# Patient Record
Sex: Male | Born: 1945
Health system: Southern US, Community
[De-identification: ages and names within clinical notes are randomized; demographics above are authoritative.]

## PROBLEM LIST (undated history)

## (undated) DIAGNOSIS — K219 Gastro-esophageal reflux disease without esophagitis: Secondary | ICD-10-CM

## (undated) DIAGNOSIS — M549 Dorsalgia, unspecified: Secondary | ICD-10-CM

## (undated) DIAGNOSIS — E43 Unspecified severe protein-calorie malnutrition: Secondary | ICD-10-CM

## (undated) DIAGNOSIS — G8929 Other chronic pain: Secondary | ICD-10-CM

## (undated) DIAGNOSIS — K269 Duodenal ulcer, unspecified as acute or chronic, without hemorrhage or perforation: Secondary | ICD-10-CM

## (undated) DIAGNOSIS — A048 Other specified bacterial intestinal infections: Secondary | ICD-10-CM

## (undated) DIAGNOSIS — IMO0002 Reserved for concepts with insufficient information to code with codable children: Secondary | ICD-10-CM

## (undated) DIAGNOSIS — C159 Malignant neoplasm of esophagus, unspecified: Secondary | ICD-10-CM

## (undated) DIAGNOSIS — I10 Essential (primary) hypertension: Secondary | ICD-10-CM

## (undated) HISTORY — DX: Reserved for concepts with insufficient information to code with codable children: IMO0002

## (undated) HISTORY — PX: REMOVAL OF GASTROSTOMY TUBE: SHX6058

## (undated) HISTORY — DX: Malignant neoplasm of esophagus, unspecified: C15.9

## (undated) HISTORY — DX: Other chronic pain: G89.29

## (undated) HISTORY — DX: Dorsalgia, unspecified: M54.9

## (undated) HISTORY — PX: PORTACATH PLACEMENT: SHX2246

---

## 2001-11-27 ENCOUNTER — Encounter: Payer: Self-pay | Admitting: Emergency Medicine

## 2001-11-27 ENCOUNTER — Emergency Department (HOSPITAL_COMMUNITY): Admission: EM | Admit: 2001-11-27 | Discharge: 2001-11-27 | Payer: Self-pay | Admitting: Emergency Medicine

## 2012-07-16 ENCOUNTER — Encounter (HOSPITAL_COMMUNITY): Payer: Self-pay | Admitting: *Deleted

## 2012-07-16 ENCOUNTER — Emergency Department (HOSPITAL_COMMUNITY)
Admission: EM | Admit: 2012-07-16 | Discharge: 2012-07-16 | Disposition: A | Discharge status: Home / Self Care | Payer: Medicare Other | Attending: Emergency Medicine | Admitting: Emergency Medicine

## 2012-07-16 ENCOUNTER — Emergency Department (HOSPITAL_COMMUNITY): Payer: Medicare Other

## 2012-07-16 DIAGNOSIS — S27819A Unspecified injury of esophagus (thoracic part), initial encounter: Secondary | ICD-10-CM

## 2012-07-16 DIAGNOSIS — I10 Essential (primary) hypertension: Secondary | ICD-10-CM | POA: Insufficient documentation

## 2012-07-16 DIAGNOSIS — R05 Cough: Secondary | ICD-10-CM | POA: Insufficient documentation

## 2012-07-16 DIAGNOSIS — F17200 Nicotine dependence, unspecified, uncomplicated: Secondary | ICD-10-CM | POA: Insufficient documentation

## 2012-07-16 DIAGNOSIS — R059 Cough, unspecified: Secondary | ICD-10-CM | POA: Insufficient documentation

## 2012-07-16 DIAGNOSIS — T07XXXA Unspecified multiple injuries, initial encounter: Secondary | ICD-10-CM | POA: Insufficient documentation

## 2012-07-16 DIAGNOSIS — X58XXXA Exposure to other specified factors, initial encounter: Secondary | ICD-10-CM | POA: Insufficient documentation

## 2012-07-16 HISTORY — DX: Essential (primary) hypertension: I10

## 2012-07-16 MED ORDER — FAMOTIDINE 20 MG PO TABS: 20.0000 mg | ORAL_TABLET | Freq: Two times a day (BID) | ORAL | Status: DC

## 2012-07-16 MED ORDER — AZITHROMYCIN 250 MG PO TABS: ORAL_TABLET | ORAL | Status: DC

## 2012-07-16 MED ORDER — PANTOPRAZOLE SODIUM 40 MG PO TBEC
40.0000 mg | DELAYED_RELEASE_TABLET | Freq: Once | ORAL | Status: AC
  Administered 2012-07-16: 40 mg via ORAL
  Filled 2012-07-16: qty 1

## 2012-07-16 NOTE — ED Notes (Signed)
Cough, congestion for 2 weeks,  Had a piece of meat hung in throat today, started coughing and "coughed up blood".

## 2012-07-21 NOTE — ED Provider Notes (Signed)
History     CSN: [id number]  Arrival date & time 07/16/12  1433   First MD Initiated Contact with Patient 07/16/12 1548      Chief Complaint  Patient presents with  . Cough    (Consider location/radiation/quality/duration/timing/severity/associated sxs/prior treatment) HPI Comments: Patient c/o intermittent cough and chest congestion for 2 weeks.  States cough is occasionally productive of yellowish sputum.  He also states that earlier on the day of arrival,  He was eating a piece of meat when he began coughing and the meat became lodged in his throat.  States he was able to finally swallow it but noticed a brief episode of bright red blood during the coughing.  He denies previous hemoptysis, fever, chills, shortness of breath,  night sweats or wt loss.  Also denies abd or chest pain.    Patient is a 66 y.o. male presenting with cough. The history is provided by the patient.  Cough This is a new problem. The current episode started more than 1 week ago. The problem occurs every few hours. The problem has not changed since onset.The cough is productive of sputum. There has been no fever. Associated symptoms include rhinorrhea. Pertinent negatives include no chest pain, no chills, no sweats, no ear congestion, no ear pain, no headaches, no sore throat, no myalgias, no shortness of breath, no wheezing and no eye redness. He has tried nothing for the symptoms. The treatment provided no relief. He is a smoker. His past medical history does not include bronchitis, pneumonia, emphysema or asthma.    Past Medical History  Diagnosis Date  . Hypertension     History reviewed. No pertinent past surgical history.  History reviewed. No pertinent family history.  History  Substance Use Topics  . Smoking status: Current Everyday Smoker  . Smokeless tobacco: Not on file  . Alcohol Use: No      Review of Systems  Constitutional: Negative for fever, chills, activity change and appetite change.   HENT: Positive for rhinorrhea. Negative for ear pain, sore throat, trouble swallowing, neck pain, neck stiffness and voice change.   Eyes: Negative for redness.  Respiratory: Positive for cough. Negative for chest tightness, shortness of breath, wheezing and stridor.   Cardiovascular: Negative for chest pain.  Gastrointestinal: Negative for nausea, vomiting and abdominal pain.  Musculoskeletal: Negative for myalgias.  Skin: Negative.   Neurological: Negative for dizziness, weakness, numbness and headaches.  All other systems reviewed and are negative.    Allergies  Motrin  Home Medications   Current Outpatient Rx  Name Route Sig Dispense Refill  . AZITHROMYCIN 250 MG PO TABS  Take two tablets on day one, then one tab qd days 2-5 6 tablet 0  . FAMOTIDINE 20 MG PO TABS Oral Take 1 tablet (20 mg total) by mouth 2 (two) times daily. For 5 days 10 tablet 0    BP 143/80  Pulse 109  Temp 98.5 F (36.9 C) (Oral)  Resp 20  Ht 5\' 6"  (1.676 m)  Wt 116 lb 1 oz (52.646 kg)  BMI 18.73 kg/m2  SpO2 100%  Physical Exam  Nursing note and vitals reviewed. Constitutional: He is oriented to person, place, and time. He appears well-developed and well-nourished. No distress.  HENT:  Head: Normocephalic and atraumatic.  Right Ear: Tympanic membrane and ear canal normal.  Left Ear: Tympanic membrane and ear canal normal.  Mouth/Throat: Uvula is midline, oropharynx is clear and moist and mucous membranes are normal.  Neck: Normal range  of motion. Neck supple.  Cardiovascular: Normal rate, regular rhythm, normal heart sounds and intact distal pulses.   No murmur heard. Pulmonary/Chest: Effort normal and breath sounds normal. No respiratory distress. He has no wheezes. He has no rales. He exhibits no tenderness.  Abdominal: Soft. Bowel sounds are normal. He exhibits no distension. There is no tenderness.  Musculoskeletal: He exhibits no edema.  Neurological: He is alert and oriented to person,  place, and time. Coordination normal.  Skin: Skin is warm and dry.    ED Course  Procedures (including critical care time)  Labs Reviewed - No data to display No results found.   1. Cough   2. Esophageal injury       MDM   Pt is resting comfortably. NAD.  Vitals stable.  Probable spontaneous resolution of food bolus PTA to ED.  Pt now able to drink fluids w/o difficulty , no vomiting.  Pt is well appearing.     I have discussed pt hx and care plan with EDP prior to d/c.  Pt agrees to return to ED if sx's worsen or to f/u with his PMD  The patient appears reasonably screened and/or stabilized for discharge and I doubt any other medical condition or other [facility] requiring further screening, evaluation, or treatment in the ED at this time prior to discharge.     Madina Galati L. [place], [place] 07/21/12 2201

## 2012-07-22 NOTE — ED Provider Notes (Signed)
Medical screening examination/treatment/procedure(s) were performed by non-physician practitioner and as supervising physician I was immediately available for consultation/collaboration.   Shelda Jakes, MD 07/22/12 [phone]

## 2012-07-27 ENCOUNTER — Ambulatory Visit (INDEPENDENT_AMBULATORY_CARE_PROVIDER_SITE_OTHER): Payer: Medicare Other | Admitting: Family Medicine

## 2012-07-27 ENCOUNTER — Encounter: Payer: Self-pay | Admitting: Family Medicine

## 2012-07-27 VITALS — BP 150/90 | HR 80 | Resp 16 | Ht 66.0 in | Wt 117.8 lb

## 2012-07-27 DIAGNOSIS — I10 Essential (primary) hypertension: Secondary | ICD-10-CM

## 2012-07-27 DIAGNOSIS — R1310 Dysphagia, unspecified: Secondary | ICD-10-CM

## 2012-07-27 DIAGNOSIS — J438 Other emphysema: Secondary | ICD-10-CM

## 2012-07-27 DIAGNOSIS — Z720 Tobacco use: Secondary | ICD-10-CM

## 2012-07-27 DIAGNOSIS — Z7289 Other problems related to lifestyle: Secondary | ICD-10-CM

## 2012-07-27 DIAGNOSIS — J439 Emphysema, unspecified: Secondary | ICD-10-CM

## 2012-07-27 DIAGNOSIS — F17200 Nicotine dependence, unspecified, uncomplicated: Secondary | ICD-10-CM

## 2012-07-27 DIAGNOSIS — Z789 Other specified health status: Secondary | ICD-10-CM

## 2012-07-27 MED ORDER — OMEPRAZOLE 20 MG PO CPDR: 20.0000 mg | DELAYED_RELEASE_CAPSULE | Freq: Every day | ORAL | Status: DC

## 2012-07-27 NOTE — Progress Notes (Signed)
  Subjective:    Patient ID: Eddie [name], male    DOB: 1946/07/03, 66 y.o.   MRN: [id number]  HPI Patient presents to establish care. His last PCP was Dr. Sudie Bailey but this was greater than 6 years ago. He was seen in the emergency room secondary to cough and hemoptysis also difficulty swallowing. He was prescribed Z-Pak for cough as x-ray indicated emphysema changes. He was also given Pepcid. He has been on a soft diet for the past few days which hasn't proved his swallowing. He denies any further hemoptysis. He does continue to have difficulty with swallowing especially meats.    Review of Systems   GEN- denies fatigue, fever, weight loss,weakness, recent illness HEENT- denies eye drainage, change in vision, nasal discharge, CVS- denies chest pain, palpitations RESP- denies SOB, cough, wheeze ABD- denies N/V, change in stools, abd pain GU- denies dysuria, hematuria, dribbling, incontinence MSK- denies joint pain, muscle aches, injury Neuro- denies headache, dizziness, syncope, seizure activity      Objective:   Physical Exam GEN- NAD, alert and oriented x3, thin male HEENT- PERRL, EOMI, non injected sclera, pink conjunctiva, MMM, oropharynx clear Neck- Supple CVS- RRR, no murmur RESP-CTAB ABD-NABS,soft,NT,ND EXT- No edema Pulses- Radial, DP- 2+ Skin- dime size inclusion cyst between brow Psych-normal affect and mood        Assessment & Plan:

## 2012-07-27 NOTE — Patient Instructions (Addendum)
Get the labs done next week, do not eat after midnight Take the acid blocker once a day  Referral to gastroenterologist for your swallowing  We will call with results of your test  F/U  6 weeks for blood pressure

## 2012-07-29 ENCOUNTER — Encounter: Payer: Self-pay | Admitting: Family Medicine

## 2012-07-29 DIAGNOSIS — R1310 Dysphagia, unspecified: Secondary | ICD-10-CM | POA: Insufficient documentation

## 2012-07-29 DIAGNOSIS — Z720 Tobacco use: Secondary | ICD-10-CM | POA: Insufficient documentation

## 2012-07-29 DIAGNOSIS — I10 Essential (primary) hypertension: Secondary | ICD-10-CM | POA: Insufficient documentation

## 2012-07-29 DIAGNOSIS — Z789 Other specified health status: Secondary | ICD-10-CM | POA: Insufficient documentation

## 2012-07-29 DIAGNOSIS — Z7289 Other problems related to lifestyle: Secondary | ICD-10-CM | POA: Insufficient documentation

## 2012-07-29 DIAGNOSIS — J439 Emphysema, unspecified: Secondary | ICD-10-CM | POA: Insufficient documentation

## 2012-07-29 NOTE — Assessment & Plan Note (Signed)
Will monitor, no meds currently

## 2012-07-29 NOTE — Assessment & Plan Note (Signed)
Referral to GI, continue PPI

## 2012-07-29 NOTE — Assessment & Plan Note (Signed)
I am concerned he is not letting on to how much alcohol he really drinks, very thin male, he cares for his wife as well

## 2012-07-29 NOTE — Assessment & Plan Note (Signed)
Will need PFT done, discussed importance of smoking cessation, no meds at this time

## 2012-07-30 LAB — CBC WITH DIFFERENTIAL/PLATELET
Basophils Absolute: 0.1 10*3/uL (ref 0.0–0.1)
Basophils Relative: 1 % (ref 0–1)
Eosinophils Absolute: 0.2 10*3/uL (ref 0.0–0.7)
Eosinophils Relative: 1 % (ref 0–5)
HCT: 42.3 % (ref 39.0–52.0)
Hemoglobin: 14 g/dL (ref 13.0–17.0)
Lymphocytes Relative: 21 % (ref 12–46)
Lymphs Abs: 2.7 10*3/uL (ref 0.7–4.0)
MCH: 31.7 pg (ref 26.0–34.0)
MCHC: 33.1 g/dL (ref 30.0–36.0)
MCV: 95.7 fL (ref 78.0–100.0)
Monocytes Absolute: 0.9 10*3/uL (ref 0.1–1.0)
Monocytes Relative: 7 % (ref 3–12)
Neutro Abs: 9 10*3/uL — ABNORMAL HIGH (ref 1.7–7.7)
Neutrophils Relative %: 70 % (ref 43–77)
Platelets: 322 10*3/uL (ref 150–400)
RBC: 4.42 MIL/uL (ref 4.22–5.81)
RDW: 12.1 % (ref 11.5–15.5)
WBC: 12.9 10*3/uL — ABNORMAL HIGH (ref 4.0–10.5)

## 2012-07-30 LAB — COMPREHENSIVE METABOLIC PANEL
ALT: <8 U/L (ref 0–53)
AST: 11 U/L (ref 0–37)
Albumin: 4.2 g/dL (ref 3.5–5.2)
Alkaline Phosphatase: 70 U/L (ref 39–117)
BUN: 13 mg/dL (ref 6–23)
CO2: 24 mEq/L (ref 19–32)
Calcium: 9.8 mg/dL (ref 8.4–10.5)
Chloride: 108 mEq/L (ref 96–112)
Creat: 1.12 mg/dL (ref 0.50–1.35)
Glucose, Bld: 93 mg/dL (ref 70–99)
Potassium: 4.3 mEq/L (ref 3.5–5.3)
Sodium: 143 mEq/L (ref 135–145)
Total Bilirubin: 1.6 mg/dL — ABNORMAL HIGH (ref 0.3–1.2)
Total Protein: 6.9 g/dL (ref 6.0–8.3)

## 2012-07-30 LAB — LIPID PANEL
Cholesterol: 148 mg/dL (ref 0–200)
HDL: 54 mg/dL (ref >39)
LDL Cholesterol: 76 mg/dL (ref 0–99)
Total CHOL/HDL Ratio: 2.7 Ratio
Triglycerides: 91 mg/dL (ref <150)
VLDL: 18 mg/dL (ref 0–40)

## 2012-08-22 ENCOUNTER — Encounter (INDEPENDENT_AMBULATORY_CARE_PROVIDER_SITE_OTHER): Payer: Self-pay | Admitting: Internal Medicine

## 2012-08-22 ENCOUNTER — Ambulatory Visit (INDEPENDENT_AMBULATORY_CARE_PROVIDER_SITE_OTHER): Payer: Medicare Other | Admitting: Internal Medicine

## 2012-08-22 ENCOUNTER — Other Ambulatory Visit (INDEPENDENT_AMBULATORY_CARE_PROVIDER_SITE_OTHER): Payer: Self-pay | Admitting: *Deleted

## 2012-08-22 ENCOUNTER — Encounter (INDEPENDENT_AMBULATORY_CARE_PROVIDER_SITE_OTHER): Payer: Self-pay | Admitting: *Deleted

## 2012-08-22 VITALS — BP 122/74 | HR 56 | Temp 98.3°F | Ht 66.0 in | Wt 116.3 lb

## 2012-08-22 DIAGNOSIS — R1310 Dysphagia, unspecified: Secondary | ICD-10-CM

## 2012-08-22 MED ORDER — OMEPRAZOLE 40 MG PO CPDR: 40.0000 mg | DELAYED_RELEASE_CAPSULE | Freq: Every day | ORAL | Status: DC

## 2012-08-22 NOTE — Progress Notes (Signed)
Subjective:     Patient ID: Eddie [name], male   DOB: October 10, 1946, 66 y.o.   MRN: [id number]  HPILeon is a 66 yr old male presenting with c/o dysphagia. He tells me about 3 weeks ago he was eating meat and potatoes and the food lodged. He had to cough the food up. He says he also saw blood during this incidence. No blood since. No problems with liquids.  He says he is having problems with all foods going down.  Foods are slow to go down. Symptoms x  6 months.  Appetite is good. Weight loss of 20 pounds over 6 months period.  BM x 1 a day, sometimes  2 a day. No melena or bright red rectal bleeding.  Review of Systemssee hpi Current Outpatient Prescriptions  Medication Sig Dispense Refill  . omeprazole (PRILOSEC) 20 MG capsule Take 1 capsule (20 mg total) by mouth daily.  30 capsule  2   Past Medical History  Diagnosis Date  . Hypertension   . Chronic back pain    History reviewed. No pertinent past surgical history. Family Status  Relation Status Death Age  . Mother Deceased   . Father Alive   . Sister Alive   . Brother Alive   . Sister Alive   . Sister Alive   . Sister Alive   . Brother Deceased     Anuserym   History   Social History  . Marital Status: Married    Spouse Name: N/A    Number of Children: N/A  . Years of Education: N/A   Occupational History  . Not on file.   Social History Main Topics  . Smoking status: Current Every Day Smoker -- 0.5 packs/day  . Smokeless tobacco: Not on file   Comment: 1/2 to 1 pack x50 yrs.  . Alcohol Use: 3.6 oz/week    6 Shots of liquor per week     twice a week  . Drug Use: No  . Sexually Active: Not on file   Other Topics Concern  . Not on file   Social History Narrative  . No narrative on file   Allergies  Allergen Reactions  . Motrin (Ibuprofen)         Objective:   Physical Exam Filed Vitals:   08/22/12 1005  BP: 122/74  Pulse: 56  Temp: 98.3 F (36.8 C)  Height: 5\' 6"  (1.676 m)  Weight: 116 lb 4.8 oz  (52.753 kg)   Alert and oriented. Skin warm and dry. Oral mucosa is moist.   . Sclera anicteric, conjunctivae is pink. Thyroid not enlarged. No cervical lymphadenopathy. Lungs clear. Heart regular rate and rhythm.  Abdomen is soft. Bowel sounds are positive. No hepatomegaly. No abdominal masses felt. No tenderness.  No edema to lower extremities.       Assessment:    Solid food dysphagia. Esophageal stricture needs to be ruled out.    Plan:     EGD/ED with Dr. Karilyn Cota. The risks and benefits such as perforation, bleeding, and infection were reviewed with the patient and is agreeable.    Omeprazole 40mg  daily 30 minutes before breakfast.  NO meats or breads.  Try to stay on a soft diet.

## 2012-08-22 NOTE — Patient Instructions (Addendum)
EGD/ED. The risks and benefits such as perforation, bleeding, and infection were reviewed with the patient and is agreeable. 

## 2012-09-04 DIAGNOSIS — A048 Other specified bacterial intestinal infections: Secondary | ICD-10-CM

## 2012-09-04 DIAGNOSIS — K269 Duodenal ulcer, unspecified as acute or chronic, without hemorrhage or perforation: Secondary | ICD-10-CM

## 2012-09-04 HISTORY — DX: Duodenal ulcer, unspecified as acute or chronic, without hemorrhage or perforation: K26.9

## 2012-09-04 HISTORY — DX: Other specified bacterial intestinal infections: A04.8

## 2012-09-07 ENCOUNTER — Ambulatory Visit (INDEPENDENT_AMBULATORY_CARE_PROVIDER_SITE_OTHER): Payer: Medicare Other | Admitting: Family Medicine

## 2012-09-07 ENCOUNTER — Encounter: Payer: Self-pay | Admitting: Family Medicine

## 2012-09-07 VITALS — BP 148/98 | HR 72 | Resp 15 | Ht 66.0 in | Wt 112.1 lb

## 2012-09-07 DIAGNOSIS — I10 Essential (primary) hypertension: Secondary | ICD-10-CM

## 2012-09-07 MED ORDER — LISINOPRIL-HYDROCHLOROTHIAZIDE 10-12.5 MG PO TABS: 1.0000 | ORAL_TABLET | Freq: Every day | ORAL | Status: DC

## 2012-09-07 NOTE — Patient Instructions (Signed)
Flu shot given  Start new blood pressure medication once a day  F/U for your endoscopy F/U 2 months

## 2012-09-07 NOTE — Progress Notes (Signed)
  Subjective:    Patient ID: Eddie [name], male    DOB: 09-17-1946, 66 y.o.   MRN: [id number]  HPI Patient here to Followup hypertension. He was previously on blood pressure medication some years ago. He's been evaluated by GI for his dysphasia or weight loss. He still unable to eat very many foods. He is EGD scheduled for next Thursday.  He would like to have his dysphasia evaluated before having any other workup done Review of Systems  GEN- denies fatigue, fever, weight loss,weakness, recent illness HEENT- denies eye drainage, change in vision, nasal discharge, CVS- denies chest pain, palpitations RESP- denies SOB, cough, wheeze ABD- denies N/V, change in stools, abd pain Neuro- denies headache, dizziness, syncope, seizure activity      Objective:   Physical Exam  GEN- NAD, alert and oriented x3, thin male HEENT- PERRL, EOMI, non injected sclera, pink conjunctiva, MMM, oropharynx clear CVS- RRR, no murmur RESP-CTAB EXT- No edema Pulses- Radial 2+      Assessment & Plan:

## 2012-09-07 NOTE — Assessment & Plan Note (Signed)
Start HCTZ lisinopril, labs reviewed, FLP looks good, normal renal function

## 2012-09-10 ENCOUNTER — Encounter (HOSPITAL_COMMUNITY): Payer: Self-pay | Admitting: Pharmacy Technician

## 2012-09-12 MED ORDER — SODIUM CHLORIDE 0.45 % IV SOLN
INTRAVENOUS | Status: DC
  Administered 2012-09-13: 13:00:00 via INTRAVENOUS

## 2012-09-13 ENCOUNTER — Other Ambulatory Visit (INDEPENDENT_AMBULATORY_CARE_PROVIDER_SITE_OTHER): Payer: Self-pay | Admitting: Internal Medicine

## 2012-09-13 ENCOUNTER — Encounter (HOSPITAL_COMMUNITY): Payer: Self-pay | Admitting: *Deleted

## 2012-09-13 ENCOUNTER — Ambulatory Visit (HOSPITAL_COMMUNITY): Payer: Medicare Other

## 2012-09-13 ENCOUNTER — Encounter (HOSPITAL_COMMUNITY)
Admission: RE | Disposition: A | Discharge status: Home / Self Care | Payer: Self-pay | Source: Ambulatory Visit | Attending: Internal Medicine

## 2012-09-13 ENCOUNTER — Ambulatory Visit (HOSPITAL_COMMUNITY)
Admission: RE | Admit: 2012-09-13 | Discharge: 2012-09-13 | Disposition: A | Discharge status: Home / Self Care | Payer: Medicare Other | Source: Ambulatory Visit | Attending: Internal Medicine | Admitting: Internal Medicine

## 2012-09-13 DIAGNOSIS — R042 Hemoptysis: Secondary | ICD-10-CM

## 2012-09-13 DIAGNOSIS — R634 Abnormal weight loss: Secondary | ICD-10-CM

## 2012-09-13 DIAGNOSIS — K228 Other specified diseases of esophagus: Secondary | ICD-10-CM

## 2012-09-13 DIAGNOSIS — C155 Malignant neoplasm of lower third of esophagus: Secondary | ICD-10-CM | POA: Insufficient documentation

## 2012-09-13 DIAGNOSIS — R1310 Dysphagia, unspecified: Secondary | ICD-10-CM

## 2012-09-13 DIAGNOSIS — K222 Esophageal obstruction: Secondary | ICD-10-CM

## 2012-09-13 DIAGNOSIS — Z79899 Other long term (current) drug therapy: Secondary | ICD-10-CM | POA: Insufficient documentation

## 2012-09-13 DIAGNOSIS — I10 Essential (primary) hypertension: Secondary | ICD-10-CM | POA: Insufficient documentation

## 2012-09-13 DIAGNOSIS — K2980 Duodenitis without bleeding: Secondary | ICD-10-CM

## 2012-09-13 DIAGNOSIS — R1906 Epigastric swelling, mass or lump: Secondary | ICD-10-CM

## 2012-09-13 HISTORY — DX: Gastro-esophageal reflux disease without esophagitis: K21.9

## 2012-09-13 SURGERY — ESOPHAGOGASTRODUODENOSCOPY (EGD) WITH ESOPHAGEAL DILATION
Anesthesia: Moderate Sedation

## 2012-09-13 MED ORDER — BUTAMBEN-TETRACAINE-BENZOCAINE 2-2-14 % EX AERO
INHALATION_SPRAY | CUTANEOUS | Status: DC | PRN
  Administered 2012-09-13: 2 via TOPICAL

## 2012-09-13 MED ORDER — MIDAZOLAM HCL 5 MG/5ML IJ SOLN
INTRAMUSCULAR | Status: DC | PRN
  Administered 2012-09-13: 1 mg via INTRAVENOUS
  Administered 2012-09-13 (×3): 2 mg via INTRAVENOUS

## 2012-09-13 MED ORDER — STERILE WATER FOR IRRIGATION IR SOLN
Status: DC | PRN
  Administered 2012-09-13: 14:00:00

## 2012-09-13 MED ORDER — IOHEXOL 300 MG/ML  SOLN
40.0000 mL | Freq: Once | INTRAMUSCULAR | Status: AC | PRN
  Administered 2012-09-13: 40 mL via INTRAVENOUS

## 2012-09-13 MED ORDER — MEPERIDINE HCL 25 MG/ML IJ SOLN
INTRAMUSCULAR | Status: DC | PRN
  Administered 2012-09-13 (×2): 25 mg via INTRAVENOUS

## 2012-09-13 MED ORDER — MEPERIDINE HCL 50 MG/ML IJ SOLN
INTRAMUSCULAR | Status: AC
  Filled 2012-09-13: qty 1

## 2012-09-13 MED ORDER — IOHEXOL 300 MG/ML  SOLN
100.0000 mL | Freq: Once | INTRAMUSCULAR | Status: AC | PRN
  Administered 2012-09-13: 100 mL via INTRAVENOUS

## 2012-09-13 MED ORDER — MIDAZOLAM HCL 5 MG/5ML IJ SOLN
INTRAMUSCULAR | Status: AC
  Filled 2012-09-13: qty 10

## 2012-09-13 NOTE — H&P (Signed)
Eddie [name] is an 66 y.o. male.   Chief Complaint: Patient is therefore EGD and ED. HPI: She is 66 year old African American male who presents with 2 month history of dysphagia to solids. He points to upper sternal/supra-clavicular area side abortus obstruction. He has no difficulty with liquids. He has been on PPI for the last few weeks and his heartburn is well controlled. He has good appetite but unable to swallow. He has lost 20 pounds since his dysphagia started. He denies abdominal pain melena or diarrhea. He does not take any NSAIDs. Drinks alcohol socially but not daily. He smokes pack of cigarettes per day.  Past Medical History  Diagnosis Date  . Hypertension   . Chronic back pain   . GERD (gastroesophageal reflux disease)     History reviewed. No pertinent past surgical history.  Family History  Problem Relation Age of Onset  . Stroke Mother   . Hypertension Father   . Cancer Brother     Throat cancer  . Kidney disease Daughter    Social History:  reports that he has been smoking.  He does not have any smokeless tobacco history on file. He reports that he drinks about 3.6 ounces of alcohol per week. He reports that he does not use illicit drugs.  Allergies:  Allergies  Allergen Reactions  . Motrin (Ibuprofen) Swelling    Medications Prior to Admission  Medication Sig Dispense Refill  . lisinopril-hydrochlorothiazide (ZESTORETIC) 10-12.5 MG per tablet Take 1 tablet by mouth daily.  30 tablet  3  . omeprazole (PRILOSEC) 40 MG capsule Take 1 capsule (40 mg total) by mouth daily.  30 capsule  3    No results found for this or any previous visit (from the past 48 hour(s)). No results found.  ROS  Blood pressure 159/100, pulse 87, temperature 98 F (36.7 C), temperature source Oral, resp. rate 22, height 5\' 6"  (1.676 m), weight 112 lb (50.803 kg), SpO2 99.00%. Physical Exam  Constitutional: He appears well-developed.       Thin African American male in NAD  HENT:    Mouth/Throat: Oropharynx is clear and moist.       Small sebaceous cyst at base of nose. He is edentulous  Eyes: Conjunctivae normal are normal. No scleral icterus.  Neck: No thyromegaly present.  Cardiovascular: Normal rate, regular rhythm and normal heart sounds.   No murmur heard. Respiratory: Effort normal and breath sounds normal.  GI: Soft. He exhibits no distension and no mass. There is no tenderness.  Musculoskeletal: He exhibits no edema.  Lymphadenopathy:    He has no cervical adenopathy.  Skin: Skin is warm.     Assessment/Plan Solid food dysphagia. Weight loss. EGD and ED.  Eddie [name] U 09/13/2012, 1:37 PM

## 2012-09-13 NOTE — Op Note (Signed)
EGD PROCEDURE REPORT  PATIENT:  Eddie [name]  MR#:  [id number] Birthdate:  1946/10/12, 66 y.o., male Endoscopist:  Dr. Malissa Hippo, MD Referred By:  Dr. Bonnetta Barry ref. provider found Procedure Date: 09/13/2012  Procedure:   EGD  Indications:  Patient is 66 year old African American male with few month history of dysphagia to solids associated with 20 pound weight loss. He is undergoing diagnostic and possibly therapeutic EGD.            Informed Consent:  The risks, benefits, alternatives & imponderables which include, but are not limited to, bleeding, infection, perforation, drug reaction and potential missed lesion have been reviewed.  The potential for biopsy, lesion removal, esophageal dilation, etc. have also been discussed.  Questions have been answered.  All parties agreeable.  Please see history & physical in medical record for more information.  Medications:  Demerol 50 mg IV Versed 7 mg IV Cetacaine spray topically for oropharyngeal anesthesia  Description of procedure:  The endoscope was introduced through the mouth and advanced to the second portion of the duodenum without difficulty or limitations. The mucosal surfaces were surveyed very carefully during advancement of the scope and upon withdrawal.  Findings:  Esophagus:  Short segment of esophagus below the UES reveals normal mucosa and was somewhat dilated. For a portal friable mass extended from 20 cm down to 25 cm from the incisors. Slowly she would appear to be endomural with stricture with inability to pass Q-scope. Therefore a 7 mm scope was used. Mucosa of distal two thirds was normal. GEJ:  40 cm Stomach:  Stomach was empty and distended very well with insufflation. Folds in the proximal stomach were normal. Examination of mucosa at body, antrum, pyloric channel, angularis, fundus and cardia was normal. Duodenum:  Few bulbar erosions noted. Post bulbar mucosa was normal.  Therapeutic/Diagnostic Maneuvers Performed:   Esophageal lesion was biopsied for routine histology.  Complications:  None  Impression: 5 cm long esophageal mass with poorly portal appearance proximally endomural involvement with stricture a long passage of 7 mm scope. This lesion was biopsied for histology. Bulbar duodenitis.  Recommendations:  H. pylori serology. Chest and abdominopelvic CT with contrast ASAP. Further recommendations to follow. He possibly will will need PEG.  Isaly Fasching U  09/13/2012  2:12 PM  CC: Dr. Milinda Antis, MD & Dr. Bonnetta Barry ref. provider found

## 2012-09-14 LAB — H. PYLORI ANTIBODY, IGG: H Pylori IgG: 2.38 {ISR} — ABNORMAL HIGH

## 2012-09-17 ENCOUNTER — Encounter: Payer: Self-pay | Admitting: Family Medicine

## 2012-09-20 ENCOUNTER — Encounter (HOSPITAL_COMMUNITY): Payer: Medicare Other | Attending: Oncology

## 2012-09-20 ENCOUNTER — Encounter (HOSPITAL_COMMUNITY): Payer: Self-pay | Admitting: Pharmacy Technician

## 2012-09-20 ENCOUNTER — Other Ambulatory Visit (HOSPITAL_COMMUNITY): Payer: Self-pay | Admitting: Oncology

## 2012-09-20 ENCOUNTER — Telehealth (INDEPENDENT_AMBULATORY_CARE_PROVIDER_SITE_OTHER): Payer: Self-pay | Admitting: *Deleted

## 2012-09-20 ENCOUNTER — Other Ambulatory Visit (INDEPENDENT_AMBULATORY_CARE_PROVIDER_SITE_OTHER): Payer: Self-pay | Admitting: *Deleted

## 2012-09-20 DIAGNOSIS — C159 Malignant neoplasm of esophagus, unspecified: Secondary | ICD-10-CM | POA: Insufficient documentation

## 2012-09-20 DIAGNOSIS — R1310 Dysphagia, unspecified: Secondary | ICD-10-CM | POA: Insufficient documentation

## 2012-09-20 DIAGNOSIS — F17200 Nicotine dependence, unspecified, uncomplicated: Secondary | ICD-10-CM | POA: Insufficient documentation

## 2012-09-20 DIAGNOSIS — R64 Cachexia: Secondary | ICD-10-CM | POA: Insufficient documentation

## 2012-09-20 LAB — COMPREHENSIVE METABOLIC PANEL
ALT: 5 U/L (ref 0–53)
AST: 10 U/L (ref 0–37)
CO2: 19 mEq/L (ref 19–32)
Chloride: 95 mEq/L — ABNORMAL LOW (ref 96–112)
Creatinine, Ser: 2.68 mg/dL — ABNORMAL HIGH (ref 0.50–1.35)
GFR calc non Af Amer: 24 mL/min — ABNORMAL LOW (ref >90)
Total Bilirubin: 1.4 mg/dL — ABNORMAL HIGH (ref 0.3–1.2)

## 2012-09-20 LAB — CBC WITH DIFFERENTIAL/PLATELET
Basophils Absolute: 0.1 10*3/uL (ref 0.0–0.1)
HCT: 46.5 % (ref 39.0–52.0)
Hemoglobin: 15.5 g/dL (ref 13.0–17.0)
Lymphocytes Relative: 11 % — ABNORMAL LOW (ref 12–46)
Monocytes Absolute: 1.7 10*3/uL — ABNORMAL HIGH (ref 0.1–1.0)
Neutro Abs: 15.1 10*3/uL — ABNORMAL HIGH (ref 1.7–7.7)
RBC: 4.77 MIL/uL (ref 4.22–5.81)
RDW: 11.9 % (ref 11.5–15.5)
WBC: 19 10*3/uL — ABNORMAL HIGH (ref 4.0–10.5)

## 2012-09-20 MED ORDER — HEPARIN SOD (PORK) LOCK FLUSH 100 UNIT/ML IV SOLN
INTRAVENOUS | Status: AC
  Filled 2012-09-20: qty 5

## 2012-09-20 MED ORDER — SODIUM CHLORIDE 0.9 % IV SOLN
INTRAVENOUS | Status: DC
  Administered 2012-09-20: 12:00:00 via INTRAVENOUS

## 2012-09-20 MED ORDER — SODIUM CHLORIDE 0.9 % IV SOLN: INTRAVENOUS | Status: DC

## 2012-09-20 MED ORDER — SODIUM CHLORIDE 0.45 % IV SOLN
INTRAVENOUS | Status: DC
  Administered 2012-09-21: 13:00:00 via INTRAVENOUS

## 2012-09-20 NOTE — Patient Instructions (Addendum)
[facility]  Discharge Instructions You received IV  fluids today.   SPECIAL INSTRUCTIONS/FOLLOW-UP: You can have clear liquids only from 8pm tonight until 1030 am tomorrow. After 1030 am tomorrow nothing to eat or drink. [facility] Short Stay Oct 18th (Friday) at 245 pm for PEG tube placement   I acknowledge that I have been informed and understand all the instructions given to me and received a copy. I do not have any more questions at this time, but understand that I may call the Specialty Clinic at [facility] [place] at [phone] during business hours should I have any further questions or need assistance in obtaining follow-up care.    __________________________________________  _____________  __________ Signature of Patient or Authorized Representative            Date                   Time    __________________________________________ Nurse's Signature

## 2012-09-20 NOTE — Progress Notes (Signed)
IV wrapped with guaze for infusion tomorrow.  Patient informed of appointment tomorrow and states that he understands and will return in the am.

## 2012-09-20 NOTE — Telephone Encounter (Signed)
I called Eddie [name]. She states that after Dr.Rehman spoke with Dr.Neijstrom the patient was given an appointment for 09-25-12 as a new Cancer Patient. The patient  has not be able to eat in 5 days, only enough water to wet mouth, this is due to dysphagia. Tom @ the [facility] has ask that the patient come there for fluids and labs. He has ask that the surgical consult , ect be made to move forward with the PEG tube placement. Dr.Rehman was paged.

## 2012-09-20 NOTE — Telephone Encounter (Signed)
Per Dr.Rehman arrange for PEG to be done tomorrow 09/21/12. Forwarded to Dewayne Hatch to arrange Call Hailey to make her aware

## 2012-09-20 NOTE — Telephone Encounter (Signed)
Patient is coming in the office for IV fluids and blood work. He has not eaten in 5 days due to inability to swallow. Only enough to drink to wet mouth. He is needing a peg tube. Peg tube is one of the recommendations by Dr. Karilyn Cota at the time of his EGD. Please give Dr. Thornton Papas office a call and ask for [facility] or Tam[facility]. The return phone number is [phone].

## 2012-09-20 NOTE — Telephone Encounter (Signed)
EGD w. Peg placement sch'd 09/21/12 at 345 (245), patient aware

## 2012-09-21 ENCOUNTER — Encounter (HOSPITAL_COMMUNITY)
Admission: RE | Disposition: A | Discharge status: Home / Self Care | Payer: Self-pay | Source: Ambulatory Visit | Attending: Internal Medicine

## 2012-09-21 ENCOUNTER — Encounter (HOSPITAL_BASED_OUTPATIENT_CLINIC_OR_DEPARTMENT_OTHER): Payer: Medicare Other

## 2012-09-21 ENCOUNTER — Ambulatory Visit (HOSPITAL_COMMUNITY)
Admission: RE | Admit: 2012-09-21 | Discharge: 2012-09-21 | Disposition: A | Discharge status: Home / Self Care | Payer: Medicare Other | Source: Ambulatory Visit | Attending: Internal Medicine | Admitting: Internal Medicine

## 2012-09-21 ENCOUNTER — Encounter (HOSPITAL_COMMUNITY): Payer: Self-pay

## 2012-09-21 VITALS — BP 118/82 | HR 97 | Temp 97.6°F | Resp 16

## 2012-09-21 DIAGNOSIS — C159 Malignant neoplasm of esophagus, unspecified: Secondary | ICD-10-CM | POA: Insufficient documentation

## 2012-09-21 DIAGNOSIS — R1310 Dysphagia, unspecified: Secondary | ICD-10-CM

## 2012-09-21 DIAGNOSIS — K222 Esophageal obstruction: Secondary | ICD-10-CM | POA: Insufficient documentation

## 2012-09-21 DIAGNOSIS — I10 Essential (primary) hypertension: Secondary | ICD-10-CM | POA: Insufficient documentation

## 2012-09-21 DIAGNOSIS — K269 Duodenal ulcer, unspecified as acute or chronic, without hemorrhage or perforation: Secondary | ICD-10-CM | POA: Insufficient documentation

## 2012-09-21 HISTORY — PX: PEG TUBE PLACEMENT: SUR1034

## 2012-09-21 HISTORY — PX: ESOPHAGOGASTRODUODENOSCOPY: SHX5428

## 2012-09-21 HISTORY — PX: PEG PLACEMENT: SHX5437

## 2012-09-21 SURGERY — EGD (ESOPHAGOGASTRODUODENOSCOPY)
Anesthesia: Moderate Sedation

## 2012-09-21 MED ORDER — SODIUM CHLORIDE 0.9 % IV SOLN
INTRAVENOUS | Status: DC | PRN
  Administered 2012-09-21: 15:00:00

## 2012-09-21 MED ORDER — OMEPRAZOLE 40 MG PO CPDR: 40.0000 mg | DELAYED_RELEASE_CAPSULE | Freq: Two times a day (BID) | ORAL | Status: DC

## 2012-09-21 MED ORDER — MEPERIDINE HCL 25 MG/ML IJ SOLN
INTRAMUSCULAR | Status: DC | PRN
  Administered 2012-09-21 (×2): 25 mg via INTRAVENOUS

## 2012-09-21 MED ORDER — OXYCODONE HCL 5 MG/5ML PO SOLN: 5.0000 mg | ORAL | Status: DC | PRN

## 2012-09-21 MED ORDER — SODIUM CHLORIDE 0.9 % IV SOLN
INTRAVENOUS | Status: DC
  Administered 2012-09-21: 10:00:00 via INTRAVENOUS

## 2012-09-21 MED ORDER — MIDAZOLAM HCL 5 MG/5ML IJ SOLN
INTRAMUSCULAR | Status: DC | PRN
  Administered 2012-09-21 (×2): 2 mg via INTRAVENOUS
  Administered 2012-09-21: 1 mg via INTRAVENOUS
  Administered 2012-09-21: 3 mg via INTRAVENOUS
  Administered 2012-09-21: 2 mg via INTRAVENOUS

## 2012-09-21 MED ORDER — BUTAMBEN-TETRACAINE-BENZOCAINE 2-2-14 % EX AERO
INHALATION_SPRAY | CUTANEOUS | Status: DC | PRN
  Administered 2012-09-21: 2 via TOPICAL

## 2012-09-21 MED ORDER — CEFAZOLIN SODIUM 1-5 GM-% IV SOLN: 1.0000 g | Freq: Once | INTRAVENOUS | Status: DC

## 2012-09-21 MED ORDER — LIDOCAINE HCL (PF) 1 % IJ SOLN
INTRAMUSCULAR | Status: AC
  Filled 2012-09-21: qty 5

## 2012-09-21 MED ORDER — STERILE WATER FOR IRRIGATION IR SOLN
Status: DC | PRN
  Administered 2012-09-21: 14:00:00

## 2012-09-21 MED ORDER — SODIUM CHLORIDE 0.9 % IJ SOLN: 75.0000 mL | INTRAMUSCULAR | Status: DC | PRN

## 2012-09-21 MED ORDER — CEFAZOLIN SODIUM 1-5 GM-% IV SOLN
INTRAVENOUS | Status: AC
  Filled 2012-09-21: qty 50

## 2012-09-21 MED ORDER — MEPERIDINE HCL 50 MG/ML IJ SOLN
INTRAMUSCULAR | Status: AC
  Filled 2012-09-21: qty 1

## 2012-09-21 MED ORDER — SODIUM CHLORIDE 0.9 % IJ SOLN
INTRAMUSCULAR | Status: AC
  Filled 2012-09-21: qty 10

## 2012-09-21 MED ORDER — MIDAZOLAM HCL 5 MG/5ML IJ SOLN
INTRAMUSCULAR | Status: AC
  Filled 2012-09-21: qty 5

## 2012-09-21 MED ORDER — MIDAZOLAM HCL 5 MG/5ML IJ SOLN
INTRAMUSCULAR | Status: AC
  Filled 2012-09-21: qty 10

## 2012-09-21 NOTE — H&P (Signed)
Eddie [name] is an 66 y.o. male.   Chief Complaint: Patient is here for PEG. HPI: Patient is 66 year old male who was evaluated last week for dysphagia and weight loss. He was found to have esophageal mass. He also had high-grade stricture. 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 He has prominent lymph nodes in the area of lesion but no distant metastases. He is having difficulty swallowing and even liquids. He was seen at oncology clinic and given IV fluids yesterday and today. Patient is agreeable to proceeding with PEG so that he could be supported during treatment of esophageal carcinoma.  Past Medical History  Diagnosis Date  . Hypertension   . Chronic back pain   . GERD (gastroesophageal reflux disease)   . Squamous cell carcinoma Oct 2014    Esophogus    History reviewed. No pertinent past surgical history.  Family History  Problem Relation Age of Onset  . Stroke Mother   . Hypertension Father   . Cancer Brother     Throat cancer  . Kidney disease Daughter    Social History:  reports that he has been smoking.  He does not have any smokeless tobacco history on file. He reports that he drinks about 3.6 ounces of alcohol per week. He reports that he does not use illicit drugs.  Allergies:  Allergies  Allergen Reactions  . Motrin (Ibuprofen) Swelling    Medications Prior to Admission  Medication Sig Dispense Refill  . lisinopril-hydrochlorothiazide (ZESTORETIC) 10-12.5 MG per tablet Take 1 tablet by mouth daily.  30 tablet  3  . omeprazole (PRILOSEC) 40 MG capsule Take 1 capsule (40 mg total) by mouth daily.  30 capsule  3    Results for orders placed in visit on 09/20/12 (from the past 48 hour(s))  CBC WITH DIFFERENTIAL     Status: Abnormal   Collection Time   09/20/12 12:38 PM      Component Value Range Comment   WBC 19.0 (*) 4.0 - 10.5 K/uL    RBC 4.77  4.22 - 5.81 MIL/uL    Hemoglobin 15.5  13.0 - 17.0 g/dL    HCT 16.1  09.6 - 04.5 %    MCV 97.5   78.0 - 100.0 fL    MCH 32.5  26.0 - 34.0 pg    MCHC 33.3  30.0 - 36.0 g/dL    RDW 40.9  81.1 - 91.4 %    Platelets 277  150 - 400 K/uL    Neutrophils Relative 80 (*) 43 - 77 %    Neutro Abs 15.1 (*) 1.7 - 7.7 K/uL    Lymphocytes Relative 11 (*) 12 - 46 %    Lymphs Abs 2.1  0.7 - 4.0 K/uL    Monocytes Relative 9  3 - 12 %    Monocytes Absolute 1.7 (*) 0.1 - 1.0 K/uL    Eosinophils Relative 0  0 - 5 %    Eosinophils Absolute 0.0  0.0 - 0.7 K/uL    Basophils Relative 0  0 - 1 %    Basophils Absolute 0.1  0.0 - 0.1 K/uL   COMPREHENSIVE METABOLIC PANEL     Status: Abnormal   Collection Time   09/20/12 12:38 PM      Component Value Range Comment   Sodium 136  135 - 145 mEq/L    Potassium 4.7  3.5 - 5.1 mEq/L    Chloride 95 (*) 96 - 112 mEq/L    CO2 19  19 - 32 mEq/L    Glucose, Bld 70  70 - 99 mg/dL    BUN 46 (*) 6 - 23 mg/dL    Creatinine, Ser 1.30 (*) 0.50 - 1.35 mg/dL    Calcium 86.5  8.4 - 10.5 mg/dL    Total Protein 7.8  6.0 - 8.3 g/dL    Albumin 3.9  3.5 - 5.2 g/dL    AST 10  0 - 37 U/L    ALT 5  0 - 53 U/L    Alkaline Phosphatase 85  39 - 117 U/L    Total Bilirubin 1.4 (*) 0.3 - 1.2 mg/dL    GFR calc non Af Amer 24 (*) >90 mL/min    GFR calc Af Amer 27 (*) >90 mL/min    No results found.  ROS  Blood pressure 129/77, pulse 75, temperature 97.8 F (36.6 C), temperature source Oral, resp. rate 12, SpO2 93.00%. Physical Exam  Constitutional: He appears well-developed.       Thin African American male in NAD  HENT:  Mouth/Throat: Oropharynx is clear and moist.       Patient is edentulous  Eyes: Conjunctivae normal are normal. No scleral icterus.  Neck: No thyromegaly present.  Cardiovascular: Normal rate, regular rhythm and normal heart sounds.   No murmur heard. Respiratory: Effort normal and breath sounds normal.  GI: Soft. He exhibits no distension and no mass. There is no tenderness.  Musculoskeletal: He exhibits no edema.  Lymphadenopathy:    He has no  cervical adenopathy.  Neurological: He is alert.  Skin: Skin is warm and dry.     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 EGD with ED and PEG tube placement.  Ferlin Fairhurst U 09/21/2012, 2:31 PM

## 2012-09-21 NOTE — Progress Notes (Signed)
INITIAL ADULT NUTRITION ASSESSMENT Date: 09/21/2012   Time: 4:06 PM Reason for Assessment: TF Consult  ASSESSMENT: Male 66 y.o.  Dx: Esophageal carcinoma    Past Medical History  Diagnosis Date  . Hypertension   . Chronic back pain   . GERD (gastroesophageal reflux disease)   . Squamous cell carcinoma Oct 2014    Esophogus     Scheduled Meds:   .  ceFAZolin (ANCEF) IV  1 g Intravenous Once  . meperidine      . midazolam       Continuous Infusions:   . sodium chloride 20 mL/hr at 09/21/12 1236   PRN Meds:.DISCONTD: ANCEF 1 gram in 0.9% normal saline 500 mL, DISCONTD: butamben-tetracaine-benzocaine, DISCONTD: meperidine, DISCONTD: midazolam, DISCONTD: simethicone susp in sterile water 1000 mL irrigation  Ht:  66"     Wt:   112#  (51kg)  Ideal Wt:     142# % Ideal Wt: 74%  There is no height or weight on file to calculate BMI.  Food/Nutrition Related Hx: Pt has dx of esophogeal carcinoma. PEG placed for nutrition support. Pt unable to consume adequate nutrition orally. Severe wt loss of 30# per spouse.He is at risk for refeeding given his prolonged inadequate oral intake.   CMP     Component Value Date/Time   NA 133* 09/21/2012 1001   K 4.5 09/21/2012 1001   CL 95* 09/21/2012 1001   CO2 17* 09/21/2012 1001   GLUCOSE 70 09/21/2012 1001   BUN 51* 09/21/2012 1001   CREATININE 1.96* 09/21/2012 1001   CREATININE 1.12 07/30/2012 0950   CALCIUM 10.3 09/21/2012 1001   PROT 7.8 09/20/2012 1238   ALBUMIN 3.9 09/20/2012 1238   AST 10 09/20/2012 1238   ALT 5 09/20/2012 1238   ALKPHOS 85 09/20/2012 1238   BILITOT 1.4* 09/20/2012 1238   GFRNONAA 34* 09/21/2012 1001   GFRAA 40* 09/21/2012 1001     Diet Order: NPO   Supplements/Tube Feeding:New PEG  IVF:    sodium chloride Last Rate: 20 mL/hr at 09/21/12 1236    Estimated Nutritional Needs:   Kcal:1530-1785 Protein:66-77 gr Fluid:1.5-1.8 ml/day  NUTRITION DIAGNOSIS: -Inadequate oral intake (NI-2.1).   Status: Ongoing  RELATED TO: esophogeal carcinioma  AS EVIDENCE BY: pt hx  MONITORING/EVALUATION(Goals): Monitor tolerance and advancement per Home Health care  EDUCATION NEEDS: -Education needs addressed  INTERVENTION:  Bolus of Normal Saline via PEG 150 ml q 2 hr per MD Start bolus Tube feedings QID via PEG.  1200 on 10/19 start Jevity 1.2 @ 237 ml  Bolus. Increase to 360 ml at 1600 and 2000 daily.  Flush tube with water 120 ml before and after each feeding to meet estimated fluid needs.  Dietitian [phone]  DOCUMENTATION CODES Per approved criteria  -Severe malnutrition in the context of chronic illness    Francene Boyers 09/21/2012, 4:06 PM

## 2012-09-21 NOTE — Progress Notes (Signed)
Dr. Karilyn Cota notified of dietician's recommendations.  Dr. Karilyn Cota verified recommendations, and instructions were given to pt family.  Pt's family verbalized understanding of PEG tube instructions.

## 2012-09-21 NOTE — Op Note (Signed)
EGD PROCEDURE REPORT  PATIENT:  Eddie [name]  MR#:  [id number] Birthdate:  January 30, 1946, 66 y.o., male Endoscopist:  Dr. Malissa Hippo, MD Referred By:  Dr. Bonnetta Barry ref. provider found Procedure Date: 09/21/2012  Procedure:   EGD with ED and PEG placement.  Indications:  Patient is 66 year old African American male who was diagnosed with squamous cell carcinoma of last week. He was found to have high-grade stricture to its distal end of the tumor. Patient has an appointment to see Dr. Mariel Sleet next week. However he had to be given IV fluids yesterday and today via oncology clinic for dehydration. He is here today to have PEG placement. He has high grade esophageal stricture and will need dilation in order to pass the scope into the stomach.           Informed Consent:  The risks, benefits, alternatives & imponderables which include, but are not limited to, bleeding, infection, perforation, drug reaction and potential missed lesion have been reviewed.  The potential for biopsy, lesion removal, esophageal dilation, etc. have also been discussed.  Questions have been answered.  All parties agreeable.  Please see history & physical in medical record for more information.  Medications:  Demerol 50 mg IV Versed 12  mg IV Cetacaine spray topically for oropharyngeal anesthesia  Description of procedure:   Procedure was performed in endoscopy suite. Patient's vital signs and O2 sat were monitored during the procedure and remained stable. Procedure was assisted by Ms. Jannett Celestine RN and Ms.Hollie Salk, RN. Patient was placed in left lateral recumbent position and Pentax videoscope was passed via oropharynx without any difficulty into the proximal esophagus. Proximal segment was dilated with clear secretions. These were cleared. Polypoidal ulcerated mass was noted with luminal compromise. This lesion measured about 5 cm in length. Distally there was high-grade stricture which was felt to be due to  submucosal process. This area was previously examined using Slim the scope last week. Balloon dilator was passed through the stricture under direct vision. The stricture was dilated to 10, 11 and finally to 12 mm. I wasn't able to pass the scope past the stricture. Because of the distal esophagus was normal. GE junction was unremarkable. Stomach was empty and distended very well with insufflation. Mucosa at body, antrum as well as pyloric channel, angularis fundus and cardia was normal. Examination of bulbar mucosa revealed scattered erosions and a small ulcer along the anterior wall measuring 6-7 mm. This was new finding since his last EGD H. days ago. Please note his H. pylori serology was positive and he has not had been treated. Once examination of upper GI tract was completed patient was turned into supine position. Stomach was insufflated and site was marked for placement of PEG with there was advanced gastric illumination and indentation on pushing with fingertip. Skin was then prepped in the usual fashion with be saline solution. 1% Xylocaine was injected the skin and subcutaneous tissue. Small skin deep incision was made. 14-gauge Seldinger needle was pushed across the abdominal wall into gastric lumen on first attempt. Trocar was removed leaving the cannula in place. 035 guidewire was passed through the cannula. It was caught with snare and endoscope was withdrawn along with his wire. 20 [place] and overlying gastrostomy tube was advanced over the guidewire. As the tape appeared at the G. Site, it was pulled until shown was felt to be resting and he is gastric mucosa. Endoscope was passed again and site examined. Tissue was in good position.  The reading on the outside was 1 cm. On the outside tube was secured by placing a bolster around it. It was cut to desired length and connected to an adapter. G. site was covered a triple antibiotic and sterile dressing was applied. Patient tolerated the procedure  well.    Therapeutic/Diagnostic Maneuvers Performed:  See above  Complications:  None   Impression: Esophageal mass associated with high-grade stricture previously documented to be squamous cell carcinoma. Stricture dilated to 12 mm to facilitate passage of the scope in order to place PEG. Duodenal ulcer which is new since EGD of a days ago. Known H. pylori infection diagnosed last week to be treated. 20 French gastrostomy tube placed and the gastric body for feeding purposes.  Recommendations:  Oxycodone liquid 5 mg every 4 when necessary pain via PEG. Normal saline via PEG until tomorrow afternoon after which he will be started on bolus feeding. Dietary consultation. Consultation with advanced home care. Increase omeprazole to 40 mg twice a day via PEG.   Lanya Bucks U  09/21/2012  3:15 PM  CC: Dr. Milinda Antis, MD & Dr. Bonnetta Barry ref. provider found

## 2012-09-23 LAB — BASIC METABOLIC PANEL
Calcium: 10.3 mg/dL (ref 8.4–10.5)
Chloride: 95 mEq/L — ABNORMAL LOW (ref 96–112)
Creatinine, Ser: 1.96 mg/dL — ABNORMAL HIGH (ref 0.50–1.35)
GFR calc Af Amer: 40 mL/min — ABNORMAL LOW (ref >90)
GFR calc non Af Amer: 34 mL/min — ABNORMAL LOW (ref >90)

## 2012-09-25 ENCOUNTER — Encounter (HOSPITAL_COMMUNITY): Payer: Self-pay | Admitting: Oncology

## 2012-09-25 ENCOUNTER — Encounter (HOSPITAL_BASED_OUTPATIENT_CLINIC_OR_DEPARTMENT_OTHER): Payer: Medicare Other | Admitting: Oncology

## 2012-09-25 VITALS — BP 115/82 | HR 94 | Temp 98.1°F | Resp 16 | Ht 67.0 in | Wt 111.9 lb

## 2012-09-25 DIAGNOSIS — F1021 Alcohol dependence, in remission: Secondary | ICD-10-CM

## 2012-09-25 DIAGNOSIS — R1310 Dysphagia, unspecified: Secondary | ICD-10-CM

## 2012-09-25 DIAGNOSIS — C159 Malignant neoplasm of esophagus, unspecified: Secondary | ICD-10-CM

## 2012-09-25 DIAGNOSIS — F17200 Nicotine dependence, unspecified, uncomplicated: Secondary | ICD-10-CM

## 2012-09-25 HISTORY — DX: Malignant neoplasm of esophagus, unspecified: C15.9

## 2012-09-25 NOTE — Progress Notes (Signed)
[facility] NEW PATIENT EVALUATION   Name: Eddie [name] Date: 09/25/2012 MRN: [id number] DOB: [birth year]    CC: Milinda Antis, MD     DIAGNOSIS: Esophageal squamous cell carcinoma   HISTORY OF PRESENT ILLNESS:Eddie [name] is a 66 y.o. male who is referred to the [facility] by Dr. Karilyn Cota (GI) after an EGD.  The impression from this procedure is a 5 cm long esophageal mass with poorly portal appearance proximally endomural involvement with stricture a long passage of 7 mm scope. This lesion was biopsied for histology which revealed SQUAMOUS CELL CARCINOMA.   He is S/P PEG tube insertion by Dr. Karilyn Cota on 10/18.  He is tolerating this well.  He does admit to some loose stools and if this continues, I would recommend he contact GI by the middle of this week.  He presented to the [facility] on Thursday and Friday for fluids.  He received 1 L NS both days and his renal function was significantly reflective of dehydration.   Patient reports a two-month history of dysphagia which is how he got to consult and with Dr. Karilyn Cota. Dr. Karilyn Cota, as mentioned above performed an EGD which revealed the squamous cell carcinoma. The patient reports that he was a heavy drinker. He quit approximately 2 weeks ago. He enjoys consuming gin alcohol. He is also a smoker and has smoked one pack per day for approximately 50 years. He is now down to 6-7 cigarettes daily.  He is accompanied by his sister-in-law named Britta Mccreedy. As mentioned above, he is status post a PEG placement.   He feels much better now that he is consuming fluids and liquid supplementation via PEG tube. The patient reports that he is consuming 120 cc of water every 4 hours in addition to 4 cans of liquid nutrition via his PEG tube.  He is already seen the nutritionist. Melina Fiddler encourage the patient to work up to 6 cans of nutrition daily and we've also encouraged him to double his fluid intake from 120 cc to 240 cc every 4  hours.  He lives with his wife at home. She unfortunately had 2 strokes and he takes care of her.  We spent some time discussing a plan with the patient. This is further illustrated below at the end of this note and the plan section.  The patient was seen by Dr. Thersa Salt (radiation oncology) yesterday. His note is not available to me at this point time.   FAMILY HISTORY: family history includes Cancer in his brother; Hypertension in his father; Kidney disease in his daughter; and Stroke in his mother.   the patient has 4 children. He has one son who is 65 years old and he is healthy. He has a daughter in her 30s and she is healthy. He also has another son who is in his 30s and he is on hemodialysis. His youngest daughter is in her early 11s and she is healthy. The patient's father is alive at the age of 40 he lives at home and suffers with hypertension. His mother passed away in her early 60s from a stroke. The patient has 6 brothers and 4 sisters. One brother is deceased from a stroke.  PAST MEDICAL HISTORY:  has a past medical history of Hypertension; Chronic back pain; GERD (gastroesophageal reflux disease); and Squamous cell carcinoma (Oct 2014).      CURRENT MEDICATIONS: See. CHL   SOCIAL HISTORY:  reports that he has been smoking.  He does not have any smokeless tobacco history on file. He reports that he drinks about 3.6 ounces of alcohol per week. He reports that he does not use illicit drugs.   the patient used to work in Set [employer] he is on disability due to 2 lower back issues. He's been married to his second wife for 10 years. He does admit to begin alcohol usage and he takes approximately 4 ounces of heart alcohol 3 times a day in the past. This is decreased due to his dysphagia. He also reports a one pack per day smoking history x50 years. He is now down to 6-7 cigarettes daily.   ALLERGIES: Motrin - swelling   LABORATORY DATA:  CBC    Component Value Date/Time   WBC  19.0* 09/20/2012 1238   RBC 4.77 09/20/2012 1238   HGB 15.5 09/20/2012 1238   HCT 46.5 09/20/2012 1238   PLT 277 09/20/2012 1238   MCV 97.5 09/20/2012 1238   MCH 32.5 09/20/2012 1238   MCHC 33.3 09/20/2012 1238   RDW 11.9 09/20/2012 1238   LYMPHSABS 2.1 09/20/2012 1238   MONOABS 1.7* 09/20/2012 1238   EOSABS 0.0 09/20/2012 1238   BASOSABS 0.1 09/20/2012 1238      Chemistry      Component Value Date/Time   NA 136 09/21/2012 1001   K 4.5 09/21/2012 1001   CL 95* 09/21/2012 1001   CO2 17* 09/21/2012 1001   BUN 51* 09/21/2012 1001   CREATININE 1.96* 09/21/2012 1001   CREATININE 1.12 07/30/2012 0950      Component Value Date/Time   CALCIUM 10.3 09/21/2012 1001   ALKPHOS 85 09/20/2012 1238   AST 10 09/20/2012 1238   ALT 5 09/20/2012 1238   BILITOT 1.4* 09/20/2012 1238       RADIOGRAPHY:  09/13/2012  *RADIOLOGY REPORT*  Clinical Data: Esophageal mass on endoscopy today. Weight loss and  hemoptysis.  CT CHEST WITH CONTRAST  Technique: Multidetector CT imaging of the chest was performed  following the standard protocol during bolus administration of  intravenous contrast.  Contrast: 40mL OMNIPAQUE IOHEXOL 300 MG/ML SOLN ; contrast volume  was reduced due to abdominal CT having been performed earlier same  date.  Comparison: Chest radiographs 07/16/2012. Abdominal CT today.  Findings: There is a large intraluminal filling defect within the  proximal thoracic esophagus. This measures approximately 2.7 x 3.3  cm transverse and 5.4 cm cephalocaudad. The esophagus is distended  proximal to this mass. There is no gross extension of tumor beyond  the walls of the esophagus. The distal esophagus is decompressed.  No enlarged lymph nodes are seen immediately adjacent to the  esophageal lesion. However, there are soft tissue nodules within  both tracheoesophageal grooves superior to the mass, just inferior  to the thyroid gland. These measure 9 mm short axis on the right  and  11 mm on the left (image 12).  There are faint coronary artery calcifications. There is no  pleural or pericardial effusion. Mild emphysematous changes are  present throughout the lungs. There is minimal patchy  peribronchial opacity in the lingula on image number 34. This has  a tree-in-bud appearance and is likely inflammatory.  The visualized upper abdomen appears unremarkable. There is  contrast material within the renal collecting systems related to  the preceding abdominal CT.  IMPRESSION:  1. Large intraluminal mass involving the proximal thoracic  esophagus suspicious for esophageal malignancy. Correlate  clinically.  2. Nonspecific soft tissue nodules in both tracheoesophageal  grooves,  potentially lymph nodes or parathyroid lesions.  3. Emphysema with mild tree in bud peribronchial opacity in the  lingula.  Original Report Authenticated By: Gerrianne Scale, M.D.   *RADIOLOGY REPORT*  Clinical Data: Esophageal mass, history hypertension, GERD  CT ABDOMEN AND PELVIS WITH CONTRAST  Technique: Multidetector CT imaging of the abdomen and pelvis was  performed following the standard protocol during bolus  administration of intravenous contrast. Sagittal and coronal MPR  images reconstructed from axial data set.  Contrast: OMNIPAQUE IOHEXOL 300 MG/ML SOLN Dilute oral  contrast.  Comparison: None  Findings:  Minimal dependent atelectasis right lower lobe.  Liver, spleen, pancreas, kidneys, and adrenal glands normal.  Gaseous distention of the stomach.  Question minimal wall thickening versus underdistension at the  pylorus.  Scattered atherosclerotic calcifications aorta without aneurysm.  Bowel loops unremarkable.  No mass, adenopathy, free fluid, or free air.  Normal appendix.  Scattered endplate spur formation thoracic and lumbar spine.  IMPRESSION:  Questionable bowel wall thickening of pylorus versus artifact from  underdistension.  No acute intra-abdominal  or intrapelvic abnormalities.  Gaseous distention of stomach likely related to preceding upper  endoscopy.  Original Report Authenticated By: Lollie Marrow, M.D.    PATHOLOGY:  09/13/2012  Diagnosis Esophagus, biopsy, mass SQUAMOUS CELL CARCINOMA. PLEASE SEE COMMENT. Microscopic Comment The biopsies are composed of superficial fragments of invasive moderately differentiated squamous cell carcinoma with associated brisk mitotic activity, cytologic atypia and focal keratin pearl formation. There is no evidence of angiolymphatic invasion or perineural invasion identified in this material. The case was discussed with Dr. Karilyn Cota on 09/17/12. (HCL:caf 09/17/12) Abigail Miyamoto MD Pathologist, Electronic Signature (Case signed 09/17/2012)    PHYSICAL EXAM:  vitals were not taken for this visit. General appearance: alert, cooperative, appears stated age, cachectic and no distress Head: Normocephalic, without obvious abnormality, atraumatic Neck: no adenopathy and supple, symmetrical, trachea midline Lymph nodes: Cervical, supraclavicular, and axillary nodes normal. Resp: clear to auscultation bilaterally and normal percussion bilaterally Cardio: regular rate and rhythm, S1, S2 normal, no murmur, click, rub or gallop GI: soft, non-tender; bowel sounds normal; no masses,  no organomegaly Neurologic: Grossly normal     IMPRESSION:  1. Squamous cell carcinoma of esophagus. 2. Dysphagia 3. Cachexia 4. H/O EtOHism 5. Tobacco abuse 50 pack years   PLAN:  1. We will set the patient up for a PET scan to complete staging workup. 2. These aren't seen by radiation oncology and we are awaiting Dr. Bernerd Pho notes.  3. We will refer the patient to oncologic dentist in [place], namely Dr. Kristin Bruins 4. We'll refer the patient to CVTS for consideration of surgical intervention. I suspect that this is unlikely but may be worthwhile to get surgical input with regard to this patient's care.    5. Patient return for followup in 2 weeks. By that time, we should have a PET scan completed with the above-mentioned consultations.  All questions were answered. The patient is call the clinic with any problems, questions, or concerns.  Patient and plan discussed with Dr. Glenford Peers and he is in agreement with the aforementioned. Patient seen and examined by Dr. Glenford Peers as well.  KEFALAS,THOMAS

## 2012-09-25 NOTE — Patient Instructions (Addendum)
[facility]  Discharge Instructions  RECOMMENDATIONS MADE BY THE CONSULTANT AND ANY TEST RESULTS WILL BE SENT TO YOUR REFERRING DOCTOR.   We need to get you scheduled for a PET scan. This will done at [facility]. 12 midnight the night before your PET scan you should not have any tube feeding through the PEG tube only water/saline flushes.  We will see you back in this office after your PET scan, 1-2 weeks.  We will then get you scheduled to see a surgeon if needed or a dentist if necessary.   I acknowledge that I have been informed and understand all the instructions given to me and received a copy. I do not have any more questions at this time, but understand that I may call the Specialty Clinic at [facility] at [phone] during business hours should I have any further questions or need assistance in obtaining follow-up care.    __________________________________________  _____________  __________ Signature of Patient or Authorized Representative            Date                   Time    __________________________________________ Nurse's Signature

## 2012-09-27 ENCOUNTER — Telehealth: Payer: Self-pay

## 2012-09-27 NOTE — Telephone Encounter (Signed)
Yes, hold lisinopril, he can continue to check and call if BP is > 140/90

## 2012-09-27 NOTE — Telephone Encounter (Signed)
Called regarding BP Pt takes lisinopril 1 daily through PEG tube  States BP yesterday was 100/68 Came back for a recheck today and it was 98/60 right and 90/60 left.  [phone]- does he need to hold his BP med?

## 2012-09-27 NOTE — Telephone Encounter (Signed)
PATIENT AWARE AND MSG LEFT FOR NURSE

## 2012-09-28 ENCOUNTER — Telehealth (HOSPITAL_COMMUNITY): Payer: Self-pay | Admitting: *Deleted

## 2012-09-28 NOTE — Telephone Encounter (Signed)
Eddie [name] has appt with Dr. Tyrone Sage on Monday Oct 28th at 3:15 PM.  Gave the information to his daughter to let him know....Marland KitchenMarland Kitchen

## 2012-10-01 ENCOUNTER — Institutional Professional Consult (permissible substitution) (INDEPENDENT_AMBULATORY_CARE_PROVIDER_SITE_OTHER): Payer: Medicare Other | Admitting: Cardiothoracic Surgery

## 2012-10-01 ENCOUNTER — Encounter: Payer: Self-pay | Admitting: Cardiothoracic Surgery

## 2012-10-01 VITALS — BP 120/78 | HR 85 | Resp 18 | Ht 67.0 in | Wt 111.0 lb

## 2012-10-01 DIAGNOSIS — C159 Malignant neoplasm of esophagus, unspecified: Secondary | ICD-10-CM

## 2012-10-01 NOTE — Patient Instructions (Signed)
Esophageal Cancer Esophageal cancer occurs when abnormal cells within the esophagus begin to divide rapidly and uncontrollably. The esophagus is the tube that carries food and drink from the throat into the stomach.  CAUSES  The exact cause of esophageal cancer is not known. It is believed that esophageal cancer occurs due to many factors. People are at a higher risk of developing esophageal cancer if they:  Are older than 65 years of age.  Are male.  Smoke or use tobacco.  Drink heavily.  Eat a poor diet (low in fruits and vegetables).  Are overweight (obese).  Have conditions which result in long-standing damage or irritation to the esophagus. These conditions include:  Acid reflux (the stomach acid leaks back up the esophagus, damaging it).  Barrett Esophagus (abnormal cells are found in the lower part of the esophagus, usually due to acid reflux occurring over a long period of time).  Achalasia (the muscles and nerves of the esophagus do not work properly. Food and drink do not move in a normal way down the esophagus and into the stomach).  Esophageal webs (thin strings of tissues grow within the esophagus).  Damage due to toxic exposures (an example would be swallowing a caustic poison such as lye). SYMPTOMS  Symptoms can include:  Trouble swallowing.  Chest or back pain.  Unintentional weight loss.  Severe tiredness (fatigue).  Hoarse voice.  Cough. DIAGNOSIS  Esophageal cancer is usually diagnosed by performing:  Barium swallow. After drinking barium (a liquid that coats the esophagus), a series of X-rays are taken which can reveal abnormalities.  Endoscopic exam. A lighted scope is used to view the inside of the esophagus.  Biopsy. Small pieces of the esophagus are removed for exam in a lab. This can be done through the scope used for an endoscopic exam. TREATMENT  Treatment of esophageal cancer depends on a number of factors, including:   Characteristics  of the cancer cells present.  Tumor size.  Whether the cancer has spread to lymph nodes or to other more distant locations (such as other organs or bone). The types of treatments used for esophageal cancer include:  Surgery to remove as much of the cancer as possible.  Chemotherapy (medicines that kill cancer cells).  Radiation therapy to kill cancer cells.  Combinations of chemotherapy and radiation therapy.  Biological therapy that uses antibodies in ways that can take advantage of the weaknesses of the tumor cells. Your caregivers will also address your needs for pain relief, nutrition, and help with swallowing problems if these develop.  HOME CARE INSTRUCTIONS   Only take over-the-counter or prescription medicines for pain, discomfort or fever as directed by your caregiver.  Maintain a healthy diet. Advice from a nutritionist can be helpful when addressing your specific needs.  Consider joining a support group. This may help you learn to cope with the stress of having esophageal cancer.  Seek advice to help you manage treatment side effects. SEEK MEDICAL CARE IF:  You develop problems with swallowing that are getting worse.  You notice new fatigue or weakness.  You experience unintentional weight loss. SEEK IMMEDIATE MEDICAL CARE IF:  You have a sudden increase in pain.  You have trouble breathing.  You have a fever.  You vomit blood or black material that looks like coffee grounds.  You faint. Document Released: 11/03/2008 Document Revised: 02/13/2012 Document Reviewed: 11/03/2008 Exit[employer] Patient Information 2013 [employer].  

## 2012-10-01 NOTE — Progress Notes (Signed)
[address]            [name] [address]          [phone]      Eddie [name] [facility] Medical Record #[id number] Date of Birth: 05/12/1946  Referring: Randall An, MD Primary Care: Milinda Antis, MD  Chief Complaint:    Chief Complaint  Patient presents with  . Esophageal Cancer    Referral from Dr Mariel Sleet for surgical eval on esophageal cancer, PET Scan scheduled for 10/03/12    History of Present Illness:    Patient is a 66 year old male with a long history of tobacco and alcohol use who presents with a 25 pound weight loss over the past. In addition is been unable to the for the past several weeks. Recent evaluation by Dr. Dionicia Abler showed a esophageal mass between 20 cm and 25 cm, squamous cell carcinoma. A PEG tube has been placed and the patient has been started on tube feedings. A PET scan is planned for later this week.       Current Activity/ Functional Status: Patient is mobile, but depends greatly on his sister-in-law for guidance and care   Past Medical History  Diagnosis Date  . Hypertension   . Chronic back pain   . GERD (gastroesophageal reflux disease)   . Squamous cell carcinoma Oct 2014    Esophogus  . Squamous cell carcinoma of esophagus 09/25/2012    Past Surgical History  Procedure Date  . Peg tube placement 09/21/2012  . Esophagogastroduodenoscopy 09/21/2012    Procedure: ESOPHAGOGASTRODUODENOSCOPY (EGD);  Surgeon: Malissa Hippo, MD;  [facility];  Service: Endoscopy;  Laterality: N/A;  345  . Peg placement 09/21/2012    Procedure: PERCUTANEOUS ENDOSCOPIC GASTROSTOMY (PEG) PLACEMENT;  Surgeon: Malissa Hippo, MD;  Location: [facility];  Service: Endoscopy;  Laterality: N/A;    Family History  Problem Relation Age of Onset  . Stroke Mother   . Hypertension Father   . Cancer Brother     Throat cancer  . Kidney disease Daughter     History   Social History  . Marital Status:  Married    Spouse Name: N/A    Number of Children: N/A  . Years of Education: N/A   Occupational History  . Not on file.   Social History Main Topics  . Smoking status: Current Every Day Smoker -- 1.0 packs/day for 50 years  . Smokeless tobacco: Never Used   Comment: 1/2 to 1 pack x50 yrs.  . Alcohol Use: 3.6 oz/week    6 Shots of liquor per week     twice a week  . Drug Use: No  . Sexually Active: Not on file   Other Topics Concern  . Not on file   Social History Narrative  . No narrative on file    History  Smoking status  . Current Every Day Smoker -- 1.0 packs/day for 50 years  Smokeless tobacco  . Never Used  Comment: 1/2 to 1 pack x50 yrs.    History  Alcohol Use  . 3.6 oz/week  . 6 Shots of liquor per week    twice a week     Allergies  Allergen Reactions  . Motrin (Ibuprofen) Swelling    Current Outpatient Prescriptions  Medication Sig Dispense Refill  . Nutritional Supplements (JEVITY PO) by PEG Tube route  4 (four) times daily. 1 8oz can 4 x daily      . oxyCODONE (ROXICODONE) 5 MG/5ML solution Take 5 mLs (5 mg total) by mouth every 4 (four) hours as needed for pain.  250 mL  0  . sodium chloride 0.9 % injection Place 75 mLs into feeding tube as needed.  1500 mL  0       Review of Systems:     Cardiac Review of Systems: Y or N  Chest Pain [    ]  Resting SOB [   ] Exertional SOB  [  ]  Orthopnea [  ]   Pedal Edema [   ]    Palpitations [  ] Syncope  [  ]   Presyncope [   ]  General Review of Systems: [Y] = yes [  ]=no Constitional: recent weight change [ yes ]; anorexia [  ]; fatigue Cove.Etienne  ]; nausea Cove.Etienne  ]; night sweats [n  ]; fever [ n ]; or chills [n  ];                                                                                                                                          Dental: poor dentition[ no teeth ];   Eye : blurred vision [  ]; diplopia [   ]; vision changes [  ];  Amaurosis fugax[  ]; Resp: cough [n  ];  wheezing[n  ];   hemoptysis[ n ]; shortness of breath[y]; paroxysmal nocturnal dyspnea[  ]; dyspnea on exertion[  ]; or orthopnea[  ];  GI:  gallstones[  ], vomiting[  ];  dysphagia[  ]; melena[  ];  hematochezia [  ]; heartburn[y  ];   Hx of  Colonoscopy[ n ]; GU: kidney stones [  ]; hematuria[  ];   dysuria [  ];  nocturia[  ];  history of     obstruction [  ];             Skin: rash, swelling[  ];, hair loss[  ];  peripheral edema[  ];  or itching[  ]; Musculosketetal: myalgias[  ];  joint swelling[  ];  joint erythema[  ];  joint pain[  ];  back pain[  ];  Heme/Lymph: bruising[  ];  bleeding[  ];  anemia[  ];  Neuro: TIA[  ];  headaches[  ];  stroke[  ];  vertigo[  ];  seizures[  ];   paresthesias[  ];  difficulty walking[  ];  Psych:depression[  ]; anxiety[  ];  Endocrine: diabetes[  ];  thyroid dysfunction[ n ];  Immunizations: Flu [ n ]; Pneumococcal[ n ];  Other:  Physical Exam: BP 120/78  Pulse 85  Resp 18  Ht 5\' 7"  (1.702 m)  Wt 111 lb (50.349 kg)  BMI 17.38 kg/m2  SpO2 99%  General appearance: alert,  appears older than stated age, cachectic, distracted, fatigued, no distress and slowed mentation Neurologic: intact Heart: regular rate and rhythm, S1, S2 normal, no murmur, click, rub or gallop and normal apical impulse Lungs: clear to auscultation bilaterally and normal percussion bilaterally Abdomen: soft, non-tender; bowel sounds normal; no masses,  no organomegaly Extremities: extremities normal, atraumatic, no cyanosis or edema and Homans sign is negative, no sign of DVT Patient has no cervical or supraclavicular adenopathy he has no carotid bruits. A PEG tube is present in the mid upper abdominal   Diagnostic Studies & Laboratory data:     Recent Radiology Findings:  Ct Chest W Contrast  09/13/2012  *RADIOLOGY REPORT*  Clinical Data: Esophageal mass on endoscopy today.  Weight loss and hemoptysis.  CT CHEST WITH CONTRAST  Technique:  Multidetector CT imaging of the chest was  performed following the standard protocol during bolus administration of intravenous contrast.  Contrast: 40mL OMNIPAQUE IOHEXOL 300 MG/ML  SOLN ; contrast volume was reduced due to abdominal CT having been performed earlier same date.  Comparison: Chest radiographs 07/16/2012.  Abdominal CT today.  Findings: There is a large intraluminal filling defect within the proximal thoracic esophagus. This measures approximately 2.7 x 3.3 cm transverse and 5.4 cm cephalocaudad.  The esophagus is distended proximal to this mass.  There is no gross extension of tumor beyond the walls of the esophagus.  The distal esophagus is decompressed.  No enlarged lymph nodes are seen immediately adjacent to the esophageal lesion.  However, there are soft tissue nodules within both tracheoesophageal grooves superior to the mass, just inferior to the thyroid gland.  These measure 9 mm short axis on the right and 11 mm on the left (image 12).  There are faint coronary artery calcifications.  There is no pleural or pericardial effusion.  Mild emphysematous changes are present throughout the lungs.  There is minimal patchy peribronchial opacity in the lingula on image number 34.  This has a tree-in-bud appearance and is likely inflammatory.  The visualized upper abdomen appears unremarkable.  There is contrast material within the renal collecting systems related to the preceding abdominal CT.  IMPRESSION:  1.  Large intraluminal mass involving the proximal thoracic esophagus suspicious for esophageal malignancy.  Correlate clinically. 2.  Nonspecific soft tissue nodules in both tracheoesophageal grooves, potentially lymph nodes or parathyroid lesions. 3.  Emphysema with mild tree in bud peribronchial opacity in the lingula.   Original Report Authenticated By: Gerrianne Scale, M.D.    Ct Abdomen Pelvis W Contrast  09/13/2012  *RADIOLOGY REPORT*  Clinical Data: Esophageal mass, history hypertension, GERD  CT ABDOMEN AND PELVIS WITH  CONTRAST  Technique:  Multidetector CT imaging of the abdomen and pelvis was performed following the standard protocol during bolus administration of intravenous contrast. Sagittal and coronal MPR images reconstructed from axial data set.  Contrast: OMNIPAQUE IOHEXOL 300 MG/ML  SOLN Dilute oral contrast.  Comparison: None  Findings: Minimal dependent atelectasis right lower lobe. Liver, spleen, pancreas, kidneys, and adrenal glands normal. Gaseous distention of the stomach. Question minimal wall thickening versus underdistension at the pylorus. Scattered atherosclerotic calcifications aorta without aneurysm. Bowel loops unremarkable. No mass, adenopathy, free fluid, or free air. Normal appendix. Scattered endplate spur formation thoracic and lumbar spine.  IMPRESSION: Questionable bowel wall thickening of pylorus versus artifact from underdistension. No acute intra-abdominal or intrapelvic abnormalities. Gaseous distention of stomach likely related to preceding upper endoscopy.   Original Report Authenticated By: Lollie Marrow, M.D.  Recent Lab Findings: Lab Results  Component Value Date   WBC 19.0* 09/20/2012   HGB 15.5 09/20/2012   HCT 46.5 09/20/2012   PLT 277 09/20/2012   GLUCOSE 70 09/21/2012   CHOL 148 07/30/2012   TRIG 91 07/30/2012   HDL 54 07/30/2012   LDLCALC 76 07/30/2012   ALT 5 09/20/2012   AST 10 09/20/2012   NA 136 09/21/2012   K 4.5 09/21/2012   CL 95* 09/21/2012   CREATININE 1.96* 09/21/2012   BUN 51* 09/21/2012   CO2 17* 09/21/2012      Assessment / Plan:     #1 squamous cell carcinoma of the proximal third of the esophagus, stage not fully evaluated yet. Because of the location of the tumor the patient is at risk of developing tracheoesophageal extension of tumor and possible fistula.  #2 severe malnutrition #3 stage III renal failure  At this point the patient appears to be a very poor candidate for surgical resection, primarily because of the severely  cachectic condition but in addition he has not been fully staged a PET scan is pending, depending on the results of this we may consider esophageal ultrasound to completely accurately stage the tumor and nodes.  The patient is reluctant to return after the results of the PET scan however I will call him and discuss it and make further recommendations based on the scan.    Delight Ovens MD  Beeper [phone] Office [phone] 10/01/2012 5:29 PM

## 2012-10-02 ENCOUNTER — Ambulatory Visit (HOSPITAL_COMMUNITY): Payer: Self-pay | Admitting: Dentistry

## 2012-10-02 ENCOUNTER — Encounter (HOSPITAL_COMMUNITY): Payer: Self-pay | Admitting: Dentistry

## 2012-10-02 VITALS — BP 117/66 | HR 86 | Temp 98.6°F

## 2012-10-02 DIAGNOSIS — K0820 Unspecified atrophy of edentulous alveolar ridge: Secondary | ICD-10-CM

## 2012-10-02 DIAGNOSIS — M2607 Excessive tuberosity of jaw: Secondary | ICD-10-CM

## 2012-10-02 DIAGNOSIS — C159 Malignant neoplasm of esophagus, unspecified: Secondary | ICD-10-CM

## 2012-10-02 DIAGNOSIS — Z0189 Encounter for other specified special examinations: Secondary | ICD-10-CM

## 2012-10-02 DIAGNOSIS — K08109 Complete loss of teeth, unspecified cause, unspecified class: Secondary | ICD-10-CM | POA: Insufficient documentation

## 2012-10-02 NOTE — Patient Instructions (Addendum)

## 2012-10-02 NOTE — Progress Notes (Signed)
DENTAL CONSULTATION  Date of Consultation:  10/02/2012 Patient Name:   Eddie [name] Date of Birth:   [birth year] Medical Record Number: [id number]  VITALS: BP 117/66  Pulse 86  Temp 98.6 F (37 C) (Oral)   CHIEF COMPLAINT: Patient referred for a pre-chemoradiation therapy dental protocol evaluation.  HPI: DIETRICH [name] is a 66 year old male recently diagnosed with esophageal cancer. Patient with options of possible surgical resection, chemotherapy, and/or radiation therapy. Patient is now seen as part of a pre-chemoradiation therapy dental protocol evaluation.  Patient indicates that he is edentulous. Patient indicates that he does not have any dentures.  Patient last saw a dentist approximately 6 years ago to have his last teeth extracted. Patient denies any complications from that dental extraction. This is performed by dentist in [place], [place]. Patient indicates that prior to the esophageal cancer, he had been eating well.    PMH: Past Medical History  Diagnosis Date  . Hypertension   . Chronic back pain   . GERD (gastroesophageal reflux disease)   . Squamous cell carcinoma Oct 2014    Esophogus  . Squamous cell carcinoma of esophagus 09/25/2012    PSH: Past Surgical History  Procedure Date  . Peg tube placement 09/21/2012  . Esophagogastroduodenoscopy 09/21/2012    Procedure: ESOPHAGOGASTRODUODENOSCOPY (EGD);  Surgeon: Malissa Hippo, MD;  [facility];  Service: Endoscopy;  Laterality: N/A;  345  . Peg placement 09/21/2012    Procedure: PERCUTANEOUS ENDOSCOPIC GASTROSTOMY (PEG) PLACEMENT;  Surgeon: Malissa Hippo, MD;  [facility];  Service: Endoscopy;  Laterality: N/A;    ALLERGIES: Allergies  Allergen Reactions  . Motrin (Ibuprofen) Swelling    MEDICATIONS: Current Outpatient Prescriptions  Medication Sig Dispense Refill  . Nutritional Supplements (JEVITY PO) by PEG Tube route 4 (four) times daily. 1 8oz can 4 x daily      .  oxyCODONE (ROXICODONE) 5 MG/5ML solution Take 5 mLs (5 mg total) by mouth every 4 (four) hours as needed for pain.  250 mL  0  . sodium chloride 0.9 % injection Place 75 mLs into feeding tube as needed.  1500 mL  0    LABS: Lab Results  Component Value Date   WBC 19.0* 09/20/2012   HGB 15.5 09/20/2012   HCT 46.5 09/20/2012   MCV 97.5 09/20/2012   PLT 277 09/20/2012      Component Value Date/Time   NA 136 09/21/2012 1001   K 4.5 09/21/2012 1001   CL 95* 09/21/2012 1001   CO2 17* 09/21/2012 1001   GLUCOSE 70 09/21/2012 1001   BUN 51* 09/21/2012 1001   CREATININE 1.96* 09/21/2012 1001   CREATININE 1.12 07/30/2012 0950   CALCIUM 10.3 09/21/2012 1001   GFRNONAA 34* 09/21/2012 1001   GFRAA 40* 09/21/2012 1001   No results found for this basename: INR, PROTIME   No results found for this basename: PTT    SOCIAL HISTORY: History   Social History  . Marital Status: Married    Spouse Name: N/A    Number of Children: N/A  . Years of Education: N/A   Occupational History  . Not on file.   Social History Main Topics  . Smoking status: Current Every Day Smoker -- 1.0 packs/day for 50 years  . Smokeless tobacco: Never Used   Comment: 1/2 to 1 pack x50 yrs.  . Alcohol Use: 3.6 oz/week    6 Shots of liquor per week     twice a week  .  Drug Use: No  . Sexually Active: Not on file   Other Topics Concern  . Not on file   Social History Narrative  . No narrative on file    FAMILY HISTORY: Family History  Problem Relation Age of Onset  . Stroke Mother   . Hypertension Father   . Cancer Brother     Throat cancer  . Kidney disease Daughter      REVIEW OF SYSTEMS: Reviewed from Dr. Dennie Maizes note from 10/01/12 with no acute changes.  DENTAL HISTORY: CHIEF COMPLAINT: Patient referred for a pre-chemoradiation therapy dental protocol evaluation.  HPI: Eddie [name] is a 66 year old male recently diagnosed with esophageal cancer. Patient with options of possible  surgical resection, chemotherapy, and/or radiation therapy. Patient is now seen as part of a pre-chemoradiation therapy dental protocol evaluation.  Patient indicates that he is edentulous. Patient indicates that he does not have any dentures.  Patient last saw a dentist approximately 6 years ago to have his last teeth extracted. Patient denies any complications from that dental extraction. This is performed by dentist in [place], [place]. Patient indicates that prior to the esophageal cancer, he had been eating well.   DENTAL EXAMINATION:  GENERAL: Asian well-developed, slightly built male in no acute distress. HEAD AND NECK: The patient has left neck lymphadenopathy in the submandibular area. There is no right neck lymphadenopathy. The patient denies acute TMJ symptoms. INTRAORAL EXAM: Patient has normal saliva. The patient is edentulous. The patient has flabby bilateral maxillary tuberosity. There is atrophy of the edentulous alveolar ridges. DENTITION: Patient is edentulous. PROSTHODONTIC: Patient has no dentures. OCCLUSION: Poor occlusal scheme.  RADIOGRAPHIC INTERPRETATION: Orthopantogram was obtained today. Patient is edentulous. There is no evidence of retained root tips or impacted teeth. There is atrophy of edentulous alveolar ridges.   ASSESSMENTS: 1. Patient is edentulous. 2. Atrophy of the edentulous alveolar ridges 3. Flabby tissues associated with the bilateral maxillary tuberosities 4. No history of dentures 5. Normal saliva    PLAN/RECOMMENDATIONS: 1. I discussed the risks, benefits, and complications of various treatment options with the patient in relationship to the medical and dental conditions. We discussed various treatment options to include no treatment,  pre-prosthetic surgery as indicated, implant therapy, and replacement of missing teeth as indicated. The patient currently wishes to defer any dental treatment at this time.  Patient will contact dental  medicine if he wishes to be evaluated for dentures in the future.  Patient also was provided the names of several dentist in [facility] area that could assist in denture fabrication.   2. Discussion of findings with medical team and coordination of future medical and dental care. Patient is cleared for chemoradiation therapy at this time.   Charlynne Pander, DDS

## 2012-10-03 ENCOUNTER — Encounter (HOSPITAL_COMMUNITY)
Admission: RE | Admit: 2012-10-03 | Discharge: 2012-10-03 | Disposition: A | Discharge status: Home / Self Care | Payer: Medicare Other | Source: Ambulatory Visit | Attending: Oncology | Admitting: Oncology

## 2012-10-03 ENCOUNTER — Encounter (HOSPITAL_COMMUNITY): Payer: Self-pay

## 2012-10-03 ENCOUNTER — Other Ambulatory Visit (HOSPITAL_COMMUNITY): Payer: Self-pay | Admitting: Oncology

## 2012-10-03 DIAGNOSIS — C159 Malignant neoplasm of esophagus, unspecified: Secondary | ICD-10-CM | POA: Insufficient documentation

## 2012-10-03 LAB — GLUCOSE, CAPILLARY: Glucose-Capillary: 95 mg/dL (ref 70–99)

## 2012-10-03 MED ORDER — FLUDEOXYGLUCOSE F - 18 (FDG) INJECTION
15.3000 | Freq: Once | INTRAVENOUS | Status: AC | PRN
  Administered 2012-10-03: 15.3 via INTRAVENOUS

## 2012-10-04 ENCOUNTER — Telehealth: Payer: Self-pay

## 2012-10-04 NOTE — Telephone Encounter (Signed)
He needs to call GI- Dr. Karilyn Cota, I was understanding everything was going through Peg Tube.  He can use colace 100mg  over the counter twice a day for stool softener.

## 2012-10-04 NOTE — Telephone Encounter (Signed)
[phone] carrie

## 2012-10-04 NOTE — Telephone Encounter (Signed)
Wants to know if Eddie [name] can have anything by mouth. He said nobody has told him either way so he has only been taking sips but seems to get choked on thin liquids. She said he may have to have a thickener if he is allowed anything po. Also wants to know what stool softner would be ok for his to use daily

## 2012-10-05 NOTE — Telephone Encounter (Signed)
Patient aware.

## 2012-10-09 ENCOUNTER — Encounter (HOSPITAL_COMMUNITY): Payer: Medicare Other | Attending: Oncology | Admitting: Oncology

## 2012-10-09 ENCOUNTER — Encounter (HOSPITAL_COMMUNITY): Payer: Self-pay | Admitting: Oncology

## 2012-10-09 VITALS — BP 90/61 | HR 76 | Temp 98.0°F | Wt 105.7 lb

## 2012-10-09 DIAGNOSIS — B370 Candidal stomatitis: Secondary | ICD-10-CM | POA: Insufficient documentation

## 2012-10-09 DIAGNOSIS — C159 Malignant neoplasm of esophagus, unspecified: Secondary | ICD-10-CM | POA: Insufficient documentation

## 2012-10-09 DIAGNOSIS — R112 Nausea with vomiting, unspecified: Secondary | ICD-10-CM | POA: Insufficient documentation

## 2012-10-09 DIAGNOSIS — R64 Cachexia: Secondary | ICD-10-CM

## 2012-10-09 DIAGNOSIS — R1310 Dysphagia, unspecified: Secondary | ICD-10-CM

## 2012-10-09 NOTE — Patient Instructions (Signed)
[facility]  Discharge Instructions  RECOMMENDATIONS MADE BY THE CONSULTANT AND ANY TEST RESULTS WILL BE SENT TO YOUR REFERRING DOCTOR.   SPECIAL INSTRUCTIONS/FOLLOW-UP: Return to Clinic on as scheduled for your chemotherapy.  You will report to short stay in the morning at 8:00am for PICC line placement for your therapy.  We will see you this week for chemotherapy teaching and actual treatment.  You will also be starting radiation this week, we will be sure to schedule you so you can do both.  Please call the clinic with any questions or concerns.     I acknowledge that I have been informed and understand all the instructions given to me and received a copy. I do not have any more questions at this time, but understand that I may call the [facility]ty [facility] at [facility] at [phone] during business hours should I have any further questions or need assistance in obtaining follow-up care.    __________________________________________  _____________  __________ Signature of Patient or Authorized Representative            Date                   Time    __________________________________________ Nurse's Signature

## 2012-10-09 NOTE — Progress Notes (Signed)
Eddie Antis, MD [address], [address] [place] [address]  1. Squamous cell carcinoma of esophagus     CURRENT THERAPY: Preparation for chemoradiation with 5 FU 400mg /m2/day continuous infusion with weekly pump changes  INTERVAL HISTORY: Eddie [name] 66 y.o. male returns for  regular  visit for followup of  Esophageal squamous cell carcinoma  Eddie [name] has undergone a PET scan for complete staging.  The report follows, but no distant metastatic disease is noted.  Primary mass with mildly hypermetabolic activity appreciated with high paratracheal lymph nodes appreciated.    Eddie [name] was seen by Dr. Kristin Bruins and he is cleared for radiation therapy from a dental standpoint.   He has also seen by Dr. Sheliah Plane for consideration of surgery.  The patient is not a good surgical candidate per consult note.   We have completed the patient's work-up and now he will pursue radiation under Dr. Bernerd Pho care.  He is scheduled for Radiation to begin on Thursday.  Therefore, we will develop a chemotherapy plan consisting of 5 FU continuous infusion with weekly pump changes.  This plan was built.  We discussed the risks, benefits, alternatives, and side effects of therapy.  We will get a PICC placed for chemotherapy administration.  Past Medical History  Diagnosis Date  . Hypertension   . Chronic back pain   . GERD (gastroesophageal reflux disease)   . Squamous cell carcinoma Oct 2014    Esophogus  . Squamous cell carcinoma of esophagus 09/25/2012    has Essential hypertension, benign; Alcohol use; Dysphagia; Emphysema; Tobacco use; Squamous cell carcinoma of esophagus; and Edentulous on his problem list.     is allergic to motrin.  Eddie [name] does not currently have medications on file.  Past Surgical History  Procedure Date  . Peg tube placement 09/21/2012  . Esophagogastroduodenoscopy 09/21/2012    Procedure: ESOPHAGOGASTRODUODENOSCOPY (EGD);  Surgeon: Malissa Hippo, MD;  [facility]:  [facility];  Service: Endoscopy;  Laterality: N/A;  345  . Peg placement 09/21/2012    Procedure: PERCUTANEOUS ENDOSCOPIC GASTROSTOMY (PEG) PLACEMENT;  Surgeon: Malissa Hippo, MD;  Location: [facility];  Service: Endoscopy;  Laterality: N/A;    Denies any headaches, dizziness, double vision, fevers, chills, night sweats, nausea, vomiting, diarrhea, constipation, chest pain, heart palpitations, shortness of breath, blood in stool, black tarry stool, urinary pain, urinary burning, urinary frequency, hematuria.   PHYSICAL EXAMINATION  ECOG PERFORMANCE STATUS: 2 - Symptomatic, <50% confined to bed  Filed Vitals:   10/09/12 1500  BP: 90/61  Pulse: 76  Temp: 98 F (36.7 C)    GENERAL:alert, no distress, cachectic, malnourished and smiling SKIN: skin color, texture, turgor are normal, no rashes or significant lesions HEAD: Normocephalic, No masses, lesions, tenderness or abnormalities EYES: normal, Conjunctiva are pink and non-injected EARS: External ears normal OROPHARYNX:mucous membranes are moist  NECK: supple, trachea midline LYMPH:  not examined BREAST:not examined LUNGS: clear to auscultation  HEART: regular rate & rhythm ABDOMEN:abdomen soft, non-tender and normal bowel sounds BACK: Back symmetric, no curvature. EXTREMITIES:less then 2 second capillary refill, no skin discoloration, no cyanosis  NEURO: alert & oriented x 3 with fluent speech, no focal motor/sensory deficits, gait normal  LABORATORY DATA: CBC    Component Value Date/Time   WBC 19.0* 09/20/2012 1238   RBC 4.77 09/20/2012 1238   HGB 15.5 09/20/2012 1238   HCT 46.5 09/20/2012 1238   PLT 277 09/20/2012 1238   MCV 97.5 09/20/2012 1238   MCH 32.5  09/20/2012 1238   MCHC 33.3 09/20/2012 1238   RDW 11.9 09/20/2012 1238   LYMPHSABS 2.1 09/20/2012 1238   MONOABS 1.7* 09/20/2012 1238   EOSABS 0.0 09/20/2012 1238   BASOSABS 0.1 09/20/2012 1238     RADIOGRAPHIC STUDIES: 10/03/2012  *RADIOLOGY  REPORT*  Clinical Data: Initial treatment strategy for esophageal cancer.  NUCLEAR MEDICINE PET SKULL BASE TO THIGH  Fasting Blood Glucose: 95  Technique: 15.3 mCi F-18 FDG was injected intravenously. CT data  was obtained and used for attenuation correction and anatomic  localization only. (This was not acquired as a diagnostic CT  examination.) Additional exam technical data entered on  technologist worksheet.  Comparison: CT chest abdomen pelvis 09/13/2012.  Findings:  Neck: A fair amount of misregistration artifact is noted. No  abnormal hypermetabolism. CT images show no acute findings.  Chest: An 11 mm high left paratracheal lymph node has an S U V max  of 4.6. A 7 mm high right paratracheal lymph node (CT image 67) is  likely mildly hypermetabolic as well.  Upper esophageal mass has an S U V max of 17.7. No additional  areas of abnormal hypermetabolism in the chest. CT images show no  intervening acute findings such as pericardial or pleural effusion.  Abdomen/Pelvis: No abnormal hypermetabolic activity within the  liver, pancreas, adrenal glands, or spleen. No hypermetabolic  lymph nodes in the abdomen or pelvis. CT images show no acute  findings.  Skeleton: No focal hypermetabolic activity to suggest skeletal  metastasis.  IMPRESSION:  Hypermetabolic primary esophageal mass with mildly hypermetabolic  high paratracheal lymph nodes. No evidence of distant metastatic  disease.  Original Report Authenticated By: Eddie [name], M.D.    PATHOLOGY: 09/13/2012  Diagnosis  Esophagus, biopsy, mass  SQUAMOUS CELL CARCINOMA. PLEASE SEE COMMENT.  Microscopic Comment  The biopsies are composed of superficial fragments of invasive moderately differentiated squamous cell  carcinoma with associated brisk mitotic activity, cytologic atypia and focal keratin pearl formation. There is no  evidence of angiolymphatic invasion or perineural invasion identified in this material. The  case was discussed  with Dr. Karilyn Cota on 09/17/12. (HCL:caf 09/17/12)  Eddie Miyamoto MD  Pathologist, Electronic Signature  (Case signed 09/17/2012)     ASSESSMENT:  1. Squamous cell carcinoma of esophagus.  2. Dysphagia  3. Cachexia  4. H/O EtOHism  5. Tobacco abuse 50 pack years   PLAN:  1. I personally reviewed and went over radiographic studies with the patient. 2. 5 FU IV CI plan built at a dose of 400 mg/m2/day with weekly pump changes while undergoing XRT. 3. Above mentioned treatment plan built and signed. 4. XRT as scheduled on Thursday. 5. Chemotherapy teaching will be scheduled.  6. PICC placement for chemotherapy administration. 7. Will start therapy on Thursday. 8. Pre-chemo therapy lab work ordered 9. Return in 3 weeks for follow-up   All questions were answered. The patient knows to call the clinic with any problems, questions or concerns. We can certainly see the patient much sooner if necessary.  The patient and plan discussed with Eddie Peers, MD and he is in agreement with the aforementioned.  Eddie [name],Eddie [name]

## 2012-10-10 ENCOUNTER — Ambulatory Visit (HOSPITAL_COMMUNITY)
Admission: RE | Admit: 2012-10-10 | Discharge: 2012-10-10 | Disposition: A | Discharge status: Home / Self Care | Payer: Medicare Other | Source: Ambulatory Visit | Attending: Oncology | Admitting: Oncology

## 2012-10-10 DIAGNOSIS — Z5189 Encounter for other specified aftercare: Secondary | ICD-10-CM | POA: Insufficient documentation

## 2012-10-10 DIAGNOSIS — C159 Malignant neoplasm of esophagus, unspecified: Secondary | ICD-10-CM | POA: Insufficient documentation

## 2012-10-10 MED ORDER — SODIUM CHLORIDE 0.9 % IJ SOLN: 10.0000 mL | Freq: Two times a day (BID) | INTRAMUSCULAR | Status: DC

## 2012-10-10 MED ORDER — SODIUM CHLORIDE 0.9 % IJ SOLN: 10.0000 mL | INTRAMUSCULAR | Status: DC | PRN

## 2012-10-10 MED ORDER — PROCHLORPERAZINE 25 MG RE SUPP: 25.0000 mg | Freq: Four times a day (QID) | RECTAL | Status: DC | PRN

## 2012-10-10 MED ORDER — ONDANSETRON HCL 8 MG PO TABS: 8.0000 mg | ORAL_TABLET | Freq: Two times a day (BID) | ORAL | Status: DC | PRN

## 2012-10-10 MED ORDER — DEXAMETHASONE 4 MG PO TABS: ORAL_TABLET | ORAL | Status: DC

## 2012-10-10 NOTE — Patient Instructions (Addendum)
[facility] ELGAR SCOGGINS  DOB May 02, 1946 CSN [id number]  MRN [id number] Dr. Glenford Peers    CHEMOTHERAPY INSTRUCTIONS  5FU: bone marrow suppression (low white blood cells - wbcs fight infection, low red blood cells - rbcs make up your blood, low platelets - this is what makes your blood clot, nausea/vomiting, diarrhea, mouth sores, hair loss, dry skin, ocular toxicities (increased tear production, sensitivity to light). You must wear sunscreen/sunglasses. Cover your skin when out in sunlight. You will get burned very easily.    POTENTIAL SIDE EFFECTS OF TREATMENT: Increased Susceptibility to Infection, Vomiting, Hair Thinning, Changes in Character of Skin and Nails (brittleness, dryness,etc.), Bone Marrow Suppression, Nausea, Diarrhea, Sun Sensitivity and Mouth Sores   SELF IMAGE NEEDS AND REFERRALS MADE: Obtain hair accessories as soon as possible (hats)   EDUCATIONAL MATERIALS GIVEN AND REVIEWED: Chemotherapy and You Specific Instruction Sheets on 5FU, Dexamethasone, Compazine suppository, Zofran, EMLA cream  SELF CARE ACTIVITIES WHILE ON CHEMOTHERAPY: Increase your fluid intake 48 hours prior to treatment and drink at least 2 quarts per day after treatment., No alcohol intake., No aspirin or other medications unless approved by your oncologist., Eat foods that are light and easy to digest., Eat foods at cold or room temperature., No fried, fatty, or spicy foods immediately before or after treatment., Have teeth cleaned professionally before starting treatment. Keep dentures and partial plates clean., Use soft toothbrush and do not use mouthwashes that contain alcohol. Biotene is a good mouthwash that is available at most pharmacies or may be ordered by calling [phone]., Use warm salt water gargles (1 teaspoon salt per 1 quart warm water) before and after meals and at bedtime. Or you may rinse with 2 tablespoons of three -percent hydrogen peroxide  mixed in eight ounces of water., Always use sunscreen with SPF (Sun Protection Factor) of 30 or higher., Use your nausea medication as directed to prevent nausea., Use your stool softener or laxative as directed to prevent constipation. and Use your anti-diarrheal medication as directed to stop diarrhea.  Please wash your hands for at least 30 seconds using warm soapy water. Handwashing is the #1 way to prevent the spread of germs. Stay away from sick people or people who are getting over a cold. If you develop respiratory systems such as green/yellow mucus production or productive cough or persistent cough let us know and we will see if you need an antibiotic. It is a good idea to keep a pair of gloves on when going into grocery stores/[employer] to decrease your risk of coming into contact with germs on the carts, etc. Carry alcohol hand gel with you at all times and use it frequently if out in public. All foods need to be cooked thoroughly. No raw foods. No medium or undercooked meats, eggs. If your food is cooked medium well, it does not need to be hot pink or saturated with bloody liquid at all. Vegetables and fruits need to be washed/rinsed under the faucet with a dish detergent before being consumed. You can eat raw fruits and vegetables unless we tell you otherwise but it would be best if you cooked them or bought frozen. Do not eat off of salad bars or hot bars unless you really trust the cleanliness of the restaurant. If you need dental work, please let Dr. Mariel Sleet know before you go for your appointment so that we can coordinate the best possible time for you in regards to your chemo regimen. You need  to also let your dentist know that you are actively taking chemo. We may need to do labs prior to your dental appointment. We also want your bowels moving at least every other day. If this is not happening, we need to know so that we can get you on a bowel regimen to help you go.     MEDICATIONS: You  have been given prescriptions for the following medications:  Dexamethasone 4mg  tablet. Starting the day after chemo, take 2 tablets in the am and 2 tablets in the pm for 2 days. This medication will need to be crushed and put through your PEG tube if you can not swallow. [facility] has pill crushers available for a few dollars per a pharmacist there. This medication is being given to reduce/prevent nausea and to generally make you feel better. You may notice that while taking this steroid you feel more energized, may be irritable/emotional/moody, or may have trouble sleeping. This will pass when you stop taking the medication.  Zofran 8mg  tablet. May take 1 tablet two times a day IF needed for nausea/vomiting. Let this dissolve on your tongue. Side Effect: constipation  Compazine 25mg  suppository. Insert 1 suppository rectally every 6 hours IF needed for nausea/vomiting.   Over-the-Counter Meds:  Colace - this is a stool softener. Take 100mg  capsule 2-6 times a day as needed. If you have to take more than 6 capsules of Colace a day call the Cancer Center.  Senna - this is a mild laxative used to treat mild constipation. May take 2 tabs by mouth daily or up to twice a day as needed for mild constipation.  Milk of Magnesia - this is a laxative used to treat moderate to severe constipation. May take 2-4 tablespoons every 8 hours as needed. May increase to 8 tablespoons x 1 dose and if no bowel movement call the Cancer Center.  Imodium - this is for diarrhea. Take 2 tabs after 1st loose stool and 1 tab every 2 hours until you go 12 hours without a loose stool. Call Cancer Center if loose stools continue.  SYMPTOMS TO REPORT AS SOON AS POSSIBLE AFTER TREATMENT:  FEVER GREATER THAN 100.5 F  CHILLS WITH OR WITHOUT FEVER  NAUSEA AND VOMITING THAT IS NOT CONTROLLED WITH YOUR NAUSEA MEDICATION  UNUSUAL SHORTNESS OF BREATH  UNUSUAL BRUISING OR BLEEDING  TENDERNESS IN MOUTH AND THROAT  WITH OR WITHOUT PRESENCE OF ULCERS  URINARY PROBLEMS  BOWEL PROBLEMS  UNUSUAL RASH    Wear comfortable clothing and clothing appropriate for easy access to any Portacath or PICC line. Let us know if there is anything that we can do to make your therapy better!      I have been informed and understand all of the instructions given to me and have received a copy. I have been instructed to call the clinic [phone] or my family physician as soon as possible for continued medical care, if indicated. I do not have any more questions at this time but understand that I may call the Cancer Center or the Patient Navigator at [phone] during office hours should I have questions or need assistance in obtaining follow-up care.      _________________________________________      _______________     __________ Signature of Patient or Authorized Representative        Date  Time      _________________________________________ Nurse's Signature

## 2012-10-10 NOTE — Progress Notes (Signed)
picc placement for chemotherapy

## 2012-10-10 NOTE — Progress Notes (Signed)
Patient has esophogeal cancer

## 2012-10-11 ENCOUNTER — Encounter (HOSPITAL_BASED_OUTPATIENT_CLINIC_OR_DEPARTMENT_OTHER): Payer: Medicare Other

## 2012-10-11 ENCOUNTER — Encounter (HOSPITAL_COMMUNITY): Payer: Medicare Other

## 2012-10-11 ENCOUNTER — Other Ambulatory Visit (HOSPITAL_COMMUNITY): Payer: Self-pay | Admitting: Oncology

## 2012-10-11 VITALS — BP 111/73 | HR 76 | Temp 98.0°F | Resp 16 | Wt 108.2 lb

## 2012-10-11 DIAGNOSIS — C159 Malignant neoplasm of esophagus, unspecified: Secondary | ICD-10-CM

## 2012-10-11 DIAGNOSIS — Z5111 Encounter for antineoplastic chemotherapy: Secondary | ICD-10-CM

## 2012-10-11 LAB — CBC WITH DIFFERENTIAL/PLATELET
Eosinophils Absolute: 0.1 10*3/uL (ref 0.0–0.7)
MCH: 32.1 pg (ref 26.0–34.0)
MCHC: 33.6 g/dL (ref 30.0–36.0)
MCV: 95.4 fL (ref 78.0–100.0)
Metamyelocytes Relative: 0 %
Myelocytes: 0 %
Neutro Abs: 10 10*3/uL — ABNORMAL HIGH (ref 1.7–7.7)
Neutrophils Relative %: 72 % (ref 43–77)
Platelets: 299 10*3/uL (ref 150–400)
Promyelocytes Absolute: 0 %
RDW: 11.4 % — ABNORMAL LOW (ref 11.5–15.5)
nRBC: 0 /100 WBC

## 2012-10-11 LAB — COMPREHENSIVE METABOLIC PANEL
AST: 14 U/L (ref 0–37)
Albumin: 4 g/dL (ref 3.5–5.2)
Alkaline Phosphatase: 80 U/L (ref 39–117)
BUN: 46 mg/dL — ABNORMAL HIGH (ref 6–23)
CO2: 28 mEq/L (ref 19–32)
Chloride: 97 mEq/L (ref 96–112)
Creatinine, Ser: 1.34 mg/dL (ref 0.50–1.35)
GFR calc non Af Amer: 54 mL/min — ABNORMAL LOW (ref >90)
Potassium: 4.8 mEq/L (ref 3.5–5.1)
Total Bilirubin: 0.5 mg/dL (ref 0.3–1.2)

## 2012-10-11 MED ORDER — SODIUM CHLORIDE 0.9 % IV SOLN
400.0000 mg/m2/d | INTRAVENOUS | Status: DC
  Administered 2012-10-11: 4300 mg via INTRAVENOUS
  Filled 2012-10-11: qty 86

## 2012-10-11 MED ORDER — SODIUM CHLORIDE 0.9 % IJ SOLN
INTRAMUSCULAR | Status: AC
  Filled 2012-10-11: qty 20

## 2012-10-11 NOTE — Progress Notes (Signed)
Chemo teaching done and consent signed for 5FU. Sister-in-law present also for teaching. Chemo spill kill given to patient. Masks and gloves given to patient. Thermometer given to patient.   Compazine tablet & suppository ordered, dexamethasone ordered, zofran odt ordered - all drugs called into [facility].   Teaching was completed before chemo was started.

## 2012-10-12 ENCOUNTER — Telehealth (HOSPITAL_COMMUNITY): Payer: Self-pay

## 2012-10-18 ENCOUNTER — Encounter (HOSPITAL_BASED_OUTPATIENT_CLINIC_OR_DEPARTMENT_OTHER): Payer: Medicare Other

## 2012-10-18 VITALS — BP 105/74 | HR 93 | Temp 98.5°F | Resp 18 | Wt 104.0 lb

## 2012-10-18 DIAGNOSIS — Z5111 Encounter for antineoplastic chemotherapy: Secondary | ICD-10-CM

## 2012-10-18 DIAGNOSIS — C159 Malignant neoplasm of esophagus, unspecified: Secondary | ICD-10-CM

## 2012-10-18 LAB — CBC WITH DIFFERENTIAL/PLATELET
Basophils Absolute: 0 10*3/uL (ref 0.0–0.1)
Basophils Relative: 0 % (ref 0–1)
Eosinophils Absolute: 0.1 10*3/uL (ref 0.0–0.7)
Eosinophils Relative: 1 % (ref 0–5)
MCH: 32.5 pg (ref 26.0–34.0)
MCV: 95.6 fL (ref 78.0–100.0)
Neutrophils Relative %: 82 % — ABNORMAL HIGH (ref 43–77)
Platelets: 247 10*3/uL (ref 150–400)
RBC: 3.85 MIL/uL — ABNORMAL LOW (ref 4.22–5.81)
RDW: 11.4 % — ABNORMAL LOW (ref 11.5–15.5)
WBC: 11.7 10*3/uL — ABNORMAL HIGH (ref 4.0–10.5)

## 2012-10-18 MED ORDER — SODIUM CHLORIDE 0.9 % IV SOLN
Freq: Once | INTRAVENOUS | Status: AC
  Administered 2012-10-18: 09:00:00 via INTRAVENOUS

## 2012-10-18 MED ORDER — SODIUM CHLORIDE 0.9 % IV SOLN
400.0000 mg/m2/d | INTRAVENOUS | Status: DC
  Filled 2012-10-18: qty 86

## 2012-10-18 MED ORDER — SODIUM CHLORIDE 0.9 % IV SOLN
400.0000 mg/m2/d | INTRAVENOUS | Status: DC
  Administered 2012-10-18: 4300 mg via INTRAVENOUS
  Filled 2012-10-18: qty 86

## 2012-10-23 ENCOUNTER — Encounter (HOSPITAL_BASED_OUTPATIENT_CLINIC_OR_DEPARTMENT_OTHER): Payer: Medicare Other | Admitting: Oncology

## 2012-10-23 VITALS — BP 123/93 | HR 105 | Temp 98.4°F | Resp 18

## 2012-10-23 DIAGNOSIS — B370 Candidal stomatitis: Secondary | ICD-10-CM

## 2012-10-23 DIAGNOSIS — R112 Nausea with vomiting, unspecified: Secondary | ICD-10-CM

## 2012-10-23 DIAGNOSIS — C159 Malignant neoplasm of esophagus, unspecified: Secondary | ICD-10-CM

## 2012-10-23 MED ORDER — LORAZEPAM 0.5 MG PO TABS: 0.5000 mg | ORAL_TABLET | ORAL | Status: DC | PRN

## 2012-10-23 MED ORDER — FLUCONAZOLE 40 MG/ML PO SUSR: 100.0000 mg | Freq: Every day | ORAL | Status: DC

## 2012-10-23 MED ORDER — FIRST-DUKES MOUTHWASH MT SUSP: 5.0000 mL | Freq: Four times a day (QID) | OROMUCOSAL | Status: DC | PRN

## 2012-10-23 NOTE — Progress Notes (Signed)
Pt called and notified to stop taking Dex per Elijah Birk. Patient said ok.

## 2012-10-23 NOTE — Patient Instructions (Addendum)
[facility]  Discharge Instructions  RECOMMENDATIONS MADE BY THE CONSULTANT AND ANY TEST RESULTS WILL BE SENT TO YOUR REFERRING DOCTOR.   EXAM FINDINGS BY MD TODAY AND SIGNS AND SYMPTOMS TO REPORT TO CLINIC OR PRIMARY MD: exam and discussion by PA>  MEDICATIONS PRESCRIBED: Diflucan 2.5 ml daily  Duke's Magic Mouthwash use 5 ml 4 times daily (swish and swallow) Lorazepam  - let dissolve under the tongue every 4 hours as needed. Follow label directions     SPECIAL INSTRUCTIONS/FOLLOW-UP: Return to Clinic as scheduled on Thursday.   I acknowledge that I have been informed and understand all the instructions given to me and received a copy. I do not have any more questions at this time, but understand that I may call the Specialty Clinic at [facility] at [phone] during business hours should I have any further questions or need assistance in obtaining follow-up care.    __________________________________________  _____________  __________ Signature of Patient or Authorized Representative            Date                   Time    __________________________________________ Nurse's Signature

## 2012-10-23 NOTE — Addendum Note (Signed)
Addended by: Oda Kilts on: 10/23/2012 09:31 AM   Modules accepted: Orders

## 2012-10-23 NOTE — Progress Notes (Signed)
Eddie [name] is seen as a walk-in today.  He complains of mouth soreness and pain.  On physical exam, he has widespread thrush in his oral cavity.  I would not be surprised if it has progressed down to his esophagus.  He has numerous oral plaques of white pigmentation.   He is not swallowing any pills or liquids at this time and is relying on his PEG tube.  I will give him Duke Mouthwash 5 mL four times per day as needed for oral thrush.  I have encouraged him to swallow the mouthwash as well if able, but if not, he may spit it back out.  I will also give him Diflucan liquid that he can introduce through his PEG tube to treat esophageal candidiasis.  He may due this daily x 10 days or until thrush resolves.   He also is complaining of nausea and vomiting.  He is taking Zofran it sounds like ODT.  He is not taking compazine due to his inability to swallow pills.  This nausea and vomiting started 2-3 days ago.  So I will give him 0.5 mg Ativan SL every 4 hours PRN nausea and vomiting.    He has an appointment on Thursday for follow-up and we will keep this appointment to see if his thrush and nausea/vomiting is resolving or improving.    Will discontinue Dexamethasone pre-medication.  KEFALAS,THOMAS

## 2012-10-24 ENCOUNTER — Inpatient Hospital Stay (HOSPITAL_COMMUNITY)
Admission: EM | Admit: 2012-10-24 | Discharge: 2012-11-07 | DRG: 157 | Disposition: A | Discharge status: Skilled Nursing Facility | Payer: Medicare Other | Attending: Internal Medicine | Admitting: Internal Medicine

## 2012-10-24 ENCOUNTER — Emergency Department (HOSPITAL_COMMUNITY): Payer: Medicare Other

## 2012-10-24 ENCOUNTER — Encounter (HOSPITAL_COMMUNITY): Payer: Self-pay | Admitting: *Deleted

## 2012-10-24 DIAGNOSIS — E872 Acidosis, unspecified: Secondary | ICD-10-CM

## 2012-10-24 DIAGNOSIS — R1310 Dysphagia, unspecified: Secondary | ICD-10-CM

## 2012-10-24 DIAGNOSIS — N179 Acute kidney failure, unspecified: Secondary | ICD-10-CM | POA: Diagnosis present

## 2012-10-24 DIAGNOSIS — B370 Candidal stomatitis: Secondary | ICD-10-CM

## 2012-10-24 DIAGNOSIS — K529 Noninfective gastroenteritis and colitis, unspecified: Secondary | ICD-10-CM

## 2012-10-24 DIAGNOSIS — E43 Unspecified severe protein-calorie malnutrition: Secondary | ICD-10-CM

## 2012-10-24 DIAGNOSIS — I10 Essential (primary) hypertension: Secondary | ICD-10-CM

## 2012-10-24 DIAGNOSIS — Z9221 Personal history of antineoplastic chemotherapy: Secondary | ICD-10-CM

## 2012-10-24 DIAGNOSIS — Z681 Body mass index (BMI) 19 or less, adult: Secondary | ICD-10-CM

## 2012-10-24 DIAGNOSIS — R51 Headache: Secondary | ICD-10-CM | POA: Diagnosis present

## 2012-10-24 DIAGNOSIS — Z923 Personal history of irradiation: Secondary | ICD-10-CM

## 2012-10-24 DIAGNOSIS — T451X5A Adverse effect of antineoplastic and immunosuppressive drugs, initial encounter: Secondary | ICD-10-CM

## 2012-10-24 DIAGNOSIS — R531 Weakness: Secondary | ICD-10-CM

## 2012-10-24 DIAGNOSIS — Z87891 Personal history of nicotine dependence: Secondary | ICD-10-CM

## 2012-10-24 DIAGNOSIS — N289 Disorder of kidney and ureter, unspecified: Secondary | ICD-10-CM

## 2012-10-24 DIAGNOSIS — R1084 Generalized abdominal pain: Secondary | ICD-10-CM

## 2012-10-24 DIAGNOSIS — K5289 Other specified noninfective gastroenteritis and colitis: Secondary | ICD-10-CM | POA: Diagnosis present

## 2012-10-24 DIAGNOSIS — R739 Hyperglycemia, unspecified: Secondary | ICD-10-CM

## 2012-10-24 DIAGNOSIS — R070 Pain in throat: Secondary | ICD-10-CM

## 2012-10-24 DIAGNOSIS — E41 Nutritional marasmus: Secondary | ICD-10-CM | POA: Diagnosis present

## 2012-10-24 DIAGNOSIS — E870 Hyperosmolality and hypernatremia: Secondary | ICD-10-CM

## 2012-10-24 DIAGNOSIS — K269 Duodenal ulcer, unspecified as acute or chronic, without hemorrhage or perforation: Secondary | ICD-10-CM

## 2012-10-24 DIAGNOSIS — Z23 Encounter for immunization: Secondary | ICD-10-CM

## 2012-10-24 DIAGNOSIS — K219 Gastro-esophageal reflux disease without esophagitis: Secondary | ICD-10-CM | POA: Diagnosis present

## 2012-10-24 DIAGNOSIS — C159 Malignant neoplasm of esophagus, unspecified: Secondary | ICD-10-CM

## 2012-10-24 DIAGNOSIS — K5900 Constipation, unspecified: Secondary | ICD-10-CM | POA: Diagnosis present

## 2012-10-24 DIAGNOSIS — D72829 Elevated white blood cell count, unspecified: Secondary | ICD-10-CM

## 2012-10-24 DIAGNOSIS — R066 Hiccough: Secondary | ICD-10-CM

## 2012-10-24 DIAGNOSIS — K08109 Complete loss of teeth, unspecified cause, unspecified class: Secondary | ICD-10-CM

## 2012-10-24 DIAGNOSIS — E860 Dehydration: Secondary | ICD-10-CM

## 2012-10-24 DIAGNOSIS — D6481 Anemia due to antineoplastic chemotherapy: Secondary | ICD-10-CM | POA: Diagnosis present

## 2012-10-24 DIAGNOSIS — K1230 Oral mucositis (ulcerative), unspecified: Secondary | ICD-10-CM

## 2012-10-24 DIAGNOSIS — D696 Thrombocytopenia, unspecified: Secondary | ICD-10-CM

## 2012-10-24 DIAGNOSIS — F329 Major depressive disorder, single episode, unspecified: Secondary | ICD-10-CM | POA: Diagnosis present

## 2012-10-24 DIAGNOSIS — K121 Other forms of stomatitis: Principal | ICD-10-CM | POA: Diagnosis present

## 2012-10-24 DIAGNOSIS — D649 Anemia, unspecified: Secondary | ICD-10-CM

## 2012-10-24 DIAGNOSIS — D630 Anemia in neoplastic disease: Secondary | ICD-10-CM | POA: Diagnosis present

## 2012-10-24 DIAGNOSIS — R112 Nausea with vomiting, unspecified: Secondary | ICD-10-CM

## 2012-10-24 DIAGNOSIS — Z720 Tobacco use: Secondary | ICD-10-CM

## 2012-10-24 DIAGNOSIS — J439 Emphysema, unspecified: Secondary | ICD-10-CM

## 2012-10-24 DIAGNOSIS — F3289 Other specified depressive episodes: Secondary | ICD-10-CM | POA: Diagnosis present

## 2012-10-24 DIAGNOSIS — R1319 Other dysphagia: Secondary | ICD-10-CM | POA: Diagnosis present

## 2012-10-24 DIAGNOSIS — R339 Retention of urine, unspecified: Secondary | ICD-10-CM | POA: Diagnosis present

## 2012-10-24 DIAGNOSIS — F411 Generalized anxiety disorder: Secondary | ICD-10-CM | POA: Diagnosis present

## 2012-10-24 DIAGNOSIS — R509 Fever, unspecified: Secondary | ICD-10-CM

## 2012-10-24 DIAGNOSIS — D6959 Other secondary thrombocytopenia: Secondary | ICD-10-CM | POA: Diagnosis present

## 2012-10-24 DIAGNOSIS — A048 Other specified bacterial intestinal infections: Secondary | ICD-10-CM

## 2012-10-24 DIAGNOSIS — Z79899 Other long term (current) drug therapy: Secondary | ICD-10-CM

## 2012-10-24 DIAGNOSIS — R64 Cachexia: Secondary | ICD-10-CM

## 2012-10-24 HISTORY — DX: Other specified bacterial intestinal infections: A04.8

## 2012-10-24 HISTORY — DX: Unspecified severe protein-calorie malnutrition: E43

## 2012-10-24 HISTORY — DX: Duodenal ulcer, unspecified as acute or chronic, without hemorrhage or perforation: K26.9

## 2012-10-24 LAB — URINALYSIS, ROUTINE W REFLEX MICROSCOPIC
Bilirubin Urine: NEGATIVE
Glucose, UA: NEGATIVE mg/dL
Hgb urine dipstick: NEGATIVE
Protein, ur: NEGATIVE mg/dL

## 2012-10-24 LAB — COMPREHENSIVE METABOLIC PANEL
ALT: 11 U/L (ref 0–53)
AST: 12 U/L (ref 0–37)
Albumin: 3.3 g/dL — ABNORMAL LOW (ref 3.5–5.2)
CO2: 25 mEq/L (ref 19–32)
Chloride: 101 mEq/L (ref 96–112)
Creatinine, Ser: 1.21 mg/dL (ref 0.50–1.35)
Potassium: 4.4 mEq/L (ref 3.5–5.1)
Sodium: 138 mEq/L (ref 135–145)
Total Bilirubin: 0.5 mg/dL (ref 0.3–1.2)

## 2012-10-24 LAB — CBC WITH DIFFERENTIAL/PLATELET
Basophils Absolute: 0 10*3/uL (ref 0.0–0.1)
Basophils Relative: 0 % (ref 0–1)
Lymphocytes Relative: 5 % — ABNORMAL LOW (ref 12–46)
MCHC: 33.7 g/dL (ref 30.0–36.0)
Monocytes Absolute: 0.6 10*3/uL (ref 0.1–1.0)
Neutro Abs: 16 10*3/uL — ABNORMAL HIGH (ref 1.7–7.7)
Neutrophils Relative %: 91 % — ABNORMAL HIGH (ref 43–77)
Platelets: 128 10*3/uL — ABNORMAL LOW (ref 150–400)
RDW: 12 % (ref 11.5–15.5)
WBC: 17.7 10*3/uL — ABNORMAL HIGH (ref 4.0–10.5)

## 2012-10-24 LAB — LACTIC ACID, PLASMA: Lactic Acid, Venous: 1.8 mmol/L (ref 0.5–2.2)

## 2012-10-24 MED ORDER — ONDANSETRON HCL 4 MG/2ML IJ SOLN
4.0000 mg | Freq: Once | INTRAMUSCULAR | Status: AC
  Administered 2012-10-24: 4 mg via INTRAVENOUS
  Filled 2012-10-24: qty 2

## 2012-10-24 MED ORDER — SODIUM CHLORIDE 0.9 % IV BOLUS (SEPSIS)
1000.0000 mL | Freq: Once | INTRAVENOUS | Status: AC
  Administered 2012-10-24: 1000 mL via INTRAVENOUS

## 2012-10-24 MED ORDER — SODIUM CHLORIDE 0.9 % IV BOLUS (SEPSIS)
500.0000 mL | Freq: Once | INTRAVENOUS | Status: AC
  Administered 2012-10-24: 500 mL via INTRAVENOUS

## 2012-10-24 MED ORDER — LORAZEPAM 2 MG/ML IJ SOLN
0.5000 mg | Freq: Once | INTRAMUSCULAR | Status: AC
  Administered 2012-10-24: 0.5 mg via INTRAVENOUS
  Filled 2012-10-24: qty 1

## 2012-10-24 MED ORDER — HYDROMORPHONE HCL PF 1 MG/ML IJ SOLN
0.2500 mg | Freq: Once | INTRAMUSCULAR | Status: AC
  Administered 2012-10-24: 0.25 mg via INTRAVENOUS
  Filled 2012-10-24: qty 1

## 2012-10-24 MED ORDER — SODIUM CHLORIDE 0.9 % IV SOLN
INTRAVENOUS | Status: DC
  Administered 2012-10-24 – 2012-10-25 (×2): via INTRAVENOUS

## 2012-10-24 NOTE — ED Notes (Signed)
Vomiting, Pt has throat cancer, with a gastrostomy tube,  Weak

## 2012-10-24 NOTE — ED Notes (Addendum)
States he has been having nausea, vomiting and increased weakness.  Is on continuous chemotherapy/radiation.  Family at bedside states he has a history of throat cancer, states that his nausea/vomiting medications are not working.  Mucous membranes appear dry.  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

## 2012-10-24 NOTE — ED Notes (Signed)
Pt slow to respond, does not speak much when asked about his pain or condition.

## 2012-10-24 NOTE — ED Provider Notes (Signed)
History   This chart was scribed for Eddie Baker, MD by Sofie Rower, ED Scribe. The patient was seen in room APA14/[facility]14 and the patient's care was started at 7:32PM.     CSN: [id number]  Arrival date & time 10/24/12  1914   First MD Initiated Contact with Patient 10/24/12 1932      Chief Complaint  Patient presents with  . Weakness    (Consider location/radiation/quality/duration/timing/severity/associated sxs/prior treatment) The history is provided by the patient. No language interpreter was used.    Eddie [name] is a 66 y.o. male , with a hx of PEG tube placement (performed on 09/21/12) and squamous cell carcinoma of esophagus (09/25/12), who presents to the Emergency Department complaining of sudden, progressively worsening, vomiting, onset today (10/24/12)  Associated symptoms include nausea. The pt's relative reports the pt is currently undergoing chemotherapy treatment for throat cancer. The pt's relative informs that she is concerned due to the pt vomiting through his gastrostomy tube, where she has visualized traces of blood.   The pt denies fever and diarrhea.   The pt does not smoke, however, he does drink alcohol occasionally.   PCP is Dr. Jeanice Lim.    Past Medical History  Diagnosis Date  . Hypertension   . Chronic back pain   . GERD (gastroesophageal reflux disease)   . Squamous cell carcinoma Oct 2014    Esophogus  . Squamous cell carcinoma of esophagus 09/25/2012    Past Surgical History  Procedure Date  . Peg tube placement 09/21/2012  . Esophagogastroduodenoscopy 09/21/2012    Procedure: ESOPHAGOGASTRODUODENOSCOPY (EGD);  Surgeon: Malissa Hippo, MD;  Location: [facility];  Service: Endoscopy;  Laterality: N/A;  345  . Peg placement 09/21/2012    Procedure: PERCUTANEOUS ENDOSCOPIC GASTROSTOMY (PEG) PLACEMENT;  Surgeon: Malissa Hippo, MD;  Location: [facility];  Service: Endoscopy;  Laterality: N/A;    Family History  Problem Relation Age of  Onset  . Stroke Mother   . Hypertension Father   . Cancer Brother     Throat cancer  . Kidney disease Daughter     History  Substance Use Topics  . Smoking status: Former Smoker -- 1.0 packs/day for 50 years  . Smokeless tobacco: Never Used     Comment: 1/2 to 1 pack x50 yrs.  . Alcohol Use: No     Comment: twice a week      Review of Systems  All other systems reviewed and are negative.    Allergies  Motrin  Home Medications   Current Outpatient Rx  Name  Route  Sig  Dispense  Refill  . FIRST-DUKES MOUTHWASH MT SUSP   Mouth/Throat   Use as directed 5 mLs in the mouth or throat 4 (four) times daily as needed (mouth pain/thrush).   400 mL   1   . DOCUSATE SODIUM 50 MG/5ML PO LIQD   Oral   Take 50 mg by mouth as needed. For constipation         . FLUCONAZOLE 40 MG/ML PO SUSR   Oral   Take 2.5 mLs (100 mg total) by mouth daily.   35 mL   0   . JEVITY PO   PEG Tube   by PEG Tube route 4 (four) times daily. 1 8oz can 4 x daily         . ONDANSETRON HCL 8 MG PO TABS   Oral   Take 8 mg by mouth 2 (two) times daily as  needed. Put on tongue and let it dissolve.         . OXYCODONE HCL 5 MG/5ML PO SOLN   Oral   Take 5 mLs (5 mg total) by mouth every 4 (four) hours as needed for pain.   250 mL   0   . SODIUM CHLORIDE 0.9 % IJ SOLN   Per Tube   Place 75 mLs into feeding tube as needed.   1500 mL   0   . FLUOROURACIL (5FU) CHEMO IV INFUSION PUMP CALGB 16109   Intravenous   Inject into the vein. To infuse over 7 days. Change bag every 7 days @ [facility].         Marland Kitchen LORAZEPAM 0.5 MG PO TABS   Sublingual   Place 1 tablet (0.5 mg total) under the tongue every 4 (four) hours as needed (nausea/vomiting).   45 tablet   1   . PROCHLORPERAZINE MALEATE 10 MG PO TABS   Oral   Take 10 mg by mouth every 6 (six) hours as needed. Nausea/vomiting         . PROCHLORPERAZINE 25 MG RE SUPP   Rectal   Place 25 mg rectally every 6 (six) hours as needed.  (on file at [employer]. Apoth in case pt needs instead of the pill)           BP 121/90  Pulse 114  Temp 98.5 F (36.9 C) (Oral)  Resp 20  Ht 5\' 7"  (1.702 m)  Wt 105 lb (47.628 kg)  BMI 16.45 kg/m2  SpO2 100%  Physical Exam  Nursing note and vitals reviewed. Constitutional: He is oriented to person, place, and time. He appears well-developed. He appears cachectic.  Non-toxic appearance. No distress.       mal-nourished.   HENT:  Head: Normocephalic and atraumatic.       Dry mucous membranes, cracked lips.   Eyes: Conjunctivae normal, EOM and lids are normal. Pupils are equal, round, and reactive to light.  Neck: Normal range of motion. Neck supple. No tracheal deviation present. No mass present.  Cardiovascular: Normal rate, regular rhythm and normal heart sounds.  Exam reveals no gallop.   No murmur heard. Pulmonary/Chest: Effort normal and breath sounds normal. No stridor. No respiratory distress. He has no decreased breath sounds. He has no wheezes. He has no rhonchi. He has no rales.  Abdominal: Soft. Normal appearance and bowel sounds are normal. He exhibits no distension. There is no tenderness. There is no rebound and no CVA tenderness.       G-tube in place, skin break down surrounding it.   Musculoskeletal: Normal range of motion. He exhibits no edema and no tenderness.  Neurological: He is alert and oriented to person, place, and time. He has normal strength. No cranial nerve deficit or sensory deficit. GCS eye subscore is 4. GCS verbal subscore is 5. GCS motor subscore is 6.  Skin: Skin is warm and dry. No abrasion and no rash noted.  Psychiatric: He has a normal mood and affect. His speech is normal and behavior is normal.    ED Course  Procedures (including critical care time)  DIAGNOSTIC STUDIES: Oxygen Saturation is 100% on room air, normal by my interpretation.    COORDINATION OF CARE:  7:38 PM- Treatment plan concerning management of dehydration discussed with  patient. Pt agrees with treatment.    10:18 PM- Recheck. Pt still feeling weak. Treatment plan concerning hospital admission discussed with patient. Pt agrees with treatment.  Results for orders placed during the hospital encounter of 10/24/12  LACTIC ACID, PLASMA      Component Value Range   Lactic Acid, Venous 1.8  0.5 - 2.2 mmol/L  CBC WITH DIFFERENTIAL      Component Value Range   WBC 17.7 (*) 4.0 - 10.5 K/uL   RBC 4.36  4.22 - 5.81 MIL/uL   Hemoglobin 14.0  13.0 - 17.0 g/dL   HCT 16.1  09.6 - 04.5 %   MCV 95.2  78.0 - 100.0 fL   MCH 32.1  26.0 - 34.0 pg   MCHC 33.7  30.0 - 36.0 g/dL   RDW 40.9  81.1 - 91.4 %   Platelets 128 (*) 150 - 400 K/uL   Neutrophils Relative 91 (*) 43 - 77 %   Neutro Abs 16.0 (*) 1.7 - 7.7 K/uL   Lymphocytes Relative 5 (*) 12 - 46 %   Lymphs Abs 0.9  0.7 - 4.0 K/uL   Monocytes Relative 3  3 - 12 %   Monocytes Absolute 0.6  0.1 - 1.0 K/uL   Eosinophils Relative 1  0 - 5 %   Eosinophils Absolute 0.2  0.0 - 0.7 K/uL   Basophils Relative 0  0 - 1 %   Basophils Absolute 0.0  0.0 - 0.1 K/uL  COMPREHENSIVE METABOLIC PANEL      Component Value Range   Sodium 138  135 - 145 mEq/L   Potassium 4.4  3.5 - 5.1 mEq/L   Chloride 101  96 - 112 mEq/L   CO2 25  19 - 32 mEq/L   Glucose, Bld 122 (*) 70 - 99 mg/dL   BUN 51 (*) 6 - 23 mg/dL   Creatinine, Ser 7.82  0.50 - 1.35 mg/dL   Calcium 9.3  8.4 - 95.6 mg/dL   Total Protein 6.5  6.0 - 8.3 g/dL   Albumin 3.3 (*) 3.5 - 5.2 g/dL   AST 12  0 - 37 U/L   ALT 11  0 - 53 U/L   Alkaline Phosphatase 64  39 - 117 U/L   Total Bilirubin 0.5  0.3 - 1.2 mg/dL   GFR calc non Af Amer 62 (*) >90 mL/min   GFR calc Af Amer 71 (*) >90 mL/min  URINALYSIS, ROUTINE W REFLEX MICROSCOPIC      Component Value Range   Color, Urine YELLOW  YELLOW   APPearance CLEAR  CLEAR   Specific Gravity, Urine 1.025  1.005 - 1.030   pH 5.5  5.0 - 8.0   Glucose, UA NEGATIVE  NEGATIVE mg/dL   Hgb urine dipstick NEGATIVE  NEGATIVE    Bilirubin Urine NEGATIVE  NEGATIVE   Ketones, ur NEGATIVE  NEGATIVE mg/dL   Protein, ur NEGATIVE  NEGATIVE mg/dL   Urobilinogen, UA 0.2  0.0 - 1.0 mg/dL   Nitrite NEGATIVE  NEGATIVE   Leukocytes, UA NEGATIVE  NEGATIVE   Dg Chest 2 View  10/24/2012  *RADIOLOGY REPORT*  Clinical Data: Chest pain and weakness, history smoking, squamous cell carcinoma of the esophagus, hypertension  CHEST - 2 VIEW  Comparison: 10/10/2012  Findings: Right arm PICC line, tip projecting over SVC just below aortic arch. Normal heart size, mediastinal contours, and pulmonary vascularity. Lungs appear emphysematous but clear. No pleural effusion or pneumothorax. Osseous structures unremarkable.  IMPRESSION: Changes of COPD. No acute abnormalities.   Original Report Authenticated By: Ulyses Southward, M.D.       No diagnosis found.    MDM  Pt  given iv fluids, still complains of weakness--will be admitted for observation      I personally performed the services described in this documentation, which was scribed in my presence. The recorded information has been reviewed and is accurate.     Eddie Baker, MD 10/24/12 2225

## 2012-10-24 NOTE — ED Notes (Signed)
May contact Caroll Rancher - Sister in law for concerns, pt is agreeable.  Phone [phone]

## 2012-10-25 ENCOUNTER — Ambulatory Visit (HOSPITAL_COMMUNITY): Payer: Self-pay | Admitting: Oncology

## 2012-10-25 ENCOUNTER — Inpatient Hospital Stay (HOSPITAL_COMMUNITY): Payer: Self-pay

## 2012-10-25 DIAGNOSIS — C159 Malignant neoplasm of esophagus, unspecified: Secondary | ICD-10-CM

## 2012-10-25 DIAGNOSIS — E440 Moderate protein-calorie malnutrition: Secondary | ICD-10-CM

## 2012-10-25 DIAGNOSIS — B370 Candidal stomatitis: Secondary | ICD-10-CM

## 2012-10-25 DIAGNOSIS — R5381 Other malaise: Secondary | ICD-10-CM

## 2012-10-25 DIAGNOSIS — K121 Other forms of stomatitis: Secondary | ICD-10-CM

## 2012-10-25 DIAGNOSIS — T451X5A Adverse effect of antineoplastic and immunosuppressive drugs, initial encounter: Secondary | ICD-10-CM

## 2012-10-25 DIAGNOSIS — K1230 Oral mucositis (ulcerative), unspecified: Secondary | ICD-10-CM

## 2012-10-25 DIAGNOSIS — R5383 Other fatigue: Secondary | ICD-10-CM

## 2012-10-25 DIAGNOSIS — R1310 Dysphagia, unspecified: Secondary | ICD-10-CM

## 2012-10-25 DIAGNOSIS — R112 Nausea with vomiting, unspecified: Secondary | ICD-10-CM

## 2012-10-25 LAB — BASIC METABOLIC PANEL
GFR calc Af Amer: 82 mL/min — ABNORMAL LOW (ref >90)
GFR calc non Af Amer: 71 mL/min — ABNORMAL LOW (ref >90)
Glucose, Bld: 118 mg/dL — ABNORMAL HIGH (ref 70–99)
Potassium: 4.6 mEq/L (ref 3.5–5.1)
Sodium: 137 mEq/L (ref 135–145)

## 2012-10-25 LAB — CBC
Hemoglobin: 11.9 g/dL — ABNORMAL LOW (ref 13.0–17.0)
MCH: 32.4 pg (ref 26.0–34.0)
RBC: 3.67 MIL/uL — ABNORMAL LOW (ref 4.22–5.81)

## 2012-10-25 MED ORDER — POLYETHYLENE GLYCOL 3350 17 G PO PACK: 17.0000 g | PACK | Freq: Every day | ORAL | Status: DC | PRN

## 2012-10-25 MED ORDER — THIAMINE HCL 100 MG/ML IJ SOLN
INTRAVENOUS | Status: DC
  Administered 2012-10-25 – 2012-10-26 (×3): via INTRAVENOUS
  Filled 2012-10-25 (×5): qty 1000

## 2012-10-25 MED ORDER — SODIUM CHLORIDE 0.9 % IJ SOLN
20.0000 mL | INTRAMUSCULAR | Status: DC | PRN
  Administered 2012-10-25: 20 mL via INTRAVENOUS
  Administered 2012-10-30: 3 mL via INTRAVENOUS
  Administered 2012-11-02: 20 mL via INTRAVENOUS

## 2012-10-25 MED ORDER — MAGIC MOUTHWASH W/LIDOCAINE
5.0000 mL | Freq: Four times a day (QID) | ORAL | Status: DC
  Filled 2012-10-25 (×6): qty 5

## 2012-10-25 MED ORDER — PNEUMOCOCCAL VAC POLYVALENT 25 MCG/0.5ML IJ INJ
0.5000 mL | INJECTION | INTRAMUSCULAR | Status: AC
  Administered 2012-10-26: 0.5 mL via INTRAMUSCULAR
  Filled 2012-10-25: qty 0.5

## 2012-10-25 MED ORDER — HEPARIN SOD (PORK) LOCK FLUSH 100 UNIT/ML IV SOLN
INTRAVENOUS | Status: AC
  Filled 2012-10-25: qty 5

## 2012-10-25 MED ORDER — FLUCONAZOLE IN SODIUM CHLORIDE 200-0.9 MG/100ML-% IV SOLN
INTRAVENOUS | Status: AC
  Filled 2012-10-25: qty 100

## 2012-10-25 MED ORDER — PANTOPRAZOLE SODIUM 40 MG IV SOLR
40.0000 mg | Freq: Two times a day (BID) | INTRAVENOUS | Status: DC
  Administered 2012-10-25 – 2012-10-26 (×5): 40 mg via INTRAVENOUS
  Filled 2012-10-25 (×6): qty 40

## 2012-10-25 MED ORDER — LORAZEPAM 2 MG/ML IJ SOLN
0.2500 mg | Freq: Three times a day (TID) | INTRAMUSCULAR | Status: DC
  Administered 2012-10-25 – 2012-11-01 (×17): 0.25 mg via INTRAVENOUS
  Filled 2012-10-25 (×16): qty 1

## 2012-10-25 MED ORDER — HYDROMORPHONE HCL PF 1 MG/ML IJ SOLN: 0.5000 mg | INTRAMUSCULAR | Status: DC | PRN

## 2012-10-25 MED ORDER — ACETAMINOPHEN 325 MG PO TABS
650.0000 mg | ORAL_TABLET | Freq: Four times a day (QID) | ORAL | Status: DC | PRN
  Administered 2012-10-26 – 2012-10-28 (×4): 650 mg via ORAL
  Filled 2012-10-25 (×4): qty 2

## 2012-10-25 MED ORDER — FOLIC ACID 5 MG/ML IJ SOLN
INTRAMUSCULAR | Status: AC
  Filled 2012-10-25: qty 0.2

## 2012-10-25 MED ORDER — FLUCONAZOLE 100MG IVPB
100.0000 mg | INTRAVENOUS | Status: DC
  Administered 2012-10-26 – 2012-11-06 (×12): 100 mg via INTRAVENOUS
  Filled 2012-10-25 (×13): qty 50

## 2012-10-25 MED ORDER — THIAMINE HCL 100 MG/ML IJ SOLN
INTRAMUSCULAR | Status: AC
  Filled 2012-10-25: qty 2

## 2012-10-25 MED ORDER — PROMETHAZINE HCL 12.5 MG PO TABS: 12.5000 mg | ORAL_TABLET | Freq: Four times a day (QID) | ORAL | Status: DC | PRN

## 2012-10-25 MED ORDER — HYDROMORPHONE HCL PF 1 MG/ML IJ SOLN
0.5000 mg | INTRAMUSCULAR | Status: DC | PRN
  Administered 2012-10-25 – 2012-10-28 (×8): 0.5 mg via INTRAVENOUS
  Filled 2012-10-25 (×8): qty 1

## 2012-10-25 MED ORDER — MAGIC MOUTHWASH
5.0000 mL | Freq: Three times a day (TID) | ORAL | Status: DC
  Administered 2012-10-25 – 2012-11-07 (×50): 5 mL via ORAL
  Filled 2012-10-25 (×46): qty 5

## 2012-10-25 MED ORDER — ENOXAPARIN SODIUM 30 MG/0.3ML ~~LOC~~ SOLN
30.0000 mg | SUBCUTANEOUS | Status: DC
  Administered 2012-10-25 – 2012-10-27 (×3): 30 mg via SUBCUTANEOUS
  Filled 2012-10-25 (×3): qty 0.3

## 2012-10-25 MED ORDER — ALBUTEROL SULFATE (5 MG/ML) 0.5% IN NEBU
2.5000 mg | INHALATION_SOLUTION | Freq: Four times a day (QID) | RESPIRATORY_TRACT | Status: DC
  Administered 2012-10-25 – 2012-10-26 (×6): 2.5 mg via RESPIRATORY_TRACT
  Filled 2012-10-25 (×7): qty 0.5

## 2012-10-25 MED ORDER — FLUCONAZOLE IN SODIUM CHLORIDE 200-0.9 MG/100ML-% IV SOLN
200.0000 mg | Freq: Once | INTRAVENOUS | Status: AC
  Administered 2012-10-25: 200 mg via INTRAVENOUS
  Filled 2012-10-25: qty 100

## 2012-10-25 MED ORDER — M.V.I. ADULT IV INJ
INJECTION | INTRAVENOUS | Status: AC
  Filled 2012-10-25: qty 10

## 2012-10-25 MED ORDER — ALBUTEROL SULFATE (5 MG/ML) 0.5% IN NEBU: 2.5000 mg | INHALATION_SOLUTION | RESPIRATORY_TRACT | Status: DC | PRN

## 2012-10-25 MED ORDER — ONDANSETRON HCL 4 MG/2ML IJ SOLN
4.0000 mg | Freq: Three times a day (TID) | INTRAMUSCULAR | Status: DC
  Administered 2012-10-25: 4 mg via INTRAVENOUS
  Filled 2012-10-25: qty 2

## 2012-10-25 MED ORDER — SODIUM CHLORIDE 0.9 % IJ SOLN
INTRAMUSCULAR | Status: AC
  Filled 2012-10-25: qty 20

## 2012-10-25 MED ORDER — LIDOCAINE VISCOUS 2 % MT SOLN
5.0000 mL | Freq: Three times a day (TID) | OROMUCOSAL | Status: DC
  Administered 2012-10-25 – 2012-11-07 (×49): 5 mL via OROMUCOSAL
  Filled 2012-10-25 (×44): qty 15

## 2012-10-25 MED ORDER — ACETAMINOPHEN 650 MG RE SUPP
650.0000 mg | Freq: Four times a day (QID) | RECTAL | Status: DC | PRN
  Administered 2012-10-25 – 2012-10-28 (×3): 650 mg via RECTAL
  Filled 2012-10-25 (×3): qty 1

## 2012-10-25 MED ORDER — ONDANSETRON 8 MG/NS 50 ML IVPB
8.0000 mg | Freq: Three times a day (TID) | INTRAVENOUS | Status: DC
  Administered 2012-10-25 – 2012-11-01 (×18): 8 mg via INTRAVENOUS
  Filled 2012-10-25 (×25): qty 8

## 2012-10-25 MED ORDER — LORAZEPAM 2 MG/ML IJ SOLN
0.5000 mg | INTRAMUSCULAR | Status: DC | PRN
  Administered 2012-10-27: 0.5 mg via INTRAVENOUS
  Filled 2012-10-25: qty 1

## 2012-10-25 NOTE — Consult Note (Signed)
Reason for consultation ; Nausea vomiting diarrhea and abdominal pain the patient who is undergoing chemoradiation therapy for esophageal squamous cell carcinoma. Patient is also being treated for stomatitis and oral candidiasis. History of present illness; Patient is 66 year old African American male who was evaluated last month for rapidly progressive dysphagia and weight loss. He underwent esophagogastroduodenoscopy on 09/13/2012 and was found to have esophageal mass with critical narrowing in proximal esophagus. He also had duodenitis. Biopsy confirmed squamous cell carcinoma of the esophagus and H. pylori serology was positive. Treatment was delayed because of other ongoing issues. He was subsequently evaluated by Jenita Seashore, PA/Dr. Mariel Sleet. He was also evaluated by Dr. Ledell Peoples of surgical service and felt not to be a candidate for surgery. He underwent PEG placement on 09/21/2012. On this exam he was found to have duodenal ulcer new since EGD of 09/13/2012. He was begun on bolus feedings which she's been tolerating until recently. Radiation and chemotherapy begun on 10/11/2012 and he has received 3 cycles of 5-FU and he has had 7 sessions of radiation therapy.  He developed oral pain. He was felt to have mucositis and oral candidiasis and started on Dukes Magic mouthwash and Diflucan 2 days ago. He presented emergency room yesterday with worsening oral pain nausea vomiting and diarrhea. Nausea vomiting and diarrhea started 3 days ago. According to his family members he was vomiting Jevity that he was receiving via PEG. He also has been experiencing lower abdominal pain. Even though he has been vomiting he has not been able to handle his secretions and having to spaces saliva often. He has not had any emesis today. He's had 3 loose stools today. He was experiencing cramping across his lower abdomen but none today. He denies shortness of breath. He feels very weak. In the last 2 months he has lost  another 11 pounds. All in all he has lost over 30-40 pounds. Current medications; Current Facility-Administered Medications  Medication Dose Route Frequency Provider Last Rate Last Dose  . 0.9 %  sodium chloride infusion   Intravenous Continuous Erick Blinks, MD      . acetaminophen (TYLENOL) tablet 650 mg  650 mg Oral Q6H PRN Edsel Petrin, DO       Or  . acetaminophen (TYLENOL) suppository 650 mg  650 mg Rectal Q6H PRN Edsel Petrin, DO   650 mg at 10/25/12 1541  . albuterol (PROVENTIL) (5 MG/ML) 0.5% nebulizer solution 2.5 mg  2.5 mg Nebulization Q6H Edsel Petrin, DO   2.5 mg at 10/25/12 2019  . albuterol (PROVENTIL) (5 MG/ML) 0.5% nebulizer solution 2.5 mg  2.5 mg Nebulization Q2H PRN Edsel Petrin, DO      . enoxaparin (LOVENOX) injection 30 mg  30 mg Subcutaneous Q24H Edsel Petrin, DO   30 mg at 10/25/12 1038  . fluconazole (DIFLUCAN) IVPB 100 mg  100 mg Intravenous Q24H Edsel Petrin, DO      . [COMPLETED] fluconazole (DIFLUCAN) IVPB 200 mg  200 mg Intravenous Once Edsel Petrin, DO   200 mg at 10/25/12 0329  . [COMPLETED] HYDROmorphone (DILAUDID) injection 0.25 mg  0.25 mg Intravenous Once Edsel Petrin, DO   0.25 mg at 10/24/12 2325  . HYDROmorphone (DILAUDID) injection 0.5 mg  0.5 mg Intravenous Q2H PRN Ellouise Newer, PA      . magic mouthwash  5 mL Oral TID PC & HS Erick Blinks, MD   5 mL at 10/25/12 1720   And  . lidocaine (  XYLOCAINE) 2 % viscous mouth solution 5 mL  5 mL Mouth/Throat TID PC & HS Erick Blinks, MD   5 mL at 10/25/12 1720  . LORazepam (ATIVAN) injection 0.25 mg  0.25 mg Intravenous Q8H Edsel Petrin, DO   0.25 mg at 10/25/12 0631  . LORazepam (ATIVAN) injection 0.5 mg  0.5 mg Intravenous Q4H PRN Ellouise Newer, PA      . ondansetron (ZOFRAN) 8 mg/NS 50 ml IVPB  8 mg Intravenous Q8H Ellouise Newer, PA      . [COMPLETED] ondansetron [facility]) injection 4 mg  4 mg Intravenous Once Edsel Petrin, DO    4 mg at 10/24/12 2325  . pantoprazole (PROTONIX) injection 40 mg  40 mg Intravenous Q12H Edsel Petrin, DO   40 mg at 10/25/12 1037  . pneumococcal 23 valent vaccine (PNU-IMMUNE) injection 0.5 mL  0.5 mL Intramuscular Tomorrow-1000 Edsel Petrin, DO      . polyethylene glycol (MIRALAX / GLYCOLAX) packet 17 g  17 g Oral Daily PRN Edsel Petrin, DO      . sodium chloride 0.9 % 1,000 mL with thiamine 100 mg, folic acid 1 mg, multivitamins adult 10 mL infusion   Intravenous Q24H Edsel Petrin, DO 100 mL/hr at 10/25/12 0328    . [COMPLETED] sodium chloride 0.9 % bolus 1,000 mL  1,000 mL Intravenous Once Toy Baker, MD   1,000 mL at 10/24/12 1957  . [COMPLETED] sodium chloride 0.9 % bolus 500 mL  500 mL Intravenous Once Toy Baker, MD   500 mL at 10/24/12 2103  . sodium chloride 0.9 % injection 20 mL  20 mL Intravenous PRN Ellouise Newer, PA   20 mL at 10/25/12 0848  . [DISCONTINUED] HYDROmorphone (DILAUDID) injection 0.5 mg  0.5 mg Intravenous Q3H PRN Edsel Petrin, DO      . [DISCONTINUED] magic mouthwash w/lidocaine  5 mL Oral QID Edsel Petrin, DO      . [DISCONTINUED] ondansetron [facility]) injection 4 mg  4 mg Intravenous Q8H Edsel Petrin, DO   4 mg at 10/25/12 0631  . [DISCONTINUED] promethazine (PHENERGAN) tablet 12.5 mg  12.5 mg Per Tube Q6H PRN Edsel Petrin, DO       Past medical history; Hypertension. Chronic low back pain. Squamous cell carcinoma of the esophagus diagnosed last month as above. H. pylori gastritis not treated yet. Duodenal ulcer diagnosed on 09/21/2012 Cigarette smoking. History of excessive alcohol intake but none in the last 6 weeks.  Social history and family history is well documented in the chart and will not be reiterated.  Objective; BP 119/78  Pulse 72  Temp 101.6 F (38.7 C) (Oral)  Resp 20  Ht 5\' 7"  (1.702 m)  Wt 105 lb (47.628 kg)  BMI 16.45 kg/m2  SpO2 98% Well-developed thin [place]  American male who appears to be chronically ill. He is alert and responds appropriately to questions by gesturing. He has whitish plaques over soft palate. No neck masses or thyromegaly noted. Cardiac exam with regular rhythm normal S1 and S2. No murmur or gallop noted. Lungs are clear to auscultation. Abdomen is flat. G-tube in place. Bowel sounds are normal. Abdomen is soft and nontender without organomegaly or masses. Bolster was pulled back by 5 mm. Extremities are thin. Lab data; From 10/24/2012. WBC 17.7, H&H 14 and 41.5, platelet count 128K. Differential pertinent for 91% neutrophils. Serum sodium 138, potassium 4.4, chloride 101, CO2 25, glucose 122, BUN  51, creatinine 1.21, calcium 9.3. Bilirubin oh 0.5, AP 64, AST 12, ALT 11, total protein 6.5 and albumin 3.3. Lab data from this morning; BUN 40 and creatinine 1.08 Chest film from 10/24/2012 negative for pneumonia.   Assessment; #1.Acute symptoms most likely secondary to toxic effects of radiation and chemotherapy. He also has stomatitis and oral candidiasis. He may also have esophageal candidiasis and  current therapy should be effective for both. Since he has diarrhea need to rule out enteric infection  #2. Dehydration secondary to nausea vomiting and diarrhea being treated with IV fluids. #3. PEG is in correct position and there is no evidence of infection at entry site. There is no gastric residual on aspiration. #4. Duodenal ulcer and H. pylori infection. #5. Squamous cell carcinoma of the esophagus; radiation and chemotherapy on hold until acute symptoms have subsided. Patient does not have evidence of metastatic disease. Therefore he has good prognosis unless he succumbs to complications of treatment.  Recommendations; Will request stool studies for O&P cultures and C. difficile by PCR. Oropharyngeal suction set up at bedside the patient can use himself. Will resume G-feeding in am at a low rate and continuos  Mode. Will  delay treatment of H. pylori gastritis for now continue double dose PPI. Patient will be reevaluated in a.m. Stool studies for C.Diff , O&P and cultures.

## 2012-10-25 NOTE — Care Management Note (Signed)
    Page 1 of 1   10/30/2012     11:54:19 AM   CARE MANAGEMENT NOTE 10/30/2012  Patient:  Eddie [name], DRUDGE   Account Number:  [id number]  Date Initiated:  10/25/2012  Documentation initiated by:  Rosemary Holms  Subjective/Objective Assessment:   Pt admitted from home where he lives with his wife. His neice is at the bedside and states she has recently been staying with both pt and spouse. State they currently have [facility] RN HH. Do not believe they need any DME at DC.     Action/Plan:   Anticipated DC Date:  10/31/2012   Anticipated DC Plan:  SKILLED NURSING FACILITY  In-house referral  Clinical Social Worker      DC Planning Services  CM consult      Choice offered to / List presented to:             Status of service:  Completed, signed off Medicare Important Message given?  YES (If response is "NO", the following Medicare IM given date fields will be blank) Date Medicare IM given:  10/30/2012 Date Additional Medicare IM given:    Discharge Disposition:  SKILLED NURSING FACILITY  Per UR Regulation:    If discussed at Long Length of Stay Meetings, dates discussed:    Comments:  11//26/13 1140 Verne Lanuza RN BSN CM Plan continues to be to DC to SNF.  10/25/12 1530 Mackinley Cassaday Leanord Hawking RN BSN CM

## 2012-10-25 NOTE — H&P (Addendum)
[facility] History and Physical  ELAI FARRER ZOX:[id number] DOB: [birth year] DOA: 10/24/2012  Referring physician: Leonel Ramsay PCP: Milinda Antis, MD  Specialists: [facility]  Chief Complaint: Intractable Nausea and Vomitting  HPI: Eddie [name] is a 66 y.o. 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 He was deemed not to be a good surgical candidate and was started on chemoradiation on 11/7 by Dr. Mariel Sleet. He has a continuous infusion of 5-FU connected and running through a PICC in his right arm currently and patient states he also had radiation done last week. Since initiation of chemo and radiation he has gotten progressively weaker. He has developed severe mouth pain, worsening dysphagia, and stomatitis/mucosititis. He has a PEG placed for nutrition but has not been able to keep down any of his supplemental feeding because of nausea and vomiting for the past 3 days. He can barely speak because of severe mouth pain and mucosal desquamation. He is severely cachectic and there is no family at the bedside. Admission requested for evaluation of potential chemo toxicity, dehydration and pain/nausea/vomiting control.     Review of Systems:Review of Systems  Constitutional: Positive for weight loss and malaise/fatigue. Negative for fever and chills.  HENT: Positive for sore throat. Negative for congestion.   Eyes: Negative for blurred vision.  Respiratory: Negative for cough, hemoptysis, sputum production and shortness of breath.   Cardiovascular: Positive for chest pain. Negative for palpitations.  Gastrointestinal: Positive for nausea, vomiting, abdominal pain and constipation.  Genitourinary: Positive for urgency.  Musculoskeletal: Positive for myalgias.  Skin: Negative for itching and rash.  Neurological: Positive for dizziness, weakness  and headaches.  Psychiatric/Behavioral: Positive for depression and substance abuse. The patient is nervous/anxious.   All other systems reviewed and are negative.   Past Medical History  Diagnosis Date  . Hypertension   . Chronic back pain   . GERD (gastroesophageal reflux disease)   . Squamous cell carcinoma Oct 2014    Esophogus  . Squamous cell carcinoma of esophagus 09/25/2012   Past Surgical History  Procedure Date  . Peg tube placement 09/21/2012  . Esophagogastroduodenoscopy 09/21/2012    Procedure: ESOPHAGOGASTRODUODENOSCOPY (EGD);  Surgeon: Malissa Hippo, MD;  Location: [facility];  Service: Endoscopy;  Laterality: N/A;  345  . Peg placement 09/21/2012    Procedure: PERCUTANEOUS ENDOSCOPIC GASTROSTOMY (PEG) PLACEMENT;  Surgeon: Malissa Hippo, MD;  Location: [facility];  Service: Endoscopy;  Laterality: N/A;   Social History: Long history of heavy ETOH and Tobacco Abuse. Quit drinking and smoking at time of cancer diagnosis 09/2012. Lives with his wife at home, but is her primary care taker since she is debilitated from multiple strokes.The patient has 4 children. He has one son who is 23 years old and he is healthy. He has a daughter in her 30s and she is healthy. He also has another son who is in his 30s and he is on hemodialysis. His youngest daughter is in her early 43s and she is healthy. The patient's father is alive at the age of 16 he lives at home and suffers with hypertension. His mother passed away in her early 72s from a stroke. The patient has 6 brothers and 4 sisters. One brother is deceased from a stroke.    Allergies  Allergen Reactions  . Motrin (Ibuprofen) Swelling    Family History  Problem  Relation Age of Onset  . Stroke Mother   . Hypertension Father   . Cancer Brother     Throat cancer  . Kidney disease Daughter     Prior to Admission medications   Medication Sig Start Date End Date Taking? Authorizing Provider    Diphenhyd-Hydrocort-Nystatin (FIRST-DUKES MOUTHWASH) SUSP Use as directed 5 mLs in the mouth or throat 4 (four) times daily as needed (mouth pain/thrush). 10/23/12  Yes Ellouise Newer, PA  docusate (COLACE) 50 MG/5ML liquid Take 50 mg by mouth as needed. For constipation   Yes Historical Provider, MD  fluconazole (DIFLUCAN) 40 MG/ML suspension Take 2.5 mLs (100 mg total) by mouth daily. 10/23/12  Yes Ellouise Newer, PA  Nutritional Supplements (JEVITY PO) by PEG Tube route 4 (four) times daily. 1 8oz can 4 x daily   Yes Historical Provider, MD  ondansetron (ZOFRAN) 8 MG tablet Take 8 mg by mouth 2 (two) times daily as needed. Put on tongue and let it dissolve. 10/10/12  Yes Randall An, MD  oxyCODONE (ROXICODONE) 5 MG/5ML solution Take 5 mLs (5 mg total) by mouth every 4 (four) hours as needed for pain. 09/21/12  Yes Malissa Hippo, MD  sodium chloride 0.9 % injection Place 75 mLs into feeding tube as needed. 09/21/12  Yes Malissa Hippo, MD  sodium chloride 0.9 % SOLN 150 mL with fluorouracil CALGB 57846 5 GM/100ML SOLN Inject into the vein. To infuse over 7 days. Change bag every 7 days @ [facility].   Yes Historical Provider, MD  LORazepam (ATIVAN) 0.5 MG tablet Place 1 tablet (0.5 mg total) under the tongue every 4 (four) hours as needed (nausea/vomiting). 10/23/12   Ellouise Newer, PA  prochlorperazine (COMPAZINE) 10 MG tablet Take 10 mg by mouth every 6 (six) hours as needed. Nausea/vomiting    Historical Provider, MD  prochlorperazine (COMPAZINE) 25 MG suppository Place 25 mg rectally every 6 (six) hours as needed. (on file at [facility]. [facility]oth in case pt needs instead of the pill) 10/10/12   Randall An, MD   Physical Exam: Filed Vitals:   10/24/12 2150 10/24/12 2200 10/24/12 2300 10/25/12 0141  BP: 147/98 127/81 125/82 156/90  Pulse: 74 72 71   Temp:    98.9 F (37.2 C)  TempSrc:    Oral  Resp: 16   20  Height:      Weight:      SpO2: 100% 99% 100% 100%     General:   Chronically ill appearing, cachetic, has pump bag and IV infusion going in PICC in right AC, appears very weak, flat affect, profound difficulty communicting  Eyes: muddy sclera, pupils sluggish  ENT: Lips are swollen, red, desquamated, thick gray/white exudate on buccal surfaces, diffuse mucosal irritation   Neck: scattered small adenopathy  Cardiovascular: tachycardic, regular  Respiratory: CTAB  Abdomen: PEG tube in place, no drainage, mild epigastric tenderness  Skin: no plamar or plantar rashes or skin lesions  Musculoskeletal: extremity muscle atrophy, generalized, PICC in right arm AC, site looks normal  Psychiatric: flat affect, limited communication  Neurologic: non-focal  Labs on Admission:  Basic Metabolic Panel:  Lab 10/24/12 9629  NA 138  K 4.4  CL 101  CO2 25  GLUCOSE 122*  BUN 51*  CREATININE 1.21  CALCIUM 9.3  MG --  PHOS --   Liver Function Tests:  Lab 10/24/12 1945  AST 12  ALT 11  ALKPHOS 64  BILITOT 0.5  PROT 6.5  ALBUMIN 3.3*   CBC:  Lab 10/24/12 1945 10/18/12 0841  WBC 17.7* 11.7*  NEUTROABS 16.0* 9.6*  HGB 14.0 12.5*  HCT 41.5 36.8*  MCV 95.2 95.6  PLT 128* 247    Radiological Exams on Admission: Dg Chest 2 View  10/24/2012  *RADIOLOGY REPORT*  Clinical Data: Chest pain and weakness, history smoking, squamous cell carcinoma of the esophagus, hypertension  CHEST - 2 VIEW  Comparison: 10/10/2012  Findings: Right arm PICC line, tip projecting over SVC just below aortic arch. Normal heart size, mediastinal contours, and pulmonary vascularity. Lungs appear emphysematous but clear. No pleural effusion or pneumothorax. Osseous structures unremarkable.  IMPRESSION: Changes of COPD. No acute abnormalities.   Original Report Authenticated By: Ulyses Southward, M.D.     EKG: Left Atrial Enlargement, QT normal, NSR  Assessment/Plan Active Problems:  Squamous cell carcinoma of esophagus  Thrush  Stomatitis and mucositis  Throat pain   Leukocytosis (leucocytosis)  Dehydration  Cachexia   This is an unfortunate 66 yo gentleman who has newly diagnosed esophageal cancer. He was already very weak and cachectic at the time of diagnosis from dysphagia and localized esophageal stricture from mass effect. His PET scan did not show distant mets, but positive paratracheal nodes. He was recently started on continuous infusion 5-FU with radiation and has developed severe mucositis/stomatitis and intractable nausea and vomiting despite pre-medication efforts with Zofran and Dexamethasone. He is dehydrated, having nausea and vomiting, weakness, and is in significant pain from his mouth and appears to have thrush with probable candidal esophagitis.   Fluconazole IV for thrush and espohagitis  MMW with Lido QID for Mucositis  Consult placed in epic with nursing note to call Onc/Chemo center in AM-I have high concerns about 5-FU toxicity with continuous infusion in the setting of stomatitis, lip swelling, NV, weakness. May need to stop 5-FU and needs ASAP evaluation by oncology.  IV fluids w/MVI at 100  Nutrition Consult  Scheduled Zofran, dexamethasone held by onc office, but may need to restart for symptom control.   Ativan scheduled for nausea.  No G-tube feedings for now, clears PO for comfort only  EKG with LAE, may need further cardiac eval - no obvious cardiac toxicity currently  No fevers or signs of infection on CXR or urine but he has leukocytosis in setting of chemo, may need prophylactic antibiotics.  IV dilaudid given for pain, may need to schedule pain meds, patient probably will not ask for them.  Code Status:  I was unable to have a full conversation with this patient about his code status, he was in too much pain to talk and his affect was depressed. This needs to be fully addressed when family is available, he is currently a Full Code which is quite concerning given his terminal illness and severe cachexia. Hopeful  that a goals of care discussion can occur during this hospitalization.  Family Communication: No family available at bedside, plan of care discussed with patient. Disposition Plan: 2-4 days Time spent: 70 minutes  [facility] Pager [phone]  If 7PM-7AM, please contact night-coverage [email or web address] Password [email or web address] 10/25/2012, 1:52 AM

## 2012-10-25 NOTE — Progress Notes (Signed)
UR Chart Review Completed  

## 2012-10-25 NOTE — Progress Notes (Signed)
INITIAL ADULT NUTRITION ASSESSMENT Date: 10/25/2012   Time: 2:52 PM Reason for Assessment: TF Consult  ASSESSMENT: Male 66 y.o.  INTERVENTION:  If cleared by GI and TF is to be resumed: recommend continuous Jev. 1.2 via PEG start @ 20 ml/hr and advance by 10 ml q 6h to goal rate of 60 ml/hr. If no IVF's recommend flushes of 200 ml q 8 hrs.  EN will provide: 1728 kcal, 79 gr protein, 1129 ml water from formula; which will meet 100% of current estimated nutritional needs.   Dx: Nausea and vomiting   Past Medical History  Diagnosis Date  . Hypertension   . Chronic back pain   . GERD (gastroesophageal reflux disease)   . Squamous cell carcinoma Oct 2014    Esophogus  . Squamous cell carcinoma of esophagus 09/25/2012    Scheduled Meds:   . albuterol  2.5 mg Nebulization Q6H  . enoxaparin (LOVENOX) injection  30 mg Subcutaneous Q24H  . fluconazole (DIFLUCAN) IV  100 mg Intravenous Q24H  . [COMPLETED] fluconazole (DIFLUCAN) IV  200 mg Intravenous Once  . [COMPLETED] HYDROmorphone  0.25 mg Intravenous Once  . magic mouthwash  5 mL Oral TID PC & HS   And  . lidocaine  5 mL Mouth/Throat TID PC & HS  . LORazepam  0.25 mg Intravenous Q8H  . [COMPLETED] LORazepam  0.5 mg Intravenous Once  . ondansetron (ZOFRAN) IV  8 mg Intravenous Q8H  . [COMPLETED] ondansetron  4 mg Intravenous Once  . [COMPLETED] ondansetron  4 mg Intravenous Once  . pantoprazole (PROTONIX) IV  40 mg Intravenous Q12H  . pneumococcal 23 valent vaccine  0.5 mL Intramuscular Tomorrow-1000  . general admission iv infusion   Intravenous Q24H  . [COMPLETED] sodium chloride  1,000 mL Intravenous Once  . [COMPLETED] sodium chloride  500 mL Intravenous Once  . [DISCONTINUED] magic mouthwash w/lidocaine  5 mL Oral QID  . [DISCONTINUED] ondansetron (ZOFRAN) IV  4 mg Intravenous Q8H   Continuous Infusions:   . sodium chloride Stopped (10/25/12 0333)   PRN Meds:.acetaminophen, acetaminophen, albuterol, HYDROmorphone  (DILAUDID) injection, LORazepam, polyethylene glycol, sodium chloride, [DISCONTINUED]  HYDROmorphone (DILAUDID) injection, [DISCONTINUED] promethazine  Ht: 5\' 7"  (170.2 cm)  Wt: 105 lb (47.628 kg)  Ideal Wt:  66.1 kg % Ideal Wt: 72%  Usual Wt:  Wt Readings from Last 10 Encounters:  10/24/12 105 lb (47.628 kg)  10/18/12 104 lb (47.174 kg)  10/11/12 108 lb 3.2 oz (49.079 kg)  10/09/12 105 lb 11.2 oz (47.945 kg)  10/01/12 111 lb (50.349 kg)  09/25/12 111 lb 14.4 oz (50.758 kg)  09/13/12 112 lb (50.803 kg)  09/13/12 112 lb (50.803 kg)  09/07/12 112 lb 1.9 oz (50.857 kg)  08/22/12 116 lb 4.8 oz (52.753 kg)     Body mass index is 16.45 kg/(m^2). Underweight  Food/Nutrition Related Hx: Pt s/p PEG tube placement on 09/21/12. He has been receiving bolus feedings of 4 cans (237 ml) Jevity 1.2 QID with flushes of 120 ml water before and after each feeding. Home regimen provided 1137 kcal, 52 gr protein daily which was meeting 78% energy, 72% protein, 764 ml water from formula and 960 ml water via flushes for total water 1724 ml water daily. Pt caregiver reports that pt started vomiting Monday or Tuesday this week. Mr. Rackley has hx of 30# wt loss prior to PEG placement.Marland Kitchen His wt was 112# on 10/18 assessment. Current wt reflects additional non volitional wt loss of 7#, 6% in 30  day which is significant. He has not been taking anything by mouth at home. TF is on hold at this time pending GI evaluation and recommendations.  Pt meets criteria for Severe MALNUTRITION in the context of chronic illness as evidenced by wt loss of >5% in 30 days and severe subcutaneous fat and muscle loss.    CMP     Component Value Date/Time   NA 137 10/25/2012 0519   K 4.6 10/25/2012 0519   CL 108 10/25/2012 0519   CO2 23 10/25/2012 0519   GLUCOSE 118* 10/25/2012 0519   BUN 40* 10/25/2012 0519   CREATININE 1.08 10/25/2012 0519   CREATININE 1.12 07/30/2012 0950   CALCIUM 8.4 10/25/2012 0519   PROT 6.5 10/24/2012  1945   ALBUMIN 3.3* 10/24/2012 1945   AST 12 10/24/2012 1945   ALT 11 10/24/2012 1945   ALKPHOS 64 10/24/2012 1945   BILITOT 0.5 10/24/2012 1945   GFRNONAA 71* 10/25/2012 0519   GFRAA 82* 10/25/2012 0519    Intake/Output Summary (Last 24 hours) at 10/25/12 1507 Last data filed at 10/25/12 1230  Gross per 24 hour  Intake      0 ml  Output      0 ml  Net      0 ml     Diet Order: Clear Liquid  Supplements/Tube Feeding:none at this time  IVF:    sodium chloride Last Rate: Stopped (10/25/12 0333)    Estimated Nutritional Needs:   Kcal:1440-1680 kcal Protein:72-86 gr Fluid:1 ml/kcal  NUTRITION DIAGNOSIS: -Inadequate oral intake (NI-2.1).  Status: Ongoing  RELATED TO: esophageal carcinoma  AS EVIDENCE BY: Non-volitional wt loss hx, NPO /Clear Liquids status, increased nutrition needs with catabolic illness  MONITORING/EVALUATION(Goals): Monitor for nutrition support plan  EDUCATION NEEDS: -No education needs identified at this time  Dietitian [phone]  DOCUMENTATION CODES Per approved criteria  -Severe malnutrition in the context of chronic illness    Eddie [name] 10/25/2012, 2:52 PM

## 2012-10-25 NOTE — Consult Note (Signed)
[facility] Site Consultation Oncology  Name: Eddie [name]      MRN: [id number]    Location: A315/A315-01  Date: 10/25/2012 Time:9:02 AM   REFERRING PHYSICIAN:  Erick Blinks, MD  REASON FOR CONSULT:  Nausea, vomiting, stomatitis, oral/esophageal candidiasis, assist in care.   DIAGNOSIS:  Squamous cell carcinoma of esophagus  HISTORY OF PRESENT ILLNESS:   Eddie [name] is a pleasant 66 year old african american male who is well-known to the [facility] cancer Center for his squamous cell carcinoma of esophagus. Complete staging did not reveal any distant metastatic disease, the PET scan demonstrated the primary mass with mildly hypermetabolic activity appreciated with high paratracheal lymph nodes. The patient was seen by Dr. Karen Kays for consideration of surgery and the patient was not a good surgical candidate. The patient was seen by Dr. Kristin Bruins and was cleared for radiation from a dental standpoint.  The patient began concomitant chemoradiation with 5-FU continuous infusion at 400mg /m2/day with weekly, changes.  He started chemotherapy on 10/11/2012. He has received 2 cycles thus far. Her last pump chain was on 10/18/2012. He was due for him to be changed today and therefore there's not much of the chemotherapy left in the bag that is infusing via the infusional pump.  The patient was seen as a walk-in in the clinic 2 days ago. At that time, he was started on Dukes Magic mouthwash in conjunction with Diflucan (to be administered through the PEG tube). [name] reports this morning that since Tuesday, his mouth pain has worsened.  Part of the patient's premedication for chemotherapy consisted of dexamethasone. This was discontinued on Tuesday as well.  [name] also admits to abdominal pain. I suspect that is secondary to retching since he does admit to nausea and vomiting.  The patient's main complaint today is his stomatitis and abdominal discomfort. I will review the patient's pain  medications.  Infusional, discontinued this morning between the hours of 8:30 and 8:45 AM. I contacted the [facility] cancer Center to inform them that [name] is admitted to the hospital will not be arriving for radiation. I will consult GI to see if they have any suggestions for the patient's stomatitis/oral candidiasis. It should be assumed that the patient has esophageal candidiasis do to his extensive oropharynx candidiasis. The patient is now on IV Diflucan. He has 0.5 mg of IV Dilaudid order every 3 hours.  PAST MEDICAL HISTORY:   Past Medical History  Diagnosis Date  . Hypertension   . Chronic back pain   . GERD (gastroesophageal reflux disease)   . Squamous cell carcinoma Oct 2014    Esophogus  . Squamous cell carcinoma of esophagus 09/25/2012    ALLERGIES: Allergies  Allergen Reactions  . Motrin (Ibuprofen) Swelling      MEDICATIONS: I have reviewed the patient's current medications.     PAST SURGICAL HISTORY Past Surgical History  Procedure Date  . Peg tube placement 09/21/2012  . Esophagogastroduodenoscopy 09/21/2012    Procedure: ESOPHAGOGASTRODUODENOSCOPY (EGD);  Surgeon: Malissa Hippo, MD;  [facility]: [facility];  Service: Endoscopy;  Laterality: N/A;  345  . Peg placement 09/21/2012    Procedure: PERCUTANEOUS ENDOSCOPIC GASTROSTOMY (PEG) PLACEMENT;  Surgeon: Malissa Hippo, MD;  Location: [facility];  Service: Endoscopy;  Laterality: N/A;    FAMILY HISTORY: Family History  Problem Relation Age of Onset  . Stroke Mother   . Hypertension Father   . Cancer Brother     Throat cancer  . Kidney  disease Daughter     SOCIAL HISTORY:  reports that he has quit smoking. He has never used smokeless tobacco. He reports that he does not drink alcohol or use illicit drugs.  PERFORMANCE STATUS: The patient's performance status is 3 - Symptomatic, >50% confined to bed  PHYSICAL EXAM: Most Recent Vital Signs: Blood pressure 112/73, pulse 73, temperature 98.7 F  (37.1 C), temperature source Oral, resp. rate 20, height 5\' 7"  (1.702 m), weight 105 lb (47.628 kg), SpO2 98.00%. General appearance: alert, cooperative, appears older than stated age, cachectic and mild distress Head: Normocephalic, without obvious abnormality, atraumatic Eyes: negative findings: lids and lashes normal and conjunctivae and sclerae normal Throat: abnormal findings: edema and dried blood of lips and thrush Lungs: clear to auscultation bilaterally Heart: regular rate and rhythm, S1, S2 normal, no murmur, click, rub or gallop Abdomen: normal findings: bowel sounds normal, no masses palpable, no organomegaly.  Umbilical and left lumbar region discomfort on palpation.  No erythema or discharge at PEG site.  No masses noted on palpation. Extremities: extremities normal, atraumatic, no cyanosis or edema Skin: Skin color, texture, turgor normal. No rashes or lesions Neurologic: Grossly normal  LABORATORY DATA:  Results for orders placed during the hospital encounter of 10/24/12 (from the past 48 hour(s))  LACTIC ACID, PLASMA     Status: Normal   Collection Time   10/24/12  7:45 PM      Component Value Range Comment   Lactic Acid, Venous 1.8  0.5 - 2.2 mmol/L   CBC WITH DIFFERENTIAL     Status: Abnormal   Collection Time   10/24/12  7:45 PM      Component Value Range Comment   WBC 17.7 (*) 4.0 - 10.5 K/uL    RBC 4.36  4.22 - 5.81 MIL/uL    Hemoglobin 14.0  13.0 - 17.0 g/dL    HCT 16.1  09.6 - 04.5 %    MCV 95.2  78.0 - 100.0 fL    MCH 32.1  26.0 - 34.0 pg    MCHC 33.7  30.0 - 36.0 g/dL    RDW 40.9  81.1 - 91.4 %    Platelets 128 (*) 150 - 400 K/uL    Neutrophils Relative 91 (*) 43 - 77 %    Neutro Abs 16.0 (*) 1.7 - 7.7 K/uL    Lymphocytes Relative 5 (*) 12 - 46 %    Lymphs Abs 0.9  0.7 - 4.0 K/uL    Monocytes Relative 3  3 - 12 %    Monocytes Absolute 0.6  0.1 - 1.0 K/uL    Eosinophils Relative 1  0 - 5 %    Eosinophils Absolute 0.2  0.0 - 0.7 K/uL    Basophils  Relative 0  0 - 1 %    Basophils Absolute 0.0  0.0 - 0.1 K/uL   COMPREHENSIVE METABOLIC PANEL     Status: Abnormal   Collection Time   10/24/12  7:45 PM      Component Value Range Comment   Sodium 138  135 - 145 mEq/L    Potassium 4.4  3.5 - 5.1 mEq/L    Chloride 101  96 - 112 mEq/L    CO2 25  19 - 32 mEq/L    Glucose, Bld 122 (*) 70 - 99 mg/dL    BUN 51 (*) 6 - 23 mg/dL    Creatinine, Ser 7.82  0.50 - 1.35 mg/dL    Calcium 9.3  8.4 - 95.6 mg/dL  Total Protein 6.5  6.0 - 8.3 g/dL    Albumin 3.3 (*) 3.5 - 5.2 g/dL    AST 12  0 - 37 U/L    ALT 11  0 - 53 U/L    Alkaline Phosphatase 64  39 - 117 U/L    Total Bilirubin 0.5  0.3 - 1.2 mg/dL    GFR calc non Af Amer 62 (*) >90 mL/min    GFR calc Af Amer 71 (*) >90 mL/min   URINALYSIS, ROUTINE W REFLEX MICROSCOPIC     Status: Normal   Collection Time   10/24/12  8:48 PM      Component Value Range Comment   Color, Urine YELLOW  YELLOW    APPearance CLEAR  CLEAR    Specific Gravity, Urine 1.025  1.005 - 1.030    pH 5.5  5.0 - 8.0    Glucose, UA NEGATIVE  NEGATIVE mg/dL    Hgb urine dipstick NEGATIVE  NEGATIVE    Bilirubin Urine NEGATIVE  NEGATIVE    Ketones, ur NEGATIVE  NEGATIVE mg/dL    Protein, ur NEGATIVE  NEGATIVE mg/dL    Urobilinogen, UA 0.2  0.0 - 1.0 mg/dL    Nitrite NEGATIVE  NEGATIVE    Leukocytes, UA NEGATIVE  NEGATIVE MICROSCOPIC NOT DONE ON URINES WITH NEGATIVE PROTEIN, BLOOD, LEUKOCYTES, NITRITE, OR GLUCOSE <1000 mg/dL.  TROPONIN I     Status: Normal   Collection Time   10/25/12  5:19 AM      Component Value Range Comment   Troponin I <0.30  <0.30 ng/mL   BASIC METABOLIC PANEL     Status: Abnormal   Collection Time   10/25/12  5:19 AM      Component Value Range Comment   Sodium 137  135 - 145 mEq/L    Potassium 4.6  3.5 - 5.1 mEq/L    Chloride 108  96 - 112 mEq/L    CO2 23  19 - 32 mEq/L    Glucose, Bld 118 (*) 70 - 99 mg/dL    BUN 40 (*) 6 - 23 mg/dL    Creatinine, Ser 9.14  0.50 - 1.35 mg/dL    Calcium  8.4  8.4 - 10.5 mg/dL    GFR calc non Af Amer 71 (*) >90 mL/min    GFR calc Af Amer 82 (*) >90 mL/min   CBC     Status: Abnormal   Collection Time   10/25/12  5:19 AM      Component Value Range Comment   WBC 16.2 (*) 4.0 - 10.5 K/uL    RBC 3.67 (*) 4.22 - 5.81 MIL/uL    Hemoglobin 11.9 (*) 13.0 - 17.0 g/dL    HCT 78.2 (*) 95.6 - 52.0 %    MCV 96.2  78.0 - 100.0 fL    MCH 32.4  26.0 - 34.0 pg    MCHC 33.7  30.0 - 36.0 g/dL    RDW 21.3  08.6 - 57.8 %    Platelets 115 (*) 150 - 400 K/uL       RADIOGRAPHY: Dg Chest 2 View  10/24/2012  *RADIOLOGY REPORT*  Clinical Data: Chest pain and weakness, history smoking, squamous cell carcinoma of the esophagus, hypertension  CHEST - 2 VIEW  Comparison: 10/10/2012  Findings: Right arm PICC line, tip projecting over SVC just below aortic arch. Normal heart size, mediastinal contours, and pulmonary vascularity. Lungs appear emphysematous but clear. No pleural effusion or pneumothorax. Osseous structures unremarkable.  IMPRESSION: Changes  of COPD. No acute abnormalities.   Original Report Authenticated By: Ulyses Southward, M.D.        PATHOLOGY:   09/13/2012  Diagnosis  Esophagus, biopsy, mass  SQUAMOUS CELL CARCINOMA. PLEASE SEE COMMENT.  Microscopic Comment  The biopsies are composed of superficial fragments of invasive moderately differentiated squamous cell  carcinoma with associated brisk mitotic activity, cytologic atypia and focal keratin pearl formation. There is no  evidence of angiolymphatic invasion or perineural invasion identified in this material. The case was discussed  with Dr. Karilyn Cota on 09/17/12. (HCL:caf 09/17/12)  Abigail Miyamoto MD  Pathologist, Electronic Signature  (Case signed 09/17/2012)   ASSESSMENT:  1. Stomatitis 2. Oral candidiasis 3. Abdominal pain 4. Nausea/vomiting 5. Squamous cell carcinoma of esophagus, S/P 3 cycles of 5 FU 400 mg/m2/day (starting on 10/11/2012) and concomitant radiation by Dr. Thersa Salt.  5 FU  stopped today at 830-845 AM and this completes his 3rd cycle.  This cancer is potentially curable. 6. Mild thrombocytopenia, reactive versus chemotherapy-induced. 7. Cachexia   PLAN:  1. D/C 5 FU infusion 2. Increase pain medication to 0.5 mg Dilaudid every 2 hours PRN 3. Consult Dr. Keane Police for abdominal pain near PEG tube site and see if there are any further recommendations for patient's stomatitis/oral candidiasis 4. Increase Ondansetron to 8 mg IV every 8 hrs.  Will add Ativan 0.5 mg IV every 4 hours PRN nausea/vomiting.  Will D/C promethazine. 5. Continue with IV Diflucan.  May consider increasing Diflucan to 200 mg IV, but will defer to GI for recommendation.  Patient has a sufficient WBC count. Continue with Magic Mouthwash and Viscous lidocaine 6. Will continue to follow while an inpatient.  All questions were answered. The patient knows to call the clinic with any problems, questions or concerns. We can certainly see the patient much sooner if necessary.  The patient and plan will be discussed with Glenford Peers, MD in the future   [facility]

## 2012-10-25 NOTE — Progress Notes (Signed)
Patient admitted to the hospital earlier this morning by Dr. Phillips Odor.  Patient seen and examined, database reviewed   Patient admitted with intractable nausea and vomiting, stomatitis/oral thrush, odynophagia.  He was recently started on chemotherapy which may have caused the adverse effect leading to his admission.  He has been started on fluconazole and magic mouthwash.   Oncology input appreciated. 5-FU has been discontinued. GI consult has been ordered regarding further assistance with management.  His sister reports that he is normally NPO, and does not take any solids or liquids by mouth.  All nutrition and meds are administered through PEG tube. He receives 4 cans of nutritional supplement daily as bolus feeds. With his ongoing nausea and vomiting, this will likely need to be changed to continuous feeds. We'll ask for a nutrition consult.  He is continued on scheduled zofran and prn ativan for nausea. Will hold off on starting tube feeds until seen by GI.  If no procedures are planned, will restart tube feeds at low rate.  Leukocytosis may be related to recent dexamethasone administration  Continue supportive care.

## 2012-10-25 NOTE — Progress Notes (Signed)
5Fu continuous infusion pump removed per instruction from Dellis Anes, [place].  PICC line flushed with 20ml as documented in MAR.  Nurse Cicily, RN, in charge of patient care, notified that the chemotherapy pump was being removed and that the PICC line was being flushed with saline only and would need to be flushed with Heparin per protocol if they were not going to use the line.  5Fu disposed of in chemotherapy bin and taken to soiled utility so no chemotherapy products were left in patient room.

## 2012-10-26 ENCOUNTER — Other Ambulatory Visit (HOSPITAL_COMMUNITY): Payer: Self-pay | Admitting: Oncology

## 2012-10-26 ENCOUNTER — Encounter (HOSPITAL_COMMUNITY): Payer: Self-pay | Admitting: Internal Medicine

## 2012-10-26 ENCOUNTER — Inpatient Hospital Stay (HOSPITAL_COMMUNITY): Payer: Medicare Other

## 2012-10-26 DIAGNOSIS — K269 Duodenal ulcer, unspecified as acute or chronic, without hemorrhage or perforation: Secondary | ICD-10-CM

## 2012-10-26 DIAGNOSIS — R112 Nausea with vomiting, unspecified: Secondary | ICD-10-CM | POA: Diagnosis present

## 2012-10-26 DIAGNOSIS — R509 Fever, unspecified: Secondary | ICD-10-CM | POA: Diagnosis not present

## 2012-10-26 DIAGNOSIS — D696 Thrombocytopenia, unspecified: Secondary | ICD-10-CM | POA: Diagnosis present

## 2012-10-26 DIAGNOSIS — R1084 Generalized abdominal pain: Secondary | ICD-10-CM | POA: Diagnosis present

## 2012-10-26 DIAGNOSIS — A048 Other specified bacterial intestinal infections: Secondary | ICD-10-CM | POA: Diagnosis present

## 2012-10-26 DIAGNOSIS — E43 Unspecified severe protein-calorie malnutrition: Secondary | ICD-10-CM

## 2012-10-26 DIAGNOSIS — R64 Cachexia: Secondary | ICD-10-CM

## 2012-10-26 HISTORY — DX: Unspecified severe protein-calorie malnutrition: E43

## 2012-10-26 LAB — URINE CULTURE
Colony Count: NO GROWTH
Culture: NO GROWTH

## 2012-10-26 LAB — URINALYSIS, ROUTINE W REFLEX MICROSCOPIC
Ketones, ur: NEGATIVE mg/dL
Protein, ur: NEGATIVE mg/dL
Urobilinogen, UA: 0.2 mg/dL (ref 0.0–1.0)

## 2012-10-26 LAB — CLOSTRIDIUM DIFFICILE BY PCR: Toxigenic C. Difficile by PCR: NEGATIVE

## 2012-10-26 LAB — CBC
HCT: 35.9 % — ABNORMAL LOW (ref 39.0–52.0)
Hemoglobin: 11.8 g/dL — ABNORMAL LOW (ref 13.0–17.0)
MCH: 31.7 pg (ref 26.0–34.0)
MCHC: 32.9 g/dL (ref 30.0–36.0)
MCV: 96.5 fL (ref 78.0–100.0)

## 2012-10-26 LAB — BASIC METABOLIC PANEL
BUN: 30 mg/dL — ABNORMAL HIGH (ref 6–23)
Calcium: 8.6 mg/dL (ref 8.4–10.5)
GFR calc non Af Amer: 62 mL/min — ABNORMAL LOW (ref >90)
Glucose, Bld: 104 mg/dL — ABNORMAL HIGH (ref 70–99)

## 2012-10-26 LAB — URINE MICROSCOPIC-ADD ON

## 2012-10-26 MED ORDER — VANCOMYCIN HCL IN DEXTROSE 1-5 GM/200ML-% IV SOLN
1000.0000 mg | INTRAVENOUS | Status: DC
  Administered 2012-10-26 – 2012-10-28 (×3): 1000 mg via INTRAVENOUS
  Filled 2012-10-26 (×4): qty 200

## 2012-10-26 MED ORDER — FLUCONAZOLE IN SODIUM CHLORIDE 200-0.9 MG/100ML-% IV SOLN
INTRAVENOUS | Status: AC
  Filled 2012-10-26: qty 100

## 2012-10-26 MED ORDER — JEVITY 1.2 CAL PO LIQD
1000.0000 mL | ORAL | Status: DC
  Administered 2012-10-26: 1000 mL
  Filled 2012-10-26 (×3): qty 1000

## 2012-10-26 MED ORDER — KCL IN DEXTROSE-NACL 20-5-0.9 MEQ/L-%-% IV SOLN
INTRAVENOUS | Status: DC
  Administered 2012-10-26 – 2012-10-27 (×2): via INTRAVENOUS

## 2012-10-26 MED ORDER — JEVITY 1.2 CAL PO LIQD
1000.0000 mL | ORAL | Status: DC
  Administered 2012-10-26: 1000 mL
  Filled 2012-10-26 (×2): qty 1000

## 2012-10-26 MED ORDER — JEVITY 1.2 CAL PO LIQD
1000.0000 mL | ORAL | Status: DC
  Filled 2012-10-26 (×2): qty 1000

## 2012-10-26 MED ORDER — PIPERACILLIN-TAZOBACTAM 3.375 G IVPB
3.3750 g | Freq: Three times a day (TID) | INTRAVENOUS | Status: DC
  Administered 2012-10-26 – 2012-10-28 (×7): 3.375 g via INTRAVENOUS
  Filled 2012-10-26 (×10): qty 50

## 2012-10-26 NOTE — Progress Notes (Signed)
Gastric residual via PEG tube checked per MD order q 4 hours. At this time residual 0 cc.  Pt tolerating tube feeding well at this time. No complaints. Will continue to monitor.

## 2012-10-26 NOTE — Progress Notes (Addendum)
Patient ID: Eddie [name], male   DOB: [birth year], 66 y.o.   MRN: [id number] Family in room. Appears very weak. Taking Neb tx at this time.  Unable to to tolerate oral feeding. Filed Vitals:   10/25/12 2100 10/26/12 0500 10/26/12 0825 10/26/12 1006  BP: 126/66 108/65    Pulse: 78 91    Temp: 98.4 F (36.9 C) 101.6 F (38.7 C)  102.4 F (39.1 C)  TempSrc: Oral Oral  Oral  Resp: 18 16    Height:      Weight:      SpO2: 100% 98% 97%   Assessment./Plan  #1.Acute symptoms most likely secondary to toxic effects of radiation and chemotherapy. He also has stomatitis and oral candidiasis. C difficile negative. #2. Dehydration secondary to nausea vomiting and diarrhea being treated with IV fluids.  #  #4. Duodenal ulcer and H. pylori infection which will be treated later. #5. Squamous cell carcinoma of the esophagus; radiation and chemotherapy on hold until acute symptoms have subsided    Will start G tube feeding at 20cc hr.    GI attending note; Patient is tolerating Jevity at 20 ml/hr. Will increase rate to 30 ml/hr. Will increase rate further in am. Goal is 60 ml/hr. Will leave him on continuous feeding while he is in the hospital.

## 2012-10-26 NOTE — Progress Notes (Signed)
ANTIBIOTIC CONSULT NOTE - INITIAL  Pharmacy Consult for Vancomycin and Zosyn Indication: rule out pneumonia  Allergies  Allergen Reactions  . Motrin (Ibuprofen) Swelling    Patient Measurements: Height: 5\' 7"  (170.2 cm) Weight: 105 lb (47.628 kg) IBW/kg (Calculated) : 66.1   Vital Signs: Temp: 102.4 F (39.1 C) (11/22 1006) Temp src: Oral (11/22 1006) BP: 108/65 mmHg (11/22 0500) Pulse Rate: 91  (11/22 0500) Intake/Output from previous day: 11/21 0701 - 11/22 0700 In: 0  Out: 1526 [Urine:1526] Intake/Output from this shift:    Labs:  Basename 10/26/12 0455 10/25/12 0519 10/24/12 1945  WBC 6.4 16.2* 17.7*  HGB 11.8* 11.9* 14.0  PLT 121* 115* 128*  LABCREA -- -- --  CREATININE 1.20 1.08 1.21   Estimated Creatinine Clearance: 41.9 ml/min (by C-G formula based on Cr of 1.2). No results found for this basename: VANCOTROUGH:2,VANCOPEAK:2,VANCORANDOM:2,GENTTROUGH:2,GENTPEAK:2,GENTRANDOM:2,TOBRATROUGH:2,TOBRAPEAK:2,TOBRARND:2,AMIKACINPEAK:2,AMIKACINTROU:2,AMIKACIN:2, in the last 72 hours   Microbiology: Recent Results (from the past 720 hour(s))  CULTURE, BLOOD (ROUTINE X 2)     Status: Normal (Preliminary result)   Collection Time   10/25/12  1:52 AM      Component Value Range Status Comment   Specimen Description BLOOD LEFT ANTECUBITAL   Final    Special Requests     Final    Value: BOTTLES DRAWN AEROBIC AND ANAEROBIC 6CC  IMMUNE:COMPRM   Culture NO GROWTH 1 DAY   Final    Report Status PENDING   Incomplete   CULTURE, BLOOD (ROUTINE X 2)     Status: Normal (Preliminary result)   Collection Time   10/25/12  1:52 AM      Component Value Range Status Comment   Specimen Description BLOOD LEFT FOREARM   Final    Special Requests     Final    Value: BOTTLES DRAWN AEROBIC AND ANAEROBIC 6CC  IMMUNE:COMPRM   Culture NO GROWTH 1 DAY   Final    Report Status PENDING   Incomplete   CLOSTRIDIUM DIFFICILE BY PCR     Status: Normal   Collection Time   10/25/12 11:25 PM   Component Value Range Status Comment   C difficile by pcr NEGATIVE  NEGATIVE Final     Medical History: Past Medical History  Diagnosis Date  . Hypertension   . Chronic back pain   . GERD (gastroesophageal reflux disease)   . Squamous cell carcinoma Oct 2014    Esophogus  . Squamous cell carcinoma of esophagus 09/25/2012  . Severe malnutrition 10/26/2012  . Duodenal ulcer 09/2012  . Helicobacter pylori (H. pylori) infection 09/2012    Medications:  Scheduled:    . albuterol  2.5 mg Nebulization Q6H  . enoxaparin (LOVENOX) injection  30 mg Subcutaneous Q24H  . fluconazole (DIFLUCAN) IV  100 mg Intravenous Q24H  . magic mouthwash  5 mL Oral TID PC & HS   And  . lidocaine  5 mL Mouth/Throat TID PC & HS  . LORazepam  0.25 mg Intravenous Q8H  . ondansetron (ZOFRAN) IV  8 mg Intravenous Q8H  . pantoprazole (PROTONIX) IV  40 mg Intravenous Q12H  . piperacillin-tazobactam (ZOSYN)  IV  3.375 g Intravenous Q8H  . [COMPLETED] pneumococcal 23 valent vaccine  0.5 mL Intramuscular Tomorrow-1000  . general admission iv infusion   Intravenous Q24H  . vancomycin  1,000 mg Intravenous Q24H   Assessment: 66yo AA male with h/o squamous cell carcinoma of the esophagus.  Admitted with fever and complications from radiation and chemotherapy.  Pt is cachectic and  SCr is elevated slightly.  Estimated Creatinine Clearance: 41.9 ml/min (by C-G formula based on Cr of 1.2).  Started on empiric broad spectrum ABX.  Goal of Therapy:  Vancomycin trough level 15-20 mcg/ml  Plan: Zosyn 3.375gm iv q8hrs (4hr infusion) Vancomycin 1gm iv q24hrs Check trough at steady state Monitor labs, renal fxn, and cultures per protocol Duration of therapy per MD  Valrie Hart A 10/26/2012,10:54 AM

## 2012-10-26 NOTE — Progress Notes (Signed)
Subjective: Patient is seen laying in bed.  His sister-in-law and wife, Eddie [name], are at the bedside.  Eddie [name] reports that his pain is much improved.  He reports that his abdominal discomfort has subsided.  His nausea is better as well.    He denies any complaints this AM, but is noted to have hiccups.  Objective: Vital signs in last 24 hours: Temp:  [98.4 F (36.9 C)-101.6 F (38.7 C)] 101.6 F (38.7 C) (11/22 0500) Pulse Rate:  [72-91] 91  (11/22 0500) Resp:  [16-20] 16  (11/22 0500) BP: (108-126)/(65-78) 108/65 mmHg (11/22 0500) SpO2:  [98 %-100 %] 98 % (11/22 0500)  Intake/Output from previous day: 11/21 0800 - 11/22 0759 In: 0  Out: 1526 [Urine:1526] Intake/Output this shift:    General appearance: alert, cooperative, appears older than stated age, cachectic, mild distress and edematous lips  Resp: clear to auscultation bilaterally Cardio: regular rate and rhythm, S1, S2 normal, no murmur, click, rub or gallop GI: soft, non-tender; bowel sounds normal; no masses,  no organomegaly Extremities: extremities normal, atraumatic, no cyanosis or edema  Lab Results:   [facility] 10/26/12 0455 10/25/12 0519  WBC 6.4 16.2*  HGB 11.8* 11.9*  HCT 35.9* 35.3*  PLT 121* 115*   BMET  Basename 10/26/12 0455 10/25/12 0519  NA 143 137  K 4.4 4.6  CL 114* 108  CO2 21 23  GLUCOSE 104* 118*  BUN 30* 40*  CREATININE 1.20 1.08  CALCIUM 8.6 8.4    Studies/Results: Dg Chest 2 View  10/24/2012  *RADIOLOGY REPORT*  Clinical Data: Chest pain and weakness, history smoking, squamous cell carcinoma of the esophagus, hypertension  CHEST - 2 VIEW  Comparison: 10/10/2012  Findings: Right arm PICC line, tip projecting over SVC just below aortic arch. Normal heart size, mediastinal contours, and pulmonary vascularity. Lungs appear emphysematous but clear. No pleural effusion or pneumothorax. Osseous structures unremarkable.  IMPRESSION: Changes of COPD. No acute abnormalities.   Original Report  Authenticated By: Eddie [name], M.D.     Medications: I have reviewed the patient's current medications.  Assessment/Plan: 1. Stomatitis 2. Oral candidiasis, on IV Diflucan  3. Abdominal pain, pain medication ordered  4. Nausea/vomiting, improved 5. Squamous cell carcinoma of esophagus, S/P 3 cycles of 5 FU 400 mg/m2/day (starting on 10/11/2012) and concomitant radiation by Dr. Thersa Salt. 5 FU stopped today at 830-845 AM and this completes his 3rd cycle. This cancer is potentially curable.  6. Mild thrombocytopenia, reactive versus chemotherapy-induced.  7. Cachexia 8. Hiccupps   LOS: 2 days    Eddie [name] 10/26/2012

## 2012-10-26 NOTE — Progress Notes (Signed)
Nutrition Follow-up  Intervention:   -TF recommendations provided in Initial Assessment   Assessment:   Pt tolerating continuous Jevity 1.2 @ 30 ml/hr which is providing 864 kcal, 40 gr protein, and 570 ml water. Current EN meeting 60% minimum energy and 55% protein needs.  Diet Order: Clear Liquids- 0% po intake   Meds: Scheduled Meds:   . albuterol  2.5 mg Nebulization Q6H  . enoxaparin (LOVENOX) injection  30 mg Subcutaneous Q24H  . fluconazole (DIFLUCAN) IV  100 mg Intravenous Q24H  . magic mouthwash  5 mL Oral TID PC & HS   And  . lidocaine  5 mL Mouth/Throat TID PC & HS  . LORazepam  0.25 mg Intravenous Q8H  . ondansetron (ZOFRAN) IV  8 mg Intravenous Q8H  . pantoprazole (PROTONIX) IV  40 mg Intravenous Q12H  . piperacillin-tazobactam (ZOSYN)  IV  3.375 g Intravenous Q8H  . [COMPLETED] pneumococcal 23 valent vaccine  0.5 mL Intramuscular Tomorrow-1000  . general admission iv infusion   Intravenous Q24H  . vancomycin  1,000 mg Intravenous Q24H   Continuous Infusions:   . dextrose 5 % and 0.9 % NaCl with KCl 20 mEq/L 65 mL/hr at 10/26/12 1118  . feeding supplement (JEVITY 1.2 CAL) 1,000 mL (10/26/12 1118)  . [DISCONTINUED] sodium chloride Stopped (10/25/12 0333)   PRN Meds:.acetaminophen, acetaminophen, albuterol, HYDROmorphone (DILAUDID) injection, LORazepam, polyethylene glycol, sodium chloride   CMP     Component Value Date/Time   NA 143 10/26/2012 0455   K 4.4 10/26/2012 0455   CL 114* 10/26/2012 0455   CO2 21 10/26/2012 0455   GLUCOSE 104* 10/26/2012 0455   BUN 30* 10/26/2012 0455   CREATININE 1.20 10/26/2012 0455   CREATININE 1.12 07/30/2012 0950   CALCIUM 8.6 10/26/2012 0455   PROT 6.5 10/24/2012 1945   ALBUMIN 3.3* 10/24/2012 1945   AST 12 10/24/2012 1945   ALT 11 10/24/2012 1945   ALKPHOS 64 10/24/2012 1945   BILITOT 0.5 10/24/2012 1945   GFRNONAA 62* 10/26/2012 0455   GFRAA 72* 10/26/2012 0455    CBG (last 3)  No results found for this basename:  GLUCAP:3 in the last 72 hours   Intake/Output Summary (Last 24 hours) at 10/26/12 1509 Last data filed at 10/26/12 1415  Gross per 24 hour  Intake     30 ml  Output   1976 ml  Net  -1946 ml   Estimated Nutritional Needs:  Kcal:1440-1680 kcal  Protein:72-86 gr  Fluid:1 ml/kcal   NUTRITION DIAGNOSIS:  -Inadequate oral intake (NI-2.1). Status: Ongoing   RELATED TO: esophageal carcinoma   AS EVIDENCE BY: Non-volitional wt loss hx, NPO /Clear Liquids status, increased nutrition needs with catabolic illness   MONITORING/EVALUATION(Goals):  Monitor advancement and tolerance of TF Goal: Pt to meet >/= 90% of their estimated nutrition needs; not met  Weight Status:  105# (47.6 kg) 10/24/12   #[phone]

## 2012-10-26 NOTE — Progress Notes (Signed)
Subjective: The patient says that his abdomen is "sore". His mouth pain is not quite as bad. He acknowledges some loose stools.  Objective: Vital signs in last 24 hours: Filed Vitals:   10/25/12 2100 10/26/12 0500 10/26/12 0825 10/26/12 1006  BP: 126/66 108/65    Pulse: 78 91    Temp: 98.4 F (36.9 C) 101.6 F (38.7 C)  102.4 F (39.1 C)  TempSrc: Oral Oral  Oral  Resp: 18 16    Height:      Weight:      SpO2: 100% 98% 97%     Intake/Output Summary (Last 24 hours) at 10/26/12 1041 Last data filed at 10/26/12 0600  Gross per 24 hour  Intake      0 ml  Output   1526 ml  Net  -1526 ml    Weight change:  Physical exam: General: Cachectic-appearing debilitated 66 year old African-American man sitting up in bed, in no acute distress. Oral pharynx: Crusted over lesions on the lower lip and partially erythematous. His tongue is red, but no obvious white exudates. Difficult to examine his buccal mucosa. Lungs: Clear anteriorly with decreased breath sounds in the bases. Heart: S1, S2, with no murmurs rubs or gallops. Abdomen: Hypoactive bowel sounds, PEG tube in place without surrounding drainage or erythema. Mildly diffusely tender. No masses palpated. Extremities/musculoskeletal: Diffuse muscle atrophy. Pedal pulses barely palpable. Right upper extremity PICC in place. No surrounding erythema or edema or exudates.  Lab Results: Basic Metabolic Panel:  Basename 10/26/12 0455 10/25/12 0519  NA 143 137  K 4.4 4.6  CL 114* 108  CO2 21 23  GLUCOSE 104* 118*  BUN 30* 40*  CREATININE 1.20 1.08  CALCIUM 8.6 8.4  MG -- --  PHOS -- --   Liver Function Tests:  Basename 10/24/12 1945  AST 12  ALT 11  ALKPHOS 64  BILITOT 0.5  PROT 6.5  ALBUMIN 3.3*   No results found for this basename: LIPASE:2,AMYLASE:2 in the last 72 hours No results found for this basename: AMMONIA:2 in the last 72 hours CBC:  Basename 10/26/12 0455 10/25/12 0519 10/24/12 1945  WBC 6.4 16.2* --    NEUTROABS -- -- 16.0*  HGB 11.8* 11.9* --  HCT 35.9* 35.3* --  MCV 96.5 96.2 --  PLT 121* 115* --   Cardiac Enzymes:  Basename 10/25/12 0519  CKTOTAL --  CKMB --  CKMBINDEX --  TROPONINI <0.30   BNP: No results found for this basename: PROBNP:3 in the last 72 hours D-Dimer: No results found for this basename: DDIMER:2 in the last 72 hours CBG: No results found for this basename: GLUCAP:6 in the last 72 hours Hemoglobin A1C: No results found for this basename: HGBA1C in the last 72 hours Fasting Lipid Panel: No results found for this basename: CHOL,HDL,LDLCALC,TRIG,CHOLHDL,LDLDIRECT in the last 72 hours Thyroid Function Tests: No results found for this basename: TSH,T4TOTAL,FREET4,T3FREE,THYROIDAB in the last 72 hours Anemia Panel: No results found for this basename: VITAMINB12,FOLATE,FERRITIN,TIBC,IRON,RETICCTPCT in the last 72 hours Coagulation: No results found for this basename: LABPROT:2,INR:2 in the last 72 hours Urine Drug Screen: Drugs of Abuse  No results found for this basename: labopia, cocainscrnur, labbenz, amphetmu, thcu, labbarb    Alcohol Level: No results found for this basename: ETH:2 in the last 72 hours Urinalysis:  Basename 10/24/12 2048  COLORURINE YELLOW  LABSPEC 1.025  PHURINE 5.5  GLUCOSEU NEGATIVE  HGBUR NEGATIVE  BILIRUBINUR NEGATIVE  KETONESUR NEGATIVE  PROTEINUR NEGATIVE  UROBILINOGEN 0.2  NITRITE NEGATIVE  LEUKOCYTESUR  NEGATIVE   Misc. Labs:   Micro: Recent Results (from the past 240 hour(s))  CULTURE, BLOOD (ROUTINE X 2)     Status: Normal (Preliminary result)   Collection Time   10/25/12  1:52 AM      Component Value Range Status Comment   Specimen Description BLOOD LEFT ANTECUBITAL   Final    Special Requests     Final    Value: BOTTLES DRAWN AEROBIC AND ANAEROBIC 6CC  IMMUNE:COMPRM   Culture NO GROWTH 1 DAY   Final    Report Status PENDING   Incomplete   CULTURE, BLOOD (ROUTINE X 2)     Status: Normal (Preliminary  result)   Collection Time   10/25/12  1:52 AM      Component Value Range Status Comment   Specimen Description BLOOD LEFT FOREARM   Final    Special Requests     Final    Value: BOTTLES DRAWN AEROBIC AND ANAEROBIC 6CC  IMMUNE:COMPRM   Culture NO GROWTH 1 DAY   Final    Report Status PENDING   Incomplete   CLOSTRIDIUM DIFFICILE BY PCR     Status: Normal   Collection Time   10/25/12 11:25 PM      Component Value Range Status Comment   C difficile by pcr NEGATIVE  NEGATIVE Final     Studies/Results: Dg Chest 2 View  10/24/2012  *RADIOLOGY REPORT*  Clinical Data: Chest pain and weakness, history smoking, squamous cell carcinoma of the esophagus, hypertension  CHEST - 2 VIEW  Comparison: 10/10/2012  Findings: Right arm PICC line, tip projecting over SVC just below aortic arch. Normal heart size, mediastinal contours, and pulmonary vascularity. Lungs appear emphysematous but clear. No pleural effusion or pneumothorax. Osseous structures unremarkable.  IMPRESSION: Changes of COPD. No acute abnormalities.   Original Report Authenticated By: Ulyses Southward, M.D.     Medications:  Scheduled:   . albuterol  2.5 mg Nebulization Q6H  . enoxaparin (LOVENOX) injection  30 mg Subcutaneous Q24H  . fluconazole (DIFLUCAN) IV  100 mg Intravenous Q24H  . magic mouthwash  5 mL Oral TID PC & HS   And  . lidocaine  5 mL Mouth/Throat TID PC & HS  . LORazepam  0.25 mg Intravenous Q8H  . ondansetron (ZOFRAN) IV  8 mg Intravenous Q8H  . pantoprazole (PROTONIX) IV  40 mg Intravenous Q12H  . [COMPLETED] pneumococcal 23 valent vaccine  0.5 mL Intramuscular Tomorrow-1000  . general admission iv infusion   Intravenous Q24H   Continuous:   . sodium chloride Stopped (10/25/12 0333)  . feeding supplement (JEVITY 1.2 CAL)     ION:[id number], acetaminophen, albuterol, HYDROmorphone (DILAUDID) injection, LORazepam, polyethylene glycol, sodium chloride  Assessment: Principal Problem:  *Nausea and  vomiting Active Problems:  Squamous cell carcinoma of esophagus  Thrush  Stomatitis and mucositis  Throat pain  Leukocytosis (leucocytosis)  Dehydration  Cachexia  Chemotherapy adverse reaction  Severe malnutrition  Fever  Abdominal pain, generalized  Duodenal ulcer  Helicobacter pylori (H. pylori) infection     1. Nausea and vomiting, likely multifactorial. See below.  Severe stomatitis and oral candidiasis  secondary to chemotherapy. The patient is less symptomatic on IV fluconazole, Magic mouthwash, and viscous lidocaine.  Fever. Etiology unknown at this time. On admission, his chest x-ray and urinalysis were unremarkable. His PICC line was placed prior to this hospitalization. The site does not appear to be infected, but this remains a potential source. He has some abdominal tenderness, but this can  be attributable to his recent diagnosis of a duodenal ulcer. Blood cultures ordered yesterday have been negative to date. The patient is clearly immunocompromised and therefore broad-spectrum antibiotics will be ordered until further clarification.  Duodenal ulcer with H. pylori positivity. This may be a source of his abdominal soreness. He is followed by GI; their assessment and recommendations noted and appreciated. We'll continue twice a day PPI and defer treatment for H. pylori per GI. Gentle tube feeding has been restarted.  Thrombocytopenia. Likely secondary to history of chemotherapy.  Loose stools. C. difficile PCR negative.  Dehydration. Clinically resolving.  Severe malnutrition/cachexia. Recommendations per the registered dietitian noted and appreciated.  Squamous cell carcinoma of the esophagus. Status post chemotherapy and radiation therapy. He is being followed by oncology; their assessment and recommendations noted and appreciated.   Plan: 1. For fever workup, will order another set of blood cultures, chest x-ray, abdominal x-ray, and urinalysis/culture. 2. We'll  start Zosyn and vancomycin empirically. 3. Advancement of PEG feedings deferred to GI. 4. Continue supportive treatment. 5. We'll change IV fluids to D5 normal saline.    LOS: 2 days   Eddie [name] 10/26/2012, 10:41 AM

## 2012-10-27 DIAGNOSIS — C159 Malignant neoplasm of esophagus, unspecified: Secondary | ICD-10-CM

## 2012-10-27 DIAGNOSIS — E41 Nutritional marasmus: Secondary | ICD-10-CM

## 2012-10-27 MED ORDER — FOLIC ACID 1 MG PO TABS
1.0000 mg | ORAL_TABLET | Freq: Every day | ORAL | Status: DC
  Administered 2012-10-27 – 2012-11-07 (×11): 1 mg
  Filled 2012-10-27 (×11): qty 1

## 2012-10-27 MED ORDER — JEVITY 1.2 CAL PO LIQD
1000.0000 mL | ORAL | Status: DC
  Administered 2012-10-27 – 2012-10-28 (×3): 1000 mL
  Filled 2012-10-27 (×4): qty 1000

## 2012-10-27 MED ORDER — VITAMIN B-1 100 MG PO TABS
100.0000 mg | ORAL_TABLET | Freq: Every day | ORAL | Status: DC
  Administered 2012-10-27 – 2012-11-07 (×11): 100 mg
  Filled 2012-10-27 (×11): qty 1

## 2012-10-27 MED ORDER — ENOXAPARIN SODIUM 40 MG/0.4ML ~~LOC~~ SOLN
40.0000 mg | SUBCUTANEOUS | Status: DC
  Administered 2012-10-28 – 2012-10-31 (×4): 40 mg via SUBCUTANEOUS
  Filled 2012-10-27 (×4): qty 0.4

## 2012-10-27 MED ORDER — ALBUTEROL SULFATE (5 MG/ML) 0.5% IN NEBU
2.5000 mg | INHALATION_SOLUTION | Freq: Three times a day (TID) | RESPIRATORY_TRACT | Status: DC
  Administered 2012-10-27 – 2012-10-30 (×9): 2.5 mg via RESPIRATORY_TRACT
  Filled 2012-10-27 (×10): qty 0.5

## 2012-10-27 MED ORDER — KCL IN DEXTROSE-NACL 20-5-0.45 MEQ/L-%-% IV SOLN
INTRAVENOUS | Status: DC
  Administered 2012-10-27: 15:00:00 via INTRAVENOUS
  Administered 2012-10-28: 60 mL via INTRAVENOUS
  Administered 2012-10-29: 1000 mL via INTRAVENOUS

## 2012-10-27 MED ORDER — PANTOPRAZOLE SODIUM 40 MG PO PACK
40.0000 mg | PACK | Freq: Two times a day (BID) | ORAL | Status: DC
  Administered 2012-10-27 – 2012-11-07 (×22): 40 mg
  Filled 2012-10-27 (×37): qty 20

## 2012-10-27 MED ORDER — ADULT MULTIVITAMIN LIQUID CH
5.0000 mL | Freq: Every day | ORAL | Status: DC
  Administered 2012-10-27 – 2012-11-07 (×11): 5 mL
  Filled 2012-10-27 (×14): qty 5

## 2012-10-27 NOTE — Progress Notes (Signed)
The patient is receiving Protonix, MVI, Folic Acid, and Thiamine by the intravenous route.  Based on criteria approved by the Pharmacy and Therapeutics Committee and the Medical Executive Committee, the medication is being converted to the equivalent oral dose form.  These criteria include: -No Active GI bleeding -Able to tolerate diet of full liquids (or better) or tube feeding OR able to tolerate other medications by the oral or enteral route  If you have any questions about this conversion, please contact the Pharmacy Department (ext [phone]).  Thank you.  Mady Gemma, [facility] 10/27/2012 10:50 AM

## 2012-10-27 NOTE — Progress Notes (Signed)
Dr. Karilyn Cota called with new orders to increase tube feeding to 58ml/hr.

## 2012-10-27 NOTE — Progress Notes (Signed)
Subjective: The patient reports no worsening abdominal pain and no worsening mouth pain. Overall, he says that the pain is less, but still his mouth is very much uncomfortable. He had a small loose bowel movement this morning.  Objective: Vital signs in last 24 hours: Filed Vitals:   10/26/12 2107 10/26/12 2115 10/27/12 0510 10/27/12 0751  BP:  92/59 98/64   Pulse:  95 91   Temp:  100.3 F (37.9 C) 100.4 F (38 C)   TempSrc:  Oral Oral   Resp:  20 20   Height:      Weight:   47.628 kg (105 lb)   SpO2: 92% 95% 99% 99%    Intake/Output Summary (Last 24 hours) at 10/27/12 1244 Last data filed at 10/27/12 4098  Gross per 24 hour  Intake   2236 ml  Output    875 ml  Net   1361 ml    Weight change:  Physical exam: General: Cachectic-appearing debilitated 66 year old African-American man sitting up in bed, in no acute distress. Oral pharynx: Crusted over lesions on the lower lip and less erythematous. His tongue is red, but no obvious white exudates. Difficult to examine his buccal mucosa, but underneath his tongue, there are either white exudates or silvery plaques. Lungs: Clear anteriorly with decreased breath sounds in the bases. Heart: S1, S2, with no murmurs rubs or gallops. Abdomen: Hypoactive bowel sounds, PEG tube in place; mild excoriation around the site, but no purulent drainage. Mildly diffusely tender. No masses palpated. Extremities/musculoskeletal: Diffuse muscle atrophy. Pedal pulses barely palpable. Right upper extremity PICC in place. No surrounding erythema or edema or exudates.  Lab Results: Basic Metabolic Panel:  Basename 10/26/12 0455 10/25/12 0519  NA 143 137  K 4.4 4.6  CL 114* 108  CO2 21 23  GLUCOSE 104* 118*  BUN 30* 40*  CREATININE 1.20 1.08  CALCIUM 8.6 8.4  MG -- --  PHOS -- --   Liver Function Tests:  Basename 10/24/12 1945  AST 12  ALT 11  ALKPHOS 64  BILITOT 0.5  PROT 6.5  ALBUMIN 3.3*   No results found for this basename:  LIPASE:2,AMYLASE:2 in the last 72 hours No results found for this basename: AMMONIA:2 in the last 72 hours CBC:  Basename 10/26/12 0455 10/25/12 0519 10/24/12 1945  WBC 6.4 16.2* --  NEUTROABS -- -- 16.0*  HGB 11.8* 11.9* --  HCT 35.9* 35.3* --  MCV 96.5 96.2 --  PLT 121* 115* --   Cardiac Enzymes:  Basename 10/25/12 0519  CKTOTAL --  CKMB --  CKMBINDEX --  TROPONINI <0.30   BNP: No results found for this basename: PROBNP:3 in the last 72 hours D-Dimer: No results found for this basename: DDIMER:2 in the last 72 hours CBG: No results found for this basename: GLUCAP:6 in the last 72 hours Hemoglobin A1C: No results found for this basename: HGBA1C in the last 72 hours Fasting Lipid Panel: No results found for this basename: CHOL,HDL,LDLCALC,TRIG,CHOLHDL,LDLDIRECT in the last 72 hours Thyroid Function Tests: No results found for this basename: TSH,T4TOTAL,FREET4,T3FREE,THYROIDAB in the last 72 hours Anemia Panel: No results found for this basename: VITAMINB12,FOLATE,FERRITIN,TIBC,IRON,RETICCTPCT in the last 72 hours Coagulation: No results found for this basename: LABPROT:2,INR:2 in the last 72 hours Urine Drug Screen: Drugs of Abuse  No results found for this basename: labopia,  cocainscrnur,  labbenz,  amphetmu,  thcu,  labbarb    Alcohol Level: No results found for this basename: ETH:2 in the last 72 hours Urinalysis:  Basename 10/26/12 1539 10/24/12 2048  COLORURINE YELLOW YELLOW  LABSPEC >1.030* 1.025  PHURINE 5.5 5.5  GLUCOSEU NEGATIVE NEGATIVE  HGBUR TRACE* NEGATIVE  BILIRUBINUR NEGATIVE NEGATIVE  KETONESUR NEGATIVE NEGATIVE  PROTEINUR NEGATIVE NEGATIVE  UROBILINOGEN 0.2 0.2  NITRITE NEGATIVE NEGATIVE  LEUKOCYTESUR NEGATIVE NEGATIVE   Misc. Labs:   Micro: Recent Results (from the past 240 hour(s))  URINE CULTURE     Status: Normal   Collection Time   10/24/12  8:48 PM      Component Value Range Status Comment   Specimen Description URINE,  CATHETERIZED   Final    Special Requests NONE   Final    Culture  Setup Time 10/25/2012 14:19   Final    Colony Count NO GROWTH   Final    Culture NO GROWTH   Final    Report Status 10/26/2012 FINAL   Final   CULTURE, BLOOD (ROUTINE X 2)     Status: Normal (Preliminary result)   Collection Time   10/25/12  1:52 AM      Component Value Range Status Comment   Specimen Description BLOOD LEFT ANTECUBITAL   Final    Special Requests     Final    Value: BOTTLES DRAWN AEROBIC AND ANAEROBIC 6CC  IMMUNE:COMPRM   Culture NO GROWTH 1 DAY   Final    Report Status PENDING   Incomplete   CULTURE, BLOOD (ROUTINE X 2)     Status: Normal (Preliminary result)   Collection Time   10/25/12  1:52 AM      Component Value Range Status Comment   Specimen Description BLOOD LEFT FOREARM   Final    Special Requests     Final    Value: BOTTLES DRAWN AEROBIC AND ANAEROBIC 6CC  IMMUNE:COMPRM   Culture NO GROWTH 1 DAY   Final    Report Status PENDING   Incomplete   CLOSTRIDIUM DIFFICILE BY PCR     Status: Normal   Collection Time   10/25/12 11:25 PM      Component Value Range Status Comment   C difficile by pcr NEGATIVE  NEGATIVE Final   CULTURE, BLOOD (ROUTINE X 2)     Status: Normal (Preliminary result)   Collection Time   10/26/12 10:52 AM      Component Value Range Status Comment   Specimen Description BLOOD LEFT ARM   Final    Special Requests BOTTLES DRAWN AEROBIC AND ANAEROBIC  8  CC  EACH   Final    Culture PENDING   Incomplete    Report Status PENDING   Incomplete   CULTURE, BLOOD (ROUTINE X 2)     Status: Normal (Preliminary result)   Collection Time   10/26/12 11:12 AM      Component Value Range Status Comment   Specimen Description A-LINE   Final    Special Requests BOTTLES DRAWN AEROBIC AND ANAEROBIC  6  CC  EACH   Final    Culture PENDING   Incomplete    Report Status PENDING   Incomplete     Studies/Results: Dg Chest Port 1 View  10/26/2012  *RADIOLOGY REPORT*  Clinical Data: Fever   PORTABLE CHEST - 1 VIEW  Comparison: 10/24/2012  Findings: Right arm PICC tip in the SVC is unchanged.  Lungs remain clear without an infiltrate  IMPRESSION: No acute abnormality and no interval change.   Original Report Authenticated By: Janeece Riggers, M.D.    Dg Abd Portable 1v  10/26/2012  *RADIOLOGY REPORT*  Clinical Data: Abdominal pain, fever  PORTABLE ABDOMEN - 1 VIEW  Comparison: PET CT 10/03/2012  Findings: Single abdominal radiograph demonstrates a nonspecific, nonobstructed bowel gas pattern.  Bowel gas noted throughout the colon to the level of the rectum.  A percutaneous gastrostomy tube projects over the gastric body.  No large free air on this single supine radiograph.  Mild lower lumbar degenerative disc disease.  IMPRESSION:  1.  Nonobstructed bowel gas pattern. 2.  Percutaneous G tube projects over the gastric body. 3.  Mild lower lumbar degenerative changes.  A   Original Report Authenticated By: Malachy Moan, M.D.     Medications:  Scheduled:    . albuterol  2.5 mg Nebulization TID  . enoxaparin (LOVENOX) injection  40 mg Subcutaneous Q24H  . fluconazole (DIFLUCAN) IV  100 mg Intravenous Q24H  . folic acid  1 mg Per Tube Daily  . magic mouthwash  5 mL Oral TID PC & HS   And  . lidocaine  5 mL Mouth/Throat TID PC & HS  . LORazepam  0.25 mg Intravenous Q8H  . multivitamin  5 mL Per Tube Daily  . ondansetron (ZOFRAN) IV  8 mg Intravenous Q8H  . pantoprazole sodium  40 mg Per Tube BID  . piperacillin-tazobactam (ZOSYN)  IV  3.375 g Intravenous Q8H  . thiamine  100 mg Per Tube Daily  . vancomycin  1,000 mg Intravenous Q24H  . [DISCONTINUED] albuterol  2.5 mg Nebulization Q6H  . [DISCONTINUED] enoxaparin (LOVENOX) injection  30 mg Subcutaneous Q24H  . [DISCONTINUED] pantoprazole (PROTONIX) IV  40 mg Intravenous Q12H  . [DISCONTINUED] general admission iv infusion   Intravenous Q24H   Continuous:    . dextrose 5 % and 0.9 % NaCl with KCl 20 mEq/L 65 mL/hr at 10/27/12  1122  . feeding supplement (JEVITY 1.2 CAL)    . [DISCONTINUED] feeding supplement (JEVITY 1.2 CAL) 1,000 mL (10/26/12 1118)  . [DISCONTINUED] feeding supplement (JEVITY 1.2 CAL) 1,000 mL (10/26/12 1730)  . [DISCONTINUED] feeding supplement (JEVITY 1.2 CAL) 1,000 mL (10/26/12 2034)   VWU:[id number], acetaminophen, albuterol, HYDROmorphone (DILAUDID) injection, LORazepam, polyethylene glycol, sodium chloride  Assessment: Principal Problem:  *Nausea and vomiting Active Problems:  Squamous cell carcinoma of esophagus  Thrush  Stomatitis and mucositis  Throat pain  Leukocytosis (leucocytosis)  Dehydration  Cachexia  Chemotherapy adverse reaction  Severe malnutrition  Fever  Abdominal pain, generalized  Duodenal ulcer  Helicobacter pylori (H. pylori) infection  Thrombocytopenia     1. Nausea and vomiting, likely multifactorial. See below.  Severe stomatitis and oral candidiasis  secondary to chemotherapy. The patient is still symptomatic, but overall less symptomatic on IV fluconazole, Magic mouthwash, and viscous lidocaine.  Fever. Etiology unknown at this time. Followup chest x-ray reveals no acute abnormalities. His followup urinalysis is unremarkable. Blood cultures ordered and are pending. C. difficile toxin negative. Ova and parasite exam pending. His PICC line was placed prior to this hospitalization. The site does not appear to be infected, but this remains a potential source. He has some abdominal tenderness, but this can be attributable to his recent diagnosis of a duodenal ulcer. We'll continue broad-spectrum antibiotic treatment with Zosyn and vancomycin.  Duodenal ulcer with H. pylori positivity. This may be a source of his abdominal soreness. He is followed by GI; their assessment and recommendations noted and appreciated. We'll continue twice a day PPI and defer treatment for H. pylori per GI. Gentle tube feeding has been restarted. Her rate has been titrated per  GI.  Thrombocytopenia. Likely secondary to history of chemotherapy.  Loose stools. C. difficile PCR negative.  Dehydration. Clinically resolving.  Severe malnutrition/cachexia. Recommendations per the registered dietitian noted and appreciated.  Squamous cell carcinoma of the esophagus. Status post chemotherapy and radiation therapy. He is being followed by oncology; their assessment and recommendations noted and appreciated.   Plan: 1. Continue management as above, but will decrease the rate of IV fluids now but to feeding has been titrated up. 2. If he remains febrile, consider ordering bilateral lower extremity venous Dopplers to rule out DVT and scanning his chest and abdomen. 3. Out of bed to the chair for couple of hours today.    LOS: 3 days   Chrisie Jankovich 10/27/2012, 12:44 PM

## 2012-10-28 ENCOUNTER — Inpatient Hospital Stay (HOSPITAL_COMMUNITY): Payer: Medicare Other

## 2012-10-28 DIAGNOSIS — R509 Fever, unspecified: Secondary | ICD-10-CM

## 2012-10-28 DIAGNOSIS — N289 Disorder of kidney and ureter, unspecified: Secondary | ICD-10-CM | POA: Diagnosis not present

## 2012-10-28 DIAGNOSIS — R066 Hiccough: Secondary | ICD-10-CM | POA: Diagnosis present

## 2012-10-28 LAB — COMPREHENSIVE METABOLIC PANEL
ALT: 6 U/L (ref 0–53)
AST: 9 U/L (ref 0–37)
Albumin: 1.9 g/dL — ABNORMAL LOW (ref 3.5–5.2)
CO2: 19 mEq/L (ref 19–32)
Chloride: 115 mEq/L — ABNORMAL HIGH (ref 96–112)
GFR calc non Af Amer: 53 mL/min — ABNORMAL LOW (ref >90)
Potassium: 3.8 mEq/L (ref 3.5–5.1)
Sodium: 143 mEq/L (ref 135–145)
Total Bilirubin: 0.7 mg/dL (ref 0.3–1.2)

## 2012-10-28 LAB — URINE CULTURE: Culture: NO GROWTH

## 2012-10-28 LAB — VANCOMYCIN, TROUGH: Vancomycin Tr: 5.3 ug/mL — ABNORMAL LOW (ref 10.0–20.0)

## 2012-10-28 LAB — CBC
MCH: 32.8 pg (ref 26.0–34.0)
MCHC: 34.4 g/dL (ref 30.0–36.0)
MCV: 95.5 fL (ref 78.0–100.0)
Platelets: 131 10*3/uL — ABNORMAL LOW (ref 150–400)

## 2012-10-28 MED ORDER — CIPROFLOXACIN IN D5W 200 MG/100ML IV SOLN
200.0000 mg | Freq: Two times a day (BID) | INTRAVENOUS | Status: DC
  Administered 2012-10-29 – 2012-11-01 (×7): 200 mg via INTRAVENOUS
  Filled 2012-10-28 (×10): qty 100

## 2012-10-28 MED ORDER — SODIUM CHLORIDE 0.9 % IN NEBU
INHALATION_SOLUTION | RESPIRATORY_TRACT | Status: AC
  Filled 2012-10-28: qty 3

## 2012-10-28 MED ORDER — CHLORPROMAZINE HCL 25 MG/ML IJ SOLN
10.0000 mg | Freq: Once | INTRAMUSCULAR | Status: AC
  Administered 2012-10-28: 10 mg via INTRAMUSCULAR
  Filled 2012-10-28: qty 2

## 2012-10-28 MED ORDER — VANCOMYCIN HCL 1000 MG IV SOLR
750.0000 mg | Freq: Two times a day (BID) | INTRAVENOUS | Status: DC
  Administered 2012-10-29 – 2012-10-30 (×3): 750 mg via INTRAVENOUS
  Filled 2012-10-28 (×6): qty 750

## 2012-10-28 MED ORDER — ONDANSETRON HCL 4 MG/2ML IJ SOLN
4.0000 mg | Freq: Four times a day (QID) | INTRAMUSCULAR | Status: DC | PRN
  Administered 2012-10-28: 4 mg via INTRAVENOUS
  Filled 2012-10-28: qty 2

## 2012-10-28 MED ORDER — FLUCONAZOLE IN SODIUM CHLORIDE 200-0.9 MG/100ML-% IV SOLN
INTRAVENOUS | Status: AC
  Filled 2012-10-28: qty 100

## 2012-10-28 MED ORDER — CIPROFLOXACIN 500 MG/5ML (10%) PO SUSR: 250.0000 mg | Freq: Two times a day (BID) | ORAL | Status: DC

## 2012-10-28 MED ORDER — METRONIDAZOLE IVPB CUSTOM
250.0000 mg | Freq: Three times a day (TID) | INTRAVENOUS | Status: DC
  Administered 2012-10-28 – 2012-10-30 (×6): 250 mg via INTRAVENOUS
  Filled 2012-10-28 (×9): qty 50

## 2012-10-28 NOTE — Progress Notes (Signed)
ANTIBIOTIC CONSULT NOTE  Pharmacy Consult for Vancomycin and Zosyn Indication: rule out pneumonia  Allergies  Allergen Reactions  . Motrin (Ibuprofen) Swelling    Patient Measurements: Height: 5\' 7"  (170.2 cm) Weight: 105 lb (47.628 kg) IBW/kg (Calculated) : 66.1   Vital Signs: Temp: 98.6 F (37 C) (11/24 0900) Temp src: Oral (11/24 0900) BP: 90/56 mmHg (11/24 0525) Pulse Rate: 95  (11/24 0525) Intake/Output from previous day: 11/23 0701 - 11/24 0700 In: 750 [I.V.:250; IV Piggyback:500] Out: -  Intake/Output from this shift: Total I/O In: 50 [IV Piggyback:50] Out: -   Labs:  Basename 10/28/12 0622 10/26/12 0455  WBC 5.8 6.4  HGB 10.9* 11.8*  PLT 131* 121*  LABCREA -- --  CREATININE 1.38* 1.20   Estimated Creatinine Clearance: 36.4 ml/min (by C-G formula based on Cr of 1.38).  Basename 10/28/12 1230  VANCOTROUGH 5.3*  VANCOPEAK --  Drue Dun --  GENTTROUGH --  GENTPEAK --  GENTRANDOM --  TOBRATROUGH --  TOBRAPEAK --  TOBRARND --  AMIKACINPEAK --  AMIKACINTROU --  AMIKACIN --     Microbiology: Recent Results (from the past 720 hour(s))  URINE CULTURE     Status: Normal   Collection Time   10/24/12  8:48 PM      Component Value Range Status Comment   Specimen Description URINE, CATHETERIZED   Final    Special Requests NONE   Final    Culture  Setup Time 10/25/2012 14:19   Final    Colony Count NO GROWTH   Final    Culture NO GROWTH   Final    Report Status 10/26/2012 FINAL   Final   CULTURE, BLOOD (ROUTINE X 2)     Status: Normal (Preliminary result)   Collection Time   10/25/12  1:52 AM      Component Value Range Status Comment   Specimen Description BLOOD LEFT ANTECUBITAL   Final    Special Requests     Final    Value: BOTTLES DRAWN AEROBIC AND ANAEROBIC 6CC  IMMUNE:COMPRM   Culture NO GROWTH 3 DAYS   Final    Report Status PENDING   Incomplete   CULTURE, BLOOD (ROUTINE X 2)     Status: Normal (Preliminary result)   Collection Time   10/25/12  1:52 AM      Component Value Range Status Comment   Specimen Description BLOOD LEFT FOREARM   Final    Special Requests     Final    Value: BOTTLES DRAWN AEROBIC AND ANAEROBIC 6CC  IMMUNE:COMPRM   Culture NO GROWTH 3 DAYS   Final    Report Status PENDING   Incomplete   CLOSTRIDIUM DIFFICILE BY PCR     Status: Normal   Collection Time   10/25/12 11:25 PM      Component Value Range Status Comment   C difficile by pcr NEGATIVE  NEGATIVE Final   CULTURE, BLOOD (ROUTINE X 2)     Status: Normal (Preliminary result)   Collection Time   10/26/12 10:52 AM      Component Value Range Status Comment   Specimen Description BLOOD LEFT ARM   Final    Special Requests BOTTLES DRAWN AEROBIC AND ANAEROBIC  8  CC  EACH   Final    Culture NO GROWTH 2 DAYS   Final    Report Status PENDING   Incomplete   CULTURE, BLOOD (ROUTINE X 2)     Status: Normal (Preliminary result)   Collection Time  10/26/12 11:12 AM      Component Value Range Status Comment   Specimen Description A-LINE   Final    Special Requests BOTTLES DRAWN AEROBIC AND ANAEROBIC  6  CC  EACH   Final    Culture NO GROWTH 2 DAYS   Final    Report Status PENDING   Incomplete   URINE CULTURE     Status: Normal   Collection Time   10/26/12  3:39 PM      Component Value Range Status Comment   Specimen Description URINE, RANDOM   Final    Special Requests NONE   Final    Culture  Setup Time 10/27/2012 05:34   Final    Colony Count NO GROWTH   Final    Culture NO GROWTH   Final    Report Status 10/28/2012 FINAL   Final     Medical History: Past Medical History  Diagnosis Date  . Hypertension   . Chronic back pain   . GERD (gastroesophageal reflux disease)   . Squamous cell carcinoma Oct 2014    Esophogus  . Squamous cell carcinoma of esophagus 09/25/2012  . Severe malnutrition 10/26/2012  . Duodenal ulcer 09/2012  . Helicobacter pylori (H. pylori) infection 09/2012    Medications:  Scheduled:     . albuterol  2.5  mg Nebulization TID  . chlorproMAZINE  10 mg Intramuscular Once  . enoxaparin (LOVENOX) injection  40 mg Subcutaneous Q24H  . fluconazole (DIFLUCAN) IV  100 mg Intravenous Q24H  . folic acid  1 mg Per Tube Daily  . magic mouthwash  5 mL Oral TID PC & HS   And  . lidocaine  5 mL Mouth/Throat TID PC & HS  . LORazepam  0.25 mg Intravenous Q8H  . multivitamin  5 mL Per Tube Daily  . ondansetron (ZOFRAN) IV  8 mg Intravenous Q8H  . pantoprazole sodium  40 mg Per Tube BID  . piperacillin-tazobactam (ZOSYN)  IV  3.375 g Intravenous Q8H  . thiamine  100 mg Per Tube Daily  . vancomycin  1,000 mg Intravenous Q24H   Assessment: 66yo AA male with h/o squamous cell carcinoma of the esophagus.   Estimated Creatinine Clearance: 36.4 ml/min (by C-G formula based on Cr of 1.38).   Trough below goal.  Goal of Therapy:  Vancomycin trough level 15-20 mcg/ml  Plan: Continue Zosyn 3.375gm iv q8hrs (4hr infusion) Change Vancomycin to 750mg  IV every 12 hours. Repeat trough at steady state Monitor labs, renal fxn, and cultures per protocol Duration of therapy per MD  Mady Gemma 10/28/2012,1:56 PM

## 2012-10-28 NOTE — Progress Notes (Signed)
10/28/12 1149 Patient reported to have episode of vomiting after CT of abdomen. Notified Dr Sherrie Mustache, order placed for zofran 4 mg IV q6h PRN nausea/vomiting. Zofran given as ordered. Notified Dr Sherrie Mustache that scheduled medications held due to vomiting, stated was okay. Nursing to monitor. Dr Sherrie Mustache notified of u/s doppler lower extremities results.  Earnstine Regal, RN

## 2012-10-28 NOTE — Progress Notes (Signed)
Addendum:  I discussed the patient's clinical course and current treatment plan with the patient's wife Mrs. Moriarty and the patient's primary caretaker Mrs. Lorenda Hatchet. Mrs. Lorenda Hatchet is Mrs. Stay's sister. Mrs. Druscilla Brownie has had a stroke and is debilitated.  Mrs. Lorenda Hatchet has been taking care of both Mrs. Jennette Kettle and Mr. Passanisi at home. She says it is getting too much for her. I discussed disposition with them and suggested short-term skilled nursing facility placement for the patient until he became stronger. They were both in agreement with short-term skilled nursing facility placement, however, I did not discuss this with the patient. I asked Mrs. Jennette Kettle and Mrs. Lorenda Hatchet to discuss this option with him first. Maryclare Labrador order PT evaluation for tomorrow.  The results of the CT scan of the chest revealed regression of the proximal esophageal mass but no metastatic disease or infectious process. The CT scan of his abdomen with oral contrast only suggests enteritis, but no obvious abscess or metastatic disease. Bilateral lower extremity venous duplex ultrasound revealed no evidence of DVT. The patient continues to spike through Zosyn and vancomycin, and Diflucan. Given the results of the studies ordered today, we'll discontinue Zosyn and start Cipro and Flagyl empirically. If he continues to spike through these antibiotics, would discontinue all antibiotics and follow. Consider fever from malignancy. Consider drug fever.

## 2012-10-28 NOTE — Progress Notes (Signed)
Subjective: The patient reports no worsening abdominal pain and no worsening mouth pain, but still present.  Objective: Vital signs in last 24 hours: Filed Vitals:   10/27/12 2125 10/28/12 0525 10/28/12 0735 10/28/12 0900  BP: 100/59 90/56    Pulse: 100 95    Temp: 100.9 F (38.3 C) 101.5 F (38.6 C)  98.6 F (37 C)  TempSrc: Oral Oral  Oral  Resp: 20 18    Height:      Weight:      SpO2: 99% 100% 96%     Intake/Output Summary (Last 24 hours) at 10/28/12 1030 Last data filed at 10/28/12 0800  Gross per 24 hour  Intake    750 ml  Output      0 ml  Net    750 ml    Weight change:  Physical exam: General: Cachectic-appearing debilitated 66 year old African-American man sitting up in bed, in no acute distress. Oral pharynx: Crusted over lesions on the lower lip and less erythematous. His tongue is red, but no obvious white exudates. Difficult to examine his buccal mucosa, but underneath his tongue, there are either white exudates or silvery plaques or ulcers. Lungs: Clear anteriorly with decreased breath sounds in the bases. Heart: S1, S2, with no murmurs rubs or gallops. Abdomen: Hypoactive bowel sounds, PEG tube in place; mild excoriation around the site, but no purulent drainage. Mildly diffusely tender. No masses palpated. Extremities/musculoskeletal: Diffuse muscle atrophy. Pedal pulses barely palpable. Right upper extremity PICC in place. No surrounding erythema or edema or exudates.  Lab Results: Basic Metabolic Panel:  Basename 10/28/12 0622 10/26/12 0455  NA 143 143  K 3.8 4.4  CL 115* 114*  CO2 19 21  GLUCOSE 167* 104*  BUN 24* 30*  CREATININE 1.38* 1.20  CALCIUM 7.8* 8.6  MG -- --  PHOS -- --   Liver Function Tests:  [facility] 10/28/12 0622  AST 9  ALT 6  ALKPHOS 38*  BILITOT 0.7  PROT 4.7*  ALBUMIN 1.9*   No results found for this basename: LIPASE:2,AMYLASE:2 in the last 72 hours No results found for this basename: AMMONIA:2 in the last 72  hours CBC:  Basename 10/28/12 0622 10/26/12 0455  WBC 5.8 6.4  NEUTROABS -- --  HGB 10.9* 11.8*  HCT 31.7* 35.9*  MCV 95.5 96.5  PLT 131* 121*   Cardiac Enzymes: No results found for this basename: CKTOTAL:3,CKMB:3,CKMBINDEX:3,TROPONINI:3 in the last 72 hours BNP: No results found for this basename: PROBNP:3 in the last 72 hours D-Dimer: No results found for this basename: DDIMER:2 in the last 72 hours CBG: No results found for this basename: GLUCAP:6 in the last 72 hours Hemoglobin A1C: No results found for this basename: HGBA1C in the last 72 hours Fasting Lipid Panel: No results found for this basename: CHOL,HDL,LDLCALC,TRIG,CHOLHDL,LDLDIRECT in the last 72 hours Thyroid Function Tests: No results found for this basename: TSH,T4TOTAL,FREET4,T3FREE,THYROIDAB in the last 72 hours Anemia Panel: No results found for this basename: VITAMINB12,FOLATE,FERRITIN,TIBC,IRON,RETICCTPCT in the last 72 hours Coagulation: No results found for this basename: LABPROT:2,INR:2 in the last 72 hours Urine Drug Screen: Drugs of Abuse  No results found for this basename: labopia,  cocainscrnur,  labbenz,  amphetmu,  thcu,  labbarb    Alcohol Level: No results found for this basename: ETH:2 in the last 72 hours Urinalysis:  Basename 10/26/12 1539  COLORURINE YELLOW  LABSPEC >1.030*  PHURINE 5.5  GLUCOSEU NEGATIVE  HGBUR TRACE*  BILIRUBINUR NEGATIVE  KETONESUR NEGATIVE  PROTEINUR NEGATIVE  UROBILINOGEN 0.2  NITRITE NEGATIVE  LEUKOCYTESUR NEGATIVE   Misc. Labs:   Micro: Recent Results (from the past 240 hour(s))  URINE CULTURE     Status: Normal   Collection Time   10/24/12  8:48 PM      Component Value Range Status Comment   Specimen Description URINE, CATHETERIZED   Final    Special Requests NONE   Final    Culture  Setup Time 10/25/2012 14:19   Final    Colony Count NO GROWTH   Final    Culture NO GROWTH   Final    Report Status 10/26/2012 FINAL   Final   CULTURE, BLOOD  (ROUTINE X 2)     Status: Normal (Preliminary result)   Collection Time   10/25/12  1:52 AM      Component Value Range Status Comment   Specimen Description BLOOD LEFT ANTECUBITAL   Final    Special Requests     Final    Value: BOTTLES DRAWN AEROBIC AND ANAEROBIC 6CC  IMMUNE:COMPRM   Culture NO GROWTH 3 DAYS   Final    Report Status PENDING   Incomplete   CULTURE, BLOOD (ROUTINE X 2)     Status: Normal (Preliminary result)   Collection Time   10/25/12  1:52 AM      Component Value Range Status Comment   Specimen Description BLOOD LEFT FOREARM   Final    Special Requests     Final    Value: BOTTLES DRAWN AEROBIC AND ANAEROBIC 6CC  IMMUNE:COMPRM   Culture NO GROWTH 3 DAYS   Final    Report Status PENDING   Incomplete   CLOSTRIDIUM DIFFICILE BY PCR     Status: Normal   Collection Time   10/25/12 11:25 PM      Component Value Range Status Comment   C difficile by pcr NEGATIVE  NEGATIVE Final   CULTURE, BLOOD (ROUTINE X 2)     Status: Normal (Preliminary result)   Collection Time   10/26/12 10:52 AM      Component Value Range Status Comment   Specimen Description BLOOD LEFT ARM   Final    Special Requests BOTTLES DRAWN AEROBIC AND ANAEROBIC  8  CC  EACH   Final    Culture NO GROWTH 2 DAYS   Final    Report Status PENDING   Incomplete   CULTURE, BLOOD (ROUTINE X 2)     Status: Normal (Preliminary result)   Collection Time   10/26/12 11:12 AM      Component Value Range Status Comment   Specimen Description A-LINE   Final    Special Requests BOTTLES DRAWN AEROBIC AND ANAEROBIC  6  CC  EACH   Final    Culture NO GROWTH 2 DAYS   Final    Report Status PENDING   Incomplete     Studies/Results: Dg Chest Port 1 View  10/26/2012  *RADIOLOGY REPORT*  Clinical Data: Fever  PORTABLE CHEST - 1 VIEW  Comparison: 10/24/2012  Findings: Right arm PICC tip in the SVC is unchanged.  Lungs remain clear without an infiltrate  IMPRESSION: No acute abnormality and no interval change.   Original  Report Authenticated By: Janeece Riggers, M.D.    Dg Abd Portable 1v  10/26/2012  *RADIOLOGY REPORT*  Clinical Data: Abdominal pain, fever  PORTABLE ABDOMEN - 1 VIEW  Comparison: PET CT 10/03/2012  Findings: Single abdominal radiograph demonstrates a nonspecific, nonobstructed bowel gas pattern.  Bowel gas noted throughout the colon to the  level of the rectum.  A percutaneous gastrostomy tube projects over the gastric body.  No large free air on this single supine radiograph.  Mild lower lumbar degenerative disc disease.  IMPRESSION:  1.  Nonobstructed bowel gas pattern. 2.  Percutaneous G tube projects over the gastric body. 3.  Mild lower lumbar degenerative changes.  A   Original Report Authenticated By: Malachy Moan, M.D.     Medications:  Scheduled:    . albuterol  2.5 mg Nebulization TID  . enoxaparin (LOVENOX) injection  40 mg Subcutaneous Q24H  . fluconazole (DIFLUCAN) IV  100 mg Intravenous Q24H  . folic acid  1 mg Per Tube Daily  . magic mouthwash  5 mL Oral TID PC & HS   And  . lidocaine  5 mL Mouth/Throat TID PC & HS  . LORazepam  0.25 mg Intravenous Q8H  . multivitamin  5 mL Per Tube Daily  . ondansetron (ZOFRAN) IV  8 mg Intravenous Q8H  . pantoprazole sodium  40 mg Per Tube BID  . piperacillin-tazobactam (ZOSYN)  IV  3.375 g Intravenous Q8H  . thiamine  100 mg Per Tube Daily  . vancomycin  1,000 mg Intravenous Q24H  . [DISCONTINUED] enoxaparin (LOVENOX) injection  30 mg Subcutaneous Q24H  . [DISCONTINUED] pantoprazole (PROTONIX) IV  40 mg Intravenous Q12H  . [DISCONTINUED] general admission iv infusion   Intravenous Q24H   Continuous:    . dextrose 5 % and 0.45 % NaCl with KCl 20 mEq/L 60 mL/hr at 10/28/12 0806  . feeding supplement (JEVITY 1.2 CAL) 1,000 mL (10/27/12 2023)  . [DISCONTINUED] dextrose 5 % and 0.9 % NaCl with KCl 20 mEq/L Stopped (10/27/12 1247)  . [DISCONTINUED] feeding supplement (JEVITY 1.2 CAL) 1,000 mL (10/26/12 2034)   OZH:[id number],  acetaminophen, albuterol, HYDROmorphone (DILAUDID) injection, LORazepam, polyethylene glycol, sodium chloride  Assessment: Principal Problem:  *Nausea and vomiting Active Problems:  Squamous cell carcinoma of esophagus  Thrush  Stomatitis and mucositis  Throat pain  Leukocytosis (leucocytosis)  Dehydration  Cachexia  Chemotherapy adverse reaction  Severe malnutrition  Fever  Abdominal pain, generalized  Duodenal ulcer  Helicobacter pylori (H. pylori) infection  Thrombocytopenia  Acute renal insufficiency     1. Nausea and vomiting, likely multifactorial. See below.  Severe stomatitis and oral candidiasis  secondary to chemotherapy. The patient is still symptomatic, but overall less symptomatic on IV fluconazole, Magic mouthwash, and viscous lidocaine.  Fever. Etiology unknown at this time. Followup chest x-ray reveals no acute abnormalities. His followup urinalysis is unremarkable. Blood cultures negative to date x4. C. difficile toxin negative. Ova and parasite exam pending. His PICC line was placed prior to this hospitalization. The site does not appear to be infected, but this remains a potential source. He has some abdominal tenderness, but this can be attributable to his recent diagnosis of a duodenal ulcer. Malignancy causing fever is also a consideration. Broad-spectrum antibiotic treatment was started with Zosyn and vancomycin.  Acute renal insufficiency. The patient's creatinine has increased over the past couple days. Will titrate IV fluids upper lobe. His urine output appears to be nonoliguric.  Duodenal ulcer with H. pylori positivity. This may be a source of his abdominal soreness. He is followed by GI; their assessment and recommendations noted and appreciated. We'll continue twice a day PPI and defer treatment for H. pylori per GI. PEG tube feeding has been restarted, titrated per GI.  Thrombocytopenia. Likely secondary to history of chemotherapy.  Loose stools. C.  difficile PCR negative.  Severe malnutrition/cachexia. Recommendations per the registered dietitian noted and appreciated.  Squamous cell carcinoma of the esophagus. Status post chemotherapy and radiation therapy. He is being followed by oncology.   Plan: 1. IV fluids have been adjusted and titrated up. 2. For further evaluation of fever, will order lower extremity venous ultrasound to rule out DVT, CT of the chest, and CT of the abdomen and pelvis. 3. Continue supportive treatment.   LOS: 4 days   Kimberley Dastrup 10/28/2012, 10:30 AM

## 2012-10-29 DIAGNOSIS — R739 Hyperglycemia, unspecified: Secondary | ICD-10-CM | POA: Diagnosis not present

## 2012-10-29 DIAGNOSIS — D696 Thrombocytopenia, unspecified: Secondary | ICD-10-CM

## 2012-10-29 DIAGNOSIS — A048 Other specified bacterial intestinal infections: Secondary | ICD-10-CM

## 2012-10-29 LAB — BASIC METABOLIC PANEL
BUN: 24 mg/dL — ABNORMAL HIGH (ref 6–23)
Chloride: 116 mEq/L — ABNORMAL HIGH (ref 96–112)
Creatinine, Ser: 1.39 mg/dL — ABNORMAL HIGH (ref 0.50–1.35)
GFR calc Af Amer: 60 mL/min — ABNORMAL LOW (ref >90)
Glucose, Bld: 137 mg/dL — ABNORMAL HIGH (ref 70–99)
Potassium: 4.1 mEq/L (ref 3.5–5.1)

## 2012-10-29 LAB — CBC
MCHC: 33.8 g/dL (ref 30.0–36.0)
Platelets: 161 10*3/uL (ref 150–400)
RDW: 13 % (ref 11.5–15.5)
WBC: 6 10*3/uL (ref 4.0–10.5)

## 2012-10-29 MED ORDER — JEVITY 1.2 CAL PO LIQD
1000.0000 mL | ORAL | Status: DC
  Administered 2012-10-29 – 2012-11-06 (×9): 1000 mL
  Filled 2012-10-29 (×14): qty 1000

## 2012-10-29 MED ORDER — INSULIN ASPART 100 UNIT/ML ~~LOC~~ SOLN: 0.0000 [IU] | Freq: Every day | SUBCUTANEOUS | Status: DC

## 2012-10-29 MED ORDER — FREE WATER
100.0000 mL | Freq: Three times a day (TID) | Status: DC
Start: 2012-10-29 — End: 2012-11-06
  Administered 2012-10-29 – 2012-11-06 (×24): 100 mL

## 2012-10-29 MED ORDER — INSULIN ASPART 100 UNIT/ML ~~LOC~~ SOLN
0.0000 [IU] | Freq: Three times a day (TID) | SUBCUTANEOUS | Status: DC
  Administered 2012-10-31 – 2012-11-03 (×3): 1 [IU] via SUBCUTANEOUS

## 2012-10-29 NOTE — Clinical Social Work Placement (Signed)
Clinical Social Work Department CLINICAL SOCIAL WORK PLACEMENT NOTE 10/29/2012  Patient:  Eddie [name], Eddie [name]  Account Number:  [id number] Admit date:  10/24/2012  Clinical Social Worker:  Derenda Fennel, LCSW  Date/time:  10/29/2012 09:20 AM  Clinical Social Work is seeking post-discharge placement for this patient at the following level of care:   SKILLED NURSING   (*CSW will update this form in Epic as items are completed)   10/29/2012  Patient/family provided with [facility] Department of Clinical Social Work's list of facilities offering this level of care within the geographic area requested by the patient (or if unable, by the patient's family).  10/29/2012  Patient/family informed of their freedom to choose among providers that offer the needed level of care, that participate in Medicare, Medicaid or managed care program needed by the patient, have an available bed and are willing to accept the patient.  10/29/2012  Patient/family informed of [facility]' ownership interest in [facility], as well as of the fact that they are under no obligation to receive care at this facility.  PASARR submitted to EDS on 10/29/2012 PASARR number received from [employer] on 10/29/2012  FL2 transmitted to all facilities in geographic area requested by pt/family on  10/29/2012 FL2 transmitted to all facilities within larger geographic area on   Patient informed that his/her managed care company has contracts with or will negotiate with  certain facilities, including the following:     Patient/family informed of bed offers received:   Patient chooses bed at  Physician recommends and patient chooses bed at    Patient to be transferred to  on   Patient to be transferred to facility by   The following physician request were entered in Epic:   Additional Comments:  Derenda Fennel, LCSW [phone]

## 2012-10-29 NOTE — Progress Notes (Signed)
Nutrition Follow-up  Intervention:   -Recommend continue to advance TF as tolerated to goal rate of 60 ml/hr. -Obtain current body weight  Assessment:   Pt febrile last night but improved this am. His TF has successfully advanced to continuous Jevity 1.2 @ 45 ml/hr, 0 residuals noted. Current regimen is providing 1296 kcal, 60 gr protein, 871 ml water which meets 86% minimum energy and 80% minimum protein needs. He is also receiving a MVI, and Folic Acid daily.  IVF-D51/2 NS @60  ml/hr provides ~244kcal/day   Meds: Scheduled Meds:   . albuterol  2.5 mg Nebulization TID  . [COMPLETED] chlorproMAZINE  10 mg Intramuscular Once  . ciprofloxacin  200 mg Intravenous Q12H  . enoxaparin (LOVENOX) injection  40 mg Subcutaneous Q24H  . fluconazole (DIFLUCAN) IV  100 mg Intravenous Q24H  . folic acid  1 mg Per Tube Daily  . magic mouthwash  5 mL Oral TID PC & HS   And  . lidocaine  5 mL Mouth/Throat TID PC & HS  . LORazepam  0.25 mg Intravenous Q8H  . metronidazole  250 mg Intravenous Q8H  . multivitamin  5 mL Per Tube Daily  . ondansetron (ZOFRAN) IV  8 mg Intravenous Q8H  . pantoprazole sodium  40 mg Per Tube BID  . [EXPIRED] sodium chloride      . thiamine  100 mg Per Tube Daily  . vancomycin  750 mg Intravenous Q12H  . [DISCONTINUED] ciprofloxacin  250 mg Oral BID  . [DISCONTINUED] ciprofloxacin  250 mg Oral BID  . [DISCONTINUED] piperacillin-tazobactam (ZOSYN)  IV  3.375 g Intravenous Q8H  . [DISCONTINUED] vancomycin  1,000 mg Intravenous Q24H   Continuous Infusions:   . dextrose 5 % and 0.45 % NaCl with KCl 20 mEq/L 60 mL (10/28/12 2129)  . feeding supplement (JEVITY 1.2 CAL) 1,000 mL (10/28/12 2119)   PRN Meds:.acetaminophen, acetaminophen, albuterol, HYDROmorphone (DILAUDID) injection, LORazepam, ondansetron, polyethylene glycol, sodium chloride   CMP     Component Value Date/Time   NA 141 10/29/2012 0525   K 4.1 10/29/2012 0525   CL 116* 10/29/2012 0525   CO2 18*  10/29/2012 0525   GLUCOSE 137* 10/29/2012 0525   BUN 24* 10/29/2012 0525   CREATININE 1.39* 10/29/2012 0525   CREATININE 1.12 07/30/2012 0950   CALCIUM 7.7* 10/29/2012 0525   PROT 4.7* 10/28/2012 0622   ALBUMIN 1.9* 10/28/2012 0622   AST 9 10/28/2012 0622   ALT 6 10/28/2012 0622   ALKPHOS 38* 10/28/2012 0622   BILITOT 0.7 10/28/2012 0622   GFRNONAA 52* 10/29/2012 0525   GFRAA 60* 10/29/2012 0525    CBG (last 3)  No results found for this basename: GLUCAP:3 in the last 72 hours   Intake/Output Summary (Last 24 hours) at 10/29/12 1148 Last data filed at 10/29/12 0500  Gross per 24 hour  Intake   2098 ml  Output    650 ml  Net   1448 ml    Weight Status: 105#  (47.6 kg) 10/27/12  Re-estimated needs:  1504-1739 kcal, 75-94 gr protein daily   NUTRITION DIAGNOSIS:  -Inadequate oral intake (NI-2.1). Status: Ongoing   RELATED TO: esophageal carcinoma   AS EVIDENCE BY: Non-volitional wt loss hx, NPO /Clear Liquids status, increased nutrition needs with catabolic illness   [phone]

## 2012-10-29 NOTE — Clinical Social Work Psychosocial (Signed)
Clinical Social Work Department BRIEF PSYCHOSOCIAL ASSESSMENT 10/29/2012  Patient:  Eddie [name], Eddie [name]     Account Number:  [id number]     Admit date:  10/24/2012  Clinical Social Worker:  Nancie Neas  Date/Time:  10/29/2012 10:00 AM  Referred by:  CSW  Date Referred:  10/29/2012 Referred for  SNF Placement   Other Referral:   Interview type:  Patient Other interview type:    PSYCHOSOCIAL DATA Living Status:  FAMILY Admitted from facility:   Level of care:   Primary support name:  Britta Mccreedy Primary support relationship to patient:  FAMILY Degree of support available:   Sister-in-law stays with pt and wife often    CURRENT CONCERNS Current Concerns  Post-Acute Placement   Other Concerns:    SOCIAL WORK ASSESSMENT / PLAN CSW met with pt at bedside following referral from PT for SNF. Pt alert and oriented, but very tired. He states he lives with his wife. His wife's sister is primary caretaker and lives down the street. Pt reports his wife had a stroke years ago but can manage on own for awhile. Pt indicates he was fairly independent in care. He was diagnosed with cancer of the esophagus in October and began treatment. Pt has completed his third round of chemo and has experienced weakness. He reports he is seen at the cancer clinic here at [facility] and goes to [facility] for radiation. Pt has history of ETOH use, but reportedly stopped drinking following diagnosis. CSW discussed SNF process and he is aware of Medicare criteria/coverage. Pt requests [place] facility if possible in order to be close to family.   Assessment/plan status:  Psychosocial Support/Ongoing Assessment of Needs Other assessment/ plan:   Information/referral to community resources:   SNF list    PATIENT'S/FAMILY'S RESPONSE TO PLAN OF CARE: Pt requests CSW to discuss SNF with Britta Mccreedy, pt's sister-in-law. Message left for her and will follow up with bed offers when available.        Derenda Fennel, [place] [phone]

## 2012-10-29 NOTE — Progress Notes (Signed)
Subjective: Patient is seen laying in bed.    He reports that his mouth pain is improved, bhut remains.   He continues to complain of abdominal pain.  This may be secondary to peptic ulcer/H. Pylori infection.  Will defer to GI.   He denies any shaking chills.   Objective: Vital signs in last 24 hours: Temp:  [98.5 F (36.9 C)-102.9 F (39.4 C)] 98.5 F (36.9 C) (11/25 0702) Pulse Rate:  [87-106] 87  (11/25 0702) Resp:  [16-18] 16  (11/25 0702) BP: (99-119)/(66-76) 109/76 mmHg (11/25 0702) SpO2:  [95 %-100 %] 100 % (11/25 0728)  Intake/Output from previous day: 11/24 0800 - 11/25 0759 In: 2148 [I.V.:1054; IV Piggyback:350] Out: 650 [Urine:650] Intake/Output this shift:    General appearance: alert, cooperative, appears older than stated age, cachectic, mild distress and edematous lips  Resp: clear to auscultation bilaterally Cardio: regular rate and rhythm, S1, S2 normal, no murmur, click, rub or gallop GI: soft, non-tender; bowel sounds normal; no masses,  no organomegaly Extremities: extremities normal, atraumatic, no cyanosis or edema  Lab Results:   [facility] 10/29/12 0525 10/28/12 0622  WBC 6.0 5.8  HGB 10.7* 10.9*  HCT 31.7* 31.7*  PLT 161 131*   BMET  Basename 10/29/12 0525 10/28/12 0622  NA 141 143  K 4.1 3.8  CL 116* 115*  CO2 18* 19  GLUCOSE 137* 167*  BUN 24* 24*  CREATININE 1.39* 1.38*  CALCIUM 7.7* 7.8*    Studies/Results: Ct Abdomen Pelvis Wo Contrast  10/28/2012  *RADIOLOGY REPORT*  Clinical Data:  Fever and history of esophageal carcinoma.  CT CHEST, ABDOMEN AND PELVIS WITHOUT CONTRAST  Technique:  Multidetector CT imaging of the chest, abdomen and pelvis was performed following the standard protocol without IV contrast.  Comparison:   None.  CT CHEST  Findings:  There is clear interval regression of the large proximal esophageal mass identified previously by CT after therapy.  No obvious intraluminal mass is identified.  Oral contrast was  injected through an indwelling gastrostomy tube.  There is some reflux of contrast into the distal esophagus.  There is no evidence of mediastinal air or fluid.  No enlarged lymph nodes are identified.  The visualized airway is normally patent.  Lung windows show no evidence of pulmonary infiltrates or nodules.  Minimal scarring present at both lung bases.  No bony abnormalities are identified.  IMPRESSION: Clear regression of proximal esophageal mass.  No evidence of metastatic disease in the chest or infectious process.  CT ABDOMEN AND PELVIS  Findings:  Unenhanced appearance of the solid organs is unremarkable.  There is suggestion of wall thickening involving some loops of proximal small bowel in the abdomen with wall thickness approaching 8 mm.  Findings may be consistent with regional enteritis.  No associated obstruction or bowel perforation.  Fluid present in the distal rectum.  No free fluid or abnormal fluid collections.  A gastrostomy tube enters the stomach.  The bladder is decompressed by a Foley catheter.  No enlarged lymph nodes or focal soft tissue masses are identified.  No evidence of free air.  Degenerative changes in the lower lumbar spine.  No focal bony lesions.  IMPRESSION: Thickening of proximal small bowel suggestive of enteritis.  No focal abscess identified.  No evidence of obvious metastatic disease by unenhanced CT.   Original Report Authenticated By: Irish Lack, M.D.    Ct Chest Wo Contrast  10/28/2012  *RADIOLOGY REPORT*  Clinical Data:  Fever and history of esophageal  carcinoma.  CT CHEST, ABDOMEN AND PELVIS WITHOUT CONTRAST  Technique:  Multidetector CT imaging of the chest, abdomen and pelvis was performed following the standard protocol without IV contrast.  Comparison:   None.  CT CHEST  Findings:  There is clear interval regression of the large proximal esophageal mass identified previously by CT after therapy.  No obvious intraluminal mass is identified.  Oral contrast  was injected through an indwelling gastrostomy tube.  There is some reflux of contrast into the distal esophagus.  There is no evidence of mediastinal air or fluid.  No enlarged lymph nodes are identified.  The visualized airway is normally patent.  Lung windows show no evidence of pulmonary infiltrates or nodules.  Minimal scarring present at both lung bases.  No bony abnormalities are identified.  IMPRESSION: Clear regression of proximal esophageal mass.  No evidence of metastatic disease in the chest or infectious process.  CT ABDOMEN AND PELVIS  Findings:  Unenhanced appearance of the solid organs is unremarkable.  There is suggestion of wall thickening involving some loops of proximal small bowel in the abdomen with wall thickness approaching 8 mm.  Findings may be consistent with regional enteritis.  No associated obstruction or bowel perforation.  Fluid present in the distal rectum.  No free fluid or abnormal fluid collections.  A gastrostomy tube enters the stomach.  The bladder is decompressed by a Foley catheter.  No enlarged lymph nodes or focal soft tissue masses are identified.  No evidence of free air.  Degenerative changes in the lower lumbar spine.  No focal bony lesions.  IMPRESSION: Thickening of proximal small bowel suggestive of enteritis.  No focal abscess identified.  No evidence of obvious metastatic disease by unenhanced CT.   Original Report Authenticated By: Irish Lack, M.D.    US Venous Img Lower Bilateral  10/28/2012  *RADIOLOGY REPORT*  Clinical Data: CHF and lower extremity pain.  BILATERAL LOWER EXTREMITY VENOUS DUPLEX ULTRASOUND  Technique:  Gray-scale sonography with graded compression, as well as color Doppler and duplex ultrasound, were performed to evaluate the deep venous system of both lower extremities from the level of the common femoral vein through the popliteal and proximal calf veins.  Spectral Doppler was utilized to evaluate flow at rest and with distal  augmentation maneuvers.  Comparison:  None.  Findings:  Normal compressibility of bilateral common femoral, superficial femoral, and popliteal veins is demonstrated, as well as the visualized proximal calf veins.  No filling defects to suggest DVT on grayscale or color Doppler imaging.  Doppler waveforms show normal direction of venous flow, normal respiratory phasicity and response to augmentation.  IMPRESSION: No evidence of deep vein thrombosis in either lower extremity.   Original Report Authenticated By: Irish Lack, M.D.     Medications: I have reviewed the patient's current medications.  Assessment/Plan: 1. Stomatitis 2. Oral candidiasis, on IV Diflucan  3. Abdominal pain, followed by GI 4. Nausea/vomiting, improved 5. Squamous cell carcinoma of esophagus, S/P 3 cycles of 5 FU 400 mg/m2/day (starting on 10/11/2012) and concomitant radiation by Dr. Thersa Salt. 5 FU stopped today at 830-845 AM and this completes his 3rd cycle. This cancer is potentially curable. Interval regression of esophageal mass on CT of chest. 6. Mild thrombocytopenia, reactive versus chemotherapy-induced. Resolved  7. Cachexia 8. Severe Malnutrition, on Jevity by GI. 9. Fevers, TMax24h 102.9.  On IV antibiotics.  No source identified at this time.  CT CAP unimpressive with regards to cause of fevers.  Negative [place] of LE  for DVT. 10. Duodenal ulcer with H. Pylori positivity, will defer to GI.   LOS: 5 days    Mashelle Busick 10/29/2012

## 2012-10-29 NOTE — Plan of Care (Signed)
Problem: Phase III Progression Outcomes Goal: Activity at appropriate level-compared to baseline (UP IN CHAIR FOR HEMODIALYSIS)  Pt up with assist to bsc, working with pt Goal: Discharge plan remains appropriate-arrangements made Outcome: Progressing Poss snf placement

## 2012-10-29 NOTE — Progress Notes (Signed)
Subjective: The patient reports no worsening abdominal pain or mouth pain.  Objective: Vital signs in last 24 hours: Filed Vitals:   10/29/12 0702 10/29/12 0728 10/29/12 0857 10/29/12 1315  BP: 109/76  102/71 120/78  Pulse: 87   93  Temp: 98.5 F (36.9 C)   98.9 F (37.2 C)  TempSrc: Oral     Resp: 16   18  Height:      Weight:      SpO2: 100% 100%  99%    Intake/Output Summary (Last 24 hours) at 10/29/12 1323 Last data filed at 10/29/12 0500  Gross per 24 hour  Intake   2098 ml  Output    650 ml  Net   1448 ml    Weight change:  Physical exam: General: Cachectic-appearing debilitated 66 year old African-American man sitting up in bed, in no acute distress. Oral pharynx: Crusted over lesions on the lower lip and less erythematous. His tongue is red, but no obvious white exudates. Difficult to examine his buccal mucosa, but underneath his tongue, there are fewer white exudates or silvery plaques or ulcers. Lungs: Clear anteriorly with decreased breath sounds in the bases. Heart: S1, S2, with no murmurs rubs or gallops. Abdomen: Hypoactive bowel sounds, PEG tube in place; mild excoriation around the site, but no purulent drainage. Mildly diffusely tender. No masses palpated. Extremities/musculoskeletal: Diffuse muscle atrophy. Pedal pulses barely palpable. Right upper extremity PICC in place. No surrounding erythema or edema or exudates.  Lab Results: Basic Metabolic Panel:  Basename 10/29/12 0525 10/28/12 0622  NA 141 143  K 4.1 3.8  CL 116* 115*  CO2 18* 19  GLUCOSE 137* 167*  BUN 24* 24*  CREATININE 1.39* 1.38*  CALCIUM 7.7* 7.8*  MG -- --  PHOS -- --   Liver Function Tests:  [facility] 10/28/12 0622  AST 9  ALT 6  ALKPHOS 38*  BILITOT 0.7  PROT 4.7*  ALBUMIN 1.9*   No results found for this basename: LIPASE:2,AMYLASE:2 in the last 72 hours No results found for this basename: AMMONIA:2 in the last 72 hours CBC:  Basename 10/29/12 0525 10/28/12 0622    WBC 6.0 5.8  NEUTROABS -- --  HGB 10.7* 10.9*  HCT 31.7* 31.7*  MCV 95.5 95.5  PLT 161 131*   Cardiac Enzymes: No results found for this basename: CKTOTAL:3,CKMB:3,CKMBINDEX:3,TROPONINI:3 in the last 72 hours BNP: No results found for this basename: PROBNP:3 in the last 72 hours D-Dimer: No results found for this basename: DDIMER:2 in the last 72 hours CBG: No results found for this basename: GLUCAP:6 in the last 72 hours Hemoglobin A1C: No results found for this basename: HGBA1C in the last 72 hours Fasting Lipid Panel: No results found for this basename: CHOL,HDL,LDLCALC,TRIG,CHOLHDL,LDLDIRECT in the last 72 hours Thyroid Function Tests: No results found for this basename: TSH,T4TOTAL,FREET4,T3FREE,THYROIDAB in the last 72 hours Anemia Panel: No results found for this basename: VITAMINB12,FOLATE,FERRITIN,TIBC,IRON,RETICCTPCT in the last 72 hours Coagulation: No results found for this basename: LABPROT:2,INR:2 in the last 72 hours Urine Drug Screen: Drugs of Abuse  No results found for this basename: labopia,  cocainscrnur,  labbenz,  amphetmu,  thcu,  labbarb    Alcohol Level: No results found for this basename: ETH:2 in the last 72 hours Urinalysis:  Basename 10/26/12 1539  COLORURINE YELLOW  LABSPEC >1.030*  PHURINE 5.5  GLUCOSEU NEGATIVE  HGBUR TRACE*  BILIRUBINUR NEGATIVE  KETONESUR NEGATIVE  PROTEINUR NEGATIVE  UROBILINOGEN 0.2  NITRITE NEGATIVE  LEUKOCYTESUR NEGATIVE   Misc. Labs:  Micro: Recent Results (from the past 240 hour(s))  URINE CULTURE     Status: Normal   Collection Time   10/24/12  8:48 PM      Component Value Range Status Comment   Specimen Description URINE, CATHETERIZED   Final    Special Requests NONE   Final    Culture  Setup Time 10/25/2012 14:19   Final    Colony Count NO GROWTH   Final    Culture NO GROWTH   Final    Report Status 10/26/2012 FINAL   Final   CULTURE, BLOOD (ROUTINE X 2)     Status: Normal (Preliminary result)    Collection Time   10/25/12  1:52 AM      Component Value Range Status Comment   Specimen Description BLOOD LEFT ANTECUBITAL   Final    Special Requests     Final    Value: BOTTLES DRAWN AEROBIC AND ANAEROBIC 6CC  IMMUNE:COMPRM   Culture NO GROWTH 4 DAYS   Final    Report Status PENDING   Incomplete   CULTURE, BLOOD (ROUTINE X 2)     Status: Normal (Preliminary result)   Collection Time   10/25/12  1:52 AM      Component Value Range Status Comment   Specimen Description BLOOD LEFT FOREARM   Final    Special Requests     Final    Value: BOTTLES DRAWN AEROBIC AND ANAEROBIC 6CC  IMMUNE:COMPRM   Culture NO GROWTH 4 DAYS   Final    Report Status PENDING   Incomplete   STOOL CULTURE     Status: Normal (Preliminary result)   Collection Time   10/25/12 11:24 PM      Component Value Range Status Comment   Specimen Description PERIRECTAL   Final    Special Requests NONE   Final    Culture NO SUSPICIOUS COLONIES, CONTINUING TO HOLD   Final    Report Status PENDING   Incomplete   CLOSTRIDIUM DIFFICILE BY PCR     Status: Normal   Collection Time   10/25/12 11:25 PM      Component Value Range Status Comment   C difficile by pcr NEGATIVE  NEGATIVE Final   CULTURE, BLOOD (ROUTINE X 2)     Status: Normal (Preliminary result)   Collection Time   10/26/12 10:52 AM      Component Value Range Status Comment   Specimen Description BLOOD LEFT ARM   Final    Special Requests BOTTLES DRAWN AEROBIC AND ANAEROBIC  8  CC  EACH   Final    Culture NO GROWTH 3 DAYS   Final    Report Status PENDING   Incomplete   CULTURE, BLOOD (ROUTINE X 2)     Status: Normal (Preliminary result)   Collection Time   10/26/12 11:12 AM      Component Value Range Status Comment   Specimen Description A-LINE   Final    Special Requests BOTTLES DRAWN AEROBIC AND ANAEROBIC  6  CC  EACH   Final    Culture NO GROWTH 3 DAYS   Final    Report Status PENDING   Incomplete   URINE CULTURE     Status: Normal   Collection Time    10/26/12  3:39 PM      Component Value Range Status Comment   Specimen Description URINE, RANDOM   Final    Special Requests NONE   Final    Culture  Setup Time  10/27/2012 05:34   Final    Colony Count NO GROWTH   Final    Culture NO GROWTH   Final    Report Status 10/28/2012 FINAL   Final   CLOSTRIDIUM DIFFICILE BY PCR     Status: Normal   Collection Time   10/29/12  2:00 AM      Component Value Range Status Comment   C difficile by pcr NEGATIVE  NEGATIVE Final     Studies/Results: Ct Abdomen Pelvis Wo Contrast  10/28/2012  *RADIOLOGY REPORT*  Clinical Data:  Fever and history of esophageal carcinoma.  CT CHEST, ABDOMEN AND PELVIS WITHOUT CONTRAST  Technique:  Multidetector CT imaging of the chest, abdomen and pelvis was performed following the standard protocol without IV contrast.  Comparison:   None.  CT CHEST  Findings:  There is clear interval regression of the large proximal esophageal mass identified previously by CT after therapy.  No obvious intraluminal mass is identified.  Oral contrast was injected through an indwelling gastrostomy tube.  There is some reflux of contrast into the distal esophagus.  There is no evidence of mediastinal air or fluid.  No enlarged lymph nodes are identified.  The visualized airway is normally patent.  Lung windows show no evidence of pulmonary infiltrates or nodules.  Minimal scarring present at both lung bases.  No bony abnormalities are identified.  IMPRESSION: Clear regression of proximal esophageal mass.  No evidence of metastatic disease in the chest or infectious process.  CT ABDOMEN AND PELVIS  Findings:  Unenhanced appearance of the solid organs is unremarkable.  There is suggestion of wall thickening involving some loops of proximal small bowel in the abdomen with wall thickness approaching 8 mm.  Findings may be consistent with regional enteritis.  No associated obstruction or bowel perforation.  Fluid present in the distal rectum.  No free fluid  or abnormal fluid collections.  A gastrostomy tube enters the stomach.  The bladder is decompressed by a Foley catheter.  No enlarged lymph nodes or focal soft tissue masses are identified.  No evidence of free air.  Degenerative changes in the lower lumbar spine.  No focal bony lesions.  IMPRESSION: Thickening of proximal small bowel suggestive of enteritis.  No focal abscess identified.  No evidence of obvious metastatic disease by unenhanced CT.   Original Report Authenticated By: Irish Lack, M.D.    Ct Chest Wo Contrast  10/28/2012  *RADIOLOGY REPORT*  Clinical Data:  Fever and history of esophageal carcinoma.  CT CHEST, ABDOMEN AND PELVIS WITHOUT CONTRAST  Technique:  Multidetector CT imaging of the chest, abdomen and pelvis was performed following the standard protocol without IV contrast.  Comparison:   None.  CT CHEST  Findings:  There is clear interval regression of the large proximal esophageal mass identified previously by CT after therapy.  No obvious intraluminal mass is identified.  Oral contrast was injected through an indwelling gastrostomy tube.  There is some reflux of contrast into the distal esophagus.  There is no evidence of mediastinal air or fluid.  No enlarged lymph nodes are identified.  The visualized airway is normally patent.  Lung windows show no evidence of pulmonary infiltrates or nodules.  Minimal scarring present at both lung bases.  No bony abnormalities are identified.  IMPRESSION: Clear regression of proximal esophageal mass.  No evidence of metastatic disease in the chest or infectious process.  CT ABDOMEN AND PELVIS  Findings:  Unenhanced appearance of the solid organs is unremarkable.  There  is suggestion of wall thickening involving some loops of proximal small bowel in the abdomen with wall thickness approaching 8 mm.  Findings may be consistent with regional enteritis.  No associated obstruction or bowel perforation.  Fluid present in the distal rectum.  No free  fluid or abnormal fluid collections.  A gastrostomy tube enters the stomach.  The bladder is decompressed by a Foley catheter.  No enlarged lymph nodes or focal soft tissue masses are identified.  No evidence of free air.  Degenerative changes in the lower lumbar spine.  No focal bony lesions.  IMPRESSION: Thickening of proximal small bowel suggestive of enteritis.  No focal abscess identified.  No evidence of obvious metastatic disease by unenhanced CT.   Original Report Authenticated By: Irish Lack, M.D.    US Venous Img Lower Bilateral  10/28/2012  *RADIOLOGY REPORT*  Clinical Data: CHF and lower extremity pain.  BILATERAL LOWER EXTREMITY VENOUS DUPLEX ULTRASOUND  Technique:  Gray-scale sonography with graded compression, as well as color Doppler and duplex ultrasound, were performed to evaluate the deep venous system of both lower extremities from the level of the common femoral vein through the popliteal and proximal calf veins.  Spectral Doppler was utilized to evaluate flow at rest and with distal augmentation maneuvers.  Comparison:  None.  Findings:  Normal compressibility of bilateral common femoral, superficial femoral, and popliteal veins is demonstrated, as well as the visualized proximal calf veins.  No filling defects to suggest DVT on grayscale or color Doppler imaging.  Doppler waveforms show normal direction of venous flow, normal respiratory phasicity and response to augmentation.  IMPRESSION: No evidence of deep vein thrombosis in either lower extremity.   Original Report Authenticated By: Irish Lack, M.D.     Medications:  Scheduled:    . albuterol  2.5 mg Nebulization TID  . [COMPLETED] chlorproMAZINE  10 mg Intramuscular Once  . ciprofloxacin  200 mg Intravenous Q12H  . enoxaparin (LOVENOX) injection  40 mg Subcutaneous Q24H  . fluconazole (DIFLUCAN) IV  100 mg Intravenous Q24H  . folic acid  1 mg Per Tube Daily  . magic mouthwash  5 mL Oral TID PC & HS   And  .  lidocaine  5 mL Mouth/Throat TID PC & HS  . LORazepam  0.25 mg Intravenous Q8H  . metronidazole  250 mg Intravenous Q8H  . multivitamin  5 mL Per Tube Daily  . ondansetron (ZOFRAN) IV  8 mg Intravenous Q8H  . pantoprazole sodium  40 mg Per Tube BID  . [EXPIRED] sodium chloride      . thiamine  100 mg Per Tube Daily  . vancomycin  750 mg Intravenous Q12H  . [DISCONTINUED] ciprofloxacin  250 mg Oral BID  . [DISCONTINUED] ciprofloxacin  250 mg Oral BID  . [DISCONTINUED] piperacillin-tazobactam (ZOSYN)  IV  3.375 g Intravenous Q8H  . [DISCONTINUED] vancomycin  1,000 mg Intravenous Q24H   Continuous:    . dextrose 5 % and 0.45 % NaCl with KCl 20 mEq/L 60 mL (10/28/12 2129)  . feeding supplement (JEVITY 1.2 CAL) 1,000 mL (10/28/12 2119)   ZOX:[id number], acetaminophen, albuterol, HYDROmorphone (DILAUDID) injection, LORazepam, ondansetron, polyethylene glycol, sodium chloride  Assessment: Principal Problem:  *Nausea and vomiting Active Problems:  Squamous cell carcinoma of esophagus  Thrush  Stomatitis and mucositis  Throat pain  Leukocytosis (leucocytosis)  Dehydration  Cachexia  Chemotherapy adverse reaction  Severe malnutrition  Fever  Abdominal pain, generalized  Duodenal ulcer  Helicobacter pylori (H. pylori) infection  Thrombocytopenia  Acute renal insufficiency  Hiccups     1. Nausea and vomiting, likely multifactorial. See below.  Severe stomatitis and oral candidiasis  secondary to chemotherapy. The patient is still symptomatic, but overall less symptomatic on IV fluconazole, Magic mouthwash, and viscous lidocaine.  Fever. Etiology unknown at this time. He is afebrile now but continues to have episodic fevers. Followup chest x-ray reveals no acute abnormalities. His followup urinalysis is unremarkable. Blood cultures negative to date x4. Urine culture is negative. C. difficile toxin negative. Ova and parasite exam pending. Stool culture negative. Lower extremity  ultrasound negative for DVT. His PICC line was placed prior to this hospitalization. The site does not appear to be infected, but this remains a potential source. He has some abdominal tenderness, but this can be attributable to his recent diagnosis of a duodenal ulcer. Malignancy causing fever is also a consideration. Broad-spectrum antibiotic treatment was started with Zosyn and vancomycin, but was discontinued when the CT scans revealed only possible gastroenteritis. He has known H. pylori positivity. Cipro and Flagyl were started and Zosyn was discontinued.  Acute renal insufficiency. The patient's creatinine has increased over the past couple days. IV fluids were titrated up. We'll add free water between PEG feedings. His urine output appears to be nonoliguric.  Duodenal ulcer with H. pylori positivity. This may be a source of his abdominal soreness. He is followed by GI; their assessment and recommendations noted and appreciated. We'll continue twice a day PPI and defer treatment for H. pylori per GI. Flagyl and Cipro were started which will likely cover H. pylori in part. Continue PEG tube feeding.  Thrombocytopenia. Likely secondary to history of chemotherapy.  Loose stools. C. difficile PCR negative.  Severe malnutrition/cachexia. Recommendations per the registered dietitian noted and appreciated. We'll titrate up take feedings.  Squamous cell carcinoma of the esophagus. Status post chemotherapy and radiation therapy. He is being followed by oncology.  Disposition: PT evaluated the patient recommended skilled nursing facility placement. The patient is in agreement with skilled nursing facility placement short-term for rehabilitation. This was discussed with his wife and sister-in-law yesterday. (See note yesterday). It is likely that the patient could be discharged in the next 24-48 hours to skilled nursing facility of choice. We'll need to make arrangements for ongoing radiation therapy per  recommendations by oncology.    Plan: 1. Add free water to PEG feedings. Will titrate the rate up to 55 cc an hour. 2. Further recommendations per oncology and gastroenterology would be appreciated. 3. Anticipate discharge over the next 24-48 hours.   LOS: 5 days   Jadakiss Barish 10/29/2012, 1:23 PM

## 2012-10-29 NOTE — Evaluation (Signed)
Physical Therapy Evaluation Patient Details Name: Eddie [name] MRN: [id number] DOB: [birth year] Today's Date: 10/29/2012 Time: 6213-0865 PT Time Calculation (min): 27 min  PT Assessment / Plan / Recommendation Clinical Impression  Pt is seen for eval and found to be very deconditioned due to significant weight loss/side effects of oncology tx for esophageal CA.  He atates that he had been independent PTA, but is now significantly deconditioned  and has orthostatic hypotension.  He would be appropriate for SNF, but I am not sure that he could tolerate it.  The pt is agreeable to try.    PT Assessment  Patient needs continued PT services    Follow Up Recommendations  SNF    Does the patient have the potential to tolerate intense rehabilitation      Barriers to Discharge None      Equipment Recommendations  None recommended by PT    Recommendations for Other Services     Frequency Min 3X/week    Precautions / Restrictions Precautions Precautions: Fall Restrictions Weight Bearing Restrictions: No   Pertinent Vitals/Pain       Mobility  Bed Mobility Bed Mobility: Supine to Sit;Sit to Supine Supine to Sit: 6: Modified independent (Device/Increase time);HOB elevated;With rails Sit to Supine: 6: Modified independent (Device/Increase time);HOB elevated Transfers Transfers: Sit to Stand;Stand to Sit Sit to Stand: From bed;5: Supervision;With upper extremity assist Stand to Sit: 5: Supervision;To bed;With upper extremity assist Details for Transfer Assistance: pt unable to stand more than 30 sec before having to sit...has orthostasis...sitting BP=102/71, standing BP= 88/65 Ambulation/Gait Ambulation/Gait Assistance: Not tested (comment) (due to orthostasis)    Shoulder Instructions     Exercises     PT Diagnosis: Difficulty walking;Generalized weakness  PT Problem List: Decreased strength;Decreased activity tolerance;Decreased mobility;Decreased knowledge of use of  DME;Decreased safety awareness;Cardiopulmonary status limiting activity PT Treatment Interventions: Gait training;Functional mobility training;Therapeutic exercise;Patient/family education   PT Goals Acute Rehab PT Goals PT Goal Formulation: With patient Pt will Ambulate: 51 - 150 feet;with supervision;with least restrictive assistive device PT Goal: Ambulate - Progress: Goal set today  Visit Information  Last PT Received On: 10/29/12    Subjective Data  Subjective: feels OK Patient Stated Goal: none sated   Prior Functioning  Home Living Lives With: Spouse Available Help at Discharge: Family;Available 24 hours/day Type of Home: House Home Access: Level entry Home Layout: One level Bathroom Toilet: Standard Home Adaptive Equipment: None Prior Function Level of Independence: Independent Able to Take Stairs?: Yes Driving: No Vocation: Retired Musician: No difficulties    Cognition  Overall Cognitive Status: Appears within functional limits for tasks assessed/performed Arousal/Alertness: Awake/alert Orientation Level: Appears intact for tasks assessed Behavior During Session: [facility] for tasks performed    Extremity/Trunk Assessment Right Lower Extremity Assessment RLE ROM/Strength/Tone: WFL for tasks assessed RLE Sensation: WFL - Light Touch RLE Coordination: WFL - gross motor Left Lower Extremity Assessment LLE ROM/Strength/Tone: WFL for tasks assessed LLE Sensation: WFL - Light Touch LLE Coordination: WFL - gross motor Trunk Assessment Trunk Assessment: Normal   Balance    End of Session PT - End of Session Equipment Utilized During Treatment: Gait belt Activity Tolerance: Treatment limited secondary to medical complications (Comment) (orthostatic hypotension) Patient left: in bed (tried to have him sit in chair, but he declines) Nurse Communication: Mobility status  GP     Myrlene Broker L 10/29/2012, 9:05 AM

## 2012-10-29 NOTE — Evaluation (Signed)
Occupational Therapy Evaluation Patient Details Name: Eddie [name] MRN: [id number] DOB: 12/28/1945 Today's Date: 10/29/2012 Time: 1191-4782 OT Time Calculation (min): 29 min  OT Assessment / Plan / Recommendation Clinical Impression  Patient is  a 66 y/o male s/p Nausea and vomiting presenting to acute OT with deficits below. Patient will benefit from OT services to increase ADL performance, functional transfers, energy conservation, and Bil UE strength and endurance.    OT Assessment  Patient needs continued OT Services    Follow Up Recommendations  SNF       Equipment Recommendations  3 in 1 bedside comode;Tub/shower seat       Frequency  Min 2X/week    Precautions / Restrictions Precautions Precautions: Fall   Pertinent Vitals/Pain No complaints.    ADL  Lower Body Dressing: Performed;Supervision/safety Where Assessed - Lower Body Dressing: Unsupported sit to stand    OT Diagnosis: Generalized weakness  OT Problem List: Decreased strength;Decreased activity tolerance;Impaired balance (sitting and/or standing) OT Treatment Interventions: Therapeutic exercise;Self-care/ADL training;Energy conservation;Therapeutic activities;Patient/family education;Balance training   OT Goals Acute Rehab OT Goals OT Goal Formulation: With patient Time For Goal Achievement: 11/12/12 Potential to Achieve Goals: Good ADL Goals Pt Will Perform Grooming: with modified independence;Sitting, edge of bed ADL Goal: Grooming - Progress: Goal set today Pt Will Perform Upper Body Bathing: with modified independence;Sitting, edge of bed ADL Goal: Upper Body Bathing - Progress: Goal set today Pt Will Perform Lower Body Bathing: with modified independence;Sit to stand from bed ADL Goal: Lower Body Bathing - Progress: Goal set today Pt Will Perform Upper Body Dressing: with modified independence;Sitting, bed ADL Goal: Upper Body Dressing - Progress: Goal set today Pt Will Perform Lower Body  Dressing: with modified independence;Sit to stand from bed ADL Goal: Lower Body Dressing - Progress: Goal set today Pt Will Transfer to Toilet: with supervision;Regular height toilet;Ambulation ADL Goal: Toilet Transfer - Progress: Goal set today Pt Will Perform Toileting - Clothing Manipulation: with modified independence;Standing ADL Goal: Toileting - Clothing Manipulation - Progress: Goal set today Pt Will Perform Toileting - Hygiene: with modified independence;Sit to stand from 3-in-1/toilet;Leaning right and/or left on 3-in-1/toilet ADL Goal: Toileting - Hygiene - Progress: Goal set today Arm Goals Pt Will Complete Theraband Exer: with supervision, verbal cues required/provided;to increase strength;Bilateral upper extremities;2 sets;10 reps;Level 2 Theraband Arm Goal: Theraband Exercises - Progress: Goal set today Miscellaneous OT Goals Miscellaneous OT Goal #1: Patient will tolerate sitting on EOB for 15 minutes to increase core and trunk strength to increase bathing and dressing tasks. OT Goal: Miscellaneous Goal #1 - Progress: Goal set today  Visit Information  Last OT Received On: 10/29/12 Assistance Needed: +1    Subjective Data  Subjective: "I'm ok." Patient Stated Goal: To go home.   Prior Functioning     Home Living Lives With: Spouse Available Help at Discharge: Family;Available 24 hours/day Type of Home: House Home Access: Level entry Home Layout: One level Bathroom Shower/Tub: Tub/shower unit;Curtain Firefighter: Standard Home Adaptive Equipment: None Prior Function Level of Independence: Independent Able to Take Stairs?: Yes Driving: No Vocation: Retired Musician: No difficulties Dominant Hand: Right            Cognition  Overall Cognitive Status: Appears within functional limits for tasks assessed/performed Arousal/Alertness: Awake/alert Orientation Level: Appears intact for tasks assessed Behavior During Session: [facility] for  tasks performed    Extremity/Trunk Assessment Right Upper Extremity Assessment RUE ROM/Strength/Tone: Deficits RUE ROM/Strength/Tone Deficits: A/ROM WFL in all ranges. MMT: 4-/5 RUE  Sensation: WFL - Light Touch;WFL - Proprioception RUE Coordination: WFL - gross/fine motor Left Upper Extremity Assessment LUE ROM/Strength/Tone: Deficits LUE ROM/Strength/Tone Deficits: A/ROM WFL in all ranges. MMT: 4-/5 LUE Sensation: WFL - Light Touch;WFL - Proprioception LUE Coordination: WFL - gross/fine motor     Mobility Transfers Sit to Stand: 5: Supervision;From bed;With upper extremity assist Stand to Sit: 5: Supervision;To bed;With upper extremity assist        Exercise General Exercises - Upper Extremity Shoulder Flexion: Strengthening;Both;10 reps;Supine;Theraband Theraband Level (Shoulder Flexion): Level 2 (Red) Shoulder Extension: Strengthening;Both;10 reps;Theraband;Supine Theraband Level (Shoulder Extension): Level 2 (Red) Shoulder Horizontal ABduction: Strengthening;Both;10 reps;Theraband;Supine Theraband Level (Shoulder Horizontal Abduction): Level 2 (Red) Shoulder Horizontal ADduction: Strengthening;Both;10 reps;Theraband;Supine Theraband Level (Shoulder Horizontal Adduction): Level 2 (Red) Elbow Flexion: Strengthening;Both;10 reps;Theraband;Supine Theraband Level (Elbow Flexion): Level 2 (Red) Elbow Extension: Strengthening;Both;10 reps;Theraband;Supine Theraband Level (Elbow Extension): Level 2 (Red) Other Exercises Other Exercises: Patient was given handout regarding Energy Conservation techniques. Reviewed handout with patient who verbalized understanding.      End of Session OT - End of Session Activity Tolerance: Patient limited by fatigue Patient left: in bed;with call bell/phone within reach   [facility], OTR/L 10/29/2012, 3:44 PM

## 2012-10-30 DIAGNOSIS — K529 Noninfective gastroenteritis and colitis, unspecified: Secondary | ICD-10-CM | POA: Diagnosis present

## 2012-10-30 DIAGNOSIS — E872 Acidosis, unspecified: Secondary | ICD-10-CM | POA: Diagnosis present

## 2012-10-30 LAB — CULTURE, BLOOD (ROUTINE X 2)
Culture: NO GROWTH
Culture: NO GROWTH

## 2012-10-30 LAB — STOOL CULTURE

## 2012-10-30 LAB — BLOOD GAS, ARTERIAL
Acid-base deficit: 9.1 mmol/L — ABNORMAL HIGH (ref 0.0–2.0)
pCO2 arterial: 26.2 mmHg — ABNORMAL LOW (ref 35.0–45.0)

## 2012-10-30 LAB — CBC
HCT: 31.9 % — ABNORMAL LOW (ref 39.0–52.0)
Hemoglobin: 11 g/dL — ABNORMAL LOW (ref 13.0–17.0)
MCHC: 34.5 g/dL (ref 30.0–36.0)
RBC: 3.35 MIL/uL — ABNORMAL LOW (ref 4.22–5.81)
WBC: 7.8 10*3/uL (ref 4.0–10.5)

## 2012-10-30 LAB — BASIC METABOLIC PANEL
BUN: 35 mg/dL — ABNORMAL HIGH (ref 6–23)
Chloride: 115 mEq/L — ABNORMAL HIGH (ref 96–112)
GFR calc Af Amer: 49 mL/min — ABNORMAL LOW (ref >90)
GFR calc non Af Amer: 42 mL/min — ABNORMAL LOW (ref >90)
Potassium: 4 mEq/L (ref 3.5–5.1)
Sodium: 142 mEq/L (ref 135–145)

## 2012-10-30 LAB — GLUCOSE, CAPILLARY
Glucose-Capillary: 137 mg/dL — ABNORMAL HIGH (ref 70–99)
Glucose-Capillary: 114 mg/dL — ABNORMAL HIGH (ref 70–99)
Glucose-Capillary: 130 mg/dL — ABNORMAL HIGH (ref 70–99)

## 2012-10-30 MED ORDER — METRONIDAZOLE IVPB CUSTOM
250.0000 mg | Freq: Three times a day (TID) | INTRAVENOUS | Status: AC
  Administered 2012-10-30 – 2012-10-31 (×3): 250 mg via INTRAVENOUS
  Filled 2012-10-30 (×3): qty 50

## 2012-10-30 MED ORDER — FUROSEMIDE 10 MG/ML IJ SOLN
40.0000 mg | Freq: Two times a day (BID) | INTRAMUSCULAR | Status: DC
  Administered 2012-10-30 – 2012-10-31 (×2): 40 mg via INTRAVENOUS
  Filled 2012-10-30 (×2): qty 4

## 2012-10-30 MED ORDER — ALBUTEROL SULFATE (5 MG/ML) 0.5% IN NEBU
2.5000 mg | INHALATION_SOLUTION | Freq: Two times a day (BID) | RESPIRATORY_TRACT | Status: DC
  Administered 2012-10-30 – 2012-11-07 (×16): 2.5 mg via RESPIRATORY_TRACT
  Filled 2012-10-30 (×16): qty 0.5

## 2012-10-30 MED ORDER — METRONIDAZOLE 50 MG/ML ORAL SUSPENSION
250.0000 mg | Freq: Three times a day (TID) | ORAL | Status: DC
  Administered 2012-10-31 – 2012-11-07 (×22): 250 mg
  Filled 2012-10-30 (×28): qty 5

## 2012-10-30 MED ORDER — SODIUM BICARBONATE 8.4 % IV SOLN
INTRAVENOUS | Status: DC
  Administered 2012-10-30: 14:00:00 via INTRAVENOUS
  Filled 2012-10-30 (×2): qty 1000

## 2012-10-30 MED ORDER — BISMUTH SUBSALICYLATE 262 MG/15ML PO SUSP
30.0000 mL | Freq: Four times a day (QID) | ORAL | Status: DC
  Administered 2012-10-30 – 2012-11-07 (×27): 30 mL
  Filled 2012-10-30 (×2): qty 236

## 2012-10-30 MED ORDER — METRONIDAZOLE 50 MG/ML ORAL SUSPENSION
250.0000 mg | Freq: Three times a day (TID) | ORAL | Status: DC
Start: 2012-10-30 — End: 2012-10-30
  Filled 2012-10-30 (×3): qty 5

## 2012-10-30 MED ORDER — SODIUM CHLORIDE 0.9 % IV SOLN
INTRAVENOUS | Status: DC
  Administered 2012-10-30 – 2012-10-31 (×2): via INTRAVENOUS

## 2012-10-30 NOTE — Progress Notes (Signed)
PT Cancellation Note  Patient Details Name: Eddie [name] MRN: [id number] DOB: 11-09-1946   Cancelled Treatment:    Reason Eval/Treat Not Completed: Fatigue/lethargy limiting ability to participate   Seth Bake, PTA 10/30/2012, 10:16 AM

## 2012-10-30 NOTE — Progress Notes (Signed)
Subjective: The patient reports no worsening abdominal pain or mouth pain. The Foley catheter was discontinued yesterday. The patient does not feel that he is retaining urine in his bladder. He urinated this morning with no difficulty.  Objective: Vital signs in last 24 hours: Filed Vitals:   10/29/12 2135 10/30/12 0629 10/30/12 0930 10/30/12 0933  BP: 100/61 104/72    Pulse: 87 88  89  Temp: 99.4 F (37.4 C) 98.1 F (36.7 C)    TempSrc: Oral Oral    Resp: 18 20  18   Height:      Weight:      SpO2: 99%  98% 99%    Intake/Output Summary (Last 24 hours) at 10/30/12 1337 Last data filed at 10/30/12 1300  Gross per 24 hour  Intake   2947 ml  Output    175 ml  Net   2772 ml    Weight change:  Physical exam: General: Cachectic-appearing debilitated 66 year old African-American man sitting up in bed, in no acute distress. Oral pharynx: Crusted over lesions on the lower lip and less erythematous. His tongue is less red, but no obvious white exudates. The silvery plaques, ulcers, or exudates underneath his tongue and on his buccal mucosa have subsided substantially.  Lungs: Clear anteriorly with decreased breath sounds in the bases. Heart: S1, S2, with no murmurs rubs or gallops. Abdomen: Hypoactive bowel sounds, PEG tube in place; mild excoriation around the site, but no purulent drainage. Mildly diffusely tender. No masses palpated. Extremities/musculoskeletal: Diffuse muscle atrophy. Pedal pulses barely palpable. Right upper extremity PICC in place. No surrounding erythema or edema or exudates.  Lab Results: Basic Metabolic Panel:  Basename 10/30/12 0838 10/29/12 0525  NA 142 141  K 4.0 4.1  CL 115* 116*  CO2 18* 18*  GLUCOSE 130* 137*  BUN 35* 24*  CREATININE 1.66* 1.39*  CALCIUM 8.1* 7.7*  MG -- --  PHOS -- --   Liver Function Tests:  [facility] 10/28/12 0622  AST 9  ALT 6  ALKPHOS 38*  BILITOT 0.7  PROT 4.7*  ALBUMIN 1.9*   No results found for this basename:  LIPASE:2,AMYLASE:2 in the last 72 hours No results found for this basename: AMMONIA:2 in the last 72 hours CBC:  Basename 10/30/12 0838 10/29/12 0525  WBC 7.8 6.0  NEUTROABS -- --  HGB 11.0* 10.7*  HCT 31.9* 31.7*  MCV 95.2 95.5  PLT 205 161   Cardiac Enzymes: No results found for this basename: CKTOTAL:3,CKMB:3,CKMBINDEX:3,TROPONINI:3 in the last 72 hours BNP: No results found for this basename: PROBNP:3 in the last 72 hours D-Dimer: No results found for this basename: DDIMER:2 in the last 72 hours CBG:  Basename 10/30/12 1148 10/30/12 0729 10/29/12 2236  GLUCAP 130* 114* 137*   Hemoglobin A1C: No results found for this basename: HGBA1C in the last 72 hours Fasting Lipid Panel: No results found for this basename: CHOL,HDL,LDLCALC,TRIG,CHOLHDL,LDLDIRECT in the last 72 hours Thyroid Function Tests: No results found for this basename: TSH,T4TOTAL,FREET4,T3FREE,THYROIDAB in the last 72 hours Anemia Panel: No results found for this basename: VITAMINB12,FOLATE,FERRITIN,TIBC,IRON,RETICCTPCT in the last 72 hours Coagulation: No results found for this basename: LABPROT:2,INR:2 in the last 72 hours Urine Drug Screen: Drugs of Abuse  No results found for this basename: labopia,  cocainscrnur,  labbenz,  amphetmu,  thcu,  labbarb    Alcohol Level: No results found for this basename: ETH:2 in the last 72 hours Urinalysis: No results found for this basename: COLORURINE:2,APPERANCEUR:2,LABSPEC:2,PHURINE:2,GLUCOSEU:2,HGBUR:2,BILIRUBINUR:2,KETONESUR:2,PROTEINUR:2,UROBILINOGEN:2,NITRITE:2,LEUKOCYTESUR:2 in the last 72 hours Misc. Labs:  Micro: Recent Results (from the past 240 hour(s))  URINE CULTURE     Status: Normal   Collection Time   10/24/12  8:48 PM      Component Value Range Status Comment   Specimen Description URINE, CATHETERIZED   Final    Special Requests NONE   Final    Culture  Setup Time 10/25/2012 14:19   Final    Colony Count NO GROWTH   Final    Culture NO  GROWTH   Final    Report Status 10/26/2012 FINAL   Final   CULTURE, BLOOD (ROUTINE X 2)     Status: Normal   Collection Time   10/25/12  1:52 AM      Component Value Range Status Comment   Specimen Description BLOOD LEFT ANTECUBITAL   Final    Special Requests     Final    Value: BOTTLES DRAWN AEROBIC AND ANAEROBIC 6CC  IMMUNE:COMPRM   Culture NO GROWTH 5 DAYS   Final    Report Status 10/30/2012 FINAL   Final   CULTURE, BLOOD (ROUTINE X 2)     Status: Normal   Collection Time   10/25/12  1:52 AM      Component Value Range Status Comment   Specimen Description BLOOD LEFT FOREARM   Final    Special Requests     Final    Value: BOTTLES DRAWN AEROBIC AND ANAEROBIC 6CC  IMMUNE:COMPRM   Culture NO GROWTH 5 DAYS   Final    Report Status 10/30/2012 FINAL   Final   STOOL CULTURE     Status: Normal (Preliminary result)   Collection Time   10/25/12 11:24 PM      Component Value Range Status Comment   Specimen Description PERIRECTAL   Final    Special Requests NONE   Final    Culture NO SUSPICIOUS COLONIES, CONTINUING TO HOLD   Final    Report Status PENDING   Incomplete   CLOSTRIDIUM DIFFICILE BY PCR     Status: Normal   Collection Time   10/25/12 11:25 PM      Component Value Range Status Comment   C difficile by pcr NEGATIVE  NEGATIVE Final   OVA AND PARASITE EXAMINATION     Status: Normal   Collection Time   10/25/12 11:25 PM      Component Value Range Status Comment   Specimen Description STOOL   Final    Special Requests NONE   Final    Ova and parasites NO OVA OR PARASITES SEEN MODERATE WBC SEEN   Final    Report Status 10/29/2012 FINAL   Final   CULTURE, BLOOD (ROUTINE X 2)     Status: Normal (Preliminary result)   Collection Time   10/26/12 10:52 AM      Component Value Range Status Comment   Specimen Description BLOOD LEFT ARM   Final    Special Requests BOTTLES DRAWN AEROBIC AND ANAEROBIC 8CC   Final    Culture NO GROWTH 4 DAYS   Final    Report Status PENDING    Incomplete   CULTURE, BLOOD (ROUTINE X 2)     Status: Normal (Preliminary result)   Collection Time   10/26/12 11:12 AM      Component Value Range Status Comment   Specimen Description BLOOD A-LINE DRAW DRAWN BY RN   Final    Special Requests BOTTLES DRAWN AEROBIC AND ANAEROBIC 6CC   Final    Culture NO GROWTH 4  DAYS   Final    Report Status PENDING   Incomplete   URINE CULTURE     Status: Normal   Collection Time   10/26/12  3:39 PM      Component Value Range Status Comment   Specimen Description URINE, RANDOM   Final    Special Requests NONE   Final    Culture  Setup Time 10/27/2012 05:34   Final    Colony Count NO GROWTH   Final    Culture NO GROWTH   Final    Report Status 10/28/2012 FINAL   Final   CLOSTRIDIUM DIFFICILE BY PCR     Status: Normal   Collection Time   10/29/12  2:00 AM      Component Value Range Status Comment   C difficile by pcr NEGATIVE  NEGATIVE Final     Studies/Results: No results found.  Medications:  Scheduled:    . albuterol  2.5 mg Nebulization BID  . ciprofloxacin  200 mg Intravenous Q12H  . enoxaparin (LOVENOX) injection  40 mg Subcutaneous Q24H  . fluconazole (DIFLUCAN) IV  100 mg Intravenous Q24H  . folic acid  1 mg Per Tube Daily  . free water  100 mL Per Tube Q8H  . insulin aspart  0-5 Units Subcutaneous QHS  . insulin aspart  0-9 Units Subcutaneous TID WC  . magic mouthwash  5 mL Oral TID PC & HS   And  . lidocaine  5 mL Mouth/Throat TID PC & HS  . LORazepam  0.25 mg Intravenous Q8H  . metronidazole  250 mg Intravenous Q8H  . multivitamin  5 mL Per Tube Daily  . ondansetron (ZOFRAN) IV  8 mg Intravenous Q8H  . pantoprazole sodium  40 mg Per Tube BID  . thiamine  100 mg Per Tube Daily  . vancomycin  750 mg Intravenous Q12H  . [DISCONTINUED] albuterol  2.5 mg Nebulization TID   Continuous:    . dextrose 5 % and 0.45% NaCl 1,000 mL with potassium chloride 20 mEq, sodium bicarbonate 50 mEq infusion    . feeding supplement  (JEVITY 1.2 CAL) 1,000 mL (10/29/12 2003)  . [DISCONTINUED] dextrose 5 % and 0.45 % NaCl with KCl 20 mEq/L 60 mL/hr at 10/30/12 1610   RUE:[id number], acetaminophen, albuterol, HYDROmorphone (DILAUDID) injection, LORazepam, ondansetron, polyethylene glycol, sodium chloride  Assessment: Principal Problem:  *Nausea and vomiting Active Problems:  Squamous cell carcinoma of esophagus  Thrush  Stomatitis and mucositis  Throat pain  Leukocytosis (leucocytosis)  Dehydration  Cachexia  Chemotherapy adverse reaction  Severe malnutrition  Fever  Abdominal pain, generalized  Duodenal ulcer  Helicobacter pylori (H. pylori) infection  Thrombocytopenia  Acute renal insufficiency  Hiccups  Hyperglycemia  Enteritis     1. Nausea and vomiting, likely multifactorial. See below.  Severe stomatitis and oral candidiasis  secondary to chemotherapy. The patient is still symptomatic, but overall less symptomatic on IV fluconazole, Magic mouthwash, and viscous lidocaine. Clinically, the stomatitis and mucositis appear to be resolving.  Fever. Etiology unknown at this time. He is afebrile now but continues to have episodic fevers. He has not had a fever so far today, which is the first time in the last 3-4 days. Followup chest x-ray reveals no acute abnormalities. His followup urinalysis is unremarkable. Blood cultures negative to date x4. Urine culture is negative. C. difficile toxin negative. Ova and parasite exam is negative. Stool culture negative. Lower extremity ultrasound negative for DVT. His PICC line was placed prior to  this hospitalization. The site does not appear to be infected, but this remains a potential source. He has some abdominal tenderness, but this can be attributable to his recent diagnosis of a duodenal ulcer. Malignancy causing fever is also a consideration. Broad-spectrum antibiotic treatment was started with Zosyn and vancomycin, but was discontinued when the CT scans revealed  only possible enteritis. He has known H. pylori positivity. Cipro and Flagyl were started and Zosyn was discontinued.  Acute renal insufficiency. The patient's creatinine has increased over the past couple days. IV fluids were titrated up. Free water between PEG feedings was added. His urine output recorded is questionable since the Foley catheter was discontinued yesterday. He denies urinary hesitancy.  Metabolic acidosis. Likely secondary to worsening acute renal failure versus bicarbonate losses from loose stools.  Duodenal ulcer with H. pylori positivity. This may be a source of his abdominal soreness. He is followed by GI; their assessment and recommendations noted and appreciated. We'll continue twice a day PPI and defer treatment for H. pylori per GI. He was given Zosyn for several days which probably helped with some treatment in part. Flagyl and Cipro were started which will likely cover H. pylori in part. Continue PEG tube feeding.  Thrombocytopenia. Likely secondary to history of chemotherapy. Now resolved.  Loose stools. C. difficile PCR negative.  Severe malnutrition/cachexia. Recommendations per the registered dietitian noted and appreciated. To feedings titrated up to 55 cc an hour.  Squamous cell carcinoma of the esophagus. Status post chemotherapy and radiation therapy. He is being followed by oncology. They will decide on ongoing course and followup recommendations.  Disposition: PT evaluated the patient recommended skilled nursing facility placement. The patient is in agreement with skilled nursing facility placement short-term for rehabilitation. This was discussed with his wife and sister-in-law yesterday. It is likely that the patient could be discharged to skilled nursing facility of choice later this week or when his renal function stabilizes. Arrangements will need to be made for ongoing radiation therapy per recommendations by oncology (while he is at the skilled nursing  facility)..    Plan: 1. Will check post void residual. If he retains more than 200 cc of urine, will reinsert Foley catheter. 2. [facility] consult nephrology. Will discontinue vancomycin as it a nephrotoxic medication and there is no evidence of Staphylococcus aureus infection. Will add bicarbonate to the IV fluids. 3. Consult urology if there is indication of urinary retention. We'll start Flomax empirically. 4. We'll start treatment for H. pylori while in the hospital to see if he can tolerate it.    LOS: 6 days   Rollyn Scialdone 10/30/2012, 1:37 PM

## 2012-10-30 NOTE — Progress Notes (Signed)
UR Chart Review Completed  

## 2012-10-30 NOTE — Consult Note (Signed)
Dictation#[id number]

## 2012-10-30 NOTE — Clinical Social Work Placement (Signed)
Clinical Social Work Department CLINICAL SOCIAL WORK PLACEMENT NOTE 10/30/2012  Patient:  Eddie [name], Eddie [name]  Account Number:  [id number] Admit date:  10/24/2012  Clinical Social Worker:  Derenda Fennel, LCSW  Date/time:  10/29/2012 09:20 AM  Clinical Social Work is seeking post-discharge placement for this patient at the following level of care:   SKILLED NURSING   (*CSW will update this form in Epic as items are completed)   10/29/2012  Patient/family provided with [facility] Department of Clinical Social Work's list of facilities offering this level of care within the geographic area requested by the patient (or if unable, by the patient's family).  10/29/2012  Patient/family informed of their freedom to choose among providers that offer the needed level of care, that participate in Medicare, Medicaid or managed care program needed by the patient, have an available bed and are willing to accept the patient.  10/29/2012  Patient/family informed of [facility]' ownership interest in [facility], as well as of the fact that they are under no obligation to receive care at this facility.  PASARR submitted to EDS on 10/29/2012 PASARR number received from EDS on 10/29/2012  FL2 transmitted to all facilities in geographic area requested by pt/family on  10/29/2012 FL2 transmitted to all facilities within larger geographic area on   Patient informed that his/her managed care company has contracts with or will negotiate with  certain facilities, including the following:     Patient/family informed of bed offers received:  10/30/2012 Patient chooses bed at [facility] [place] Physician recommends and patient chooses bed at  [facility] [place]  Patient to be transferred to  on   Patient to be transferred to facility by   The following physician request were entered in Epic:   Additional Comments:  Derenda Fennel, LCSW [phone]

## 2012-10-30 NOTE — Progress Notes (Signed)
Occupational Therapy Treatment Patient Details Name: Eddie [name] MRN: [id number] DOB: 1946/05/18 Today's Date: 10/30/2012 Time: 4259-5638 OT Time Calculation (min): 15 min  OT Assessment / Plan / Recommendation Comments on Treatment Session A:  Pt. very weak and lethargic.  Patient was not able to do any further ADL or ther-act task secondary to fatigue.    Follow Up Recommendations       Barriers to Discharge       Equipment Recommendations       Recommendations for Other Services    Frequency     Plan Discharge plan remains appropriate    Precautions / Restrictions Precautions Precautions: Fall Restrictions Weight Bearing Restrictions: No   Pertinent Vitals/Pain     ADL  Grooming: Performed;Wash/dry hands;Wash/dry face;Moderate assistance Where Assessed - Grooming: Supported sitting      OT Goals    Visit Information  Last OT Received On: 10/30/12 Assistance Needed: +1    Subjective Data  Subjective: S:  I'm really tired.   Prior Functioning       Cognition  Overall Cognitive Status: Appears within functional limits for tasks assessed/performed Arousal/Alertness: Lethargic Orientation Level: Appears intact for tasks assessed Behavior During Session: [facility] for tasks performed    Mobility  Shoulder Instructions         Exercises      Balance     End of Session OT - End of Session Equipment Utilized During Treatment: Gait belt Activity Tolerance: Patient limited by fatigue Patient left: in bed;with call bell/phone within reach;with bed alarm set  GO     Sharetta Ricchio L 10/30/2012, 2:24 PM

## 2012-10-30 NOTE — Progress Notes (Signed)
Subjective: Patient is seen laying in bed.    He reports that his mouth pain is improved, but remains.   He continues to complain of abdominal pain.  This may be secondary to peptic ulcer/H. Pylori infection.  Stable.   He denies any shaking chills.   I personally reviewed and went over radiographic studies with the patient.   Objective: Vital signs in last 24 hours: Temp:  [98.1 F (36.7 C)-99.4 F (37.4 C)] 98.1 F (36.7 C) (11/26 0629) Pulse Rate:  [87-93] 89  (11/26 0933) Resp:  [18-20] 18  (11/26 0933) BP: (100-120)/(61-78) 104/72 mmHg (11/26 0629) SpO2:  [96 %-99 %] 99 % (11/26 0933)  Intake/Output from previous day: 11/25 0800 - 11/26 0759 In: 2947 [P.O.:120; I.V.:1527; IV Piggyback:550] Out: 75 [Urine:75] Intake/Output this shift:    General appearance: alert, cooperative, appears older than stated age, cachectic, mild distress and edematous lips  Resp: clear to auscultation bilaterally Cardio: regular rate and rhythm, S1, S2 normal, no murmur, click, rub or gallop GI: soft, non-tender; bowel sounds normal; no masses,  no organomegaly Extremities: extremities normal, atraumatic, no cyanosis or edema  Lab Results:   [facility] 10/30/12 0838 10/29/12 0525  WBC 7.8 6.0  HGB 11.0* 10.7*  HCT 31.9* 31.7*  PLT 205 161   BMET  Basename 10/30/12 0838 10/29/12 0525  NA 142 141  K 4.0 4.1  CL 115* 116*  CO2 18* 18*  GLUCOSE 130* 137*  BUN 35* 24*  CREATININE 1.66* 1.39*  CALCIUM 8.1* 7.7*    Studies/Results: No results found.  Medications: I have reviewed the patient's current medications.  Assessment/Plan: 1. Stomatitis, improving 2. Oral candidiasis, on IV Diflucan  3. Abdominal pain, followed by GI 4. Nausea/vomiting, improved 5. Squamous cell carcinoma of esophagus, S/P 3 cycles of 5 FU 400 mg/m2/day (starting on 10/11/2012) and concomitant radiation by Dr. Thersa Salt. 5 FU stopped today at 830-845 AM and this completes his 3rd cycle. This cancer is  potentially curable. Interval regression of esophageal mass on CT of chest.  Regression is significant on review of CT. 6. Mild thrombocytopenia, reactive versus chemotherapy-induced. Resolved  7. Cachexia 8. Severe Malnutrition, on Jevity by GI. 9. Fevers, TMax24h 102.9.  On IV antibiotics.  No source identified at this time.  CT CAP unimpressive with regards to cause of fevers.  Negative [place] of LE for DVT. 10. Duodenal ulcer with H. Pylori positivity, will defer to GI. 11. Worsening renal function- this is not chemotherapy induced likely dehydration +/- Hypertension (on his problem list) related.  12. Strongly encourage re-initiation of chemoradiation when patient is discharged and recovered.  Patient will require daily transportation for radiation and weekly transportation to [facility] for pump changes.  Chemotherapy has been reduced by 50% and will be restarted with radiation when patient is fit for more therapy.   13. Please copy Oncology on discharge note and we can help facilitate arranging radiation and chemotherapy with rehabilitation facility.  Recommend [facility] if available.    Patient and plan discussed with Dr. Glenford Peers and he is in agreement with the aforementioned.  Patient seen and examined by Dr. Glenford Peers as well.      LOS: 6 days    KEFALAS,THOMAS 10/30/2012

## 2012-10-30 NOTE — Progress Notes (Signed)
Coude catheter placed per Dr. Jerre Simon. Some urine returned but not enough for urine samples.

## 2012-10-30 NOTE — Progress Notes (Signed)
ANTIBIOTIC CONSULT NOTE  Pharmacy Consult for Vancomycin and Zosyn Indication: rule out pneumonia  Allergies  Allergen Reactions  . Motrin (Ibuprofen) Swelling    Patient Measurements: Height: 5\' 7"  (170.2 cm) Weight: 105 lb (47.628 kg) IBW/kg (Calculated) : 66.1   Vital Signs: Temp: 98.1 F (36.7 C) (11/26 0629) Temp src: Oral (11/26 0629) BP: 104/72 mmHg (11/26 0629) Pulse Rate: 89  (11/26 0933) Intake/Output from previous day: 11/25 0701 - 11/26 0700 In: 2947 [P.O.:120; I.V.:1527; IV Piggyback:550] Out: 75 [Urine:75] Intake/Output from this shift:    Labs:  Basename 10/30/12 0838 10/29/12 0525 10/28/12 0622  WBC 7.8 6.0 5.8  HGB 11.0* 10.7* 10.9*  PLT 205 161 131*  LABCREA -- -- --  CREATININE 1.66* 1.39* 1.38*   Estimated Creatinine Clearance: 30.3 ml/min (by C-G formula based on Cr of 1.66).  Basename 10/28/12 1230  VANCOTROUGH 5.3*  VANCOPEAK --  Drue Dun --  GENTTROUGH --  GENTPEAK --  GENTRANDOM --  TOBRATROUGH --  TOBRAPEAK --  TOBRARND --  AMIKACINPEAK --  AMIKACINTROU --  AMIKACIN --     Microbiology: Recent Results (from the past 720 hour(s))  URINE CULTURE     Status: Normal   Collection Time   10/24/12  8:48 PM      Component Value Range Status Comment   Specimen Description URINE, CATHETERIZED   Final    Special Requests NONE   Final    Culture  Setup Time 10/25/2012 14:19   Final    Colony Count NO GROWTH   Final    Culture NO GROWTH   Final    Report Status 10/26/2012 FINAL   Final   CULTURE, BLOOD (ROUTINE X 2)     Status: Normal   Collection Time   10/25/12  1:52 AM      Component Value Range Status Comment   Specimen Description BLOOD LEFT ANTECUBITAL   Final    Special Requests     Final    Value: BOTTLES DRAWN AEROBIC AND ANAEROBIC 6CC  IMMUNE:COMPRM   Culture NO GROWTH 5 DAYS   Final    Report Status 10/30/2012 FINAL   Final   CULTURE, BLOOD (ROUTINE X 2)     Status: Normal   Collection Time   10/25/12  1:52 AM     Component Value Range Status Comment   Specimen Description BLOOD LEFT FOREARM   Final    Special Requests     Final    Value: BOTTLES DRAWN AEROBIC AND ANAEROBIC 6CC  IMMUNE:COMPRM   Culture NO GROWTH 5 DAYS   Final    Report Status 10/30/2012 FINAL   Final   STOOL CULTURE     Status: Normal (Preliminary result)   Collection Time   10/25/12 11:24 PM      Component Value Range Status Comment   Specimen Description PERIRECTAL   Final    Special Requests NONE   Final    Culture NO SUSPICIOUS COLONIES, CONTINUING TO HOLD   Final    Report Status PENDING   Incomplete   CLOSTRIDIUM DIFFICILE BY PCR     Status: Normal   Collection Time   10/25/12 11:25 PM      Component Value Range Status Comment   C difficile by pcr NEGATIVE  NEGATIVE Final   OVA AND PARASITE EXAMINATION     Status: Normal   Collection Time   10/25/12 11:25 PM      Component Value Range Status Comment   Specimen Description STOOL  Final    Special Requests NONE   Final    Ova and parasites NO OVA OR PARASITES SEEN MODERATE WBC SEEN   Final    Report Status 10/29/2012 FINAL   Final   CULTURE, BLOOD (ROUTINE X 2)     Status: Normal (Preliminary result)   Collection Time   10/26/12 10:52 AM      Component Value Range Status Comment   Specimen Description BLOOD LEFT ARM   Final    Special Requests BOTTLES DRAWN AEROBIC AND ANAEROBIC 8CC   Final    Culture NO GROWTH 4 DAYS   Final    Report Status PENDING   Incomplete   CULTURE, BLOOD (ROUTINE X 2)     Status: Normal (Preliminary result)   Collection Time   10/26/12 11:12 AM      Component Value Range Status Comment   Specimen Description BLOOD A-LINE DRAW DRAWN BY RN   Final    Special Requests BOTTLES DRAWN AEROBIC AND ANAEROBIC 6CC   Final    Culture NO GROWTH 4 DAYS   Final    Report Status PENDING   Incomplete   URINE CULTURE     Status: Normal   Collection Time   10/26/12  3:39 PM      Component Value Range Status Comment   Specimen Description URINE,  RANDOM   Final    Special Requests NONE   Final    Culture  Setup Time 10/27/2012 05:34   Final    Colony Count NO GROWTH   Final    Culture NO GROWTH   Final    Report Status 10/28/2012 FINAL   Final   CLOSTRIDIUM DIFFICILE BY PCR     Status: Normal   Collection Time   10/29/12  2:00 AM      Component Value Range Status Comment   C difficile by pcr NEGATIVE  NEGATIVE Final     Medical History: Past Medical History  Diagnosis Date  . Hypertension   . Chronic back pain   . GERD (gastroesophageal reflux disease)   . Squamous cell carcinoma Oct 2014    Esophogus  . Squamous cell carcinoma of esophagus 09/25/2012  . Severe malnutrition 10/26/2012  . Duodenal ulcer 09/2012  . Helicobacter pylori (H. pylori) infection 09/2012    Medications:  Scheduled:     . albuterol  2.5 mg Nebulization BID  . ciprofloxacin  200 mg Intravenous Q12H  . enoxaparin (LOVENOX) injection  40 mg Subcutaneous Q24H  . fluconazole (DIFLUCAN) IV  100 mg Intravenous Q24H  . folic acid  1 mg Per Tube Daily  . free water  100 mL Per Tube Q8H  . insulin aspart  0-5 Units Subcutaneous QHS  . insulin aspart  0-9 Units Subcutaneous TID WC  . magic mouthwash  5 mL Oral TID PC & HS   And  . lidocaine  5 mL Mouth/Throat TID PC & HS  . LORazepam  0.25 mg Intravenous Q8H  . metronidazole  250 mg Intravenous Q8H  . multivitamin  5 mL Per Tube Daily  . ondansetron (ZOFRAN) IV  8 mg Intravenous Q8H  . pantoprazole sodium  40 mg Per Tube BID  . thiamine  100 mg Per Tube Daily  . vancomycin  750 mg Intravenous Q12H  . [DISCONTINUED] albuterol  2.5 mg Nebulization TID   Assessment: 66yo AA male with h/o squamous cell carcinoma of the esophagus.  SCr has bumped up since admission and  Estimated Creatinine  Clearance: 30.3 ml/min (by C-G formula based on Cr of 1.66).   Trough below goal when checked on 11/24  Goal of Therapy:  Vancomycin trough level 15-20 mcg/ml  Plan: Continue Zosyn 3.375gm iv q8hrs (4hr  infusion) Vancomycin to 750mg  IV every 12 hours (adjusted 11/24) Repeat trough tomorrow since SCr has bumped up Monitor labs, renal fxn, and cultures per protocol Duration of therapy per MD  Valrie Hart A 10/30/2012,10:25 AM

## 2012-10-30 NOTE — Clinical Social Work Note (Signed)
CSW presented bed offer to pt of [facility]. He is agreeable. Pt's sister-in-law was hopeful pt could be placed in [facility] as he has radiation there. She is aware that there are no beds available currently. [facility] aware of continuous chemo, PEG tube, and self suctioning. Awaiting stability for d/c.   Derenda Fennel, LCSW [phone]

## 2012-10-30 NOTE — Progress Notes (Signed)
Patient voiding very small amount at a time. Post void residual was >240 per bladder scan. MD order to place foley but unable to place foley. Dr. Sherrie Mustache notified. She ordered a consult with urology.

## 2012-10-30 NOTE — Progress Notes (Signed)
Dr.Fisher notified of gastric residual greater than 100cc. MD stated to continue to monitor. Patient reporting conflicting remarks regarding feelings of nausea/vomiting.

## 2012-10-30 NOTE — Progress Notes (Signed)
Nutrition Follow-up  Intervention:   No New recommendations at this time  Assessment:   Pt EN regimen:Continuous Jevity 1.2 @ 55 ml/hr via PEG-jejunostomy. Flushes free water added 100 ml TID. Last BM 11/25. Afebrile today. New-wt obtained. He has severe wt decrease of 8%,8.6# (3.9 kg) x 3 days. Urology consult pending. Labs reviewed.  Current TF rate providing: 1584 kcal (36kcal/kg), 73 gr protein (1.6 gr/kg), 1065 ml water from formula. Adequate to meet >90% current estimated needs.  IVF-D5 1/2 NS @70  ml/hr provides~285 kcal/day   Meds: Scheduled Meds:   . albuterol  2.5 mg Nebulization BID  . ciprofloxacin  200 mg Intravenous Q12H  . enoxaparin (LOVENOX) injection  40 mg Subcutaneous Q24H  . fluconazole (DIFLUCAN) IV  100 mg Intravenous Q24H  . folic acid  1 mg Per Tube Daily  . free water  100 mL Per Tube Q8H  . insulin aspart  0-5 Units Subcutaneous QHS  . insulin aspart  0-9 Units Subcutaneous TID WC  . magic mouthwash  5 mL Oral TID PC & HS   And  . lidocaine  5 mL Mouth/Throat TID PC & HS  . LORazepam  0.25 mg Intravenous Q8H  . metronidazole  250 mg Intravenous Q8H  . multivitamin  5 mL Per Tube Daily  . ondansetron (ZOFRAN) IV  8 mg Intravenous Q8H  . pantoprazole sodium  40 mg Per Tube BID  . thiamine  100 mg Per Tube Daily  . [DISCONTINUED] albuterol  2.5 mg Nebulization TID  . [DISCONTINUED] vancomycin  750 mg Intravenous Q12H   Continuous Infusions:   . dextrose 5 % and 0.45% NaCl 1,000 mL with potassium chloride 20 mEq, sodium bicarbonate 50 mEq infusion 70 mL/hr at 10/30/12 1405  . feeding supplement (JEVITY 1.2 CAL) 1,000 mL (10/29/12 2003)  . [DISCONTINUED] dextrose 5 % and 0.45 % NaCl with KCl 20 mEq/L Stopped (10/30/12 1406)   PRN Meds:.acetaminophen, acetaminophen, albuterol, HYDROmorphone (DILAUDID) injection, LORazepam, ondansetron, polyethylene glycol, sodium chloride   CMP     Component Value Date/Time   NA 142 10/30/2012 0838   K 4.0 10/30/2012  0838   CL 115* 10/30/2012 0838   CO2 18* 10/30/2012 0838   GLUCOSE 130* 10/30/2012 0838   BUN 35* 10/30/2012 0838   CREATININE 1.66* 10/30/2012 0838   CREATININE 1.12 07/30/2012 0950   CALCIUM 8.1* 10/30/2012 0838   PROT 4.7* 10/28/2012 0622   ALBUMIN 1.9* 10/28/2012 0622   AST 9 10/28/2012 0622   ALT 6 10/28/2012 0622   ALKPHOS 38* 10/28/2012 0622   BILITOT 0.7 10/28/2012 0622   GFRNONAA 42* 10/30/2012 0838   GFRAA 49* 10/30/2012 0838    CBG (last 3)   Basename 10/30/12 1148 10/30/12 0729 10/29/12 2236  GLUCAP 130* 114* 137*     Intake/Output Summary (Last 24 hours) at 10/30/12 1444 Last data filed at 10/30/12 1300  Gross per 24 hour  Intake   2947 ml  Output    175 ml  Net   2772 ml    Weight Status: 96.4#  (43.7kg) reflects severe wt loss of 8%,8.6# (3.9 kg) x 3 days  Re-estimated needs: 1504-1739 kcal, 75-94 gr protein daily   NUTRITION DIAGNOSIS:  -Inadequate oral intake (NI-2.1). Status: Ongoing   RELATED TO: esophageal carcinoma   AS EVIDENCE BY: Non-volitional wt loss hx, NPO /Clear Liquids status, increased nutrition needs with catabolic illness   [phone]

## 2012-10-30 NOTE — Consult Note (Signed)
Reason for Consult: Acute kidney injury Referring Physician: Dr. Marcha Dutton is an 66 y.o. male.  HPI: Patient with  history of hypertension,, locally advanced  esophageal is, squamous cell cancer who is on radiation and chemotherapy presently had came with weakness, oral mucosa ulceration and difficulty in swallowing. This is associated with nausea vomiting. According to the patient he has received about 7 does of chemotherapy and he stopped it last Tuesday because of the above-mentioned problem. Presently is still accomplice of difficulty swallowing, he has some nausea and also vomiting. He has soreness of his mouth. Patient is still that still is not feeling better. Patient however denies any history of kidney stone no previous history of renal failure. Presently he states that he is making some urine but just a small amount.  Past Medical History  Diagnosis Date  . Hypertension   . Chronic back pain   . GERD (gastroesophageal reflux disease)   . Squamous cell carcinoma Oct 2014    Esophogus  . Squamous cell carcinoma of esophagus 09/25/2012  . Severe malnutrition 10/26/2012  . Duodenal ulcer 09/2012  . Helicobacter pylori (H. pylori) infection 09/2012    Past Surgical History  Procedure Date  . Peg tube placement 09/21/2012  . Esophagogastroduodenoscopy 09/21/2012    Procedure: ESOPHAGOGASTRODUODENOSCOPY (EGD);  Surgeon: Malissa Hippo, MD;  Location: [facility];  Service: Endoscopy;  Laterality: N/A;  345  . Peg placement 09/21/2012    Procedure: PERCUTANEOUS ENDOSCOPIC GASTROSTOMY (PEG) PLACEMENT;  Surgeon: Malissa Hippo, MD;  Location: [facility];  Service: Endoscopy;  Laterality: N/A;    Family History  Problem Relation Age of Onset  . Stroke Mother   . Hypertension Father   . Cancer Brother     Throat cancer  . Kidney disease Daughter     Social History:  reports that he has quit smoking. He has never used smokeless tobacco. He reports that he does not  drink alcohol or use illicit drugs.  Allergies:  Allergies  Allergen Reactions  . Motrin (Ibuprofen) Swelling    Medications: I have reviewed the patient's current medications.  Results for orders placed during the hospital encounter of 10/24/12 (from the past 48 hour(s))  CLOSTRIDIUM DIFFICILE BY PCR     Status: Normal   Collection Time   10/29/12  2:00 AM      Component Value Range Comment   C difficile by pcr NEGATIVE  NEGATIVE   BASIC METABOLIC PANEL     Status: Abnormal   Collection Time   10/29/12  5:25 AM      Component Value Range Comment   Sodium 141  135 - 145 mEq/L    Potassium 4.1  3.5 - 5.1 mEq/L    Chloride 116 (*) 96 - 112 mEq/L    CO2 18 (*) 19 - 32 mEq/L    Glucose, Bld 137 (*) 70 - 99 mg/dL    BUN 24 (*) 6 - 23 mg/dL    Creatinine, Ser 1.61 (*) 0.50 - 1.35 mg/dL    Calcium 7.7 (*) 8.4 - 10.5 mg/dL    GFR calc non Af Amer 52 (*) >90 mL/min    GFR calc Af Amer 60 (*) >90 mL/min   CBC     Status: Abnormal   Collection Time   10/29/12  5:25 AM      Component Value Range Comment   WBC 6.0  4.0 - 10.5 K/uL    RBC 3.32 (*) 4.22 -  5.81 MIL/uL    Hemoglobin 10.7 (*) 13.0 - 17.0 g/dL    HCT 62.1 (*) 30.8 - 52.0 %    MCV 95.5  78.0 - 100.0 fL    MCH 32.2  26.0 - 34.0 pg    MCHC 33.8  30.0 - 36.0 g/dL    RDW 65.7  84.6 - 96.2 %    Platelets 161  150 - 400 K/uL   GLUCOSE, CAPILLARY     Status: Abnormal   Collection Time   10/29/12 10:36 PM      Component Value Range Comment   Glucose-Capillary 137 (*) 70 - 99 mg/dL   GLUCOSE, CAPILLARY     Status: Abnormal   Collection Time   10/30/12  7:29 AM      Component Value Range Comment   Glucose-Capillary 114 (*) 70 - 99 mg/dL   BASIC METABOLIC PANEL     Status: Abnormal   Collection Time   10/30/12  8:38 AM      Component Value Range Comment   Sodium 142  135 - 145 mEq/L    Potassium 4.0  3.5 - 5.1 mEq/L    Chloride 115 (*) 96 - 112 mEq/L    CO2 18 (*) 19 - 32 mEq/L    Glucose, Bld 130 (*) 70 - 99 mg/dL     BUN 35 (*) 6 - 23 mg/dL    Creatinine, Ser 9.52 (*) 0.50 - 1.35 mg/dL    Calcium 8.1 (*) 8.4 - 10.5 mg/dL    GFR calc non Af Amer 42 (*) >90 mL/min    GFR calc Af Amer 49 (*) >90 mL/min   CBC     Status: Abnormal   Collection Time   10/30/12  8:38 AM      Component Value Range Comment   WBC 7.8  4.0 - 10.5 K/uL    RBC 3.35 (*) 4.22 - 5.81 MIL/uL    Hemoglobin 11.0 (*) 13.0 - 17.0 g/dL    HCT 84.1 (*) 32.4 - 52.0 %    MCV 95.2  78.0 - 100.0 fL    MCH 32.8  26.0 - 34.0 pg    MCHC 34.5  30.0 - 36.0 g/dL    RDW 40.1  02.7 - 25.3 %    Platelets 205  150 - 400 K/uL   GLUCOSE, CAPILLARY     Status: Abnormal   Collection Time   10/30/12 11:48 AM      Component Value Range Comment   Glucose-Capillary 130 (*) 70 - 99 mg/dL     No results found.  Review of Systems  Constitutional: Positive for weight loss and malaise/fatigue.  HENT: Positive for sore throat.   Cardiovascular: Negative for orthopnea, leg swelling and PND.  Gastrointestinal: Positive for heartburn, nausea, vomiting and abdominal pain. Negative for constipation and blood in stool.  Genitourinary: Negative for dysuria and urgency.  Musculoskeletal: Positive for back pain.  Neurological: Positive for weakness.   Blood pressure 117/70, pulse 84, temperature 98.8 F (37.1 C), temperature source Oral, resp. rate 18, height 5\' 7"  (1.702 m), weight 43.8 kg (96 lb 9 oz), SpO2 100.00%. Physical Exam  Constitutional: He is oriented to person, place, and time.       Patient is emaciated with poor skin turgor.  HENT:       Erythema of his lips and oral mucosa Sunken eyes and prominent zygomatic bone  Neck: No JVD present.       Submandibular hardened area but no  tenderness.  Cardiovascular: Normal rate, regular rhythm and normal heart sounds.   Respiratory: No respiratory distress. He has wheezes. He has no rales.  GI: Bowel sounds are normal. There is no tenderness. There is no rebound and no guarding.       Scaphoid with  feeding catheter.  Musculoskeletal: He exhibits no edema.  Neurological: He is alert and oriented to person, place, and time.  Skin: Skin is dry. No erythema.    Assessment/Plan: Problem #1 acute kidney injury possibly a combination of prerenal syndrome and chemotherapy such as 5-FU. Presently patient is not getting any chemotherapy. Problem #2 low CO2 most likely metabolic acidosis however at this moment there is no ABG hence it is difficult to rule out respiratory alkalosis with metabolic response. Problem #2 hypertension his blood pressure seems to be stable Problem #4 history of squamous cell cancer of the esophagus. Problem #5 malnutrition presently he is eating chewable Problem #6 history of esophagitis most likely the side effect of chemotherapy versus candidiasis. Problem #7 history of duodenal ulcer Plan: 1] we'll check his ABG 2] we'll increase his IV fluid to 125 cc per hour 3] we'll start him on Lasix 40 mg IV twice a day to improve his urine output. 4] we'll ask radiology if they can visualize the kidneys was a previous CT scan of the abdomen. 5] we'll check fractional excretion of sodium. 6] we'll check his basic metabolic panel, 25 vitamin D level and phosphorus.  Darcie Mellone S 10/30/2012, 3:40 PM

## 2012-10-31 ENCOUNTER — Inpatient Hospital Stay (HOSPITAL_COMMUNITY): Payer: Self-pay

## 2012-10-31 DIAGNOSIS — R109 Unspecified abdominal pain: Secondary | ICD-10-CM

## 2012-10-31 DIAGNOSIS — R1084 Generalized abdominal pain: Secondary | ICD-10-CM

## 2012-10-31 DIAGNOSIS — E43 Unspecified severe protein-calorie malnutrition: Secondary | ICD-10-CM

## 2012-10-31 DIAGNOSIS — E860 Dehydration: Secondary | ICD-10-CM

## 2012-10-31 DIAGNOSIS — C159 Malignant neoplasm of esophagus, unspecified: Secondary | ICD-10-CM

## 2012-10-31 DIAGNOSIS — N289 Disorder of kidney and ureter, unspecified: Secondary | ICD-10-CM

## 2012-10-31 LAB — BASIC METABOLIC PANEL
BUN: 45 mg/dL — ABNORMAL HIGH (ref 6–23)
CO2: 15 mEq/L — ABNORMAL LOW (ref 19–32)
Calcium: 8.1 mg/dL — ABNORMAL LOW (ref 8.4–10.5)
Glucose, Bld: 143 mg/dL — ABNORMAL HIGH (ref 70–99)
Potassium: 4 mEq/L (ref 3.5–5.1)
Sodium: 147 mEq/L — ABNORMAL HIGH (ref 135–145)

## 2012-10-31 LAB — CULTURE, BLOOD (ROUTINE X 2)
Culture: NO GROWTH
Culture: NO GROWTH

## 2012-10-31 LAB — GLUCOSE, CAPILLARY

## 2012-10-31 MED ORDER — DEXTROSE 5 % IV SOLN
Freq: Once | INTRAVENOUS | Status: AC
  Administered 2012-10-31: 18:00:00 via INTRAVENOUS
  Filled 2012-10-31: qty 1000

## 2012-10-31 MED ORDER — DEXTROSE 5 % IV SOLN: INTRAVENOUS | Status: DC

## 2012-10-31 MED ORDER — SODIUM CHLORIDE 0.45 % IV SOLN
INTRAVENOUS | Status: DC
  Administered 2012-11-01: 02:00:00 via INTRAVENOUS
  Filled 2012-10-31 (×7): qty 1000

## 2012-10-31 MED ORDER — ENOXAPARIN SODIUM 30 MG/0.3ML ~~LOC~~ SOLN
30.0000 mg | SUBCUTANEOUS | Status: DC
  Administered 2012-11-01 – 2012-11-07 (×7): 30 mg via SUBCUTANEOUS
  Filled 2012-10-31 (×7): qty 0.3

## 2012-10-31 MED ORDER — SODIUM CHLORIDE 0.45 % IV SOLN: INTRAVENOUS | Status: DC

## 2012-10-31 NOTE — Consult Note (Signed)
[name], [name]                   ACCOUNT NO.:  [id number]  MEDICAL RECORD NO.:  [id number]  LOCATION:  A315                          FACILITY:  [facility]  PHYSICIAN:  Ky Barban, M.D.DATE OF BIRTH:  05-Sep-1946  DATE OF CONSULTATION:  10/30/2012 DATE OF DISCHARGE:                                CONSULTATION   CHIEF COMPLAINT:  Difficulty to void.  HISTORY:  A 67 year old gentleman who primarily has cancer of the esophagus who has been started on radiation and chemotherapy, but it was discontinued because of the side effects.  He is having lot of difficulty voiding, and I was asked to come and see him and put a Foley catheter.  His creatinine is slightly elevated.  He developed acute renal failure and the nephrologist, Dr. Kristian [name] is seeing him also.  He has put him on increased IV fluids with Lasix.  I put a Foley catheter #16 with a Coude tip, and I have got maybe less than 25 mL of urine.  I think he needs IV fluids, and then I will re-evaluate him.  He has a history of having hypertension.  He also has advanced cancer of the esophagus with squamous cell cancer, and had radiotherapy and chemotherapy which has been discontinued because of the side effects. The patient has lot of considerable amount of weight and he ended up having  percutaneous gastrostomy tube for feeding.  The patient is not able to eat because of soreness of his mouth.  PAST MEDICAL HISTORY: 1. Hypertension. 2. Chronic back pain. 3. Gastroesophageal reflux. 4. Squamous cell carcinoma of the esophagus. 5. Severe malnutrition. 6. Duodenal ulcer.  PROCEDURES:  PEG tube placement September 21, 2012, last month.  Upper GI endoscope on September 21, 2012, last month..  PERSONAL HISTORY:  He has quit smoking, does not drink or use illicit drugs.  REVIEW OF SYSTEMS:  Denies any chest pain, orthopnea, PND, nausea, vomiting.  PHYSICAL EXAMINATION:  GENERAL:  Weakly built male, fully conscious, alert,  oriented, not in acute distress.  Lying quietly in the bed.  He does complain of heartburn, nausea, vomiting, and abdominal pain. Negative constipation or blood in the stools. VITAL SIGNS:  Blood pressure is 117/70, pulse 84 per minute. ABDOMEN:  Soft, flat.  Liver, spleen, kidneys not palpable.  Bladder is not distended. GU:  External genitalia is uncircumcised.  Meatus adequate.  Testicles are normal. RECTAL:  Normal sphincter tone.  No rectal mass.  Prostate 1+ smooth and firm.  IMPRESSION:  Possible benign prostatic hyperplasia, acute renal failure, cancer of the esophagus, side effects from chemotherapy and radiotherapy, and also hypertension.  PLAN:  Foley catheter drainage.  His IV fluid is increased.  His BMET, sodium is 142, potassium 4, chloride 115, CO2 is 18, BUN is 35 creatinine 1.66.  WBC 6000, hematocrit 31.7.  Stool shows C. diff negative.  Recommend continue IV fluids, monitor his urine output.  Once his BUN and creatinine becomes better, we will take the catheter out and give him a voiding trial.  If he still continued to be symptomatic, we will check his residual urine and decide about doing cystoscopy, but I am going to  wait and not do that at this point.  I appreciate Dr. Elliot [name] for letting me to see this patient.     Ky Barban, M.D.     MIJ/MEDQ  D:  10/30/2012  T:  10/31/2012  Job:  [id number]

## 2012-10-31 NOTE — Progress Notes (Signed)
Nutrition Follow-up  Intervention:   No New recommendations at this time  Assessment:   Pt EN regimen:Continuous Jevity 1.2 @ 55 ml/hr via PEG-jejunostomy. Flushes free water added 100 ml TID. IVF increased and D5 d/c. Lasix added. Last BM 11/27. Afebrile. Labs, I/O's reviewed.   Current TF rate providing: 1584 kcal (36kcal/kg), 73 gr protein (1.6 gr/kg), 1065 ml water from formula. Adequate to meet >90% current estimated needs.  IVF-NS @125  ml/hr   Meds: Scheduled Meds:    . albuterol  2.5 mg Nebulization BID  . bismuth subsalicylate  30 mL Per Tube QID  . ciprofloxacin  200 mg Intravenous Q12H  . enoxaparin (LOVENOX) injection  30 mg Subcutaneous Q24H  . fluconazole (DIFLUCAN) IV  100 mg Intravenous Q24H  . folic acid  1 mg Per Tube Daily  . free water  100 mL Per Tube Q8H  . furosemide  40 mg Intravenous BID  . insulin aspart  0-5 Units Subcutaneous QHS  . insulin aspart  0-9 Units Subcutaneous TID WC  . magic mouthwash  5 mL Oral TID PC & HS   And  . lidocaine  5 mL Mouth/Throat TID PC & HS  . LORazepam  0.25 mg Intravenous Q8H  . metroNIDAZOLE  250 mg Per Tube TID  . [COMPLETED] metronidazole  250 mg Intravenous Q8H  . multivitamin  5 mL Per Tube Daily  . ondansetron (ZOFRAN) IV  8 mg Intravenous Q8H  . pantoprazole sodium  40 mg Per Tube BID  . thiamine  100 mg Per Tube Daily  . [DISCONTINUED] enoxaparin (LOVENOX) injection  40 mg Subcutaneous Q24H  . [DISCONTINUED] metroNIDAZOLE  250 mg Per Tube TID  . [DISCONTINUED] metronidazole  250 mg Intravenous Q8H  . [DISCONTINUED] vancomycin  750 mg Intravenous Q12H   Continuous Infusions:    . sodium chloride 125 mL/hr at 10/31/12 0507  . feeding supplement (JEVITY 1.2 CAL) 1,000 mL (10/30/12 1648)  . [DISCONTINUED] dextrose 5 % and 0.45 % NaCl with KCl 20 mEq/L Stopped (10/30/12 1406)  . [DISCONTINUED] dextrose 5 % and 0.45% NaCl 1,000 mL with potassium chloride 20 mEq, sodium bicarbonate 50 mEq infusion 70 mL/hr at  10/30/12 1405   PRN Meds:.acetaminophen, acetaminophen, albuterol, HYDROmorphone (DILAUDID) injection, LORazepam, ondansetron, polyethylene glycol, sodium chloride   CMP     Component Value Date/Time   NA 147* 10/31/2012 0521   K 4.0 10/31/2012 0521   CL 121* 10/31/2012 0521   CO2 15* 10/31/2012 0521   GLUCOSE 143* 10/31/2012 0521   BUN 45* 10/31/2012 0521   CREATININE 2.14* 10/31/2012 0521   CREATININE 1.12 07/30/2012 0950   CALCIUM 8.1* 10/31/2012 0521   PROT 4.7* 10/28/2012 0622   ALBUMIN 1.9* 10/28/2012 0622   AST 9 10/28/2012 0622   ALT 6 10/28/2012 0622   ALKPHOS 38* 10/28/2012 0622   BILITOT 0.7 10/28/2012 0622   GFRNONAA 31* 10/31/2012 0521   GFRAA 36* 10/31/2012 0521    CBG (last 3)   Basename 10/31/12 1117 10/31/12 0724 10/30/12 2105  GLUCAP 134* 121* 136*     Intake/Output Summary (Last 24 hours) at 10/31/12 1131 Last data filed at 10/31/12 0546  Gross per 24 hour  Intake 1142.42 ml  Output    600 ml  Net 542.42 ml    Weight Status: 96.4#  (43.7kg) reflects severe wt loss of 8%,8.6# (3.9 kg) x 3 days  Re-estimated needs: 1504-1739 kcal, 75-94 gr protein daily   NUTRITION DIAGNOSIS:  -Inadequate oral intake (NI-2.1). Status: Ongoing  RELATED TO: esophageal carcinoma   AS EVIDENCE BY: Non-volitional wt loss hx, NPO /Clear Liquids status, increased nutrition needs with catabolic illness   [phone]

## 2012-10-31 NOTE — Progress Notes (Signed)
Eddie [name]  MRN: [id number]  DOB/AGE: 64/11/1946 64 y.o.  Primary Care Physician:Bylas, Kingsley Spittle, MD  Admit date: 10/24/2012  Chief Complaint:  Chief Complaint  Patient presents with  . Weakness    S-Pt presented on  10/24/2012 with  Chief Complaint  Patient presents with  . Weakness  .    Pt today feels better.Pt offers no new complaints .    Meds    . albuterol  2.5 mg Nebulization BID  . bismuth subsalicylate  30 mL Per Tube QID  . ciprofloxacin  200 mg Intravenous Q12H  . enoxaparin (LOVENOX) injection  40 mg Subcutaneous Q24H  . fluconazole (DIFLUCAN) IV  100 mg Intravenous Q24H  . folic acid  1 mg Per Tube Daily  . free water  100 mL Per Tube Q8H  . furosemide  40 mg Intravenous BID  . insulin aspart  0-5 Units Subcutaneous QHS  . insulin aspart  0-9 Units Subcutaneous TID WC  . magic mouthwash  5 mL Oral TID PC & HS   And  . lidocaine  5 mL Mouth/Throat TID PC & HS  . LORazepam  0.25 mg Intravenous Q8H  . metroNIDAZOLE  250 mg Per Tube TID  . metronidazole  250 mg Intravenous Q8H  . multivitamin  5 mL Per Tube Daily  . ondansetron (ZOFRAN) IV  8 mg Intravenous Q8H  . pantoprazole sodium  40 mg Per Tube BID  . thiamine  100 mg Per Tube Daily  . [DISCONTINUED] albuterol  2.5 mg Nebulization TID  . [DISCONTINUED] metroNIDAZOLE  250 mg Per Tube TID  . [DISCONTINUED] metronidazole  250 mg Intravenous Q8H  . [DISCONTINUED] vancomycin  750 mg Intravenous Q12H      Physical Exam: Vital signs in last 24 hours: Temp:  [97.4 F (36.3 C)-98.8 F (37.1 C)] 97.5 F (36.4 C) (11/27 0536) Pulse Rate:  [73-109] 109  (11/27 0536) Resp:  [18] 18  (11/27 0536) BP: (107-117)/(70-78) 110/74 mmHg (11/27 0536) SpO2:  [94 %-100 %] 99 % (11/27 0739) Weight:  [96 lb 9 oz (43.8 kg)] 96 lb 9 oz (43.8 kg) (11/26 1448) Weight change:  Last BM Date: 10/30/12  Intake/Output from previous day: 11/26 0701 - 11/27 0700 In: 1492.4 [I.V.:182.4; IV Piggyback:550] Out: 600  [Urine:600]     Physical Exam: General: Cachectic,,no acute distress.  Resp: NO distress,Decreased breath sounds in the bases.  CVSt: S1, S2, with no murmurs rubs or gallops.  GIT:  bowel sounds decresed, PEG tube in situ;soft  Extremities: Diffuse muscle atrophy.NO edema     Lab Results: CBC  Basename 10/30/12 0838 10/29/12 0525  WBC 7.8 6.0  HGB 11.0* 10.7*  HCT 31.9* 31.7*  PLT 205 161    BMET  Basename 10/31/12 0521 10/30/12 0838  NA 147* 142  K 4.0 4.0  CL 121* 115*  CO2 15* 18*  GLUCOSE 143* 130*  BUN 45* 35*  CREATININE 2.14* 1.66*  CALCIUM 8.1* 8.1*   Trend  Creat 2013 1.2==>1.38==>1.66==>2.14 AKI during last admission in October as well   Anion Gap 147-136=9  Albumin 1.9  Delta 9-6=3  Delta BIcarb 24-15=9   Results for ARTHA, VONCANNON (MRN [id number]) as of 10/31/2012 08:55  Ref. Range 10/30/2012 16:10  pH, Arterial Latest Range: 7.350-7.450  7.363  pCO2 arterial Latest Range: 35.0-45.0 mmHg 26.2 (L)  pO2, Arterial Latest Range: 80.0-100.0 mmHg 101.0 (H)  Bicarbonate Latest Range: 20.0-24.0 mEq/L 14.5 (L)  TCO2 Latest Range: 0-100 mmol/L 13.2  Acid-base deficit  Latest Range: 0.0-2.0 mmol/L 9.1 (H)  O2 Saturation No range found 99.1   MICRO Recent Results (from the past 240 hour(s))  URINE CULTURE     Status: Normal   Collection Time   10/24/12  8:48 PM      Component Value Range Status Comment   Specimen Description URINE, CATHETERIZED   Final    Special Requests NONE   Final    Culture  Setup Time 10/25/2012 14:19   Final    Colony Count NO GROWTH   Final    Culture NO GROWTH   Final    Report Status 10/26/2012 FINAL   Final   CULTURE, BLOOD (ROUTINE X 2)     Status: Normal   Collection Time   10/25/12  1:52 AM      Component Value Range Status Comment   Specimen Description BLOOD LEFT ANTECUBITAL   Final    Special Requests     Final    Value: BOTTLES DRAWN AEROBIC AND ANAEROBIC 6CC  IMMUNE:COMPRM   Culture NO GROWTH 5 DAYS    Final    Report Status 10/30/2012 FINAL   Final   CULTURE, BLOOD (ROUTINE X 2)     Status: Normal   Collection Time   10/25/12  1:52 AM      Component Value Range Status Comment   Specimen Description BLOOD LEFT FOREARM   Final    Special Requests     Final    Value: BOTTLES DRAWN AEROBIC AND ANAEROBIC 6CC  IMMUNE:COMPRM   Culture NO GROWTH 5 DAYS   Final    Report Status 10/30/2012 FINAL   Final   STOOL CULTURE     Status: Normal   Collection Time   10/25/12 11:24 PM      Component Value Range Status Comment   Specimen Description STOOL   Final    Special Requests NONE   Final    Culture     Final    Value: NO SALMONELLA, SHIGELLA, CAMPYLOBACTER, YERSINIA, OR E.COLI 0157:H7 ISOLATED   Report Status 10/30/2012 FINAL   Final   CLOSTRIDIUM DIFFICILE BY PCR     Status: Normal   Collection Time   10/25/12 11:25 PM      Component Value Range Status Comment   C difficile by pcr NEGATIVE  NEGATIVE Final   OVA AND PARASITE EXAMINATION     Status: Normal   Collection Time   10/25/12 11:25 PM      Component Value Range Status Comment   Specimen Description STOOL   Final    Special Requests NONE   Final    Ova and parasites NO OVA OR PARASITES SEEN MODERATE WBC SEEN   Final    Report Status 10/29/2012 FINAL   Final   CULTURE, BLOOD (ROUTINE X 2)     Status: Normal (Preliminary result)   Collection Time   10/26/12 10:52 AM      Component Value Range Status Comment   Specimen Description BLOOD LEFT ARM   Final    Special Requests BOTTLES DRAWN AEROBIC AND ANAEROBIC 8CC   Final    Culture NO GROWTH 4 DAYS   Final    Report Status PENDING   Incomplete   CULTURE, BLOOD (ROUTINE X 2)     Status: Normal (Preliminary result)   Collection Time   10/26/12 11:12 AM      Component Value Range Status Comment   Specimen Description BLOOD A-LINE DRAW DRAWN BY RN   Final  Special Requests BOTTLES DRAWN AEROBIC AND ANAEROBIC 6CC   Final    Culture NO GROWTH 4 DAYS   Final    Report Status  PENDING   Incomplete   URINE CULTURE     Status: Normal   Collection Time   10/26/12  3:39 PM      Component Value Range Status Comment   Specimen Description URINE, RANDOM   Final    Special Requests NONE   Final    Culture  Setup Time 10/27/2012 05:34   Final    Colony Count NO GROWTH   Final    Culture NO GROWTH   Final    Report Status 10/28/2012 FINAL   Final   CLOSTRIDIUM DIFFICILE BY PCR     Status: Normal   Collection Time   10/29/12  2:00 AM      Component Value Range Status Comment   C difficile by pcr NEGATIVE  NEGATIVE Final       Lab Results  Component Value Date   CALCIUM 8.1* 10/31/2012               Impression: 1)Renal  AKI secondary to ATN                NON Oliguric ATN from Hypovolemia+ Hypotension  + SIRS                AKI worsening though  Urine output better with IVF + Lasix                NO need of Hd yet                   2)HTN  BP at goal   3)Anemia HGb at goal (9--11)   4)ONcology- Squamous cell ca of Esophagus                   Oncology following   5)ID - Febrile ? Source On ABX   6)FEN  Hypernatemic-  Secondary to Decreased free water intake  Albumin -Low    7)Acid base Co2 NOT at goal NON AG acidosis-secondary to GI losses ( more than 8 stools in past 24 hrs)     Plan:  1) Will suggest to Change IVF to 1/2 NS + 75 meq of Bicarb 2) Will suggest  To Change Free water to 250 ml 4 hrs via peg  After this d5 w IVF 1 liter is done  3) will folow [facility] closely      BHUTANI,MANPREET S 10/31/2012, 8:54 AM

## 2012-10-31 NOTE — Progress Notes (Signed)
Pt has two current orders for fluids.  Dr. Kerry Hough paged.

## 2012-10-31 NOTE — Progress Notes (Signed)
Physical Therapy Treatment Patient Details Name: Eddie [name] MRN: [id number] DOB: 06/07/1946 Today's Date: 10/31/2012 Time: 0940-1007 PT Time Calculation (min): 27 min  PT Assessment / Plan / Recommendation Comments on Treatment Session  Pt  is agreeable to limited PT today.  He tolerated ther ex with gentle strengthening while supine.  He would not ambulate in the hallway, but is able to get to the bathroom with no AD.  We will try to continue to encourage increased activity as he is able to tolerate.    Follow Up Recommendations        Does the patient have the potential to tolerate intense rehabilitation     Barriers to Discharge        Equipment Recommendations       Recommendations for Other Services    Frequency     Plan Discharge plan remains appropriate;Frequency remains appropriate    Precautions / Restrictions Restrictions Weight Bearing Restrictions: No   Pertinent Vitals/Pain     Mobility  Bed Mobility Supine to Sit: 6: Modified independent (Device/Increase time);HOB elevated Sit to Supine: 6: Modified independent (Device/Increase time);HOB elevated Transfers Sit to Stand: 6: Modified independent (Device/Increase time);From toilet;From bed Stand to Sit: 6: Modified independent (Device/Increase time);To toilet;To bed Ambulation/Gait Ambulation/Gait Assistance Details: pt declined to do formal gait training with PT, but he was assisted to the bathroom...he ambulated 10' x2 amd ambulated with no AD with no instability.Marland Kitchen.he will need  a walker for any further distance due to severe deconditioning Stairs: No Wheelchair Mobility Wheelchair Mobility: No    Exercises General Exercises - Lower Extremity Ankle Circles/Pumps: AROM;Both;10 reps;Supine;Strengthening Quad Sets: AROM;Both;10 reps;Supine Gluteal Sets: AROM;Both;10 reps;Supine Short Arc Quad: AROM;Both;10 reps;Supine Heel Slides: Strengthening;Both;Supine Hip ABduction/ADduction: Strengthening;Both;10  reps;Supine Straight Leg Raises: AROM;Both;10 reps;Supine   PT Diagnosis:    PT Problem List:   PT Treatment Interventions:     PT Goals Acute Rehab PT Goals PT Goal: Ambulate - Progress: Not progressing  Visit Information  Last PT Received On: 10/31/12    Subjective Data  Subjective: no c/o   Cognition       Balance  Balance Balance Assessed: No  End of Session PT - End of Session Equipment Utilized During Treatment: Gait belt Activity Tolerance: Patient limited by fatigue Patient left: in bed (he declines sitting in recliner)   GP     Myrlene Broker L 10/31/2012, 10:14 AM

## 2012-10-31 NOTE — Progress Notes (Signed)
[facility]             Progress Note   Subjective: Mouth and swallowing is improving. Had 2 bowel movements since this morning. No vomiting.  Objective: Vital signs in last 24 hours: Temp:  [97.4 F (36.3 C)-98.8 F (37.1 C)] 97.5 F (36.4 C) (11/27 0536) Pulse Rate:  [73-109] 109  (11/27 0536) Resp:  [18] 18  (11/27 0536) BP: (107-117)/(70-78) 110/74 mmHg (11/27 0536) SpO2:  [94 %-100 %] 99 % (11/27 0739) Weight:  [43.8 kg (96 lb 9 oz)] 43.8 kg (96 lb 9 oz) (11/26 1448) Weight change:  Last BM Date: 10/31/12  Intake/Output from previous day: 11/26 0701 - 11/27 0700 In: 1492.4 [I.V.:182.4; IV Piggyback:550] Out: 600 [Urine:600]     Physical Exam: General: Alert, awake, oriented x3, in no acute distress. HEENT: No bruits, no goiter. Heart: Regular rate and rhythm, without murmurs, rubs, gallops. Lungs: Clear to auscultation bilaterally. Abdomen: Soft, nontender, nondistended, positive bowel sounds. Extremities: No clubbing cyanosis or edema with positive pedal pulses. Neuro: Grossly intact, nonfocal.    Lab Results: Basic Metabolic Panel:  Basename 10/31/12 0521 10/30/12 0838  NA 147* 142  K 4.0 4.0  CL 121* 115*  CO2 15* 18*  GLUCOSE 143* 130*  BUN 45* 35*  CREATININE 2.14* 1.66*  CALCIUM 8.1* 8.1*  MG -- --  PHOS -- --   Liver Function Tests: No results found for this basename: AST:2,ALT:2,ALKPHOS:2,BILITOT:2,PROT:2,ALBUMIN:2 in the last 72 hours No results found for this basename: LIPASE:2,AMYLASE:2 in the last 72 hours No results found for this basename: AMMONIA:2 in the last 72 hours CBC:  Basename 10/30/12 0838 10/29/12 0525  WBC 7.8 6.0  NEUTROABS -- --  HGB 11.0* 10.7*  HCT 31.9* 31.7*  MCV 95.2 95.5  PLT 205 161   Cardiac Enzymes: No results found for this basename: CKTOTAL:3,CKMB:3,CKMBINDEX:3,TROPONINI:3 in the last 72 hours BNP: No results found for this basename: PROBNP:3 in the last 72 hours D-Dimer: No results  found for this basename: DDIMER:2 in the last 72 hours CBG:  Basename 10/31/12 1117 10/31/12 0724 10/30/12 2105 10/30/12 1610 10/30/12 1148 10/30/12 0729  GLUCAP 134* 121* 136* 122* 130* 114*   Hemoglobin A1C: No results found for this basename: HGBA1C in the last 72 hours Fasting Lipid Panel: No results found for this basename: CHOL,HDL,LDLCALC,TRIG,CHOLHDL,LDLDIRECT in the last 72 hours Thyroid Function Tests: No results found for this basename: TSH,T4TOTAL,FREET4,T3FREE,THYROIDAB in the last 72 hours Anemia Panel: No results found for this basename: VITAMINB12,FOLATE,FERRITIN,TIBC,IRON,RETICCTPCT in the last 72 hours Coagulation: No results found for this basename: LABPROT:2,INR:2 in the last 72 hours Urine Drug Screen: Drugs of Abuse  No results found for this basename: labopia, cocainscrnur, labbenz, amphetmu, thcu, labbarb    Alcohol Level: No results found for this basename: ETH:2 in the last 72 hours Urinalysis: No results found for this basename: COLORURINE:2,APPERANCEUR:2,LABSPEC:2,PHURINE:2,GLUCOSEU:2,HGBUR:2,BILIRUBINUR:2,KETONESUR:2,PROTEINUR:2,UROBILINOGEN:2,NITRITE:2,LEUKOCYTESUR:2 in the last 72 hours  Recent Results (from the past 240 hour(s))  URINE CULTURE     Status: Normal   Collection Time   10/24/12  8:48 PM      Component Value Range Status Comment   Specimen Description URINE, CATHETERIZED   Final    Special Requests NONE   Final    Culture  Setup Time 10/25/2012 14:19   Final    Colony Count NO GROWTH   Final    Culture NO GROWTH   Final    Report Status 10/26/2012 FINAL   Final   CULTURE, BLOOD (ROUTINE X 2)  Status: Normal   Collection Time   10/25/12  1:52 AM      Component Value Range Status Comment   Specimen Description BLOOD LEFT ANTECUBITAL   Final    Special Requests     Final    Value: BOTTLES DRAWN AEROBIC AND ANAEROBIC 6CC  IMMUNE:COMPRM   Culture NO GROWTH 5 DAYS   Final    Report Status 10/30/2012 FINAL   Final   CULTURE, BLOOD  (ROUTINE X 2)     Status: Normal   Collection Time   10/25/12  1:52 AM      Component Value Range Status Comment   Specimen Description BLOOD LEFT FOREARM   Final    Special Requests     Final    Value: BOTTLES DRAWN AEROBIC AND ANAEROBIC 6CC  IMMUNE:COMPRM   Culture NO GROWTH 5 DAYS   Final    Report Status 10/30/2012 FINAL   Final   STOOL CULTURE     Status: Normal   Collection Time   10/25/12 11:24 PM      Component Value Range Status Comment   Specimen Description STOOL   Final    Special Requests NONE   Final    Culture     Final    Value: NO SALMONELLA, SHIGELLA, CAMPYLOBACTER, YERSINIA, OR E.COLI 0157:H7 ISOLATED   Report Status 10/30/2012 FINAL   Final   CLOSTRIDIUM DIFFICILE BY PCR     Status: Normal   Collection Time   10/25/12 11:25 PM      Component Value Range Status Comment   C difficile by pcr NEGATIVE  NEGATIVE Final   OVA AND PARASITE EXAMINATION     Status: Normal   Collection Time   10/25/12 11:25 PM      Component Value Range Status Comment   Specimen Description STOOL   Final    Special Requests NONE   Final    Ova and parasites NO OVA OR PARASITES SEEN MODERATE WBC SEEN   Final    Report Status 10/29/2012 FINAL   Final   CULTURE, BLOOD (ROUTINE X 2)     Status: Normal   Collection Time   10/26/12 10:52 AM      Component Value Range Status Comment   Specimen Description BLOOD LEFT ARM   Final    Special Requests BOTTLES DRAWN AEROBIC AND ANAEROBIC 8CC   Final    Culture NO GROWTH 5 DAYS   Final    Report Status 10/31/2012 FINAL   Final   CULTURE, BLOOD (ROUTINE X 2)     Status: Normal   Collection Time   10/26/12 11:12 AM      Component Value Range Status Comment   Specimen Description BLOOD A-LINE DRAW DRAWN BY RN   Final    Special Requests BOTTLES DRAWN AEROBIC AND ANAEROBIC 6CC   Final    Culture NO GROWTH 5 DAYS   Final    Report Status 10/31/2012 FINAL   Final   URINE CULTURE     Status: Normal   Collection Time   10/26/12  3:39 PM       Component Value Range Status Comment   Specimen Description URINE, RANDOM   Final    Special Requests NONE   Final    Culture  Setup Time 10/27/2012 05:34   Final    Colony Count NO GROWTH   Final    Culture NO GROWTH   Final    Report Status 10/28/2012 FINAL   Final  CLOSTRIDIUM DIFFICILE BY PCR     Status: Normal   Collection Time   10/29/12  2:00 AM      Component Value Range Status Comment   C difficile by pcr NEGATIVE  NEGATIVE Final     Studies/Results: No results found.  Medications: Scheduled Meds:   . albuterol  2.5 mg Nebulization BID  . bismuth subsalicylate  30 mL Per Tube QID  . ciprofloxacin  200 mg Intravenous Q12H  . enoxaparin (LOVENOX) injection  30 mg Subcutaneous Q24H  . fluconazole (DIFLUCAN) IV  100 mg Intravenous Q24H  . folic acid  1 mg Per Tube Daily  . free water  100 mL Per Tube Q8H  . furosemide  40 mg Intravenous BID  . insulin aspart  0-5 Units Subcutaneous QHS  . insulin aspart  0-9 Units Subcutaneous TID WC  . magic mouthwash  5 mL Oral TID PC & HS   And  . lidocaine  5 mL Mouth/Throat TID PC & HS  . LORazepam  0.25 mg Intravenous Q8H  . metroNIDAZOLE  250 mg Per Tube TID  . [COMPLETED] metronidazole  250 mg Intravenous Q8H  . multivitamin  5 mL Per Tube Daily  . ondansetron (ZOFRAN) IV  8 mg Intravenous Q8H  . pantoprazole sodium  40 mg Per Tube BID  . thiamine  100 mg Per Tube Daily  . [DISCONTINUED] enoxaparin (LOVENOX) injection  40 mg Subcutaneous Q24H  . [DISCONTINUED] metroNIDAZOLE  250 mg Per Tube TID  . [DISCONTINUED] metronidazole  250 mg Intravenous Q8H  . [DISCONTINUED] vancomycin  750 mg Intravenous Q12H   Continuous Infusions:   . sodium chloride 125 mL/hr at 10/31/12 0507  . feeding supplement (JEVITY 1.2 CAL) 1,000 mL (10/30/12 1648)  . [DISCONTINUED] dextrose 5 % and 0.45% NaCl 1,000 mL with potassium chloride 20 mEq, sodium bicarbonate 50 mEq infusion 70 mL/hr at 10/30/12 1405   PRN Meds:.acetaminophen,  acetaminophen, albuterol, HYDROmorphone (DILAUDID) injection, LORazepam, ondansetron, polyethylene glycol, sodium chloride  Assessment/Plan:  Principal Problem:  *Nausea and vomiting Active Problems:  Squamous cell carcinoma of esophagus  Thrush  Stomatitis and mucositis  Throat pain  Leukocytosis (leucocytosis)  Dehydration  Cachexia  Chemotherapy adverse reaction  Severe malnutrition  Fever  Abdominal pain, generalized  Duodenal ulcer  Helicobacter pylori (H. pylori) infection  Thrombocytopenia  Acute renal insufficiency  Hiccups  Hyperglycemia  Enteritis  Metabolic acidosis   1. Nausea and vomiting, likely multifactorial. See below. Improved.   2. Severe stomatitis and oral candidiasis secondary to chemotherapy. The patient is still symptomatic, but overall less symptomatic on IV fluconazole, Magic mouthwash, and viscous lidocaine. Clinically, the stomatitis and mucositis appear to be resolving.   3. Fever. Etiology unknown at this time. He is afebrile now but continues to have episodic fevers. He has been afebrile for the past 24 hours. Followup chest x-ray reveals no acute abnormalities. His followup urinalysis is unremarkable. Blood cultures negative to date x4. Urine culture is negative. C. difficile toxin negative. Ova and parasite exam is negative. Stool culture negative. Lower extremity ultrasound negative for DVT. His PICC line was placed prior to this hospitalization. The site does not appear to be infected, but this remains a potential source. He has some abdominal tenderness, but this can be attributable to his recent diagnosis of a duodenal ulcer. Malignancy causing fever is also a consideration. Broad-spectrum antibiotic treatment was started with Zosyn and vancomycin, but was discontinued when the CT scans revealed only possible enteritis. He has known  H. pylori positivity. Cipro and Flagyl were started and Zosyn was discontinued. Continue to follow clinically.  4.  Acute renal failure. Renal function continues to deteriorate. Nephrology is following. Foley catheter was placed. We'll continue with IV fluids for now. He does not appear to be volume overloaded. He was started on Lasix yesterday but we will hold this for now. Followup with nephrology regarding further recommendations.  5. Metabolic acidosis. Likely secondary to worsening acute renal failure versus bicarbonate losses from loose stools. Will defer to nephrology regarding initiation of bicarbonate infusion.  6. Duodenal ulcer with H. pylori positivity. This may be a source of his abdominal soreness. He is followed by GI; their assessment and recommendations noted and appreciated. We'll continue twice a day PPI and defer treatment for H. pylori per GI. He was given Zosyn for several days which probably helped with some treatment in part. Flagyl and Cipro were started which will likely cover H. pylori in part. Continue PEG tube feeding.   7. Thrombocytopenia. Likely secondary to history of chemotherapy. Now resolved.   8. Loose stools. C. difficile PCR negative.   9. Severe malnutrition/cachexia. Recommendations per the registered dietitian noted and appreciated. To feedings titrated up to 55 cc an hour.   10. Squamous cell carcinoma of the esophagus. Status post chemotherapy and radiation therapy. He is being followed by oncology. Per oncology notes, the patient is responding to chemotherapy per imaging. Plans will be to resume chemotherapy and radiation once patient is discharged from the hospital.  11. Urinary retention. Foley catheter was placed by Dr. Jerre Simon. Patient did not have a large amount of urine output. IV fluids are recommended. Once his renal function has improved, he'll undergo voiding trial. If he still can not pass his urine, then a cystoscopy may be considered.  12. Disposition. Patient will be transferred to a skilled nursing facility once he is medically stable.  Time spent  coordinating care:   LOS: 7 days   Monserrath Junio [facility] Pager: [phone] 10/31/2012, 1:23 PM

## 2012-10-31 NOTE — Progress Notes (Signed)
Subjective: Patient is seen laying in bed.    He reports that his mouth pain is improved.  His abdominal pain is stable.  He asks for a cup of coffee.  I have asked the nurse to ascertain a cup of coffee for Eddie [name].  He asks if he can leave the hospital with his family members today for "like 45 minutes" to spend some time with them and come back.  I explained that he cannot leave the hospital with all of his IVs.  He asks if these can be disconnected.  I educated the patient that it is against medical advice that he leaves.  If he were to leave, and come back he would have to come back through the ED.  I have asked him to discuss this in more detail with the Hospitalist.   Objective: Vital signs in last 24 hours: Temp:  [97.4 F (36.3 C)-98.8 F (37.1 C)] 97.5 F (36.4 C) (11/27 0536) Pulse Rate:  [73-109] 109  (11/27 0536) Resp:  [18] 18  (11/27 0536) BP: (107-117)/(70-78) 110/74 mmHg (11/27 0536) SpO2:  [94 %-100 %] 99 % (11/27 0739) Weight:  [96 lb 9 oz (43.8 kg)] 96 lb 9 oz (43.8 kg) (11/26 1448)  Intake/Output from previous day: 11/26 0800 - 11/27 0759 In: 1492.4 [I.V.:182.4; IV Piggyback:550] Out: 600 [Urine:600] Intake/Output this shift:    General appearance: alert, cooperative, appears older than stated age, cachectic, mild distress and edematous lips and oral candidiasis (improving)  Resp: clear to auscultation bilaterally Cardio: regular rate and rhythm, S1, S2 normal, no murmur, click, rub or gallop GI: soft, non-tender; bowel sounds normal; no masses,  no organomegaly Extremities: extremities normal, atraumatic, no cyanosis or edema  Lab Results:   [facility] 10/30/12 0838 10/29/12 0525  WBC 7.8 6.0  HGB 11.0* 10.7*  HCT 31.9* 31.7*  PLT 205 161   BMET  Basename 10/31/12 0521 10/30/12 0838  NA 147* 142  K 4.0 4.0  CL 121* 115*  CO2 15* 18*  GLUCOSE 143* 130*  BUN 45* 35*  CREATININE 2.14* 1.66*  CALCIUM 8.1* 8.1*    Studies/Results: No results  found.  Medications: I have reviewed the patient's current medications.  Assessment/Plan: 1. Stomatitis, improving 2. Oral candidiasis, on IV Diflucan  3. Abdominal pain, followed by GI 4. Nausea/vomiting, improved 5. Squamous cell carcinoma of esophagus, S/P 3 cycles of 5 FU 400 mg/m2/day (starting on 10/11/2012) and concomitant radiation by Dr. Thersa Salt. 5 FU stopped today at 830-845 AM and this completes his 3rd cycle. This cancer is potentially curable. Interval regression of esophageal mass on CT of chest.  Regression is significant on review of CT. 6. Mild thrombocytopenia, reactive versus chemotherapy-induced. Resolved  7. Cachexia 8. Severe Malnutrition, on Jevity by GI. 9. Fevers, TMax24h 102.9.  On IV antibiotics.  No source identified at this time.  CT CAP unimpressive with regards to cause of fevers.  Negative [place] of LE for DVT. 10. Duodenal ulcer with H. Pylori positivity, will defer to GI. 11. Worsening renal function- this is not chemotherapy induced likely dehydration +/- Hypertension (on his problem list) related. The patient's NWG:NFAOZHYQMV ratio is greater than 20 and I suspect dehydration is playing a role in his worsening renal function. 12. Strongly encourage re-initiation of chemoradiation when patient is discharged and recovered.  Patient will require daily transportation for radiation and weekly transportation to [facility] for pump changes.  Chemotherapy has been reduced by 50% and will be restarted with radiation when  patient is fit for more therapy.   13. Please copy Oncology on discharge note and we can help facilitate arranging radiation and chemotherapy with rehabilitation facility.  Recommend [facility] if available.    Patient and plan discussed with Dr. Glenford Peers and he is in agreement with the aforementioned.  Patient seen and examined by Dr. Glenford Peers as well.      LOS: 7 days    KEFALAS,THOMAS 10/31/2012

## 2012-10-31 NOTE — Clinical Social Work Note (Signed)
CSW updated [facility] on pt and notified Debbie of need to begin treatment again soon. She is aware pt will require daily transportation to [facility] for radiation and it was cleared yesterday for facility to manage continuous chemo. CSW to continue to follow.  Derenda Fennel, [place] [phone]

## 2012-10-31 NOTE — Progress Notes (Signed)
Pt has two orders for fluids.  Dr. Kerry Hough paged and returned page.  Stated to give patient one bag of D5 100 ml at 125 ml/hr and then give the 1/2 NS with Bicarb at 125 after the bag of D5 is complete.  Stated that he will also discontinue the Lasix.  Stated that he will put the orders in.

## 2012-11-01 DIAGNOSIS — E870 Hyperosmolality and hypernatremia: Secondary | ICD-10-CM

## 2012-11-01 DIAGNOSIS — K5289 Other specified noninfective gastroenteritis and colitis: Secondary | ICD-10-CM

## 2012-11-01 LAB — GLUCOSE, CAPILLARY

## 2012-11-01 LAB — BASIC METABOLIC PANEL
CO2: 15 mEq/L — ABNORMAL LOW (ref 19–32)
Calcium: 8.1 mg/dL — ABNORMAL LOW (ref 8.4–10.5)
Creatinine, Ser: 2.66 mg/dL — ABNORMAL HIGH (ref 0.50–1.35)
Glucose, Bld: 114 mg/dL — ABNORMAL HIGH (ref 70–99)
Sodium: 148 mEq/L — ABNORMAL HIGH (ref 135–145)

## 2012-11-01 LAB — CREATININE, URINE, RANDOM: Creatinine, Urine: 128.72 mg/dL

## 2012-11-01 MED ORDER — CIPROFLOXACIN HCL 250 MG PO TABS
500.0000 mg | ORAL_TABLET | Freq: Two times a day (BID) | ORAL | Status: DC
  Administered 2012-11-01 – 2012-11-07 (×13): 500 mg via ORAL
  Filled 2012-11-01 (×3): qty 2
  Filled 2012-11-01: qty 1
  Filled 2012-11-01 (×9): qty 2

## 2012-11-01 MED ORDER — SODIUM BICARBONATE 8.4 % IV SOLN
INTRAVENOUS | Status: DC
  Administered 2012-11-01 – 2012-11-02 (×3): via INTRAVENOUS
  Filled 2012-11-01 (×4): qty 1000

## 2012-11-01 MED ORDER — LORAZEPAM 0.5 MG PO TABS
0.2500 mg | ORAL_TABLET | Freq: Three times a day (TID) | ORAL | Status: DC
  Administered 2012-11-01 – 2012-11-07 (×16): 0.25 mg
  Filled 2012-11-01 (×16): qty 1

## 2012-11-01 MED ORDER — POTASSIUM CHLORIDE 20 MEQ/15ML (10%) PO LIQD
40.0000 meq | Freq: Two times a day (BID) | ORAL | Status: DC
  Administered 2012-11-01 (×2): 40 meq
  Filled 2012-11-01 (×3): qty 30

## 2012-11-01 MED ORDER — CIPROFLOXACIN 500 MG/5ML (10%) PO SUSR: 500.0000 mg | Freq: Two times a day (BID) | ORAL | Status: DC

## 2012-11-01 MED ORDER — ONDANSETRON HCL 4 MG PO TABS
4.0000 mg | ORAL_TABLET | Freq: Four times a day (QID) | ORAL | Status: DC
  Administered 2012-11-01 – 2012-11-07 (×25): 4 mg
  Filled 2012-11-01 (×23): qty 1

## 2012-11-01 MED ORDER — LORAZEPAM 0.5 MG PO TABS
0.5000 mg | ORAL_TABLET | Freq: Two times a day (BID) | ORAL | Status: DC | PRN
  Administered 2012-11-01: 0.5 mg
  Filled 2012-11-01 (×2): qty 1

## 2012-11-01 MED ORDER — SODIUM BICARBONATE 650 MG PO TABS
650.0000 mg | ORAL_TABLET | Freq: Two times a day (BID) | ORAL | Status: DC
  Administered 2012-11-01 (×2): 650 mg via ORAL
  Filled 2012-11-01 (×3): qty 1

## 2012-11-01 MED ORDER — ONDANSETRON HCL 4 MG PO TABS: 4.0000 mg | ORAL_TABLET | Freq: Four times a day (QID) | ORAL | Status: DC | PRN

## 2012-11-01 NOTE — Progress Notes (Signed)
[facility]             Progress Note   Subjective: No new complaints, no shortness of breath, swallowing improving, drank coffee this morning.  Objective: Vital signs in last 24 hours: Temp:  [96 F (35.6 C)-98 F (36.7 C)] 97.5 F (36.4 C) (11/28 0458) Pulse Rate:  [86-92] 86  (11/28 0458) Resp:  [18] 18  (11/28 0458) BP: (106-120)/(74-86) 106/74 mmHg (11/28 0458) SpO2:  [100 %] 100 % (11/28 0832) Weight change:  Last BM Date: 11/01/12  Intake/Output from previous day: 11/27 0701 - 11/28 0700 In: 3727.9 [I.V.:1597.9; IV Piggyback:600] Out: 1050 [Urine:1050]     Physical Exam: General: Alert, awake, oriented x3, in no acute distress. HEENT: No bruits, no goiter. Heart: Regular rate and rhythm, without murmurs, rubs, gallops. Lungs: Clear to auscultation bilaterally. Abdomen: Soft, nontender, nondistended, positive bowel sounds. Extremities: No clubbing cyanosis or edema with positive pedal pulses. Neuro: Grossly intact, nonfocal.    Lab Results: Basic Metabolic Panel:  Basename 11/01/12 0519 10/31/12 0521  NA 148* 147*  K 3.1* 4.0  CL 122* 121*  CO2 15* 15*  GLUCOSE 114* 143*  BUN 54* 45*  CREATININE 2.66* 2.14*  CALCIUM 8.1* 8.1*  MG -- --  PHOS -- --   Liver Function Tests: No results found for this basename: AST:2,ALT:2,ALKPHOS:2,BILITOT:2,PROT:2,ALBUMIN:2 in the last 72 hours No results found for this basename: LIPASE:2,AMYLASE:2 in the last 72 hours No results found for this basename: AMMONIA:2 in the last 72 hours CBC:  Basename 10/30/12 0838  WBC 7.8  NEUTROABS --  HGB 11.0*  HCT 31.9*  MCV 95.2  PLT 205   Cardiac Enzymes: No results found for this basename: CKTOTAL:3,CKMB:3,CKMBINDEX:3,TROPONINI:3 in the last 72 hours BNP: No results found for this basename: PROBNP:3 in the last 72 hours D-Dimer: No results found for this basename: DDIMER:2 in the last 72 hours CBG:  Basename 11/01/12 0741 10/31/12 1605 10/31/12 1117  10/31/12 0724 10/30/12 2105 10/30/12 1610  GLUCAP 115* 97 134* 121* 136* 122*   Hemoglobin A1C: No results found for this basename: HGBA1C in the last 72 hours Fasting Lipid Panel: No results found for this basename: CHOL,HDL,LDLCALC,TRIG,CHOLHDL,LDLDIRECT in the last 72 hours Thyroid Function Tests: No results found for this basename: TSH,T4TOTAL,FREET4,T3FREE,THYROIDAB in the last 72 hours Anemia Panel: No results found for this basename: VITAMINB12,FOLATE,FERRITIN,TIBC,IRON,RETICCTPCT in the last 72 hours Coagulation: No results found for this basename: LABPROT:2,INR:2 in the last 72 hours Urine Drug Screen: Drugs of Abuse  No results found for this basename: labopia,  cocainscrnur,  labbenz,  amphetmu,  thcu,  labbarb    Alcohol Level: No results found for this basename: ETH:2 in the last 72 hours Urinalysis: No results found for this basename: COLORURINE:2,APPERANCEUR:2,LABSPEC:2,PHURINE:2,GLUCOSEU:2,HGBUR:2,BILIRUBINUR:2,KETONESUR:2,PROTEINUR:2,UROBILINOGEN:2,NITRITE:2,LEUKOCYTESUR:2 in the last 72 hours  Recent Results (from the past 240 hour(s))  URINE CULTURE     Status: Normal   Collection Time   10/24/12  8:48 PM      Component Value Range Status Comment   Specimen Description URINE, CATHETERIZED   Final    Special Requests NONE   Final    Culture  Setup Time 10/25/2012 14:19   Final    Colony Count NO GROWTH   Final    Culture NO GROWTH   Final    Report Status 10/26/2012 FINAL   Final   CULTURE, BLOOD (ROUTINE X 2)     Status: Normal   Collection Time   10/25/12  1:52 AM      Component  Value Range Status Comment   Specimen Description BLOOD LEFT ANTECUBITAL   Final    Special Requests     Final    Value: BOTTLES DRAWN AEROBIC AND ANAEROBIC 6CC  IMMUNE:COMPRM   Culture NO GROWTH 5 DAYS   Final    Report Status 10/30/2012 FINAL   Final   CULTURE, BLOOD (ROUTINE X 2)     Status: Normal   Collection Time   10/25/12  1:52 AM      Component Value Range Status  Comment   Specimen Description BLOOD LEFT FOREARM   Final    Special Requests     Final    Value: BOTTLES DRAWN AEROBIC AND ANAEROBIC 6CC  IMMUNE:COMPRM   Culture NO GROWTH 5 DAYS   Final    Report Status 10/30/2012 FINAL   Final   STOOL CULTURE     Status: Normal   Collection Time   10/25/12 11:24 PM      Component Value Range Status Comment   Specimen Description STOOL   Final    Special Requests NONE   Final    Culture     Final    Value: NO SALMONELLA, SHIGELLA, CAMPYLOBACTER, YERSINIA, OR E.COLI 0157:H7 ISOLATED   Report Status 10/30/2012 FINAL   Final   CLOSTRIDIUM DIFFICILE BY PCR     Status: Normal   Collection Time   10/25/12 11:25 PM      Component Value Range Status Comment   C difficile by pcr NEGATIVE  NEGATIVE Final   OVA AND PARASITE EXAMINATION     Status: Normal   Collection Time   10/25/12 11:25 PM      Component Value Range Status Comment   Specimen Description STOOL   Final    Special Requests NONE   Final    Ova and parasites NO OVA OR PARASITES SEEN MODERATE WBC SEEN   Final    Report Status 10/29/2012 FINAL   Final   CULTURE, BLOOD (ROUTINE X 2)     Status: Normal   Collection Time   10/26/12 10:52 AM      Component Value Range Status Comment   Specimen Description BLOOD LEFT ARM   Final    Special Requests BOTTLES DRAWN AEROBIC AND ANAEROBIC 8CC   Final    Culture NO GROWTH 5 DAYS   Final    Report Status 10/31/2012 FINAL   Final   CULTURE, BLOOD (ROUTINE X 2)     Status: Normal   Collection Time   10/26/12 11:12 AM      Component Value Range Status Comment   Specimen Description BLOOD A-LINE DRAW DRAWN BY RN   Final    Special Requests BOTTLES DRAWN AEROBIC AND ANAEROBIC 6CC   Final    Culture NO GROWTH 5 DAYS   Final    Report Status 10/31/2012 FINAL   Final   URINE CULTURE     Status: Normal   Collection Time   10/26/12  3:39 PM      Component Value Range Status Comment   Specimen Description URINE, RANDOM   Final    Special Requests NONE    Final    Culture  Setup Time 10/27/2012 05:34   Final    Colony Count NO GROWTH   Final    Culture NO GROWTH   Final    Report Status 10/28/2012 FINAL   Final   CLOSTRIDIUM DIFFICILE BY PCR     Status: Normal   Collection Time  10/29/12  2:00 AM      Component Value Range Status Comment   C difficile by pcr NEGATIVE  NEGATIVE Final     Studies/Results: No results found.  Medications: Scheduled Meds:    . albuterol  2.5 mg Nebulization BID  . bismuth subsalicylate  30 mL Per Tube QID  . ciprofloxacin  200 mg Intravenous Q12H  . [COMPLETED] dextrose 5 % 1,000 mL infusion   Intravenous Once  . enoxaparin (LOVENOX) injection  30 mg Subcutaneous Q24H  . fluconazole (DIFLUCAN) IV  100 mg Intravenous Q24H  . folic acid  1 mg Per Tube Daily  . free water  100 mL Per Tube Q8H  . insulin aspart  0-5 Units Subcutaneous QHS  . insulin aspart  0-9 Units Subcutaneous TID WC  . magic mouthwash  5 mL Oral TID PC & HS   And  . lidocaine  5 mL Mouth/Throat TID PC & HS  . LORazepam  0.25 mg Intravenous Q8H  . metroNIDAZOLE  250 mg Per Tube TID  . multivitamin  5 mL Per Tube Daily  . ondansetron (ZOFRAN) IV  8 mg Intravenous Q8H  . pantoprazole sodium  40 mg Per Tube BID  . potassium chloride  40 mEq Per Tube BID  . sodium bicarbonate  650 mg Oral BID  . thiamine  100 mg Per Tube Daily  . [DISCONTINUED] enoxaparin (LOVENOX) injection  40 mg Subcutaneous Q24H  . [DISCONTINUED] furosemide  40 mg Intravenous BID   Continuous Infusions:    . feeding supplement (JEVITY 1.2 CAL) 1,000 mL (10/30/12 1648)  . sodium chloride 0.45 % 1,000 mL with sodium bicarbonate 50 mEq infusion    . [DISCONTINUED] sodium chloride Stopped (10/31/12 1415)  . [DISCONTINUED] sodium chloride 125 mL/hr at 10/31/12 0507  . [DISCONTINUED] dextrose 5 % 1,000 mL infusion Stopped (10/31/12 1345)  . [DISCONTINUED] sodium chloride 0.45 % 1,000 mL with sodium bicarbonate 75 mEq infusion 125 mL/hr at 11/01/12 0216   PRN  Meds:.acetaminophen, acetaminophen, albuterol, HYDROmorphone (DILAUDID) injection, LORazepam, ondansetron, polyethylene glycol, sodium chloride  Assessment/Plan:  Principal Problem:  *Nausea and vomiting Active Problems:  Squamous cell carcinoma of esophagus  Thrush  Stomatitis and mucositis  Throat pain  Leukocytosis (leucocytosis)  Dehydration  Cachexia  Chemotherapy adverse reaction  Severe malnutrition  Fever  Abdominal pain, generalized  Duodenal ulcer  Helicobacter pylori (H. pylori) infection  Thrombocytopenia  Acute renal insufficiency  Hiccups  Hyperglycemia  Enteritis  Metabolic acidosis  Hypernatremia   1. Nausea and vomiting, likely multifactorial. See below. Improved.   2. Severe stomatitis and oral candidiasis secondary to chemotherapy. The patient is still symptomatic, but overall less symptomatic on IV fluconazole, Magic mouthwash, and viscous lidocaine. Clinically, the stomatitis and mucositis appear to be resolving.   3. Fever. Etiology unknown at this time. He is afebrile now but continues to have episodic fevers. He has been afebrile for the past 24 hours. Followup chest x-ray reveals no acute abnormalities. His followup urinalysis is unremarkable. Blood cultures negative to date x4. Urine culture is negative. C. difficile toxin negative. Ova and parasite exam is negative. Stool culture negative. Lower extremity ultrasound negative for DVT. His PICC line was placed prior to this hospitalization. The site does not appear to be infected, but this remains a potential source. He has some abdominal tenderness, but this can be attributable to his recent diagnosis of a duodenal ulcer. Malignancy causing fever is also a consideration. Broad-spectrum antibiotic treatment was started with Zosyn and  vancomycin, but was discontinued when the CT scans revealed only possible enteritis. He has known H. pylori positivity. Cipro and Flagyl were started and Zosyn was discontinued.  Continue to follow clinically.  4. Acute renal failure. Possibly due to recent medications including chemo. Renal function continues to deteriorate. Nephrology is following. Foley catheter was placed. Fluids are being adjusted.  He has been restarted on lasix. Will continue to follow.  5. Metabolic acidosis. Likely secondary to worsening acute renal failure versus bicarbonate losses from loose stools. Patient is on bicarbonate infusion.  6. Duodenal ulcer with H. pylori positivity. This may be a source of his abdominal soreness. He is followed by GI; their assessment and recommendations noted and appreciated. We'll continue twice a day PPI and defer treatment for H. pylori per GI. He was given Zosyn for several days which probably helped with some treatment in part. Flagyl and Cipro were started which will likely cover H. pylori in part. Continue PEG tube feeding.   7. Thrombocytopenia. Likely secondary to history of chemotherapy. Now resolved.   8. Loose stools. C. difficile PCR negative.   9. Severe malnutrition/cachexia. Recommendations per the registered dietitian noted and appreciated. To feedings titrated up to 55 cc an hour.   10. Squamous cell carcinoma of the esophagus. Status post chemotherapy and radiation therapy. He is being followed by oncology. Per oncology notes, the patient is responding to chemotherapy per imaging. Plans will be to resume chemotherapy and radiation once patient is discharged from the hospital.  11. Urinary retention. Foley catheter was placed by Dr. Jerre Simon. Patient did not have a large amount of urine output. IV fluids are recommended. Once his renal function has improved, he'll undergo voiding trial. If he still can not pass his urine, then a cystoscopy may be considered.  12. Hypernatremia.  Likely due to sodium bicarbonate.  IV fluids have been adjusted.  Continue to follow.  12. Disposition. Patient will be transferred to a skilled nursing facility once he  is medically stable.  Time spent coordinating care:   LOS: 8 days   MEMON,JEHANZEB [facility] Pager: [phone] 11/01/2012, 10:41 AM

## 2012-11-01 NOTE — Progress Notes (Signed)
Pt requesting [employer] to drink.  Nurse asked Dr. Kerry Hough if this is ok.  Dr. Kerry Hough said yes.  Family bringing [place] later today.

## 2012-11-01 NOTE — Plan of Care (Signed)
Problem: Phase I Progression Outcomes Goal: Initial discharge plan identified Outcome: Completed/Met Date Met:  11/01/12 Plans to go to [facility] at discharge.

## 2012-11-01 NOTE — Progress Notes (Signed)
Subjective: Interval History: has no complaint of difficulty in breathing. Patient has however some nausea. Still he has a poor appetite..  Objective: Vital signs in last 24 hours: Temp:  [96 F (35.6 C)-98 F (36.7 C)] 97.5 F (36.4 C) (11/28 0458) Pulse Rate:  [86-92] 86  (11/28 0458) Resp:  [18] 18  (11/28 0458) BP: (106-120)/(74-86) 106/74 mmHg (11/28 0458) SpO2:  [100 %] 100 % (11/28 0832) Weight change:   Intake/Output from previous day: 11/27 0701 - 11/28 0700 In: 3727.9 [I.V.:1597.9; IV Piggyback:600] Out: 1050 [Urine:1050] Intake/Output this shift:    General appearance: alert, cooperative and no distress Resp: clear to auscultation bilaterally Cardio: regular rate and rhythm, S1, S2 normal, no murmur, click, rub or gallop GI: soft, non-tender; bowel sounds normal; no masses,  no organomegaly Extremities: extremities normal, atraumatic, no cyanosis or edema  Lab Results:  [facility] 10/30/12 0838  WBC 7.8  HGB 11.0*  HCT 31.9*  PLT 205   BMET:  Basename 11/01/12 0519 10/31/12 0521  NA 148* 147*  K 3.1* 4.0  CL 122* 121*  CO2 15* 15*  GLUCOSE 114* 143*  BUN 54* 45*  CREATININE 2.66* 2.14*  CALCIUM 8.1* 8.1*   No results found for this basename: PTH:2 in the last 72 hours Iron Studies: No results found for this basename: IRON,TIBC,TRANSFERRIN,FERRITIN in the last 72 hours  Studies/Results: No results found.  I have reviewed the patient's current medications.  Assessment/Plan: Problem #1 renal failure acute on chronic his BUN is 54 creatinine is 2.66 renal function still unstable and continue to deteriorate. Presently patient is none oliguric. Problem #2 metabolic acidosis is on sodium bicarbonate his CO2 is 15 no significant improvement Problem #3 hypokalemia most likely secondary to renal loss Problem #4 hypernatremia most likely secondary to IV fluid with sodium bicarbonate. His sodium is 148. Problem #5 anemia his hemoglobin is 11 hematocrit  31. Problem #6 history of, cell carcinoma of the esophagus Problem #7 history of poor nutrition presently he is status post PEG placement. Problem #8 history of esophagitis and stomatitis.  Plan: We'll decrease sodium bicarbonate in his IV fluid to 50 mEq and increase IV rate to 100 cc per hour. Will start patient KCl 40 mEq per feeding tube twice a day Will start patient on sodium bicarbonate 650 mg per PEG twice a day We'll check his basic metabolic panel and phosphorus in the morning. We'll start him on Lasix 40 mg IV twice a day.   LOS: 8 days   Genita Nilsson S 11/01/2012,9:45 AM

## 2012-11-01 NOTE — Plan of Care (Signed)
Problem: Phase III Progression Outcomes Goal: Discharge plan remains appropriate-arrangements made Outcome: Progressing Plans to go to [facility] at discharge.

## 2012-11-02 DIAGNOSIS — E872 Acidosis, unspecified: Secondary | ICD-10-CM

## 2012-11-02 LAB — GLUCOSE, CAPILLARY: Glucose-Capillary: 109 mg/dL — ABNORMAL HIGH (ref 70–99)

## 2012-11-02 LAB — BASIC METABOLIC PANEL
BUN: 58 mg/dL — ABNORMAL HIGH (ref 6–23)
Calcium: 8.4 mg/dL (ref 8.4–10.5)
GFR calc Af Amer: 24 mL/min — ABNORMAL LOW (ref >90)
GFR calc non Af Amer: 21 mL/min — ABNORMAL LOW (ref >90)
Potassium: 3.8 mEq/L (ref 3.5–5.1)

## 2012-11-02 MED ORDER — SODIUM BICARBONATE 650 MG PO TABS
1300.0000 mg | ORAL_TABLET | Freq: Two times a day (BID) | ORAL | Status: DC
  Administered 2012-11-02 – 2012-11-07 (×11): 1300 mg via ORAL
  Filled 2012-11-02 (×10): qty 2

## 2012-11-02 MED ORDER — LOPERAMIDE HCL 2 MG PO CAPS
2.0000 mg | ORAL_CAPSULE | Freq: Two times a day (BID) | ORAL | Status: DC
  Administered 2012-11-02 – 2012-11-07 (×11): 2 mg via ORAL
  Filled 2012-11-02 (×11): qty 1

## 2012-11-02 MED ORDER — BISMUTH SUBSALICYLATE 262 MG/15ML PO SUSP
ORAL | Status: AC
  Filled 2012-11-02: qty 236

## 2012-11-02 MED ORDER — POTASSIUM CHLORIDE 20 MEQ/15ML (10%) PO LIQD
10.0000 meq | Freq: Two times a day (BID) | ORAL | Status: DC
  Administered 2012-11-02 (×2): 10 meq
  Filled 2012-11-02: qty 30

## 2012-11-02 MED ORDER — FUROSEMIDE 10 MG/ML IJ SOLN
80.0000 mg | Freq: Two times a day (BID) | INTRAMUSCULAR | Status: DC
  Administered 2012-11-02 (×2): 80 mg via INTRAVENOUS
  Filled 2012-11-02 (×2): qty 8

## 2012-11-02 MED ORDER — SODIUM BICARBONATE 8.4 % IV SOLN
INTRAVENOUS | Status: DC
  Administered 2012-11-02 – 2012-11-04 (×6): via INTRAVENOUS
  Filled 2012-11-02 (×10): qty 1000

## 2012-11-02 NOTE — Progress Notes (Signed)
UR Chart Review Completed  

## 2012-11-02 NOTE — Progress Notes (Signed)
Nutrition Follow-up  Intervention: Continuous Jevity 1.2 @ 55 ml/hr via Jejunostomy tube. Flushes of free water 100 ml q 8 h.  Assessment:  Pt says his mouth is feeling better and he's having less nausea. New wt obtained which reflects 2.5# (1kg) wt loss x 3d ; related to fluid loss? Lasix increased.   Current TF rate providing: 1584 kcal (36kcal/kg), 73 gr protein (1.6 gr/kg), 1065 ml water from formula. Flushes 300 ml free water/day. Adequate to meet >90% current estimated energy and protein needs.  Diet Order:  Pt says MD ok for him to sip/drink orange juice and soft drinks.  IVF-D5 -1/4 NS @ 125 ml/hr continuous rate provides ~500 kcal/day  Meds: Scheduled Meds:   . albuterol  2.5 mg Nebulization BID  . bismuth subsalicylate  30 mL Per Tube QID  . ciprofloxacin  500 mg Oral BID  . enoxaparin (LOVENOX) injection  30 mg Subcutaneous Q24H  . fluconazole (DIFLUCAN) IV  100 mg Intravenous Q24H  . folic acid  1 mg Per Tube Daily  . free water  100 mL Per Tube Q8H  . furosemide  80 mg Intravenous BID  . insulin aspart  0-5 Units Subcutaneous QHS  . insulin aspart  0-9 Units Subcutaneous TID WC  . magic mouthwash  5 mL Oral TID PC & HS   And  . lidocaine  5 mL Mouth/Throat TID PC & HS  . loperamide  2 mg Oral BID  . LORazepam  0.25 mg Per Tube Q8H  . metroNIDAZOLE  250 mg Per Tube TID  . multivitamin  5 mL Per Tube Daily  . ondansetron  4 mg Per Tube Q6H  . pantoprazole sodium  40 mg Per Tube BID  . potassium chloride  10 mEq Per Tube BID  . sodium bicarbonate  1,300 mg Oral BID  . thiamine  100 mg Per Tube Daily  . [DISCONTINUED] LORazepam  0.25 mg Intravenous Q8H  . [DISCONTINUED] ondansetron (ZOFRAN) IV  8 mg Intravenous Q8H  . [DISCONTINUED] potassium chloride  40 mEq Per Tube BID  . [DISCONTINUED] sodium bicarbonate  650 mg Oral BID   Continuous Infusions:   . dextrose 5 % and 0.2 % NaCl 1,000 mL with sodium bicarbonate 50 mEq infusion 125 mL/hr at 11/02/12 1135  .  feeding supplement (JEVITY 1.2 CAL) 1,000 mL (11/02/12 0931)  . [DISCONTINUED] sodium chloride 0.45 % 1,000 mL with sodium bicarbonate 50 mEq infusion 125 mL/hr at 11/02/12 0631   PRN Meds:.acetaminophen, acetaminophen, albuterol, HYDROmorphone (DILAUDID) injection, LORazepam, polyethylene glycol, sodium chloride, [DISCONTINUED] LORazepam, [DISCONTINUED] ondansetron   CMP     Component Value Date/Time   NA 151* 11/02/2012 0525   K 3.8 11/02/2012 0525   CL 123* 11/02/2012 0525   CO2 16* 11/02/2012 0525   GLUCOSE 117* 11/02/2012 0525   BUN 58* 11/02/2012 0525   CREATININE 2.95* 11/02/2012 0525   CREATININE 1.12 07/30/2012 0950   CALCIUM 8.4 11/02/2012 0525   PROT 4.7* 10/28/2012 0622   ALBUMIN 1.9* 10/28/2012 0622   AST 9 10/28/2012 0622   ALT 6 10/28/2012 0622   ALKPHOS 38* 10/28/2012 0622   BILITOT 0.7 10/28/2012 0622   GFRNONAA 21* 11/02/2012 0525   GFRAA 24* 11/02/2012 0525    CBG (last 3)   Basename 11/01/12 2147 11/01/12 1626 11/01/12 1108  GLUCAP 107* 103* 115*     Intake/Output Summary (Last 24 hours) at 11/02/12 1350 Last data filed at 11/02/12 0428  Gross per 24 hour  Intake  637.5 ml  Output    400 ml  Net  237.5 ml    Weight Status: 94.1# (42.7 kg) 11/02/12    Wt 11/26 -96.4# (43.7kg) Wt  11/25 105#  (47.6)  Re-estimated needs: 1504-1739 kcal, 75-94 gr protein daily   NUTRITION DIAGNOSIS:  -Inadequate oral intake (NI-2.1). Status: Ongoing   RELATED TO: esophageal carcinoma   AS EVIDENCE BY: Non-volitional wt loss hx, NPO /Clear Liquids status, increased nutrition needs with catabolic illness    [phone]

## 2012-11-02 NOTE — Progress Notes (Signed)
[facility]             Progress Note   Subjective: Patient reports having frequent bowel movements this morning.  Has already had 3-4 BM today. No vomiting.  Feels very weak and gets short of breath while moving to the bathroom.  Objective: Vital signs in last 24 hours: Temp:  [97.2 F (36.2 C)-97.4 F (36.3 C)] 97.2 F (36.2 C) (11/29 0427) Pulse Rate:  [82-88] 88  (11/29 0427) Resp:  [18] 18  (11/29 0427) BP: (91-110)/(68-75) 110/75 mmHg (11/29 0427) SpO2:  [96 %-100 %] 96 % (11/29 0711) Weight change:  Last BM Date: 11/01/12  Intake/Output from previous day: 11/28 0701 - 11/29 0700 In: 637.5 [I.V.:637.5] Out: 400 [Urine:400]     Physical Exam: General: Alert, awake, oriented x3, in no acute distress. HEENT: No bruits, no goiter. Heart: Regular rate and rhythm, without murmurs, rubs, gallops. Lungs: Clear to auscultation bilaterally. Abdomen: Soft, nontender, nondistended, positive bowel sounds. Extremities: No clubbing cyanosis or edema with positive pedal pulses. Neuro: Grossly intact, nonfocal.    Lab Results: Basic Metabolic Panel:  Basename 11/02/12 0525 11/01/12 0519  NA 151* 148*  K 3.8 3.1*  CL 123* 122*  CO2 16* 15*  GLUCOSE 117* 114*  BUN 58* 54*  CREATININE 2.95* 2.66*  CALCIUM 8.4 8.1*  MG -- --  PHOS 4.6 --   Liver Function Tests: No results found for this basename: AST:2,ALT:2,ALKPHOS:2,BILITOT:2,PROT:2,ALBUMIN:2 in the last 72 hours No results found for this basename: LIPASE:2,AMYLASE:2 in the last 72 hours No results found for this basename: AMMONIA:2 in the last 72 hours CBC: No results found for this basename: WBC:2,NEUTROABS:2,HGB:2,HCT:2,MCV:2,PLT:2 in the last 72 hours Cardiac Enzymes: No results found for this basename: CKTOTAL:3,CKMB:3,CKMBINDEX:3,TROPONINI:3 in the last 72 hours BNP: No results found for this basename: PROBNP:3 in the last 72 hours D-Dimer: No results found for this basename: DDIMER:2 in the  last 72 hours CBG:  Basename 11/01/12 2147 11/01/12 1626 11/01/12 1108 11/01/12 0741 10/31/12 1605 10/31/12 1117  GLUCAP 107* 103* 115* 115* 97 134*   Hemoglobin A1C: No results found for this basename: HGBA1C in the last 72 hours Fasting Lipid Panel: No results found for this basename: CHOL,HDL,LDLCALC,TRIG,CHOLHDL,LDLDIRECT in the last 72 hours Thyroid Function Tests: No results found for this basename: TSH,T4TOTAL,FREET4,T3FREE,THYROIDAB in the last 72 hours Anemia Panel: No results found for this basename: VITAMINB12,FOLATE,FERRITIN,TIBC,IRON,RETICCTPCT in the last 72 hours Coagulation: No results found for this basename: LABPROT:2,INR:2 in the last 72 hours Urine Drug Screen: Drugs of Abuse  No results found for this basename: labopia,  cocainscrnur,  labbenz,  amphetmu,  thcu,  labbarb    Alcohol Level: No results found for this basename: ETH:2 in the last 72 hours Urinalysis: No results found for this basename: COLORURINE:2,APPERANCEUR:2,LABSPEC:2,PHURINE:2,GLUCOSEU:2,HGBUR:2,BILIRUBINUR:2,KETONESUR:2,PROTEINUR:2,UROBILINOGEN:2,NITRITE:2,LEUKOCYTESUR:2 in the last 72 hours  Recent Results (from the past 240 hour(s))  URINE CULTURE     Status: Normal   Collection Time   10/24/12  8:48 PM      Component Value Range Status Comment   Specimen Description URINE, CATHETERIZED   Final    Special Requests NONE   Final    Culture  Setup Time 10/25/2012 14:19   Final    Colony Count NO GROWTH   Final    Culture NO GROWTH   Final    Report Status 10/26/2012 FINAL   Final   CULTURE, BLOOD (ROUTINE X 2)     Status: Normal   Collection Time   10/25/12  1:52 AM  Component Value Range Status Comment   Specimen Description BLOOD LEFT ANTECUBITAL   Final    Special Requests     Final    Value: BOTTLES DRAWN AEROBIC AND ANAEROBIC 6CC  IMMUNE:COMPRM   Culture NO GROWTH 5 DAYS   Final    Report Status 10/30/2012 FINAL   Final   CULTURE, BLOOD (ROUTINE X 2)     Status: Normal    Collection Time   10/25/12  1:52 AM      Component Value Range Status Comment   Specimen Description BLOOD LEFT FOREARM   Final    Special Requests     Final    Value: BOTTLES DRAWN AEROBIC AND ANAEROBIC 6CC  IMMUNE:COMPRM   Culture NO GROWTH 5 DAYS   Final    Report Status 10/30/2012 FINAL   Final   STOOL CULTURE     Status: Normal   Collection Time   10/25/12 11:24 PM      Component Value Range Status Comment   Specimen Description STOOL   Final    Special Requests NONE   Final    Culture     Final    Value: NO SALMONELLA, SHIGELLA, CAMPYLOBACTER, YERSINIA, OR E.COLI 0157:H7 ISOLATED   Report Status 10/30/2012 FINAL   Final   CLOSTRIDIUM DIFFICILE BY PCR     Status: Normal   Collection Time   10/25/12 11:25 PM      Component Value Range Status Comment   C difficile by pcr NEGATIVE  NEGATIVE Final   OVA AND PARASITE EXAMINATION     Status: Normal   Collection Time   10/25/12 11:25 PM      Component Value Range Status Comment   Specimen Description STOOL   Final    Special Requests NONE   Final    Ova and parasites NO OVA OR PARASITES SEEN MODERATE WBC SEEN   Final    Report Status 10/29/2012 FINAL   Final   CULTURE, BLOOD (ROUTINE X 2)     Status: Normal   Collection Time   10/26/12 10:52 AM      Component Value Range Status Comment   Specimen Description BLOOD LEFT ARM   Final    Special Requests BOTTLES DRAWN AEROBIC AND ANAEROBIC 8CC   Final    Culture NO GROWTH 5 DAYS   Final    Report Status 10/31/2012 FINAL   Final   CULTURE, BLOOD (ROUTINE X 2)     Status: Normal   Collection Time   10/26/12 11:12 AM      Component Value Range Status Comment   Specimen Description BLOOD A-LINE DRAW DRAWN BY RN   Final    Special Requests BOTTLES DRAWN AEROBIC AND ANAEROBIC 6CC   Final    Culture NO GROWTH 5 DAYS   Final    Report Status 10/31/2012 FINAL   Final   URINE CULTURE     Status: Normal   Collection Time   10/26/12  3:39 PM      Component Value Range Status Comment    Specimen Description URINE, RANDOM   Final    Special Requests NONE   Final    Culture  Setup Time 10/27/2012 05:34   Final    Colony Count NO GROWTH   Final    Culture NO GROWTH   Final    Report Status 10/28/2012 FINAL   Final   CLOSTRIDIUM DIFFICILE BY PCR     Status: Normal   Collection Time  10/29/12  2:00 AM      Component Value Range Status Comment   C difficile by pcr NEGATIVE  NEGATIVE Final     Studies/Results: No results found.  Medications: Scheduled Meds:    . albuterol  2.5 mg Nebulization BID  . bismuth subsalicylate  30 mL Per Tube QID  . ciprofloxacin  500 mg Oral BID  . enoxaparin (LOVENOX) injection  30 mg Subcutaneous Q24H  . fluconazole (DIFLUCAN) IV  100 mg Intravenous Q24H  . folic acid  1 mg Per Tube Daily  . free water  100 mL Per Tube Q8H  . furosemide  80 mg Intravenous BID  . insulin aspart  0-5 Units Subcutaneous QHS  . insulin aspart  0-9 Units Subcutaneous TID WC  . magic mouthwash  5 mL Oral TID PC & HS   And  . lidocaine  5 mL Mouth/Throat TID PC & HS  . LORazepam  0.25 mg Per Tube Q8H  . metroNIDAZOLE  250 mg Per Tube TID  . multivitamin  5 mL Per Tube Daily  . ondansetron  4 mg Per Tube Q6H  . pantoprazole sodium  40 mg Per Tube BID  . potassium chloride  10 mEq Per Tube BID  . sodium bicarbonate  1,300 mg Oral BID  . thiamine  100 mg Per Tube Daily  . [DISCONTINUED] LORazepam  0.25 mg Intravenous Q8H  . [DISCONTINUED] ondansetron (ZOFRAN) IV  8 mg Intravenous Q8H  . [DISCONTINUED] potassium chloride  40 mEq Per Tube BID  . [DISCONTINUED] sodium bicarbonate  650 mg Oral BID   Continuous Infusions:    . dextrose 5 % and 0.2 % NaCl 1,000 mL with sodium bicarbonate 50 mEq infusion 125 mL/hr at 11/02/12 1135  . feeding supplement (JEVITY 1.2 CAL) 1,000 mL (11/02/12 0931)  . [DISCONTINUED] sodium chloride 0.45 % 1,000 mL with sodium bicarbonate 50 mEq infusion 125 mL/hr at 11/02/12 0631   PRN Meds:.acetaminophen, acetaminophen,  albuterol, HYDROmorphone (DILAUDID) injection, LORazepam, polyethylene glycol, sodium chloride, [DISCONTINUED] LORazepam, [DISCONTINUED] ondansetron  Assessment/Plan:  Principal Problem:  *Nausea and vomiting Active Problems:  Squamous cell carcinoma of esophagus  Thrush  Stomatitis and mucositis  Throat pain  Leukocytosis (leucocytosis)  Dehydration  Cachexia  Chemotherapy adverse reaction  Severe malnutrition  Fever  Abdominal pain, generalized  Duodenal ulcer  Helicobacter pylori (H. pylori) infection  Thrombocytopenia  Acute renal insufficiency  Hiccups  Hyperglycemia  Enteritis  Metabolic acidosis  Hypernatremia   1. Nausea and vomiting, likely multifactorial. See below. Improved.   2. Severe stomatitis and oral candidiasis secondary to chemotherapy. Clinically improving on IV fluconazole, Magic mouthwash, and viscous lidocaine.    3. Fever. Likely secondary to enteritis. His fevers appear to have resolved. Followup chest x-ray reveals no acute abnormalities. His followup urinalysis is unremarkable. Blood cultures negative to date x4. Urine culture is negative. C. difficile toxin negative. Ova and parasite exam is negative. Stool culture negative. Lower extremity ultrasound negative for DVT. He has some abdominal tenderness, but this can be attributable to his recent diagnosis of a duodenal ulcer. Broad-spectrum antibiotic treatment was started with Zosyn and vancomycin, but was discontinued when the CT scans revealed only possible enteritis. He has known H. pylori positivity. Cipro and Flagyl were started and Zosyn was discontinued. Will discontinue cipro/flagyl on 12/3 for total of 10 days.  4. Acute renal failure. Possibly due to recent medications including chemo. Renal function continues to deteriorate. Nephrology is following. Patient is oliguric. Foley catheter was placed.  Fluids are being adjusted.  He is on lasix. Will continue to follow.  5. Metabolic acidosis.  Likely secondary to worsening acute renal failure versus bicarbonate losses from loose stools. Patient is on bicarbonate infusion.  6. Duodenal ulcer with H. pylori positivity. This may be a source of his abdominal soreness. He is followed by GI; their assessment and recommendations noted and appreciated. We'll continue twice a day PPI and defer treatment for H. pylori per GI. He was given Zosyn for several days which probably helped with some treatment in part. Flagyl and Cipro were started which will likely cover H. pylori in part. Continue PEG tube feeding.   7. Thrombocytopenia. Likely secondary to history of chemotherapy. Now resolved.   8. Loose stools. C. difficile PCR negative. Possibly related to tube feeds.  Will give imodium   9. Severe malnutrition/cachexia. Recommendations per the registered dietitian noted and appreciated. To feedings titrated up to 55 cc an hour.   10. Squamous cell carcinoma of the esophagus. Status post chemotherapy and radiation therapy. He is being followed by oncology. Per oncology notes, the patient is responding to chemotherapy per imaging. Plans will be to resume chemotherapy and radiation once patient is discharged from the hospital.  11. Urinary retention. Foley catheter was placed by Dr. Jerre Simon. Patient did not have a large amount of urine output. IV fluids are recommended. Once his renal function has improved, he'll undergo voiding trial. If he still can not pass his urine after foley removed, then a cystoscopy may be considered.  12. Hypernatremia.  Likely due to sodium bicarbonate.  IV fluids have been adjusted.  Continue to follow.  13. Disposition. Patient will be transferred to a skilled nursing facility once he is medically stable.  Time spent coordinating care:   LOS: 9 days   MEMON,JEHANZEB [facility] Pager: [phone] 11/02/2012, 12:46 PM

## 2012-11-02 NOTE — Progress Notes (Signed)
Subjective: Interval History: has complaints appetite. He has some nausea but no vomiting. Patient denies any difficulty breathing no orthopnea or paroxysmal nocturnal dyspnea..  Objective: Vital signs in last 24 hours: Temp:  [97.2 F (36.2 C)-97.4 F (36.3 C)] 97.2 F (36.2 C) (11/29 0427) Pulse Rate:  [82-88] 88  (11/29 0427) Resp:  [18] 18  (11/29 0427) BP: (91-110)/(68-75) 110/75 mmHg (11/29 0427) SpO2:  [96 %-100 %] 96 % (11/29 0711) Weight change:   Intake/Output from previous day: 11/28 0701 - 11/29 0700 In: 637.5 [I.V.:637.5] Out: 400 [Urine:400] Intake/Output this shift:    General appearance: alert, cooperative and no distress Resp: clear to auscultation bilaterally Cardio: regular rate and rhythm, S1, S2 normal, no murmur, click, rub or gallop GI: soft, non-tender; bowel sounds normal; no masses,  no organomegaly Extremities: extremities normal, atraumatic, no cyanosis or edema  Lab Results: No results found for this basename: WBC:2,HGB:2,HCT:2,PLT:2 in the last 72 hours BMET:  [facility] 11/02/12 0525 11/01/12 0519  NA 151* 148*  K 3.8 3.1*  CL 123* 122*  CO2 16* 15*  GLUCOSE 117* 114*  BUN 58* 54*  CREATININE 2.95* 2.66*  CALCIUM 8.4 8.1*   No results found for this basename: PTH:2 in the last 72 hours Iron Studies: No results found for this basename: IRON,TIBC,TRANSFERRIN,FERRITIN in the last 72 hours  Studies/Results: No results found.  I have reviewed the patient's current medications.  Assessment/Plan: Problem #1 renal failure at this moment seems to be acute his BUN is 58 and creatinine is 2.95 continued to increase. Presently seems to be oliguric was poor urine output. Problem #2 hypernatremia sodium is increasing most likely from lack of water and IV fluid with sodium bicarbonate. Problem #3 metabolic acidosis slight improvement in CO2. Problem #4 history of cancer of his esophagus Problem #5 esophagitis still has problem of  swallowing. Problem #6 malnutrition presently he is on PEG feeding  Problem #7 hypokalemia his potassium has corrected. Plan: We'll increase his sodium bicarbonate through the feeding tube. We'll increase his Lasix to 80 mg IV twice a day We'll change his IV fluid to 1/4 saline with 50 mg of sodium bicarbonate and related to present rate. We'll check his basic metabolic panel in the morning.   LOS: 9 days   Hernando Reali S 11/02/2012,8:59 AM

## 2012-11-02 NOTE — Progress Notes (Signed)
Patient has been incontinent with large watery stools throughout night.

## 2012-11-03 LAB — GLUCOSE, CAPILLARY
Glucose-Capillary: 129 mg/dL — ABNORMAL HIGH (ref 70–99)
Glucose-Capillary: 131 mg/dL — ABNORMAL HIGH (ref 70–99)

## 2012-11-03 LAB — BASIC METABOLIC PANEL
BUN: 57 mg/dL — ABNORMAL HIGH (ref 6–23)
GFR calc non Af Amer: 19 mL/min — ABNORMAL LOW (ref >90)
Glucose, Bld: 137 mg/dL — ABNORMAL HIGH (ref 70–99)
Potassium: 3.2 mEq/L — ABNORMAL LOW (ref 3.5–5.1)

## 2012-11-03 MED ORDER — FUROSEMIDE 10 MG/ML IJ SOLN
200.0000 mg | Freq: Two times a day (BID) | INTRAMUSCULAR | Status: DC
  Administered 2012-11-03 – 2012-11-04 (×3): 200 mg via INTRAVENOUS
  Filled 2012-11-03 (×4): qty 20

## 2012-11-03 MED ORDER — POTASSIUM CHLORIDE 20 MEQ/15ML (10%) PO LIQD
40.0000 meq | Freq: Two times a day (BID) | ORAL | Status: DC
  Administered 2012-11-03 – 2012-11-05 (×6): 40 meq via ORAL
  Filled 2012-11-03 (×6): qty 30

## 2012-11-03 NOTE — Progress Notes (Signed)
[facility]             Progress Note   Subjective: Diarrhea improving, swallowing improving. Able to tolerate most liquids. No shortness of breath or pain.  Objective: Vital signs in last 24 hours: Temp:  [97.2 F (36.2 C)-97.7 F (36.5 C)] 97.2 F (36.2 C) (11/30 0422) Pulse Rate:  [87-99] 87  (11/30 0422) Resp:  [18] 18  (11/30 0422) BP: (110-117)/(74-86) 117/86 mmHg (11/30 0422) SpO2:  [97 %-100 %] 98 % (11/30 0750) Weight:  [43.817 kg (96 lb 9.6 oz)] 43.817 kg (96 lb 9.6 oz) (11/30 0422) Weight change:  Last BM Date: 11/02/12  Intake/Output from previous day: 11/29 0701 - 11/30 0700 In: 4283.7 [P.O.:240; IV Piggyback:2264.7] Out: 1200 [Urine:1200]     Physical Exam: General: Alert, awake, oriented x3, in no acute distress. HEENT: No bruits, no goiter. Heart: Regular rate and rhythm, without murmurs, rubs, gallops. Lungs: Clear to auscultation bilaterally. Abdomen: Soft, nontender, nondistended, positive bowel sounds. Extremities: No clubbing cyanosis or edema with positive pedal pulses. Neuro: Grossly intact, nonfocal.    Lab Results: Basic Metabolic Panel:  Basename 11/03/12 0502 11/02/12 0525  NA 147* 151*  K 3.2* 3.8  CL 115* 123*  CO2 20 16*  GLUCOSE 137* 117*  BUN 57* 58*  CREATININE 3.19* 2.95*  CALCIUM 8.2* 8.4  MG -- --  PHOS -- 4.6   Liver Function Tests: No results found for this basename: AST:2,ALT:2,ALKPHOS:2,BILITOT:2,PROT:2,ALBUMIN:2 in the last 72 hours No results found for this basename: LIPASE:2,AMYLASE:2 in the last 72 hours No results found for this basename: AMMONIA:2 in the last 72 hours CBC: No results found for this basename: WBC:2,NEUTROABS:2,HGB:2,HCT:2,MCV:2,PLT:2 in the last 72 hours Cardiac Enzymes: No results found for this basename: CKTOTAL:3,CKMB:3,CKMBINDEX:3,TROPONINI:3 in the last 72 hours BNP: No results found for this basename: PROBNP:3 in the last 72 hours D-Dimer: No results found for this  basename: DDIMER:2 in the last 72 hours CBG:  Basename 11/03/12 1132 11/03/12 0749 11/02/12 2003 11/02/12 1657 11/01/12 2147 11/01/12 1626  GLUCAP 131* 129* 109* 118* 107* 103*   Hemoglobin A1C: No results found for this basename: HGBA1C in the last 72 hours Fasting Lipid Panel: No results found for this basename: CHOL,HDL,LDLCALC,TRIG,CHOLHDL,LDLDIRECT in the last 72 hours Thyroid Function Tests: No results found for this basename: TSH,T4TOTAL,FREET4,T3FREE,THYROIDAB in the last 72 hours Anemia Panel: No results found for this basename: VITAMINB12,FOLATE,FERRITIN,TIBC,IRON,RETICCTPCT in the last 72 hours Coagulation: No results found for this basename: LABPROT:2,INR:2 in the last 72 hours Urine Drug Screen: Drugs of Abuse  No results found for this basename: labopia,  cocainscrnur,  labbenz,  amphetmu,  thcu,  labbarb    Alcohol Level: No results found for this basename: ETH:2 in the last 72 hours Urinalysis: No results found for this basename: COLORURINE:2,APPERANCEUR:2,LABSPEC:2,PHURINE:2,GLUCOSEU:2,HGBUR:2,BILIRUBINUR:2,KETONESUR:2,PROTEINUR:2,UROBILINOGEN:2,NITRITE:2,LEUKOCYTESUR:2 in the last 72 hours  Recent Results (from the past 240 hour(s))  URINE CULTURE     Status: Normal   Collection Time   10/24/12  8:48 PM      Component Value Range Status Comment   Specimen Description URINE, CATHETERIZED   Final    Special Requests NONE   Final    Culture  Setup Time 10/25/2012 14:19   Final    Colony Count NO GROWTH   Final    Culture NO GROWTH   Final    Report Status 10/26/2012 FINAL   Final   CULTURE, BLOOD (ROUTINE X 2)     Status: Normal   Collection Time   10/25/12  1:52  AM      Component Value Range Status Comment   Specimen Description BLOOD LEFT ANTECUBITAL   Final    Special Requests     Final    Value: BOTTLES DRAWN AEROBIC AND ANAEROBIC 6CC  IMMUNE:COMPRM   Culture NO GROWTH 5 DAYS   Final    Report Status 10/30/2012 FINAL   Final   CULTURE, BLOOD (ROUTINE X  2)     Status: Normal   Collection Time   10/25/12  1:52 AM      Component Value Range Status Comment   Specimen Description BLOOD LEFT FOREARM   Final    Special Requests     Final    Value: BOTTLES DRAWN AEROBIC AND ANAEROBIC 6CC  IMMUNE:COMPRM   Culture NO GROWTH 5 DAYS   Final    Report Status 10/30/2012 FINAL   Final   STOOL CULTURE     Status: Normal   Collection Time   10/25/12 11:24 PM      Component Value Range Status Comment   Specimen Description STOOL   Final    Special Requests NONE   Final    Culture     Final    Value: NO SALMONELLA, SHIGELLA, CAMPYLOBACTER, YERSINIA, OR E.COLI 0157:H7 ISOLATED   Report Status 10/30/2012 FINAL   Final   CLOSTRIDIUM DIFFICILE BY PCR     Status: Normal   Collection Time   10/25/12 11:25 PM      Component Value Range Status Comment   C difficile by pcr NEGATIVE  NEGATIVE Final   OVA AND PARASITE EXAMINATION     Status: Normal   Collection Time   10/25/12 11:25 PM      Component Value Range Status Comment   Specimen Description STOOL   Final    Special Requests NONE   Final    Ova and parasites NO OVA OR PARASITES SEEN MODERATE WBC SEEN   Final    Report Status 10/29/2012 FINAL   Final   CULTURE, BLOOD (ROUTINE X 2)     Status: Normal   Collection Time   10/26/12 10:52 AM      Component Value Range Status Comment   Specimen Description BLOOD LEFT ARM   Final    Special Requests BOTTLES DRAWN AEROBIC AND ANAEROBIC 8CC   Final    Culture NO GROWTH 5 DAYS   Final    Report Status 10/31/2012 FINAL   Final   CULTURE, BLOOD (ROUTINE X 2)     Status: Normal   Collection Time   10/26/12 11:12 AM      Component Value Range Status Comment   Specimen Description BLOOD A-LINE DRAW DRAWN BY RN   Final    Special Requests BOTTLES DRAWN AEROBIC AND ANAEROBIC 6CC   Final    Culture NO GROWTH 5 DAYS   Final    Report Status 10/31/2012 FINAL   Final   URINE CULTURE     Status: Normal   Collection Time   10/26/12  3:39 PM      Component  Value Range Status Comment   Specimen Description URINE, RANDOM   Final    Special Requests NONE   Final    Culture  Setup Time 10/27/2012 05:34   Final    Colony Count NO GROWTH   Final    Culture NO GROWTH   Final    Report Status 10/28/2012 FINAL   Final   CLOSTRIDIUM DIFFICILE BY PCR     Status:  Normal   Collection Time   10/29/12  2:00 AM      Component Value Range Status Comment   C difficile by pcr NEGATIVE  NEGATIVE Final     Studies/Results: No results found.  Medications: Scheduled Meds:    . albuterol  2.5 mg Nebulization BID  . bismuth subsalicylate  30 mL Per Tube QID  . ciprofloxacin  500 mg Oral BID  . enoxaparin (LOVENOX) injection  30 mg Subcutaneous Q24H  . fluconazole (DIFLUCAN) IV  100 mg Intravenous Q24H  . folic acid  1 mg Per Tube Daily  . free water  100 mL Per Tube Q8H  . furosemide  200 mg Intravenous BID  . insulin aspart  0-5 Units Subcutaneous QHS  . insulin aspart  0-9 Units Subcutaneous TID WC  . magic mouthwash  5 mL Oral TID PC & HS   And  . lidocaine  5 mL Mouth/Throat TID PC & HS  . loperamide  2 mg Oral BID  . LORazepam  0.25 mg Per Tube Q8H  . metroNIDAZOLE  250 mg Per Tube TID  . multivitamin  5 mL Per Tube Daily  . ondansetron  4 mg Per Tube Q6H  . pantoprazole sodium  40 mg Per Tube BID  . potassium chloride  40 mEq Oral BID  . sodium bicarbonate  1,300 mg Oral BID  . thiamine  100 mg Per Tube Daily  . [DISCONTINUED] furosemide  80 mg Intravenous BID  . [DISCONTINUED] potassium chloride  10 mEq Per Tube BID   Continuous Infusions:    . dextrose 5 % and 0.2 % NaCl 1,000 mL with sodium bicarbonate 50 mEq infusion 75 mL/hr at 11/03/12 1425  . feeding supplement (JEVITY 1.2 CAL) 1,000 mL (11/03/12 0519)   PRN Meds:.acetaminophen, acetaminophen, albuterol, HYDROmorphone (DILAUDID) injection, LORazepam, polyethylene glycol, sodium chloride  Assessment/Plan:  Principal Problem:  *Nausea and vomiting Active Problems:  Squamous  cell carcinoma of esophagus  Thrush  Stomatitis and mucositis  Throat pain  Leukocytosis (leucocytosis)  Dehydration  Cachexia  Chemotherapy adverse reaction  Severe malnutrition  Fever  Abdominal pain, generalized  Duodenal ulcer  Helicobacter pylori (H. pylori) infection  Thrombocytopenia  Acute renal insufficiency  Hiccups  Hyperglycemia  Enteritis  Metabolic acidosis  Hypernatremia   1. Nausea and vomiting, likely multifactorial. See below. Improved.   2. Severe stomatitis and oral candidiasis secondary to chemotherapy. Clinically improving on IV fluconazole, Magic mouthwash, and viscous lidocaine.    3. Fever. Likely secondary to enteritis, resolved. Followup chest x-ray reveals no acute abnormalities. His followup urinalysis is unremarkable. Blood cultures negative to date x4. Urine culture is negative. C. difficile toxin negative. Ova and parasite exam is negative. Stool culture negative. Lower extremity ultrasound negative for DVT. He has some abdominal tenderness, but this can be attributable to his recent diagnosis of a duodenal ulcer. Broad-spectrum antibiotic treatment was started with Zosyn and vancomycin, but was discontinued when the CT scans revealed only possible enteritis. He has known H. pylori positivity. Cipro and Flagyl were started and Zosyn was discontinued. Will discontinue cipro/flagyl on 12/3 for total of 10 days.  4. Acute renal failure. Possibly due to recent medications including chemo. Renal function continues to deteriorate. Nephrology is following. Patient is non oliguric. Foley catheter was placed. Fluids are being adjusted.  He is on lasix. If renal function does not begin to improve, he may need dialysis. Continue to monitor  5. Metabolic acidosis. Likely secondary to worsening acute renal failure  versus bicarbonate losses from loose stools. Patient is on bicarbonate infusion. This is improving.  6. Duodenal ulcer with H. pylori positivity. This may  be a source of his abdominal soreness. He is followed by GI; their assessment and recommendations noted and appreciated. We'll continue twice a day PPI and defer treatment for H. pylori per GI. He was given Zosyn for several days which probably helped with some treatment in part. Flagyl and Cipro were started which will likely cover H. pylori in part. Continue PEG tube feeding.   7. Thrombocytopenia. Likely secondary to history of chemotherapy. Now resolved.   8. Loose stools. C. difficile PCR negative. Possibly related to tube feeds.  Will give imodium   9. Severe malnutrition/cachexia. Recommendations per the registered dietitian noted and appreciated. Tube feedings titrated up to 55 cc an hour.   10. Squamous cell carcinoma of the esophagus. Status post chemotherapy and radiation therapy. He is being followed by oncology. Per oncology notes, the patient is responding to chemotherapy per imaging. Plans will be to resume chemotherapy and radiation once patient is discharged from the hospital.  11. Urinary retention. Foley catheter was placed by Dr. Jerre Simon. Patient did not have a large amount of urine output. IV fluids are recommended. Once his renal function has improved, he'll undergo voiding trial. If he still can not pass his urine after foley removed, then a cystoscopy may be considered.  12. Hypernatremia.  Likely due to sodium bicarbonate.  IV fluids have been adjusted.  Continue to follow. This is improving.  13. Disposition. Patient will be transferred to a skilled nursing facility once he is medically stable.  Time spent coordinating care:   LOS: 10 days   Valda Christenson [facility] Pager: [phone] 11/03/2012, 2:58 PM

## 2012-11-03 NOTE — Progress Notes (Signed)
Subjective: Interval History: has no complaint of nausea or vomiting. Patient is still that he's able to drink coffee and other liquids. His sore throat has improved. Patient denies any difficulty breathing, orthopnea or paroxysmal nocturnal dyspnea..  Objective: Vital signs in last 24 hours: Temp:  [97.2 F (36.2 C)-97.7 F (36.5 C)] 97.2 F (36.2 C) (11/30 0422) Pulse Rate:  [87-99] 87  (11/30 0422) Resp:  [18] 18  (11/30 0422) BP: (110-117)/(74-86) 117/86 mmHg (11/30 0422) SpO2:  [97 %-100 %] 98 % (11/30 0750) Weight:  [43.817 kg (96 lb 9.6 oz)] 43.817 kg (96 lb 9.6 oz) (11/30 0422) Weight change:   Intake/Output from previous day: 11/29 0701 - 11/30 0700 In: 4283.7 [P.O.:240; IV Piggyback:2264.7] Out: 1200 [Urine:1200] Intake/Output this shift:    General appearance: alert, cooperative and no distress Resp: clear to auscultation bilaterally Cardio: regular rate and rhythm, S1, S2 normal, no murmur, click, rub or gallop GI: soft, non-tender; bowel sounds normal; no masses,  no organomegaly Extremities: extremities normal, atraumatic, no cyanosis or edema  Lab Results: No results found for this basename: WBC:2,HGB:2,HCT:2,PLT:2 in the last 72 hours BMET:  [facility] 11/03/12 0502 11/02/12 0525  NA 147* 151*  K 3.2* 3.8  CL 115* 123*  CO2 20 16*  GLUCOSE 137* 117*  BUN 57* 58*  CREATININE 3.19* 2.95*  CALCIUM 8.2* 8.4   No results found for this basename: PTH:2 in the last 72 hours Iron Studies: No results found for this basename: IRON,TIBC,TRANSFERRIN,FERRITIN in the last 72 hours  Studies/Results: No results found.  I have reviewed the patient's current medications.  Assessment/Plan: Problem #1 acute kidney injury his pedis 57 creatinine 3.19 renal function continued to decline. Presently patient is none oliguric is on diuretics but still his urine output is very small. Problem #2 hypokalemia potassium is 3.2 declining patient on potassium supplement. Problem #3  metabolic acidosis patient is on sodium bicarbonate CO2 is 20 that also has improved. Problem #4 hypernatremia sodium is 147 the seems to be also declining. Problem #5 history of, still has the esophagus Problem #6 esophagitis presently patient seems to be tolerating liquid intake. Problem #7 history of hypertension Problem #8 history of malnutrition. Plan: We'll decrease his IV fluid to 75 cc per hour We'll continue with Lasix and increase it to 200 mg IV twice a day Have discussed with him if his renal function continued to deteriorate patient may require dialysis and at this moment patient is agreeable. At this moment since patient does not have any uremic sign and  symptoms or sign of fluid overload we'll follow his blood work. If no improvement most likely will decide about dialysis the next 48 hours. We'll follow also his phosphorus level. We'll increase his potassium to 40 mEq twice a day through the feeding tube.    LOS: 10 days   Giorgio Chabot S 11/03/2012,8:14 AM

## 2012-11-04 LAB — PHOSPHORUS: Phosphorus: 4.5 mg/dL (ref 2.3–4.6)

## 2012-11-04 LAB — GLUCOSE, CAPILLARY
Glucose-Capillary: 120 mg/dL — ABNORMAL HIGH (ref 70–99)
Glucose-Capillary: 128 mg/dL — ABNORMAL HIGH (ref 70–99)
Glucose-Capillary: 115 mg/dL — ABNORMAL HIGH (ref 70–99)
Glucose-Capillary: 103 mg/dL — ABNORMAL HIGH (ref 70–99)

## 2012-11-04 LAB — BASIC METABOLIC PANEL
CO2: 22 mEq/L (ref 19–32)
Calcium: 7.8 mg/dL — ABNORMAL LOW (ref 8.4–10.5)
GFR calc Af Amer: 23 mL/min — ABNORMAL LOW (ref >90)
GFR calc non Af Amer: 20 mL/min — ABNORMAL LOW (ref >90)
Sodium: 141 mEq/L (ref 135–145)

## 2012-11-04 MED ORDER — KCL IN DEXTROSE-NACL 20-5-0.45 MEQ/L-%-% IV SOLN
INTRAVENOUS | Status: DC
  Administered 2012-11-04 – 2012-11-05 (×2): via INTRAVENOUS

## 2012-11-04 NOTE — Progress Notes (Signed)
[facility]             Progress Note   Subjective: Diarrhea improving, swallowing improving. Able to tolerate most liquids. No shortness of breath or pain.  Objective: Vital signs in last 24 hours: Temp:  [97.7 F (36.5 C)-98.2 F (36.8 C)] 97.7 F (36.5 C) (12/01 0528) Pulse Rate:  [91-92] 91  (12/01 0528) Resp:  [16] 16  (12/01 0528) BP: (90-96)/(64-68) 90/64 mmHg (12/01 0528) SpO2:  [99 %-100 %] 100 % (12/01 0748) Weight change:  Last BM Date: 11/03/12  Intake/Output from previous day: 11/30 0701 - 12/01 0700 In: 5014.2 [I.V.:2830.2] Out: 0      Physical Exam: General: Alert, awake, oriented x3, in no acute distress. HEENT: No bruits, no goiter. Heart: Regular rate and rhythm, without murmurs, rubs, gallops. Lungs: Clear to auscultation bilaterally. Abdomen: Soft, nontender, nondistended, positive bowel sounds. Extremities: No clubbing cyanosis or edema with positive pedal pulses. Neuro: Grossly intact, nonfocal.    Lab Results: Basic Metabolic Panel:  Basename 11/04/12 0615 11/03/12 0502 11/02/12 0525  NA 141 147* --  K 3.1* 3.2* --  CL 108 115* --  CO2 22 20 --  GLUCOSE 117* 137* --  BUN 48* 57* --  CREATININE 3.10* 3.19* --  CALCIUM 7.8* 8.2* --  MG -- -- --  PHOS 4.5 -- 4.6   Liver Function Tests: No results found for this basename: AST:2,ALT:2,ALKPHOS:2,BILITOT:2,PROT:2,ALBUMIN:2 in the last 72 hours No results found for this basename: LIPASE:2,AMYLASE:2 in the last 72 hours No results found for this basename: AMMONIA:2 in the last 72 hours CBC: No results found for this basename: WBC:2,NEUTROABS:2,HGB:2,HCT:2,MCV:2,PLT:2 in the last 72 hours Cardiac Enzymes: No results found for this basename: CKTOTAL:3,CKMB:3,CKMBINDEX:3,TROPONINI:3 in the last 72 hours BNP: No results found for this basename: PROBNP:3 in the last 72 hours D-Dimer: No results found for this basename: DDIMER:2 in the last 72 hours CBG:  Basename 11/04/12 1149  11/04/12 0729 11/03/12 2116 11/03/12 1640 11/03/12 1132 11/03/12 0749  GLUCAP 103* 115* 128* 120* 131* 129*   Hemoglobin A1C: No results found for this basename: HGBA1C in the last 72 hours Fasting Lipid Panel: No results found for this basename: CHOL,HDL,LDLCALC,TRIG,CHOLHDL,LDLDIRECT in the last 72 hours Thyroid Function Tests: No results found for this basename: TSH,T4TOTAL,FREET4,T3FREE,THYROIDAB in the last 72 hours Anemia Panel: No results found for this basename: VITAMINB12,FOLATE,FERRITIN,TIBC,IRON,RETICCTPCT in the last 72 hours Coagulation: No results found for this basename: LABPROT:2,INR:2 in the last 72 hours Urine Drug Screen: Drugs of Abuse  No results found for this basename: labopia,  cocainscrnur,  labbenz,  amphetmu,  thcu,  labbarb    Alcohol Level: No results found for this basename: ETH:2 in the last 72 hours Urinalysis: No results found for this basename: COLORURINE:2,APPERANCEUR:2,LABSPEC:2,PHURINE:2,GLUCOSEU:2,HGBUR:2,BILIRUBINUR:2,KETONESUR:2,PROTEINUR:2,UROBILINOGEN:2,NITRITE:2,LEUKOCYTESUR:2 in the last 72 hours  Recent Results (from the past 240 hour(s))  STOOL CULTURE     Status: Normal   Collection Time   10/25/12 11:24 PM      Component Value Range Status Comment   Specimen Description STOOL   Final    Special Requests NONE   Final    Culture     Final    Value: NO SALMONELLA, SHIGELLA, CAMPYLOBACTER, YERSINIA, OR E.COLI 0157:H7 ISOLATED   Report Status 10/30/2012 FINAL   Final   CLOSTRIDIUM DIFFICILE BY PCR     Status: Normal   Collection Time   10/25/12 11:25 PM      Component Value Range Status Comment   C difficile by pcr NEGATIVE  NEGATIVE  Final   OVA AND PARASITE EXAMINATION     Status: Normal   Collection Time   10/25/12 11:25 PM      Component Value Range Status Comment   Specimen Description STOOL   Final    Special Requests NONE   Final    Ova and parasites NO OVA OR PARASITES SEEN MODERATE WBC SEEN   Final    Report Status  10/29/2012 FINAL   Final   CULTURE, BLOOD (ROUTINE X 2)     Status: Normal   Collection Time   10/26/12 10:52 AM      Component Value Range Status Comment   Specimen Description BLOOD LEFT ARM   Final    Special Requests BOTTLES DRAWN AEROBIC AND ANAEROBIC 8CC   Final    Culture NO GROWTH 5 DAYS   Final    Report Status 10/31/2012 FINAL   Final   CULTURE, BLOOD (ROUTINE X 2)     Status: Normal   Collection Time   10/26/12 11:12 AM      Component Value Range Status Comment   Specimen Description BLOOD A-LINE DRAW DRAWN BY RN   Final    Special Requests BOTTLES DRAWN AEROBIC AND ANAEROBIC 6CC   Final    Culture NO GROWTH 5 DAYS   Final    Report Status 10/31/2012 FINAL   Final   URINE CULTURE     Status: Normal   Collection Time   10/26/12  3:39 PM      Component Value Range Status Comment   Specimen Description URINE, RANDOM   Final    Special Requests NONE   Final    Culture  Setup Time 10/27/2012 05:34   Final    Colony Count NO GROWTH   Final    Culture NO GROWTH   Final    Report Status 10/28/2012 FINAL   Final   CLOSTRIDIUM DIFFICILE BY PCR     Status: Normal   Collection Time   10/29/12  2:00 AM      Component Value Range Status Comment   C difficile by pcr NEGATIVE  NEGATIVE Final     Studies/Results: No results found.  Medications: Scheduled Meds:    . albuterol  2.5 mg Nebulization BID  . bismuth subsalicylate  30 mL Per Tube QID  . ciprofloxacin  500 mg Oral BID  . enoxaparin (LOVENOX) injection  30 mg Subcutaneous Q24H  . fluconazole (DIFLUCAN) IV  100 mg Intravenous Q24H  . folic acid  1 mg Per Tube Daily  . free water  100 mL Per Tube Q8H  . furosemide  200 mg Intravenous BID  . insulin aspart  0-5 Units Subcutaneous QHS  . insulin aspart  0-9 Units Subcutaneous TID WC  . magic mouthwash  5 mL Oral TID PC & HS   And  . lidocaine  5 mL Mouth/Throat TID PC & HS  . loperamide  2 mg Oral BID  . LORazepam  0.25 mg Per Tube Q8H  . metroNIDAZOLE  250 mg  Per Tube TID  . multivitamin  5 mL Per Tube Daily  . ondansetron  4 mg Per Tube Q6H  . pantoprazole sodium  40 mg Per Tube BID  . potassium chloride  40 mEq Oral BID  . sodium bicarbonate  1,300 mg Oral BID  . thiamine  100 mg Per Tube Daily   Continuous Infusions:    . dextrose 5 % and 0.2 % NaCl 1,000 mL with sodium bicarbonate  50 mEq infusion 75 mL/hr at 11/04/12 1130  . feeding supplement (JEVITY 1.2 CAL) 1,000 mL (11/03/12 2359)   PRN Meds:.acetaminophen, acetaminophen, albuterol, HYDROmorphone (DILAUDID) injection, LORazepam, polyethylene glycol, sodium chloride  Assessment/Plan:  Principal Problem:  *Nausea and vomiting Active Problems:  Squamous cell carcinoma of esophagus  Thrush  Stomatitis and mucositis  Throat pain  Leukocytosis (leucocytosis)  Dehydration  Cachexia  Chemotherapy adverse reaction  Severe malnutrition  Fever  Abdominal pain, generalized  Duodenal ulcer  Helicobacter pylori (H. pylori) infection  Thrombocytopenia  Acute renal insufficiency  Hiccups  Hyperglycemia  Enteritis  Metabolic acidosis  Hypernatremia   1. Nausea and vomiting, likely multifactorial. See below. Improved.   2. Severe stomatitis and oral candidiasis secondary to chemotherapy. Clinically improving on IV fluconazole, Magic mouthwash, and viscous lidocaine. He is tolerating liquids without difficulty now.  He does not wish to try solid foods at this time.   3. Fever. Likely secondary to enteritis, resolved. Followup chest x-ray reveals no acute abnormalities. His followup urinalysis is unremarkable. Blood cultures negative to date x4. Urine culture is negative. C. difficile toxin negative. Ova and parasite exam is negative. Stool culture negative. Lower extremity ultrasound negative for DVT. He has some abdominal tenderness, but this can be attributed to his recent diagnosis of a duodenal ulcer. Broad-spectrum antibiotic treatment was started with Zosyn and vancomycin, but  was discontinued when the CT scans revealed only possible enteritis. He has known H. pylori positivity. Cipro and Flagyl were started and Zosyn was discontinued. Will discontinue cipro/flagyl on 12/3 for total of 10 days.  4. Acute renal failure. Possibly due to recent medications including chemo. Renal function may have started to plateau. Nephrology is following. Patient is non oliguric. Foley catheter was placed. Fluids are being adjusted.  He is on lasix. Continue to monitor  5. Metabolic acidosis. Likely secondary to worsening acute renal failure versus bicarbonate losses from loose stools. Patient is on bicarbonate infusion. This is improving.  6. Duodenal ulcer with H. pylori positivity. This may be a source of his abdominal soreness. He is followed by GI; their assessment and recommendations noted and appreciated. We'll continue twice a day PPI and defer treatment for H. pylori per GI. He was given Zosyn for several days which probably helped with some treatment in part. Flagyl and Cipro were started which will likely cover H. pylori in part. Continue PEG tube feeding.   7. Thrombocytopenia. Likely secondary to history of chemotherapy. Now resolved.   8. Loose stools. C. difficile PCR negative. Possibly related to tube feeds.  Improving with imodium.   9. Severe malnutrition/cachexia. Recommendations per the registered dietitian noted and appreciated. Tube feedings titrated up to 55 cc an hour.   10. Squamous cell carcinoma of the esophagus. Status post chemotherapy and radiation therapy. He is being followed by oncology. Per oncology notes, the patient is responding to chemotherapy per imaging. Plans will be to resume chemotherapy and radiation once patient is discharged from the hospital.  11. Urinary retention. Foley catheter was placed by Dr. Jerre Simon. Patient did not initially have a large amount of urine output indicating significant urinary retention. IV fluids were recommended. Once his  renal function has improved, he'll need to undergo a voiding trial. If he still can not pass his urine after foley is removed, then a cystoscopy may be considered.  12. Hypernatremia.  Likely due to sodium bicarbonate.  IV fluids have been adjusted.  Continue to follow. This is improving.  13. Disposition. Patient will  be transferred to a skilled nursing facility once he is medically stable. Currently we are awaiting for further stabilization/improvement in his renal function. Once his renal function has improved, he will need to have foley catheter removed for voiding trial.  Time spent coordinating care:   LOS: 11 days   Eddie [name] [facility] Pager: [phone] 11/04/2012, 12:50 PM

## 2012-11-04 NOTE — Progress Notes (Signed)
Subjective: Interval History: has no complaint of nausea or vomiting. He is able to tolerate liquid diet. Patient denies at this moment any difficulty breathing. His appetite is not that great and he doesn't have any abdominal pain..  Objective: Vital signs in last 24 hours: Temp:  [97.7 F (36.5 C)-98.2 F (36.8 C)] 97.7 F (36.5 C) (12/01 0528) Pulse Rate:  [91-92] 91  (12/01 0528) Resp:  [16] 16  (12/01 0528) BP: (90-96)/(64-68) 90/64 mmHg (12/01 0528) SpO2:  [99 %-100 %] 100 % (12/01 0748) Weight change:   Intake/Output from previous day: 11/30 0701 - 12/01 0700 In: 5014.2 [I.V.:2830.2] Out: 0  Intake/Output this shift:    General appearance: alert, cooperative and no distress Resp: clear to auscultation bilaterally Cardio: regular rate and rhythm, S1, S2 normal, no murmur, click, rub or gallop GI: soft, non-tender; bowel sounds normal; no masses,  no organomegaly Extremities: extremities normal, atraumatic, no cyanosis or edema  Lab Results: No results found for this basename: WBC:2,HGB:2,HCT:2,PLT:2 in the last 72 hours BMET:  [facility] 11/04/12 0615 11/03/12 0502  NA 141 147*  K 3.1* 3.2*  CL 108 115*  CO2 22 20  GLUCOSE 117* 137*  BUN 48* 57*  CREATININE 3.10* 3.19*  CALCIUM 7.8* 8.2*   No results found for this basename: PTH:2 in the last 72 hours Iron Studies: No results found for this basename: IRON,TIBC,TRANSFERRIN,FERRITIN in the last 72 hours  Studies/Results: No results found.  I have reviewed the patient's current medications.  Assessment/Plan: Problem #1 renal failure at this moment acute on chronic his BUN is 48 creatinine is 3.10 renal function seems to be stabilizing. Presently he is none oliguric and urine output seems to be improving. Problem #2 hypernatremia sodium is 1.1 has corrected Problem #3 hypokalemia patient is on potassium supplement potassium 3.1 seems to be declining most likely secondary to diuretics and renal loss. Problem #4  metabolic acidosis that has corrected. Problem #5 history of, cell cancer of the esophagus status post chemoradiation therapy Problem #6 history of esophagitis Problem #7 history of poor nutrition patient presently with PEG  placement .he was put on PEG feeding Problem #8 metabolic bone disease his calcium and phosphorus is was in acceptable range. Plan: We'll change his IV fluid to D5 water wheeze 20 mEq of KCl at 125 cc per hour. We'll check his basic metabolic panel and the we'll follow his blood work. At this moment if his renal function recovers patient may not requiring dialysis.   LOS: 11 days   Felecia Stanfill S 11/04/2012,12:57 PM

## 2012-11-05 LAB — BASIC METABOLIC PANEL
BUN: 43 mg/dL — ABNORMAL HIGH (ref 6–23)
CO2: 21 mEq/L (ref 19–32)
Chloride: 107 mEq/L (ref 96–112)
Creatinine, Ser: 2.76 mg/dL — ABNORMAL HIGH (ref 0.50–1.35)
GFR calc Af Amer: 26 mL/min — ABNORMAL LOW (ref >90)
Glucose, Bld: 123 mg/dL — ABNORMAL HIGH (ref 70–99)
Potassium: 4 mEq/L (ref 3.5–5.1)

## 2012-11-05 LAB — GLUCOSE, CAPILLARY
Glucose-Capillary: 119 mg/dL — ABNORMAL HIGH (ref 70–99)
Glucose-Capillary: 95 mg/dL (ref 70–99)
Glucose-Capillary: 96 mg/dL (ref 70–99)
Glucose-Capillary: 117 mg/dL — ABNORMAL HIGH (ref 70–99)

## 2012-11-05 LAB — CBC
HCT: 28.3 % — ABNORMAL LOW (ref 39.0–52.0)
Hemoglobin: 9.6 g/dL — ABNORMAL LOW (ref 13.0–17.0)
MCHC: 33.9 g/dL (ref 30.0–36.0)
MCV: 95 fL (ref 78.0–100.0)

## 2012-11-05 LAB — PHOSPHORUS: Phosphorus: 3.3 mg/dL (ref 2.3–4.6)

## 2012-11-05 MED ORDER — FUROSEMIDE 10 MG/ML IJ SOLN
100.0000 mg | Freq: Two times a day (BID) | INTRAVENOUS | Status: DC
  Administered 2012-11-05 (×2): 100 mg via INTRAVENOUS
  Filled 2012-11-05 (×3): qty 10

## 2012-11-05 MED ORDER — SODIUM CHLORIDE 0.45 % IV SOLN
INTRAVENOUS | Status: DC
  Administered 2012-11-05 – 2012-11-07 (×4): via INTRAVENOUS

## 2012-11-05 NOTE — Progress Notes (Signed)
Subjective: Interval History: has no complaint of difficulty in breathing. His appetite is improving so as his soar throat. Presently he doesn't have any nausea or vomiting..  Objective: Vital signs in last 24 hours: Temp:  [97.7 F (36.5 C)-98.2 F (36.8 C)] 97.7 F (36.5 C) (12/02 0417) Pulse Rate:  [85-104] 104  (12/02 0417) Resp:  [16] 16  (12/02 0417) BP: (100-106)/(70-72) 100/70 mmHg (12/02 0417) SpO2:  [98 %-100 %] 98 % (12/02 0651) Weight:  [44.453 kg (98 lb)] 44.453 kg (98 lb) (12/02 0417) Weight change:   Intake/Output from previous day: 12/01 0701 - 12/02 0700 In: 3655.7 [I.V.:2016.7] Out: 1250 [Urine:1250] Intake/Output this shift:    General appearance: alert, cooperative and no distress Resp: clear to auscultation bilaterally Cardio: regular rate and rhythm, S1, S2 normal, no murmur, click, rub or gallop GI: soft, non-tender; bowel sounds normal; no masses,  no organomegaly Extremities: extremities normal, atraumatic, no cyanosis or edema  Lab Results:  [facility] [place] 11/05/12 0549  WBC 8.6  HGB 9.6*  HCT 28.3*  PLT 183   BMET:  Basename 11/05/12 0549 11/04/12 0615  NA 139 141  K 4.0 3.1*  CL 107 108  CO2 21 22  GLUCOSE 123* 117*  BUN 43* 48*  CREATININE 2.76* 3.10*  CALCIUM 7.5* 7.8*   No results found for this basename: PTH:2 in the last 72 hours Iron Studies: No results found for this basename: IRON,TIBC,TRANSFERRIN,FERRITIN in the last 72 hours  Studies/Results: No results found.  I have reviewed the patient's current medications.  Assessment/Plan: Problem #1 acute kidney injury his BUN is 43 creatinine is 2.76 renal function is improving progressively. Presently patient is none oliguric. Problem #2 hypokalemia potassium is 4 has corrected his on IV fluid with potassium. Problem #3 anemia his hemoglobin 9.6 hematocrit 28.3. Problem #4 hypernatremia sodium has normalized Problem #5 history of esophagitis presently seems to be feeling  better. Problem #6 history of cancer of the esophagus Problem #7 poor nutrition. Plan: DC IV fluids potassium Increased patient for by mouth fluid intake and through the PEG. Normal saline at 75 cc per hour. Check his basic metabolic panel and CBC in the morning and also will do iron studies.    LOS: 12 days   Tarez Bowns S 11/05/2012,7:45 AM

## 2012-11-05 NOTE — Progress Notes (Signed)
[facility]             Progress Note   Subjective: The patient denies abdominal pain. He says that he is swallowing better. He has less mouth pain. He is able to drink clear liquids without too much difficulty.  Objective: Vital signs in last 24 hours: Temp:  [97.7 F (36.5 C)-98.5 F (36.9 C)] 98.5 F (36.9 C) (12/02 1400) Pulse Rate:  [85-104] 86  (12/02 1400) Resp:  [16] 16  (12/02 1400) BP: (96-106)/(68-72) 96/68 mmHg (12/02 1400) SpO2:  [98 %-100 %] 100 % (12/02 1400) Weight:  [44.453 kg (98 lb)] 44.453 kg (98 lb) (12/02 0417) Weight change:  Last BM Date: 11/05/12  Intake/Output from previous day: 12/01 0701 - 12/02 0700 In: 3655.7 [I.V.:2016.7] Out: 1250 [Urine:1250] Total I/O In: 160 [Other:100; IV Piggyback:60] Out: 800 [Urine:800]   Physical Exam: General: Cachectic debilitated African-American man laying in bed, in no acute distress. Oral pharynx: Significantly fewer ulcerations, near resolution of thrush. Heart: S1, S2, no murmurs rubs or gallops. Lungs: Clear to auscultation bilaterally. Abdomen: PEG tube in place. Positive bowel sounds, soft, nontender, nondistended. Extremities: No pedal edema.. Neuro: Grossly intact, nonfocal.    Lab Results: Basic Metabolic Panel:  Basename 11/05/12 0549 11/04/12 0615  NA 139 141  K 4.0 3.1*  CL 107 108  CO2 21 22  GLUCOSE 123* 117*  BUN 43* 48*  CREATININE 2.76* 3.10*  CALCIUM 7.5* 7.8*  MG -- --  PHOS 3.3 4.5   Liver Function Tests: No results found for this basename: AST:2,ALT:2,ALKPHOS:2,BILITOT:2,PROT:2,ALBUMIN:2 in the last 72 hours No results found for this basename: LIPASE:2,AMYLASE:2 in the last 72 hours No results found for this basename: AMMONIA:2 in the last 72 hours CBC:  Basename 11/05/12 0549  WBC 8.6  NEUTROABS --  HGB 9.6*  HCT 28.3*  MCV 95.0  PLT 183   Cardiac Enzymes: No results found for this basename: CKTOTAL:3,CKMB:3,CKMBINDEX:3,TROPONINI:3 in the last 72  hours BNP: No results found for this basename: PROBNP:3 in the last 72 hours D-Dimer: No results found for this basename: DDIMER:2 in the last 72 hours CBG:  Basename 11/05/12 1630 11/05/12 1110 11/05/12 0730 11/04/12 2004 11/04/12 1631 11/04/12 1149  GLUCAP 96 95 119* 117* 114* 103*   Hemoglobin A1C: No results found for this basename: HGBA1C in the last 72 hours Fasting Lipid Panel: No results found for this basename: CHOL,HDL,LDLCALC,TRIG,CHOLHDL,LDLDIRECT in the last 72 hours Thyroid Function Tests: No results found for this basename: TSH,T4TOTAL,FREET4,T3FREE,THYROIDAB in the last 72 hours Anemia Panel: No results found for this basename: VITAMINB12,FOLATE,FERRITIN,TIBC,IRON,RETICCTPCT in the last 72 hours Coagulation: No results found for this basename: LABPROT:2,INR:2 in the last 72 hours Urine Drug Screen: Drugs of Abuse  No results found for this basename: labopia,  cocainscrnur,  labbenz,  amphetmu,  thcu,  labbarb    Alcohol Level: No results found for this basename: ETH:2 in the last 72 hours Urinalysis: No results found for this basename: COLORURINE:2,APPERANCEUR:2,LABSPEC:2,PHURINE:2,GLUCOSEU:2,HGBUR:2,BILIRUBINUR:2,KETONESUR:2,PROTEINUR:2,UROBILINOGEN:2,NITRITE:2,LEUKOCYTESUR:2 in the last 72 hours  Recent Results (from the past 240 hour(s))  CLOSTRIDIUM DIFFICILE BY PCR     Status: Normal   Collection Time   10/29/12  2:00 AM      Component Value Range Status Comment   C difficile by pcr NEGATIVE  NEGATIVE Final     Studies/Results: No results found.  Medications: Scheduled Meds:    . albuterol  2.5 mg Nebulization BID  . bismuth subsalicylate  30 mL Per Tube QID  . ciprofloxacin  500 mg  Oral BID  . enoxaparin (LOVENOX) injection  30 mg Subcutaneous Q24H  . fluconazole (DIFLUCAN) IV  100 mg Intravenous Q24H  . folic acid  1 mg Per Tube Daily  . free water  100 mL Per Tube Q8H  . furosemide  100 mg Intravenous BID  . insulin aspart  0-5 Units  Subcutaneous QHS  . insulin aspart  0-9 Units Subcutaneous TID WC  . magic mouthwash  5 mL Oral TID PC & HS   And  . lidocaine  5 mL Mouth/Throat TID PC & HS  . loperamide  2 mg Oral BID  . LORazepam  0.25 mg Per Tube Q8H  . metroNIDAZOLE  250 mg Per Tube TID  . multivitamin  5 mL Per Tube Daily  . ondansetron  4 mg Per Tube Q6H  . pantoprazole sodium  40 mg Per Tube BID  . potassium chloride  40 mEq Oral BID  . sodium bicarbonate  1,300 mg Oral BID  . thiamine  100 mg Per Tube Daily  . [DISCONTINUED] furosemide  200 mg Intravenous BID   Continuous Infusions:    . sodium chloride 40 mL/hr at 11/05/12 1422  . feeding supplement (JEVITY 1.2 CAL) 1,000 mL (11/04/12 1839)  . [DISCONTINUED] dextrose 5 % and 0.45 % NaCl with KCl 20 mEq/L 100 mL/hr at 11/05/12 0606   PRN Meds:.acetaminophen, acetaminophen, albuterol, HYDROmorphone (DILAUDID) injection, LORazepam, polyethylene glycol, sodium chloride  Assessment/Plan:  Principal Problem:  *Nausea and vomiting Active Problems:  Squamous cell carcinoma of esophagus  Thrush  Stomatitis and mucositis  Throat pain  Leukocytosis (leucocytosis)  Dehydration  Cachexia  Chemotherapy adverse reaction  Severe malnutrition  Fever  Abdominal pain, generalized  Duodenal ulcer  Helicobacter pylori (H. pylori) infection  Thrombocytopenia  Acute renal insufficiency  Hiccups  Hyperglycemia  Enteritis  Metabolic acidosis  Hypernatremia   1. Nausea and vomiting, likely multifactorial. See below. Improved.   2. Severe stomatitis and oral candidiasis secondary to chemotherapy. Clinically improving on IV fluconazole, Magic mouthwash, and viscous lidocaine. He is tolerating liquids without difficulty now.  He does not wish to try solid foods at this time. We'll consider changing fluconazole to per tube in the next day or 2..   3. Fever. Likely secondary to enteritis, resolved. Followup chest x-ray reveals no acute abnormalities. His followup  urinalysis is unremarkable. Blood cultures negative to date x4. Urine culture is negative. C. difficile toxin negative. Ova and parasite exam is negative. Stool culture negative. Lower extremity ultrasound negative for DVT. He has had some abdominal tenderness, but this can be attributed to his recent diagnosis of a duodenal ulcer. Broad-spectrum antibiotic treatment was started with Zosyn and vancomycin, but was discontinued when the CT scans revealed only possible enteritis. He has known H. pylori positivity. Cipro and Flagyl were started and Zosyn was discontinued. Will discontinue cipro/flagyl on 12/3 for total of 10 days.  4. Acute renal failure. Possibly due to recent medications including chemo. Renal function is improving slowly. Nephrology is following. Patient is non oliguric. Foley catheter was placed. Fluids are being adjusted.  He is on lasix. Continue to monitor  5. Metabolic acidosis. Now resolved. Likely secondary to worsening acute renal failure versus bicarbonate losses from loose stools. Patient was on a bicarbonate infusion.  6. Duodenal ulcer with H. pylori positivity. This may be a source of his abdominal soreness. He is followed by GI; their assessment and recommendations noted and appreciated. We'll continue twice a day PPI and defer treatment for  H. pylori per GI. He was given Zosyn for several days which probably helped with some treatment in part. Flagyl and Cipro were started which will likely cover H. pylori in part. Continue PEG tube feeding.   7. Thrombocytopenia. Likely secondary to history of chemotherapy. Now resolved.   8. Loose stools. C. difficile PCR negative. Possibly related to tube feeds.  Improving with imodium.   9. Severe malnutrition/cachexia. Recommendations per the registered dietitian noted and appreciated. Tube feedings titrated up to 55 cc an hour. A clear liquid diet has been ordered.  10. Squamous cell carcinoma of the esophagus. Status post  chemotherapy and radiation therapy. He is being followed by oncology. Per oncology notes, the patient is responding to chemotherapy per imaging. Plans will be to resume chemotherapy and radiation once patient is discharged from the hospital.  11. Urinary retention. Foley catheter was placed by Dr. Jerre Simon. Patient did not initially have a large amount of urine output indicating significant urinary retention. IV fluids were recommended. Once his renal function has improved, he'll need to undergo a voiding trial. If he still can not pass his urine after foley is removed, then a cystoscopy may be considered.  12. Hypernatremia.  Likely due to sodium bicarbonate.  IV fluids have been adjusted.  Continue to follow. Resolved for now.  13. Disposition. Patient will be transferred to a skilled nursing facility once he is medically stable. Currently we are awaiting for further stabilization/improvement in his renal function. Once his renal function has improved, he will need to have foley catheter removed for voiding trial.     LOS: 12 days   Avrey Hyser [facility] Pager: [phone] 11/05/2012, 5:49 PM

## 2012-11-05 NOTE — Progress Notes (Signed)
Physical Therapy Treatment Patient Details Name: Eddie [name] MRN: [id number] DOB: 18-Aug-1946 Today's Date: 11/05/2012 Time: 1109-1130 PT Time Calculation (min): 21 min 1 therex 1 gt  PT Assessment / Plan / Recommendation Comments on Treatment Session  Patient agreeable to PT today and did well with all bed exercises, no rest breaks needed. Patient also completed 73' of gait training with RW; Min A; no LOB Just past room door patient became SOB/fatigued and request to turn around. Patient agreed to sit up in recliner as long as tolerated.                                                    Mobility  Bed Mobility Sit to Supine: 6: Modified independent (Device/Increase time) Transfers Stand to Sit: 6: Modified independent (Device/Increase time) Details for Transfer Assistance: verbal cues for proper hand placement Ambulation/Gait Ambulation Distance (Feet): 24 Feet Assistive device: Rolling walker Gait Pattern: Narrow base of support;Step-through pattern;Decreased step length - left;Decreased step length - right Gait velocity: decreased General Gait Details: Patient became SOB just past door and requested to turn around Stairs: No Wheelchair Mobility Wheelchair Mobility: No    Exercises General Exercises - Lower Extremity Ankle Circles/Pumps: AAROM;15 reps Quad Sets: 10 reps;Both Gluteal Sets: 10 reps Short Arc Quad: 10 reps;Both Long Arc Quad: Both;10 reps Heel Slides: Both;10 reps Hip ABduction/ADduction: 10 reps;Both Straight Leg Raises: Both;10 reps   PT Diagnosis:    PT Problem List:   PT Treatment Interventions:     PT Goals    Visit Information  Last PT Received On: 11/05/12    Subjective Data  Subjective: I will do what I can   Cognition       Balance     End of Session PT - End of Session Equipment Utilized During Treatment: Gait belt Activity Tolerance: Patient tolerated treatment well;Patient limited by fatigue Patient left: in chair;with  call bell/phone within reach;with chair alarm set;with family/visitor present   GP     Freida Nebel ATKINSO 11/05/2012, 11:46 AM

## 2012-11-05 NOTE — Clinical Social Work Note (Signed)
CSW continuing to follow. Family requesting [facility] to reevaluate. CSW called facility and awaiting response.  Derenda Fennel, [place] [phone]

## 2012-11-05 NOTE — Progress Notes (Signed)
OT Cancellation Note  Patient Details Name: TRENTON PASSOW MRN: [id number] DOB: 02-11-1946   Cancelled Treatment:    Reason Eval/Treat Not Completed: Fatigue/lethargy limiting ability to participate. Patient sleeping upon therapy arrival. Patient requested to not participate in tx session this PM and wait until tomorrow secondary to fatigue.  Limmie Patricia, OTR/L 11/05/2012, 3:01 PM

## 2012-11-05 NOTE — Progress Notes (Signed)
Nutrition Follow-up  Intervention:  If MD ok for pt to have clear liquids; please enter Diet order and nutrition services will provide clear liquid tray. Thank you.  Assessment:  Pt is progressing per MD notes. Tolerating  Continuous Jevity 1.2 @55ml /hr. Current TF rate providing: 1584 kcal (36kcal/kg), 73 gr protein (1.6 gr/kg), 1065 ml water from formula. Flushes 300 ml free water/day. Adequate to meet >90% current estimated energy and protein needs.   Diet Order: Pt says MD ok for him to sip/drink orange juice and soft drinks.   IVF-1/4 NS @ 75 ml/hr continuous rate  Meds: Scheduled Meds:   . albuterol  2.5 mg Nebulization BID  . bismuth subsalicylate  30 mL Per Tube QID  . ciprofloxacin  500 mg Oral BID  . enoxaparin (LOVENOX) injection  30 mg Subcutaneous Q24H  . fluconazole (DIFLUCAN) IV  100 mg Intravenous Q24H  . folic acid  1 mg Per Tube Daily  . free water  100 mL Per Tube Q8H  . furosemide  100 mg Intravenous BID  . insulin aspart  0-5 Units Subcutaneous QHS  . insulin aspart  0-9 Units Subcutaneous TID WC  . magic mouthwash  5 mL Oral TID PC & HS   And  . lidocaine  5 mL Mouth/Throat TID PC & HS  . loperamide  2 mg Oral BID  . LORazepam  0.25 mg Per Tube Q8H  . metroNIDAZOLE  250 mg Per Tube TID  . multivitamin  5 mL Per Tube Daily  . ondansetron  4 mg Per Tube Q6H  . pantoprazole sodium  40 mg Per Tube BID  . potassium chloride  40 mEq Oral BID  . sodium bicarbonate  1,300 mg Oral BID  . thiamine  100 mg Per Tube Daily  . [DISCONTINUED] furosemide  200 mg Intravenous BID   Continuous Infusions:   . sodium chloride 75 mL/hr at 11/05/12 0858  . feeding supplement (JEVITY 1.2 CAL) 1,000 mL (11/04/12 1839)  . [DISCONTINUED] dextrose 5 % and 0.2 % NaCl 1,000 mL with sodium bicarbonate 50 mEq infusion 75 mL/hr at 11/04/12 1130  . [DISCONTINUED] dextrose 5 % and 0.45 % NaCl with KCl 20 mEq/L 100 mL/hr at 11/05/12 0606   PRN Meds:.acetaminophen, acetaminophen,  albuterol, HYDROmorphone (DILAUDID) injection, LORazepam, polyethylene glycol, sodium chloride   CMP     Component Value Date/Time   NA 139 11/05/2012 0549   K 4.0 11/05/2012 0549   CL 107 11/05/2012 0549   CO2 21 11/05/2012 0549   GLUCOSE 123* 11/05/2012 0549   BUN 43* 11/05/2012 0549   CREATININE 2.76* 11/05/2012 0549   CREATININE 1.12 07/30/2012 0950   CALCIUM 7.5* 11/05/2012 0549   PROT 4.7* 10/28/2012 0622   ALBUMIN 1.9* 10/28/2012 0622   AST 9 10/28/2012 0622   ALT 6 10/28/2012 0622   ALKPHOS 38* 10/28/2012 0622   BILITOT 0.7 10/28/2012 0622   GFRNONAA 23* 11/05/2012 0549   GFRAA 26* 11/05/2012 0549    CBG (last 3)   Basename 11/05/12 0730 11/04/12 2004 11/04/12 1631  GLUCAP 119* 117* 114*     Intake/Output Summary (Last 24 hours) at 11/05/12 1043 Last data filed at 11/05/12 0907  Gross per 24 hour  Intake 2735.66 ml  Output   1250 ml  Net 1485.66 ml     Weight Status:12/2  98# (44.4kg) Wt 11/29- 94.1# (42.7 kg)  Wt 11/26 -96.4# (43.7kg)  Wt 11/25 105# (47.6)   Re-estimated needs: 1504-1739 kcal, 75-94 gr protein daily  NUTRITION DIAGNOSIS:  -Inadequate oral intake (NI-2.1). Status: Ongoing   RELATED TO: esophageal carcinoma   AS EVIDENCE BY: Non-volitional wt loss hx, NPO /Clear Liquids status, increased nutrition needs with catabolic illness   [phone]

## 2012-11-06 LAB — CBC
HCT: 25.2 % — ABNORMAL LOW (ref 39.0–52.0)
Hemoglobin: 8.6 g/dL — ABNORMAL LOW (ref 13.0–17.0)
MCH: 32.7 pg (ref 26.0–34.0)
MCHC: 34.1 g/dL (ref 30.0–36.0)

## 2012-11-06 LAB — BASIC METABOLIC PANEL
BUN: 40 mg/dL — ABNORMAL HIGH (ref 6–23)
Chloride: 106 mEq/L (ref 96–112)
Creatinine, Ser: 2.44 mg/dL — ABNORMAL HIGH (ref 0.50–1.35)
Glucose, Bld: 102 mg/dL — ABNORMAL HIGH (ref 70–99)
Potassium: 4.6 mEq/L (ref 3.5–5.1)

## 2012-11-06 LAB — FERRITIN: Ferritin: 1155 ng/mL — ABNORMAL HIGH (ref 22–322)

## 2012-11-06 LAB — GLUCOSE, CAPILLARY: Glucose-Capillary: 103 mg/dL — ABNORMAL HIGH (ref 70–99)

## 2012-11-06 LAB — PHOSPHORUS: Phosphorus: 2.5 mg/dL (ref 2.3–4.6)

## 2012-11-06 MED ORDER — CHLORHEXIDINE GLUCONATE 0.12 % MT SOLN
15.0000 mL | Freq: Two times a day (BID) | OROMUCOSAL | Status: DC
  Administered 2012-11-06 – 2012-11-07 (×3): 15 mL via OROMUCOSAL
  Filled 2012-11-06 (×3): qty 15

## 2012-11-06 MED ORDER — BIOTENE DRY MOUTH MT LIQD
15.0000 mL | Freq: Two times a day (BID) | OROMUCOSAL | Status: DC
  Administered 2012-11-06 – 2012-11-07 (×4): 15 mL via OROMUCOSAL

## 2012-11-06 MED ORDER — BOOST / RESOURCE BREEZE PO LIQD
1.0000 | Freq: Two times a day (BID) | ORAL | Status: DC
  Administered 2012-11-07 (×2): 1 via ORAL

## 2012-11-06 MED ORDER — FREE WATER
50.0000 mL | Freq: Three times a day (TID) | Status: DC
  Administered 2012-11-06 – 2012-11-07 (×3): 50 mL

## 2012-11-06 MED ORDER — FLUCONAZOLE 100 MG PO TABS: 100.0000 mg | ORAL_TABLET | Freq: Every day | ORAL | Status: DC

## 2012-11-06 NOTE — Progress Notes (Signed)
[facility]             Progress Note   Subjective: The patient denies abdominal pain. He says that he is swallowing better. He has less mouth pain. He is able to drink clear liquids without too much difficulty.  Objective: Vital signs in last 24 hours: Temp:  [98.3 F (36.8 C)-99.1 F (37.3 C)] 99.1 F (37.3 C) (12/03 1335) Pulse Rate:  [80-88] 80  (12/03 1335) Resp:  [16-19] 18  (12/03 1335) BP: (90-104)/(55-71) 104/62 mmHg (12/03 1335) SpO2:  [99 %-100 %] 100 % (12/03 1335) Weight change:  Last BM Date: 11/05/12  Intake/Output from previous day: 12/02 0701 - 12/03 0700 In: 927.1 [P.O.:120; I.V.:587.1; IV Piggyback:120] Out: 2300 [Urine:2300] Total I/O In: 2218 [P.O.:840; Other:1378] Out: -    Physical Exam: General: Cachectic debilitated African-American man laying in bed, in no acute distress. Oral pharynx: Significantly fewer ulcerations, near resolution of thrush. Heart: S1, S2, no murmurs rubs or gallops. Lungs: Clear to auscultation bilaterally. Abdomen: PEG tube in place. Positive bowel sounds, soft, nontender, nondistended. Extremities: No pedal edema.. Neuro: Grossly intact, nonfocal.    Lab Results: Basic Metabolic Panel:  Basename 11/06/12 0508 11/05/12 0549  NA 137 139  K 4.6 4.0  CL 106 107  CO2 23 21  GLUCOSE 102* 123*  BUN 40* 43*  CREATININE 2.44* 2.76*  CALCIUM 7.5* 7.5*  MG -- --  PHOS 2.5 3.3   Liver Function Tests: No results found for this basename: AST:2,ALT:2,ALKPHOS:2,BILITOT:2,PROT:2,ALBUMIN:2 in the last 72 hours No results found for this basename: LIPASE:2,AMYLASE:2 in the last 72 hours No results found for this basename: AMMONIA:2 in the last 72 hours CBC:  Basename 11/06/12 0508 11/05/12 0549  WBC 8.9 8.6  NEUTROABS -- --  HGB 8.6* 9.6*  HCT 25.2* 28.3*  MCV 95.8 95.0  PLT 188 183   Cardiac Enzymes: No results found for this basename: CKTOTAL:3,CKMB:3,CKMBINDEX:3,TROPONINI:3 in the last 72  hours BNP: No results found for this basename: PROBNP:3 in the last 72 hours D-Dimer: No results found for this basename: DDIMER:2 in the last 72 hours CBG:  Basename 11/06/12 1127 11/06/12 0732 11/05/12 2108 11/05/12 1630 11/05/12 1110 11/05/12 0730  GLUCAP 111* 103* 117* 96 95 119*   Hemoglobin A1C: No results found for this basename: HGBA1C in the last 72 hours Fasting Lipid Panel: No results found for this basename: CHOL,HDL,LDLCALC,TRIG,CHOLHDL,LDLDIRECT in the last 72 hours Thyroid Function Tests: No results found for this basename: TSH,T4TOTAL,FREET4,T3FREE,THYROIDAB in the last 72 hours Anemia Panel: No results found for this basename: VITAMINB12,FOLATE,FERRITIN,TIBC,IRON,RETICCTPCT in the last 72 hours Coagulation: No results found for this basename: LABPROT:2,INR:2 in the last 72 hours Urine Drug Screen: Drugs of Abuse  No results found for this basename: labopia,  cocainscrnur,  labbenz,  amphetmu,  thcu,  labbarb    Alcohol Level: No results found for this basename: ETH:2 in the last 72 hours Urinalysis: No results found for this basename: COLORURINE:2,APPERANCEUR:2,LABSPEC:2,PHURINE:2,GLUCOSEU:2,HGBUR:2,BILIRUBINUR:2,KETONESUR:2,PROTEINUR:2,UROBILINOGEN:2,NITRITE:2,LEUKOCYTESUR:2 in the last 72 hours  Recent Results (from the past 240 hour(s))  CLOSTRIDIUM DIFFICILE BY PCR     Status: Normal   Collection Time   10/29/12  2:00 AM      Component Value Range Status Comment   C difficile by pcr NEGATIVE  NEGATIVE Final     Studies/Results: No results found.  Medications: Scheduled Meds:    . albuterol  2.5 mg Nebulization BID  . antiseptic oral rinse  15 mL Mouth Rinse q12n4p  . bismuth subsalicylate  30 mL  Per Tube QID  . chlorhexidine  15 mL Mouth Rinse BID  . ciprofloxacin  500 mg Oral BID  . enoxaparin (LOVENOX) injection  30 mg Subcutaneous Q24H  . fluconazole (DIFLUCAN) IV  100 mg Intravenous Q24H  . folic acid  1 mg Per Tube Daily  . free water  100  mL Per Tube Q8H  . insulin aspart  0-5 Units Subcutaneous QHS  . insulin aspart  0-9 Units Subcutaneous TID WC  . magic mouthwash  5 mL Oral TID PC & HS   And  . lidocaine  5 mL Mouth/Throat TID PC & HS  . loperamide  2 mg Oral BID  . LORazepam  0.25 mg Per Tube Q8H  . metroNIDAZOLE  250 mg Per Tube TID  . multivitamin  5 mL Per Tube Daily  . ondansetron  4 mg Per Tube Q6H  . pantoprazole sodium  40 mg Per Tube BID  . sodium bicarbonate  1,300 mg Oral BID  . thiamine  100 mg Per Tube Daily  . [DISCONTINUED] furosemide  100 mg Intravenous BID  . [DISCONTINUED] potassium chloride  40 mEq Oral BID   Continuous Infusions:    . sodium chloride 75 mL/hr at 11/06/12 0245  . feeding supplement (JEVITY 1.2 CAL) 1,000 mL (11/06/12 0920)   PRN Meds:.acetaminophen, acetaminophen, albuterol, HYDROmorphone (DILAUDID) injection, LORazepam, polyethylene glycol, sodium chloride  Assessment/Plan:  Principal Problem:  *Nausea and vomiting Active Problems:  Squamous cell carcinoma of esophagus  Thrush  Stomatitis and mucositis  Throat pain  Leukocytosis (leucocytosis)  Dehydration  Cachexia  Chemotherapy adverse reaction  Severe malnutrition  Fever  Abdominal pain, generalized  Duodenal ulcer  Helicobacter pylori (H. pylori) infection  Thrombocytopenia  Acute renal insufficiency  Hiccups  Hyperglycemia  Enteritis  Metabolic acidosis  Hypernatremia   1. Nausea and vomiting, likely multifactorial. See below. Improved.   2. Severe stomatitis and oral candidiasis secondary to chemotherapy. Clinically improving on IV fluconazole, Magic mouthwash, and viscous lidocaine. He is tolerating liquids without difficulty now.  He does not wish to try solid foods at this time. We'll consider changing fluconazole to per tube in the next day or 2..   3. Fever. Likely secondary to enteritis, resolved. Followup chest x-ray reveals no acute abnormalities. His followup urinalysis is unremarkable.  Blood cultures negative to date x4. Urine culture is negative. C. difficile toxin negative. Ova and parasite exam is negative. Stool culture negative. Lower extremity ultrasound negative for DVT. He has had some abdominal tenderness, but this can be attributed to his recent diagnosis of a duodenal ulcer. Broad-spectrum antibiotic treatment was started with Zosyn and vancomycin, but was discontinued when the CT scans revealed only possible enteritis. He has known H. pylori positivity. Cipro and Flagyl were started and Zosyn was discontinued. Will discontinue cipro/flagyl at the end of today for total of 10 days.  4. Acute renal failure. Possibly due to recent medications including chemo. Renal function is improving slowly. Nephrology is following. Patient is non oliguric. Foley catheter was placed. Fluids are being adjusted.  He is on lasix. Continue to monitor  5. Metabolic acidosis. Now resolved. Likely secondary to worsening acute renal failure versus bicarbonate losses from loose stools. Patient was on a bicarbonate infusion.  6. Duodenal ulcer with H. pylori positivity. This may be a source of his abdominal soreness. He is followed by GI; their assessment and recommendations noted and appreciated. We'll continue twice a day PPI and defer treatment for H. pylori per GI. He  was given Zosyn for several days which probably helped with some treatment in part. Flagyl and Cipro were started which will likely cover H. pylori in part. Continue PEG tube feeding.   7. Thrombocytopenia. Likely secondary to history of chemotherapy. Now resolved.   8. Loose stools. C. difficile PCR negative. Possibly related to tube feeds.  Improving with imodium.   9. Severe malnutrition/cachexia. Recommendations per the registered dietitian noted and appreciated. Tube feedings titrated up to 55 cc an hour. A clear liquid diet has been ordered.  10. Squamous cell carcinoma of the esophagus. Status post chemotherapy and  radiation therapy. He is being followed by oncology. Per oncology notes, the patient is responding to chemotherapy per imaging. Plans will be to resume chemotherapy and radiation once patient is discharged from the hospital.  11. Urinary retention. Foley catheter was placed by Dr. Jerre Simon. Patient did not initially have a large amount of urine output indicating significant urinary retention. IV fluids were recommended. Once his renal function has improved, he'll need to undergo a voiding trial. If he still can not pass his urine after foley is removed, then a cystoscopy may be considered. Will discontinue Foley catheter today for voiding trial.  12. Hypernatremia.  Likely due to sodium bicarbonate.  IV fluids have been adjusted.  Continue to follow. Resolved for now.  13. Disposition. Patient will be transferred to a skilled nursing facility once he is medically stable. Currently we are awaiting for further stabilization/improvement in his renal function. Consider discharge the skilled nursing facility over the next 24-48 hours. Followup with oncology will be arranged as noted by Mr. Jacalyn Lefevre.     LOS: 13 days   Tamre Cass [facility] Pager: [phone] 11/06/2012, 1:40 PM

## 2012-11-06 NOTE — Progress Notes (Signed)
Physical Therapy Treatment Patient Details Name: Eddie [name] MRN: [id number] DOB: 08-03-1946 Today's Date: 11/06/2012 Time: 1445-1510 PT Time Calculation (min): 25 min  PT Assessment / Plan / Recommendation Comments on Treatment Session  Pt tolerates tx very well. Pt presents with improved activity tolerance this session. Pt requires no assistance with bed exercises. Pt requests to return to bed at end of session rather than recliner.                      Plan  (Continue to progress per PT POC.)           Mobility  Transfers Transfers: Sit to Stand;Stand to Sit Sit to Stand: 6: Modified independent (Device/Increase time);From bed Stand to Sit: To bed Ambulation/Gait Ambulation/Gait Assistance: 6: Modified independent (Device/Increase time) Ambulation Distance (Feet): 180 Feet Assistive device: Rolling walker Ambulation/Gait Assistance Details: Pt presents with improved activity tolerance for gait training.  Gait Pattern: Narrow base of support;Decreased stride length;Trunk flexed    Exercises General Exercises - Lower Extremity Ankle Circles/Pumps: 10 reps;AROM Gluteal Sets: 10 reps Short Arc Quad: 10 reps;Both Long Arc Quad: Both;10 reps Heel Slides: Both;10 reps Hip ABduction/ADduction: 10 reps;Both Straight Leg Raises: Both;10 reps   Visit Information  Last PT Received On: 11/06/12    Subjective Data  Subjective: I don't know about walking but I'll do some exercises.         End of Session PT - End of Session Equipment Utilized During Treatment: Gait belt Activity Tolerance: Patient tolerated treatment well Patient left: with call bell/phone within reach;in bed;with bed alarm set   Seth Bake, PTA 11/06/2012, 3:23 PM

## 2012-11-06 NOTE — Progress Notes (Signed)
Subjective: Eddie [name] is seen lying in bed.  His spirits are much improved.  He is talkative today which is a significant improvement since he was seen last by oncology.  He reports that he is consuming PO liquids and is tolerating it well.  His mouth pain is significantly improved with nearly no discomfort.  He denies any abdominal pain.  He denies nausea, and vomiting.  Objective: Vital signs in last 24 hours: Temp:  [98.3 F (36.8 C)-98.5 F (36.9 C)] 98.3 F (36.8 C) (12/03 0531) Pulse Rate:  [80-88] 88  (12/03 0531) Resp:  [16-19] 18  (12/03 0531) BP: (90-102)/(55-71) 90/55 mmHg (12/03 0531) SpO2:  [99 %-100 %] 100 % (12/03 0801)  Intake/Output from previous day: 12/02 0800 - 12/03 0759 In: 927.1 [P.O.:120; I.V.:587.1; IV Piggyback:120] Out: 2300 [Urine:2300] Intake/Output this shift:    General appearance: alert, appears older than stated age, no distress and much improved appearing Resp: clear to auscultation bilaterally Cardio: regular rate and rhythm, S1, S2 normal, no murmur, click, rub or gallop GI: soft, non-tender; bowel sounds normal; no masses,  no organomegaly Extremities: extremities normal, atraumatic, no cyanosis or edema  Lab Results:   [facility] 11/06/12 0508 11/05/12 0549  WBC 8.9 8.6  HGB 8.6* 9.6*  HCT 25.2* 28.3*  PLT 188 183   BMET  Basename 11/06/12 0508 11/05/12 0549  NA 137 139  K 4.6 4.0  CL 106 107  CO2 23 21  GLUCOSE 102* 123*  BUN 40* 43*  CREATININE 2.44* 2.76*  CALCIUM 7.5* 7.5*    Studies/Results: No results found.  Medications: I have reviewed the patient's current medications.  Assessment/Plan: 1. Acute Renal injury, BUN 40 and Creatinine 2.44, improving.  2. Anemia, secondary to #1.  Will check anemia panel 3. Stomatitis and oral candidiasis, much improved, nearly resolved.  4. Squamous cell carcinoma of esophagus, S/P 3 cycles of 5 FU 400 mg/m2/day (starting on 10/11/2012) and concomitant radiation by Dr. Thersa Salt. 5 FU  stopped on admission. This cancer is potentially curable. Interval regression of esophageal mass on CT of chest. Regression is significant on review of CT. 6. Cachexia with malnutrition 7. Strongly encourage re-initiation of chemoradiation when patient is discharged and recovered. Patient will require daily transportation for radiation and weekly transportation to [facility] for pump changes. Chemotherapy has been reduced by 50% and will be restarted with radiation when patient is fit for more therapy.  8. Please copy Oncology on discharge note and we can help facilitate arranging radiation and chemotherapy with rehabilitation facility.     LOS: 13 days    KEFALAS,THOMAS 11/06/2012

## 2012-11-06 NOTE — Progress Notes (Signed)
Subjective: Interval History: has no complaint of nausea or vomiting. Patient has this moment is feeling better. He denies any.  Objective: Vital signs in last 24 hours: Temp:  [98.3 F (36.8 C)-98.5 F (36.9 C)] 98.3 F (36.8 C) (12/03 0531) Pulse Rate:  [80-88] 88  (12/03 0531) Resp:  [16-19] 18  (12/03 0531) BP: (90-102)/(55-71) 90/55 mmHg (12/03 0531) SpO2:  [99 %-100 %] 100 % (12/03 0801) Weight change:   Intake/Output from previous day: 12/02 0701 - 12/03 0700 In: 927.1 [P.O.:120; I.V.:587.1; IV Piggyback:120] Out: 2300 [Urine:2300] Intake/Output this shift:    General appearance: alert, cooperative and no distress Resp: clear to auscultation bilaterally Cardio: regular rate and rhythm, S1, S2 normal, no murmur, click, rub or gallop GI: soft, non-tender; bowel sounds normal; no masses,  no organomegaly Extremities: extremities normal, atraumatic, no cyanosis or edema  Lab Results:  [facility] 11/06/12 0508 11/05/12 0549  WBC 8.9 8.6  HGB 8.6* 9.6*  HCT 25.2* 28.3*  PLT 188 183   BMET:  Basename 11/06/12 0508 11/05/12 0549  NA 137 139  K 4.6 4.0  CL 106 107  CO2 23 21  GLUCOSE 102* 123*  BUN 40* 43*  CREATININE 2.44* 2.76*  CALCIUM 7.5* 7.5*   No results found for this basename: PTH:2 in the last 72 hours Iron Studies: No results found for this basename: IRON,TIBC,TRANSFERRIN,FERRITIN in the last 72 hours  Studies/Results: No results found.  I have reviewed the patient's current medications.  Assessment/Plan: Problem #1 acute kidney injury his BUN is 40 creatinine is 2.44 renal function is progressively improving. Presently he is none oliguric. Problem #2 hypokalemia is on potassium supplement potassium is 4.6 has corrected. Problem #3 anemia his hemoglobin is 8.6 hematocrit 25.2 declining. Problem #4 history of cancer of his esophagus status post chemotherapy Problem #5 history of esophagitis/stomatitis presently seems to be tolerating clear liquid  diet. Pain is also improving. Problem #6 history of malnutrition Problem #7 history of duodenal ulcer Problem #8 history of hypertension blood pressure is good. Plan: We'll DC potassium supplement Continue his other medications as before. We'll follow patient when his  Is discharged.    LOS: 13 days   Braylynn Lewing S 11/06/2012,8:10 AM

## 2012-11-07 ENCOUNTER — Inpatient Hospital Stay (HOSPITAL_COMMUNITY): Payer: Self-pay

## 2012-11-07 DIAGNOSIS — D649 Anemia, unspecified: Secondary | ICD-10-CM | POA: Diagnosis present

## 2012-11-07 LAB — BASIC METABOLIC PANEL
BUN: 31 mg/dL — ABNORMAL HIGH (ref 6–23)
CO2: 23 mEq/L (ref 19–32)
Chloride: 104 mEq/L (ref 96–112)
Creatinine, Ser: 1.81 mg/dL — ABNORMAL HIGH (ref 0.50–1.35)

## 2012-11-07 LAB — CBC
Hemoglobin: 7.9 g/dL — ABNORMAL LOW (ref 13.0–17.0)
MCH: 32.4 pg (ref 26.0–34.0)
MCV: 96.7 fL (ref 78.0–100.0)
RBC: 2.44 MIL/uL — ABNORMAL LOW (ref 4.22–5.81)

## 2012-11-07 MED ORDER — LIDOCAINE VISCOUS 2 % MT SOLN: 5.0000 mL | Freq: Three times a day (TID) | OROMUCOSAL | Status: DC | PRN

## 2012-11-07 MED ORDER — SODIUM CHLORIDE 0.9 % IV SOLN
INTRAVENOUS | Status: DC
  Administered 2012-11-07: 11:00:00 via INTRAVENOUS

## 2012-11-07 MED ORDER — FREE WATER
250.0000 mL | Freq: Four times a day (QID) | Status: DC
  Administered 2012-11-07: 250 mL

## 2012-11-07 MED ORDER — FLUCONAZOLE 100 MG PO TABS
100.0000 mg | ORAL_TABLET | Freq: Every day | ORAL | Status: DC
Start: 2012-11-07 — End: 2012-11-07
  Administered 2012-11-07: 100 mg
  Filled 2012-11-07: qty 1

## 2012-11-07 MED ORDER — INSULIN GLARGINE 100 UNIT/ML ~~LOC~~ SOLN: 7.0000 [IU] | Freq: Two times a day (BID) | SUBCUTANEOUS | Status: DC

## 2012-11-07 MED ORDER — FOLIC ACID 1 MG PO TABS: 1.0000 mg | ORAL_TABLET | Freq: Every day | ORAL | Status: DC

## 2012-11-07 MED ORDER — THIAMINE HCL 100 MG PO TABS: 100.0000 mg | ORAL_TABLET | Freq: Every day | ORAL | Status: DC

## 2012-11-07 MED ORDER — ALBUTEROL SULFATE (5 MG/ML) 0.5% IN NEBU: 2.5000 mg | INHALATION_SOLUTION | Freq: Two times a day (BID) | RESPIRATORY_TRACT | Status: DC

## 2012-11-07 MED ORDER — TERAZOSIN HCL 1 MG PO CAPS: 1.0000 mg | ORAL_CAPSULE | Freq: Every day | ORAL | Status: DC

## 2012-11-07 MED ORDER — ADULT MULTIVITAMIN LIQUID CH: 5.0000 mL | Freq: Every day | ORAL | Status: DC

## 2012-11-07 MED ORDER — ONDANSETRON HCL 8 MG PO TABS: 8.0000 mg | ORAL_TABLET | Freq: Two times a day (BID) | ORAL | Status: DC | PRN

## 2012-11-07 MED ORDER — FREE WATER: 250.0000 mL | Freq: Four times a day (QID) | Status: DC

## 2012-11-07 MED ORDER — OXYCODONE HCL 5 MG/5ML PO SOLN: 5.0000 mg | ORAL | Status: DC | PRN

## 2012-11-07 MED ORDER — LOPERAMIDE HCL 2 MG PO CAPS: 2.0000 mg | ORAL_CAPSULE | Freq: Two times a day (BID) | ORAL | Status: DC

## 2012-11-07 MED ORDER — PANTOPRAZOLE SODIUM 40 MG PO PACK: 40.0000 mg | PACK | Freq: Two times a day (BID) | ORAL | Status: DC

## 2012-11-07 MED ORDER — LORAZEPAM 0.5 MG PO TABS: 0.2500 mg | ORAL_TABLET | Freq: Three times a day (TID) | ORAL | Status: DC

## 2012-11-07 MED ORDER — FLUCONAZOLE 40 MG/ML PO SUSR: 100.0000 mg | Freq: Every day | ORAL | Status: DC | PRN

## 2012-11-07 MED ORDER — SODIUM BICARBONATE 650 MG PO TABS: 1300.0000 mg | ORAL_TABLET | Freq: Every day | ORAL | Status: DC

## 2012-11-07 MED ORDER — JEVITY 1.2 CAL PO LIQD: 1000.0000 mL | ORAL | Status: DC

## 2012-11-07 MED ORDER — TERAZOSIN HCL 1 MG PO CAPS
1.0000 mg | ORAL_CAPSULE | ORAL | Status: AC
  Administered 2012-11-07: 1 mg
  Filled 2012-11-07: qty 1

## 2012-11-07 MED ORDER — MAGIC MOUTHWASH: 5.0000 mL | Freq: Three times a day (TID) | ORAL | Status: DC | PRN

## 2012-11-07 NOTE — Clinical Social Work Note (Signed)
Pt to d/c this afternoon to [facility]. CSW spoke with pt and pt's sister-in-law who are agreeable. Facility notified and aware of foley. Pt will need follow up with Dr. Jerre Simon and Dr. Mariel Sleet. Included on FL2. Pt to transfer with sister-in-law. RN to complete d/c with CSW off campus.   Derenda Fennel, [place] [phone]

## 2012-11-07 NOTE — Clinical Social Work Placement (Signed)
Clinical Social Work Department CLINICAL SOCIAL WORK PLACEMENT NOTE 11/07/2012  Patient:  Eddie [name], Eddie [name]  Account Number:  [id number] Admit date:  10/24/2012  Clinical Social Worker:  Derenda Fennel, LCSW  Date/time:  10/29/2012 09:20 AM  Clinical Social Work is seeking post-discharge placement for this patient at the following level of care:   SKILLED NURSING   (*CSW will update this form in Epic as items are completed)   10/29/2012  Patient/family provided with [facility] Department of Clinical Social Work's list of facilities offering this level of care within the geographic area requested by the patient (or if unable, by the patient's family).  10/29/2012  Patient/family informed of their freedom to choose among providers that offer the needed level of care, that participate in Medicare, Medicaid or managed care program needed by the patient, have an available bed and are willing to accept the patient.  10/29/2012  Patient/family informed of [facility]' ownership interest in [facility], as well as of the fact that they are under no obligation to receive care at this facility.  PASARR submitted to EDS on 10/29/2012 PASARR number received from [employer] on 10/29/2012  FL2 transmitted to all facilities in geographic area requested by pt/family on  10/29/2012 FL2 transmitted to all facilities within larger geographic area on   Patient informed that his/her managed care company has contracts with or will negotiate with  certain facilities, including the following:     Patient/family informed of bed offers received:  10/30/2012 Patient chooses bed at [facility] [place] Physician recommends and patient chooses bed at  [facility] [place]  Patient to be transferred to [facility] [place] on  11/07/2012 Patient to be transferred to facility by family  The following physician request were entered in Epic:   Additional Comments:  Derenda Fennel, LCSW [phone]

## 2012-11-07 NOTE — Progress Notes (Signed)
Patient voided 100cc of urine into urinal. Post residual urine checked 235cc per bladder scanner.

## 2012-11-07 NOTE — Progress Notes (Signed)
Patient voided 100cc of urine into the urinal. Post-void residual was 208 mL according to bladder scanner. MD paged to make aware.

## 2012-11-07 NOTE — Progress Notes (Signed)
14 french foley catheter inserted per MD order. 200cc yellow urine returned. Pt tolerated well.

## 2012-11-07 NOTE — Progress Notes (Signed)
Report called to [place] at [facility]. Caregiver verbalized understanding of report. Pt to be transferred via family to facility.

## 2012-11-07 NOTE — Discharge Summary (Signed)
Physician Discharge Summary  Eddie [name]:[id number] DOB: 1946-02-05 DOA: 10/24/2012  PCP: Milinda Antis, MD  Admit date: 10/24/2012 Discharge date: 11/07/2012  Time spent: Greater than 30 minutes  Recommendations for Outpatient Follow-up:  1. The patient will be scheduled for chemotherapy and radiation therapy per oncologist Dr. Mariel Sleet. 2. The patient should be scheduled to followup with Dr. Jerre Simon in 2 weeks for followup of urinary retention. Continue Foley catheter care at the skilled nursing facility. 3. The patient will followup with Dr. Kristian Covey as scheduled on 12/12/2012. 4. The patient should have his blood sugars checked twice a day.  Discharge Diagnoses:  1. Nausea and vomiting, etiology multifactorial. See below. 2. Severe stomatitis and oral candidiasis secondary to chemotherapy. 3. Acute enteritis. 4. Duodenal ulcer with H. pylori positivity. Completed treatment for H. pylori. 5. Squamous cell carcinoma of the esophagus. 6. Acute renal failure, etiology multifactorial including urinary retention, dehydration, and possibly chemotherapeutic agents. 7. Metabolic acidosis secondary to acute renal failure and bicarbonate losses from loose stools. 8. Anemia, secondary to acute and chronic illnesses and chemotherapy. 9. Thrombocytopenia, secondary to chemotherapy. Resolved. 10. Hyperglycemia secondary to dextrose and IV fluids and PEG tube feedings. 11. Loose stools, thought to be secondary to tube feedings. C. difficile PCR negative. 12. Hypernatremia secondary to dehydration. 13. Urinary retention. The patient was discharged with indwelling Foley catheter. 14. Severe malnutrition and cachexia. 15. Hiccups, resolved. 16. Chronic dysphagia secondary to esophageal cancer. History of PEG tube insertion.  Discharge Condition: Improved.  Diet recommendation: Clear liquid diet.  Filed Weights   10/30/12 1448 11/03/12 0422 11/05/12 0417  Weight: 43.8 kg (96 lb 9 oz) 43.817  kg (96 lb 9.6 oz) 44.453 kg (98 lb)    History of present illness:  The patient is a 66 year old man with a history significant for recently diagnosed advanced squamous cell carcinoma of the esophagus (undergoing radiation therapy and chemotherapy) , hypertension, previous alcoholism, and previous tobacco abuse, who presented to the emergency department on 10/25/2012 with a chief complaint of intractable nausea and vomiting. He also complained of severe mouth pain. In the emergency department, he was afebrile and hemodynamically stable. His lab data were significant for a BUN of 51, glucose 122, WBC of 17.7, and platelet count of 128. His chest x-ray revealed changes of COPD but no acute abnormalities. He was admitted for further evaluation and management.  Hospital Course:  The patient was started on IV fluconazole for oral thrush and esophagitis. He was also started on IV Protonix for his recent history of duodenal ulcer. Magic mouthwash and viscous lidocaine was ordered 4 times daily for mucositis and stomatitis. As needed IV hydromorphone was ordered for pain. IV fluids were started for IV hydration. Zofran was scheduled for nausea and vomiting. Ativan was added as well to help treat his nausea. PEG tube feedings were temporarily withheld.    Severe stomatitis/mucositis/oral candidiasis. The patient was started on Magic mouthwash, viscous lidocaine, and IV fluconazole. His symptomatology was thought to be secondary to chemotherapy. Therefore, the oncology team which consisted of Dr. Mariel Sleet and PA Mr. Jacalyn Lefevre were consulted. They agreed with medical management, but increased the dose of IV Zofran. Chemotherapy was temporarily discontinued. Over time, the patient's stomatitis, mucositis, and oral candidiasis decreased significantly and nearly resolved. He received 2 weeks of treatment with medications as stated. He was discharged on when necessary doses. Following the resolution of his mouth pain,  clear liquids were started which he tolerated well. No advancement in his diet was  recommended. He was happy with clear liquids at the time of discharge.  Duodenal ulcer/H. pylori positivity/enteritis/loose stools. The patient complained of epigastric abdominal pain during the first week of the hospitalization. He was diagnosed with a duodenal ulcer in October during the time that he was diagnosed with esophageal cancer. Gastroenterologist, Dr. Karilyn Cota was consulted. He agreed with continued treatment with twice a day dosing of Protonix. He delayed treatment of H. pylori gastritis until the patient was clinically improved. Dr. Karilyn Cota ordered stool studies because the patient complained of loose stools. C. difficile PCR, O&P, and stool culture were all negative. He developed a fever. Zosyn was started empirically for persistent fever. For further evaluation of his fever and loose stools, CT scan of his abdomen and pelvis was ordered. The CT was remarkable only for enteritis. Therefore Zosyn was discontinued in favor of Cipro and Flagyl. Eventually, Pepto-Bismol (bismuth subsalicylate) was added for treatment of H. pylori infection. The patient completed antibiotic therapy during the two-week course of the hospitalization. His loose stools decreased, but he was started on Imodium which helped.  Severe malnutrition. The patient was eventually started on trickle to feedings. The registered dietitian was consulted. She made recommendations regarding the rate of tube feedings. In addition, multivitamins were added via the PEG tube daily. Eventually, a clear liquid diet was started when his mouth pain resolved. He will be maintained on clear liquids and PEG tube feedings for nutrition. His diet should not be advanced until cleared by oncology or gastroenterology.   Squamous cell carcinoma of the esophagus.  Dr. Mariel Sleet and Mr. Jacalyn Lefevre followed the patient throughout the hospitalization. Chemotherapy was  temporarily placed on hold. The patient was status post 3 cycles of 5-FU and concomitant radiation. During the workup of fever, a CT scan of the patient's chest revealed interval regression of the esophageal mass which was encouraging. Upon discharge, they recommended reinitiation of chemoradiation. Chemotherapy will be reduced by 50%. They will make arrangements for the patient to continue therapy in the outpatient setting. [facility] will arrange transportation.   Acute renal failure/electrolyte abnormality/metabolic acidosis/urinary retention. The patient was given an indwelling Foley catheter shortly after admission. The Foley catheter was eventually discontinued. Prior to the discontinuation of Foley catheter, the patient's creatinine began to increase. His acute renal failure was thought to be multifactorial in etiology, including dehydration, acute illnesses, urinary retention, and possibly chemotherapy. The creatinine continued to increase. The patient was having difficulty emptying his bladder following the discontinuation of the Foley catheter. Therefore the Foley catheter was reinserted by urologist Dr. Jerre Simon. He recommended keeping the Foley catheter inserted until the patient's renal function improved. Nephrologist, Dr. Kristian Covey was consulted. He adjusted the patient's IV fluids and started Lasix. He also started bicarbonate supplementation when the patient's CO2 decreased to the acidotic range. Lasix was eventually discontinued. During the entire hospitalization, the patient's urine output was excellent, following the reinitiation of the Foley catheter. His creatinine reached a high of 3.19. At the time of discharge, it was 1.81. His CO2 normalized to 23. A voiding trial was started prior to hospital discharge. He was able to urinate spontaneously, however, he continued to have residuals of over 200 cc of urine. Therefore, Dr. Jerre Simon recommended that the Foley catheter be  reinserted. I took the liberty of starting Hytrin at 1 mg each bedtime. The Foley catheter will be left in. The patient will need to followup with Dr. Jerre Simon in 2 weeks. He will followup with Dr. Kristian Covey as scheduled  in January.  Anemia and thrombocytopenia. Both were thought to be secondary to previous chemotherapy. His platelet count improved and normalized, however, his hemoglobin continued to remain low. It was 7.9 at the time of discharge. There was no evidence of GI or GU bleeding during the hospitalization. His hemoglobin and platelet count will be followed in the outpatient setting by the oncology team.  Hyperglycemia. The patient's hyperglycemia was thought to be secondary to dextrose in the IV fluids and to feedings. His capillary blood glucose was monitored before each meal and at bedtime. The elevations were treated with sliding scale NovoLog. For the most part, his capillary blood glucose was well controlled. Therefore, he was discharged on twice a day dosing of Lantus. His capillary blood glucose will need to be assessed at least twice daily at the nursing facility.  Procedures:    Consultations:  Oncologist, Dr. Mariel Sleet  Urologist, Dr. Jerre Simon  Nephrologist, Dr. Kristian Covey  Discharge Exam: Ceasar Mons Vitals:   11/06/12 2045 11/07/12 0623 11/07/12 0710 11/07/12 1325  BP: 99/63 97/57  92/56  Pulse: 83 81  85  Temp: 99 F (37.2 C) 98.8 F (37.1 C)  98.4 F (36.9 C)  TempSrc: Oral Oral  Oral  Resp: 18 20  20   Height:      Weight:      SpO2: 100% 100% 100% 100%    General: Alert 65 year old African-American man sitting up in bed, in no acute distress. Oropharynx: Near resolution of oral mucosal ulcerations. No evidence of oral thrush.  Cardiovascular: S1, S2, with no murmurs rubs or gallops.  Respiratory: Clear to auscultation bilaterally. Abdomen: PEG tube in place. Bowel sounds present. Nontender, nondistended.  Discharge Instructions      Discharge Orders     Future Appointments: Provider: Department: Dept Phone: Center:   11/13/2012 1:00 PM Salley Scarlet, MD [facility] [phone] [facility]   11/14/2012 9:45 AM [facility] Team [facility] [phone] None   11/21/2012 9:30 AM [facility] Chair 7 [facility] [phone] None   11/26/2012 12:30 PM Randall An, MD [facility] [phone] None     Future Orders Please Complete By Expires   Diet - low sodium heart healthy      Increase activity slowly      Discharge instructions      Comments:   The Foley catheter should be left in until he is reevaluated by Dr. Jerre Simon in his office in 2 weeks.       Medication List     As of 11/07/2012  3:44 PM    STOP taking these medications         docusate 50 MG/5ML liquid   Commonly known as: COLACE      FIRST-DUKES MOUTHWASH Susp      prochlorperazine 10 MG tablet   Commonly known as: COMPAZINE      prochlorperazine 25 MG suppository   Commonly known as: COMPAZINE      TAKE these medications         albuterol (5 MG/ML) 0.5% nebulizer solution   Commonly known as: PROVENTIL   Take 0.5 mLs (2.5 mg total) by nebulization 2 (two) times daily.      feeding supplement (JEVITY 1.2 CAL) Liqd   Place 1,000 mLs into feeding tube continuous. Run at 55 cc an hour. Give 250 cc of free water via PEG tube every 6 hours.      fluconazole 40 MG/ML suspension   Commonly known as:  DIFLUCAN   Place 2.5 mLs (100 mg total) into feeding tube daily as needed (For oral thrush).      folic acid 1 MG tablet   Commonly known as: FOLVITE   Place 1 tablet (1 mg total) into feeding tube daily.      free water Soln   Place 250 mLs into feeding tube every 6 (six) hours.      insulin glargine 100 UNIT/ML injection   Commonly known as: LANTUS   Inject 7 Units into the skin 2 (two) times daily.      lidocaine 2 % solution   Commonly known as: XYLOCAINE   Take 5 mLs by mouth 3 (three) times daily between meals  as needed for pain (For mouth pain and irritation.).      loperamide 2 MG capsule   Commonly known as: IMODIUM   Take 1 capsule (2 mg total) by mouth 2 (two) times daily.      LORazepam 0.5 MG tablet   Commonly known as: ATIVAN   Place 0.5 tablets (0.25 mg total) into feeding tube every 8 (eight) hours.      magic mouthwash Soln   Take 5 mLs by mouth 3 (three) times daily between meals as needed ( For mouth pain and irritation.).      multivitamin Liqd   Place 5 mLs into feeding tube daily.      ondansetron 8 MG tablet   Commonly known as: ZOFRAN   Take 1 tablet (8 mg total) by mouth 2 (two) times daily as needed. Put on tongue and let it dissolve.      oxyCODONE 5 MG/5ML solution   Commonly known as: ROXICODONE   Take 5 mLs (5 mg total) by mouth every 4 (four) hours as needed for pain.      pantoprazole sodium 40 mg/20 mL Pack   Commonly known as: PROTONIX   Place 20 mLs (40 mg total) into feeding tube 2 (two) times daily.      sodium bicarbonate 650 MG tablet   Place 2 tablets (1,300 mg total) into feeding tube daily.      sodium chloride 0.9 % injection   Place 75 mLs into feeding tube as needed.      sodium chloride 0.9 % SOLN 150 mL with fluorouracil CALGB 29562 5 GM/100ML SOLN   Inject into the vein. To infuse over 7 days. Change bag every 7 days @ Cancer Center.      terazosin 1 MG capsule   Commonly known as: HYTRIN   Place 1 capsule (1 mg total) into feeding tube at bedtime. Hold for systolic blood pressure of less than 95.      thiamine 100 MG tablet   Place 1 tablet (100 mg total) into feeding tube daily.        Follow-up Information    Follow up with [facility] S, MD. On 12/12/2012. (Follow up with MD 01/08 at10:45. Also go for blood on 01/06 between 9am and 12:00)    Contact information:   [address] [place] [address] [phone]       Follow up with Ky Barban, MD. On 11/14/2012. (Follow up with MD 12/11 at 12":00)     Contact information:   [address] [place] [address] [phone]       Follow up with Ky Barban, MD. On 11/14/2012. (Follow up with MD 12/11 at12:00)    Contact information:   [address] [place] [address] [phone]  The results of significant diagnostics from this hospitalization (including imaging, microbiology, ancillary and laboratory) are listed below for reference.    Significant Diagnostic Studies: Ct Abdomen Pelvis Wo Contrast  10/28/2012  *RADIOLOGY REPORT*  Clinical Data:  Fever and history of esophageal carcinoma.  CT CHEST, ABDOMEN AND PELVIS WITHOUT CONTRAST  Technique:  Multidetector CT imaging of the chest, abdomen and pelvis was performed following the standard protocol without IV contrast.  Comparison:   None.  CT CHEST  Findings:  There is clear interval regression of the large proximal esophageal mass identified previously by CT after therapy.  No obvious intraluminal mass is identified.  Oral contrast was injected through an indwelling gastrostomy tube.  There is some reflux of contrast into the distal esophagus.  There is no evidence of mediastinal air or fluid.  No enlarged lymph nodes are identified.  The visualized airway is normally patent.  Lung windows show no evidence of pulmonary infiltrates or nodules.  Minimal scarring present at both lung bases.  No bony abnormalities are identified.  IMPRESSION: Clear regression of proximal esophageal mass.  No evidence of metastatic disease in the chest or infectious process.  CT ABDOMEN AND PELVIS  Findings:  Unenhanced appearance of the solid organs is unremarkable.  There is suggestion of wall thickening involving some loops of proximal small bowel in the abdomen with wall thickness approaching 8 mm.  Findings may be consistent with regional enteritis.  No associated obstruction or bowel perforation.  Fluid present in the distal rectum.  No free fluid or abnormal fluid  collections.  A gastrostomy tube enters the stomach.  The bladder is decompressed by a Foley catheter.  No enlarged lymph nodes or focal soft tissue masses are identified.  No evidence of free air.  Degenerative changes in the lower lumbar spine.  No focal bony lesions.  IMPRESSION: Thickening of proximal small bowel suggestive of enteritis.  No focal abscess identified.  No evidence of obvious metastatic disease by unenhanced CT.   Original Report Authenticated By: Irish Lack, M.D.    Dg Chest 2 View  10/24/2012  *RADIOLOGY REPORT*  Clinical Data: Chest pain and weakness, history smoking, squamous cell carcinoma of the esophagus, hypertension  CHEST - 2 VIEW  Comparison: 10/10/2012  Findings: Right arm PICC line, tip projecting over SVC just below aortic arch. Normal heart size, mediastinal contours, and pulmonary vascularity. Lungs appear emphysematous but clear. No pleural effusion or pneumothorax. Osseous structures unremarkable.  IMPRESSION: Changes of COPD. No acute abnormalities.   Original Report Authenticated By: Ulyses Southward, M.D.    Ct Chest Wo Contrast  10/28/2012  *RADIOLOGY REPORT*  Clinical Data:  Fever and history of esophageal carcinoma.  CT CHEST, ABDOMEN AND PELVIS WITHOUT CONTRAST  Technique:  Multidetector CT imaging of the chest, abdomen and pelvis was performed following the standard protocol without IV contrast.  Comparison:   None.  CT CHEST  Findings:  There is clear interval regression of the large proximal esophageal mass identified previously by CT after therapy.  No obvious intraluminal mass is identified.  Oral contrast was injected through an indwelling gastrostomy tube.  There is some reflux of contrast into the distal esophagus.  There is no evidence of mediastinal air or fluid.  No enlarged lymph nodes are identified.  The visualized airway is normally patent.  Lung windows show no evidence of pulmonary infiltrates or nodules.  Minimal scarring present at both lung bases.   No bony abnormalities are identified.  IMPRESSION: Clear regression of  proximal esophageal mass.  No evidence of metastatic disease in the chest or infectious process.  CT ABDOMEN AND PELVIS  Findings:  Unenhanced appearance of the solid organs is unremarkable.  There is suggestion of wall thickening involving some loops of proximal small bowel in the abdomen with wall thickness approaching 8 mm.  Findings may be consistent with regional enteritis.  No associated obstruction or bowel perforation.  Fluid present in the distal rectum.  No free fluid or abnormal fluid collections.  A gastrostomy tube enters the stomach.  The bladder is decompressed by a Foley catheter.  No enlarged lymph nodes or focal soft tissue masses are identified.  No evidence of free air.  Degenerative changes in the lower lumbar spine.  No focal bony lesions.  IMPRESSION: Thickening of proximal small bowel suggestive of enteritis.  No focal abscess identified.  No evidence of obvious metastatic disease by unenhanced CT.   Original Report Authenticated By: Irish Lack, M.D.    US Venous Img Lower Bilateral  10/28/2012  *RADIOLOGY REPORT*  Clinical Data: CHF and lower extremity pain.  BILATERAL LOWER EXTREMITY VENOUS DUPLEX ULTRASOUND  Technique:  Gray-scale sonography with graded compression, as well as color Doppler and duplex ultrasound, were performed to evaluate the deep venous system of both lower extremities from the level of the common femoral vein through the popliteal and proximal calf veins.  Spectral Doppler was utilized to evaluate flow at rest and with distal augmentation maneuvers.  Comparison:  None.  Findings:  Normal compressibility of bilateral common femoral, superficial femoral, and popliteal veins is demonstrated, as well as the visualized proximal calf veins.  No filling defects to suggest DVT on grayscale or color Doppler imaging.  Doppler waveforms show normal direction of venous flow, normal respiratory phasicity  and response to augmentation.  IMPRESSION: No evidence of deep vein thrombosis in either lower extremity.   Original Report Authenticated By: Irish Lack, M.D.    Dg Chest Port 1 View  10/26/2012  *RADIOLOGY REPORT*  Clinical Data: Fever  PORTABLE CHEST - 1 VIEW  Comparison: 10/24/2012  Findings: Right arm PICC tip in the SVC is unchanged.  Lungs remain clear without an infiltrate  IMPRESSION: No acute abnormality and no interval change.   Original Report Authenticated By: Janeece Riggers, M.D.    Dg Chest Port 1 View  10/10/2012  *RADIOLOGY REPORT*  Clinical Data: Right side PICC line placement  PORTABLE CHEST - 1 VIEW  Comparison: Portable exam 0925 hours compared to 07/16/2012  Findings: Right arm PICC line, tip projecting over SVC at the level of the AP window. Normal heart size, mediastinal contours, and pulmonary vascularity. Lungs appear hyperinflated but clear. No pleural effusion or gross pneumothorax. Gastrostomy tube noted.  IMPRESSION: Tip of right arm PICC line projects over the proximal SVC at the level of the AP window.   Original Report Authenticated By: Ulyses Southward, M.D.    Dg Abd Portable 1v  10/26/2012  *RADIOLOGY REPORT*  Clinical Data: Abdominal pain, fever  PORTABLE ABDOMEN - 1 VIEW  Comparison: PET CT 10/03/2012  Findings: Single abdominal radiograph demonstrates a nonspecific, nonobstructed bowel gas pattern.  Bowel gas noted throughout the colon to the level of the rectum.  A percutaneous gastrostomy tube projects over the gastric body.  No large free air on this single supine radiograph.  Mild lower lumbar degenerative disc disease.  IMPRESSION:  1.  Nonobstructed bowel gas pattern. 2.  Percutaneous G tube projects over the gastric body. 3.  Mild lower  lumbar degenerative changes.  A   Original Report Authenticated By: Malachy Moan, M.D.     Microbiology: Recent Results (from the past 240 hour(s))  CLOSTRIDIUM DIFFICILE BY PCR     Status: Normal   Collection Time    10/29/12  2:00 AM      Component Value Range Status Comment   C difficile by pcr NEGATIVE  NEGATIVE Final      Labs: Basic Metabolic Panel:  Lab 11/07/12 1610 11/06/12 0508 11/05/12 0549 11/04/12 0615 11/03/12 0502 11/02/12 0525  NA 134* 137 139 141 147* --  K 4.1 4.6 4.0 3.1* 3.2* --  CL 104 106 107 108 115* --  CO2 23 23 21 22 20  --  GLUCOSE 103* 102* 123* 117* 137* --  BUN 31* 40* 43* 48* 57* --  CREATININE 1.81* 2.44* 2.76* 3.10* 3.19* --  CALCIUM 7.6* 7.5* 7.5* 7.8* 8.2* --  MG -- -- -- -- -- --  PHOS -- 2.5 3.3 4.5 -- 4.6   Liver Function Tests: No results found for this basename: AST:5,ALT:5,ALKPHOS:5,BILITOT:5,PROT:5,ALBUMIN:5 in the last 168 hours No results found for this basename: LIPASE:5,AMYLASE:5 in the last 168 hours No results found for this basename: AMMONIA:5 in the last 168 hours CBC:  Lab 11/07/12 0445 11/06/12 0508 11/05/12 0549  WBC 9.7 8.9 8.6  NEUTROABS -- -- --  HGB 7.9* 8.6* 9.6*  HCT 23.6* 25.2* 28.3*  MCV 96.7 95.8 95.0  PLT 196 188 183   Cardiac Enzymes: No results found for this basename: CKTOTAL:5,CKMB:5,CKMBINDEX:5,TROPONINI:5 in the last 168 hours BNP: BNP (last 3 results) No results found for this basename: PROBNP:3 in the last 8760 hours CBG:  Lab 11/07/12 1120 11/07/12 0728 11/06/12 2044 11/06/12 1622 11/06/12 1127  GLUCAP 102* 106* 100* 113* 111*       Signed:  Fidelia Cathers  [facility] 11/07/2012, 3:44 PM

## 2012-11-07 NOTE — Progress Notes (Signed)
Subjective: Eddie [name] is seen lying in bed.  His spirits continue to be high.  Eddie [name] informed me that he may be going to [facility] today.    I personally reviewed and went over radiographic studies with the patient.  He was informed of the significant improvement in his cancer with massive shrinkage of his tumor.  He was informed that he is potentially curable of this disease.  This information brought tears to his eyes which he reports are "happy tears."   I expressed the importance of going back on therapy in the future.     Objective: Vital signs in last 24 hours: Temp:  [98.8 F (37.1 C)-99.1 F (37.3 C)] 98.8 F (37.1 C) (12/04 0623) Pulse Rate:  [80-83] 81  (12/04 0623) Resp:  [18-20] 20  (12/04 0623) BP: (97-104)/(57-63) 97/57 mmHg (12/04 0623) SpO2:  [100 %] 100 % (12/04 0710)  Intake/Output from previous day: 12/03 0800 - 12/04 0759 In: 6304 [P.O.:1200; I.V.:2600] Out: 1352 [Urine:1350; Stool:2] Intake/Output this shift: Total I/O In: -  Out: 100 [Urine:100]  General appearance: alert, appears older than stated age, no distress and much improved appearing.  Improved affect and improved spirits.   Lab Results:   [facility] 11/07/12 0445 11/06/12 0508  WBC 9.7 8.9  HGB 7.9* 8.6*  HCT 23.6* 25.2*  PLT 196 188   BMET  Basename 11/07/12 0445 11/06/12 0508  NA 134* 137  K 4.1 4.6  CL 104 106  CO2 23 23  GLUCOSE 103* 102*  BUN 31* 40*  CREATININE 1.81* 2.44*  CALCIUM 7.6* 7.5*    Studies/Results: No results found.  Medications: I have reviewed the patient's current medications.  Assessment/Plan: 1. Acute Renal injury, BUN 31 and Creatinine 1.81, improving.  2. Anemia.  Iron studies do not reveal iron deficiency. Recommend 2 unit PRBC transfusion before discharge. 3. Stomatitis and oral candidiasis, much improved, nearly resolved.  4. Squamous cell carcinoma of esophagus, S/P 3 cycles of 5 FU 400 mg/m2/day (starting on 10/11/2012) and concomitant  radiation by Dr. Thersa Salt. 5 FU stopped on admission. This cancer is potentially curable. Interval regression of esophageal mass on CT of chest. Regression is significant on review of CT. 6. Cachexia with malnutrition 7. Strongly encourage re-initiation of chemoradiation when patient is discharged and recovered. Patient will require daily transportation for radiation and weekly transportation to [facility] for pump changes. Chemotherapy has been reduced by 50% and will be restarted with radiation when patient is fit for therapy.  8. We will start chemotherapy on Monday with a 50% dose reduction in 5FU.  I spoke with Radiation Oncology, Dr. Wynonia Hazard and they will start radiation on Monday 12/9 at 11 AM.  Patient is to report to [facility] at 2:15 PM on Monday 12/9 for 5 FU CI start.     Patient seen by Dr. Demetrios Isaacs as well.    LOS: 14 days    Eddie [name] 11/07/2012

## 2012-11-07 NOTE — Progress Notes (Addendum)
Eddie [name]  MRN: [id number]  DOB/AGE: Apr 23, 1946 66 y.o.  Primary Care Physician:[place], Kingsley Spittle, MD  Admit date: 10/24/2012  Chief Complaint:  Chief Complaint  Patient presents with  . Weakness    S-Pt presented on  10/24/2012 with  Chief Complaint  Patient presents with  . Weakness  .    Pt today feels better. Pt offers no new complaints .    Pt says he is able to drink but not back to his normal  yet.   Meds    . albuterol  2.5 mg Nebulization BID  . antiseptic oral rinse  15 mL Mouth Rinse q12n4p  . bismuth subsalicylate  30 mL Per Tube QID  . chlorhexidine  15 mL Mouth Rinse BID  . ciprofloxacin  500 mg Oral BID  . enoxaparin (LOVENOX) injection  30 mg Subcutaneous Q24H  . feeding supplement  1 Container Oral BID BM  . fluconazole  100 mg Oral Daily  . folic acid  1 mg Per Tube Daily  . free water  50 mL Per Tube Q8H  . insulin aspart  0-5 Units Subcutaneous QHS  . insulin aspart  0-9 Units Subcutaneous TID WC  . magic mouthwash  5 mL Oral TID PC & HS   And  . lidocaine  5 mL Mouth/Throat TID PC & HS  . loperamide  2 mg Oral BID  . LORazepam  0.25 mg Per Tube Q8H  . metroNIDAZOLE  250 mg Per Tube TID  . multivitamin  5 mL Per Tube Daily  . ondansetron  4 mg Per Tube Q6H  . pantoprazole sodium  40 mg Per Tube BID  . sodium bicarbonate  1,300 mg Oral BID  . thiamine  100 mg Per Tube Daily  . [DISCONTINUED] fluconazole (DIFLUCAN) IV  100 mg Intravenous Q24H  . [DISCONTINUED] free water  100 mL Per Tube Q8H      Physical Exam: Vital signs in last 24 hours: Temp:  [98.8 F (37.1 C)-99.1 F (37.3 C)] 98.8 F (37.1 C) (12/04 0623) Pulse Rate:  [80-83] 81  (12/04 0623) Resp:  [18-20] 20  (12/04 0623) BP: (97-104)/(57-63) 97/57 mmHg (12/04 0623) SpO2:  [100 %] 100 % (12/04 0710) Weight change:  Last BM Date: 11/07/12  Intake/Output from previous day: 12/03 0701 - 12/04 0700 In: 6304 [P.O.:1200; I.V.:2600] Out: 1352 [Urine:1350; Stool:2]      Physical Exam: General: Cachectic,,no acute distress.  Resp: NO distress,CTa b/l CVSt: S1, S2, with no murmurs rubs or gallops.  GIT:  bowel sounds present  PEG tube in situ;soft , NT Extremities: Diffuse muscle atrophy.NO edema     Lab Results: CBC  Basename 11/07/12 0445 11/06/12 0508  WBC 9.7 8.9  HGB 7.9* 8.6*  HCT 23.6* 25.2*  PLT 196 188    BMET  Basename 11/07/12 0445 11/06/12 0508  NA 134* 137  K 4.1 4.6  CL 104 106  CO2 23 23  GLUCOSE 103* 102*  BUN 31* 40*  CREATININE 1.81* 2.44*  CALCIUM 7.6* 7.5*   Trend  Creat 2013 1.2==>2.44==>1.81 AKI during last admission in October too.      MICRO Recent Results (from the past 240 hour(s))  CLOSTRIDIUM DIFFICILE BY PCR     Status: Normal   Collection Time   10/29/12  2:00 AM      Component Value Range Status Comment   C difficile by pcr NEGATIVE  NEGATIVE Final       Lab Results  Component Value Date  CALCIUM 7.6* 11/07/2012   PHOS 2.5 11/06/2012               Impression: 1)Renal  AKI secondary to ATN                NON Oliguric ATN from Hypovolemia+ Hypotension  + SIRS                AKI improving slowly.                 2)HTN  BP at goal   3)Anemia secondary to Esophageal cancer. Oncology following.   4)ONcology- Squamous cell ca of Esophagus   Oncology following   5)ID -Entertis ? Now Everlean Patterson Laqueta Jean  dced Now on meds for H pylori.   6)FEN  Hyponatemic- was hypernatremic earlier so was in 1/2 NS     7)Acid base Co2  at goal    Plan: 1) will change IVF to NS 2) will suggest more free water per peg ( if not adequate PO intake-marked  Diff on po intake between 12/2 and 12/3-error most likely)-will change free water to q 6       BHUTANI,MANPREET S 11/07/2012, 9:04 AM

## 2012-11-08 ENCOUNTER — Inpatient Hospital Stay (HOSPITAL_COMMUNITY): Payer: Self-pay

## 2012-11-12 ENCOUNTER — Other Ambulatory Visit (HOSPITAL_COMMUNITY): Payer: Self-pay | Admitting: Oncology

## 2012-11-12 ENCOUNTER — Encounter (HOSPITAL_COMMUNITY): Payer: Medicare Other | Attending: Oncology

## 2012-11-12 ENCOUNTER — Telehealth (INDEPENDENT_AMBULATORY_CARE_PROVIDER_SITE_OTHER): Payer: Self-pay | Admitting: *Deleted

## 2012-11-12 ENCOUNTER — Telehealth (HOSPITAL_COMMUNITY): Payer: Self-pay | Admitting: *Deleted

## 2012-11-12 DIAGNOSIS — C159 Malignant neoplasm of esophagus, unspecified: Secondary | ICD-10-CM

## 2012-11-12 DIAGNOSIS — Z5111 Encounter for antineoplastic chemotherapy: Secondary | ICD-10-CM

## 2012-11-12 LAB — CBC WITH DIFFERENTIAL/PLATELET
Basophils Relative: 1 % (ref 0–1)
Eosinophils Relative: 1 % (ref 0–5)
HCT: 20.1 % — ABNORMAL LOW (ref 39.0–52.0)
Hemoglobin: 6.5 g/dL — CL (ref 13.0–17.0)
Lymphocytes Relative: 11 % — ABNORMAL LOW (ref 12–46)
Monocytes Relative: 9 % (ref 3–12)
Neutro Abs: 8.7 10*3/uL — ABNORMAL HIGH (ref 1.7–7.7)
RBC: 1.96 MIL/uL — ABNORMAL LOW (ref 4.22–5.81)
WBC: 11.1 10*3/uL — ABNORMAL HIGH (ref 4.0–10.5)

## 2012-11-12 LAB — COMPREHENSIVE METABOLIC PANEL
ALT: 17 U/L (ref 0–53)
Alkaline Phosphatase: 64 U/L (ref 39–117)
CO2: 24 mEq/L (ref 19–32)
GFR calc Af Amer: 83 mL/min — ABNORMAL LOW (ref >90)
GFR calc non Af Amer: 71 mL/min — ABNORMAL LOW (ref >90)
Glucose, Bld: 104 mg/dL — ABNORMAL HIGH (ref 70–99)
Potassium: 4.1 mEq/L (ref 3.5–5.1)
Sodium: 137 mEq/L (ref 135–145)

## 2012-11-12 LAB — ABO/RH: ABO/RH(D): A POS

## 2012-11-12 MED ORDER — SODIUM CHLORIDE 0.9 % IJ SOLN
INTRAMUSCULAR | Status: AC
  Filled 2012-11-12: qty 10

## 2012-11-12 MED ORDER — SODIUM CHLORIDE 0.9 % IJ SOLN
10.0000 mL | INTRAMUSCULAR | Status: DC | PRN
  Filled 2012-11-12: qty 10

## 2012-11-12 MED ORDER — SODIUM CHLORIDE 0.9 % IV SOLN
200.0000 mg/m2/d | INTRAVENOUS | Status: DC
  Administered 2012-11-12: 2150 mg via INTRAVENOUS
  Filled 2012-11-12: qty 43

## 2012-11-12 NOTE — Telephone Encounter (Signed)
CRITICAL VALUE ALERT Critical value received:  Hgb 6.5 and hct 20.1 Date of notification:  11/12/2012 Time of notification: 1544 Critical value read back:  yes Nurse who received alert:  TAR MD notified (1st page):  Sent to [facility]

## 2012-11-12 NOTE — Telephone Encounter (Signed)
Britta Mccreedy (sister-in-law, caregiver) call and would like to know how long a peg tub should stay in after a person starts eating? La is eating good and should he have a f/u apt with Dr. Karilyn Cota? Barbara's return phone number is [phone] or [phone].

## 2012-11-12 NOTE — Progress Notes (Signed)
Iv 67fu begun.  Is to receive 2 units prbc's tomorrow am .  Pt and caregiver aware.

## 2012-11-13 ENCOUNTER — Encounter (HOSPITAL_BASED_OUTPATIENT_CLINIC_OR_DEPARTMENT_OTHER): Payer: Medicare Other

## 2012-11-13 ENCOUNTER — Ambulatory Visit: Payer: Medicare Other | Admitting: Family Medicine

## 2012-11-13 VITALS — BP 98/61 | HR 80 | Temp 98.6°F | Resp 18

## 2012-11-13 DIAGNOSIS — C159 Malignant neoplasm of esophagus, unspecified: Secondary | ICD-10-CM

## 2012-11-13 DIAGNOSIS — D649 Anemia, unspecified: Secondary | ICD-10-CM

## 2012-11-13 MED ORDER — SODIUM CHLORIDE 0.9 % IJ SOLN
10.0000 mL | INTRAMUSCULAR | Status: DC | PRN
  Filled 2012-11-13: qty 10

## 2012-11-13 MED ORDER — ACETAMINOPHEN 325 MG PO TABS
ORAL_TABLET | ORAL | Status: AC
  Filled 2012-11-13: qty 2

## 2012-11-13 MED ORDER — DIPHENHYDRAMINE HCL 25 MG PO CAPS
25.0000 mg | ORAL_CAPSULE | Freq: Once | ORAL | Status: AC
Start: 2012-11-13 — End: 2012-11-13
  Administered 2012-11-13: 25 mg via ORAL

## 2012-11-13 MED ORDER — SODIUM CHLORIDE 0.9 % IV SOLN
250.0000 mL | Freq: Once | INTRAVENOUS | Status: AC
  Administered 2012-11-13: 250 mL via INTRAVENOUS

## 2012-11-13 MED ORDER — DIPHENHYDRAMINE HCL 25 MG PO CAPS
ORAL_CAPSULE | ORAL | Status: AC
  Filled 2012-11-13: qty 1

## 2012-11-13 MED ORDER — SODIUM CHLORIDE 0.9 % IJ SOLN
INTRAMUSCULAR | Status: AC
  Filled 2012-11-13: qty 10

## 2012-11-13 MED ORDER — ACETAMINOPHEN 325 MG PO TABS
650.0000 mg | ORAL_TABLET | Freq: Once | ORAL | Status: AC
  Administered 2012-11-13: 650 mg via ORAL

## 2012-11-13 NOTE — Progress Notes (Signed)
Eddie [name] tolerated blood transfusions well and w/o incident - discharged home in his families care.

## 2012-11-13 NOTE — Telephone Encounter (Signed)
Per Dr.Rehman when the patient has completes treatment will consider taking out peg. Britta Mccreedy called and made aware.

## 2012-11-14 ENCOUNTER — Inpatient Hospital Stay (HOSPITAL_COMMUNITY): Payer: Self-pay

## 2012-11-14 LAB — TYPE AND SCREEN: Unit division: 0

## 2012-11-15 ENCOUNTER — Inpatient Hospital Stay (HOSPITAL_COMMUNITY): Payer: Self-pay

## 2012-11-16 MED ORDER — SODIUM CHLORIDE 0.9 % IJ SOLN
INTRAMUSCULAR | Status: AC
  Filled 2012-11-16: qty 10

## 2012-11-19 ENCOUNTER — Encounter (HOSPITAL_BASED_OUTPATIENT_CLINIC_OR_DEPARTMENT_OTHER): Payer: Medicare Other

## 2012-11-19 ENCOUNTER — Encounter (HOSPITAL_COMMUNITY): Payer: Self-pay

## 2012-11-19 VITALS — BP 119/71 | HR 67 | Temp 99.0°F | Resp 20 | Wt 122.6 lb

## 2012-11-19 DIAGNOSIS — Z5111 Encounter for antineoplastic chemotherapy: Secondary | ICD-10-CM

## 2012-11-19 DIAGNOSIS — C159 Malignant neoplasm of esophagus, unspecified: Secondary | ICD-10-CM

## 2012-11-19 LAB — COMPREHENSIVE METABOLIC PANEL
ALT: 9 U/L (ref 0–53)
AST: 11 U/L (ref 0–37)
Alkaline Phosphatase: 78 U/L (ref 39–117)
CO2: 30 mEq/L (ref 19–32)
Chloride: 107 mEq/L (ref 96–112)
GFR calc Af Amer: 84 mL/min — ABNORMAL LOW (ref >90)
GFR calc non Af Amer: 72 mL/min — ABNORMAL LOW (ref >90)
Glucose, Bld: 107 mg/dL — ABNORMAL HIGH (ref 70–99)
Potassium: 4 mEq/L (ref 3.5–5.1)
Sodium: 142 mEq/L (ref 135–145)
Total Bilirubin: 0.2 mg/dL — ABNORMAL LOW (ref 0.3–1.2)

## 2012-11-19 LAB — CBC WITH DIFFERENTIAL/PLATELET
Basophils Absolute: 0.1 10*3/uL (ref 0.0–0.1)
Eosinophils Relative: 3 % (ref 0–5)
Lymphocytes Relative: 14 % (ref 12–46)
Lymphs Abs: 1 10*3/uL (ref 0.7–4.0)
MCV: 100.4 fL — ABNORMAL HIGH (ref 78.0–100.0)
Neutro Abs: 5 10*3/uL (ref 1.7–7.7)
Neutrophils Relative %: 70 % (ref 43–77)
Platelets: 223 10*3/uL (ref 150–400)
RBC: 2.81 MIL/uL — ABNORMAL LOW (ref 4.22–5.81)
WBC: 7.1 10*3/uL (ref 4.0–10.5)

## 2012-11-19 MED ORDER — SODIUM CHLORIDE 0.9 % IJ SOLN
10.0000 mL | INTRAMUSCULAR | Status: DC | PRN
  Administered 2012-11-19: 10 mL
  Filled 2012-11-19: qty 10

## 2012-11-19 MED ORDER — SODIUM CHLORIDE 0.9 % IV SOLN
200.0000 mg/m2/d | INTRAVENOUS | Status: DC
  Administered 2012-11-19: 2150 mg via INTRAVENOUS
  Filled 2012-11-19: qty 43

## 2012-11-19 MED ORDER — SODIUM CHLORIDE 0.9 % IJ SOLN
INTRAMUSCULAR | Status: AC
  Filled 2012-11-19: qty 20

## 2012-11-19 NOTE — Progress Notes (Signed)
Tolerated well. No complaints voiced. 

## 2012-11-21 ENCOUNTER — Encounter (HOSPITAL_COMMUNITY): Payer: Self-pay

## 2012-11-22 ENCOUNTER — Encounter (HOSPITAL_COMMUNITY): Payer: Self-pay

## 2012-11-23 NOTE — Telephone Encounter (Signed)
closed

## 2012-11-26 ENCOUNTER — Encounter (HOSPITAL_COMMUNITY): Payer: Self-pay | Admitting: Oncology

## 2012-11-26 ENCOUNTER — Ambulatory Visit (HOSPITAL_COMMUNITY): Payer: Self-pay | Admitting: Oncology

## 2012-11-26 ENCOUNTER — Encounter (HOSPITAL_BASED_OUTPATIENT_CLINIC_OR_DEPARTMENT_OTHER): Payer: Medicare Other

## 2012-11-26 ENCOUNTER — Encounter (HOSPITAL_BASED_OUTPATIENT_CLINIC_OR_DEPARTMENT_OTHER): Payer: Medicare Other | Admitting: Oncology

## 2012-11-26 VITALS — BP 120/66 | HR 78 | Resp 16 | Wt 119.0 lb

## 2012-11-26 DIAGNOSIS — L0203 Carbuncle of face: Secondary | ICD-10-CM

## 2012-11-26 DIAGNOSIS — C159 Malignant neoplasm of esophagus, unspecified: Secondary | ICD-10-CM

## 2012-11-26 DIAGNOSIS — Z5111 Encounter for antineoplastic chemotherapy: Secondary | ICD-10-CM

## 2012-11-26 LAB — COMPREHENSIVE METABOLIC PANEL
Albumin: 2.9 g/dL — ABNORMAL LOW (ref 3.5–5.2)
Alkaline Phosphatase: 88 U/L (ref 39–117)
BUN: 16 mg/dL (ref 6–23)
Calcium: 9 mg/dL (ref 8.4–10.5)
GFR calc Af Amer: 79 mL/min — ABNORMAL LOW (ref >90)
Glucose, Bld: 53 mg/dL — ABNORMAL LOW (ref 70–99)
Potassium: 4.4 mEq/L (ref 3.5–5.1)
Sodium: 141 mEq/L (ref 135–145)
Total Protein: 6.7 g/dL (ref 6.0–8.3)

## 2012-11-26 LAB — CBC WITH DIFFERENTIAL/PLATELET
Basophils Absolute: 0 10*3/uL (ref 0.0–0.1)
Basophils Relative: 0 % (ref 0–1)
Eosinophils Absolute: 0.2 10*3/uL (ref 0.0–0.7)
Lymphocytes Relative: 15 % (ref 12–46)
MCH: 31.9 pg (ref 26.0–34.0)
MCHC: 32.1 g/dL (ref 30.0–36.0)
Monocytes Absolute: 0.9 10*3/uL (ref 0.1–1.0)
Neutrophils Relative %: 73 % (ref 43–77)
RDW: 14.6 % (ref 11.5–15.5)

## 2012-11-26 MED ORDER — SODIUM CHLORIDE 0.9 % IV SOLN
200.0000 mg/m2/d | INTRAVENOUS | Status: DC
  Administered 2012-11-26: 2150 mg via INTRAVENOUS
  Filled 2012-11-26: qty 43

## 2012-11-26 MED ORDER — SODIUM CHLORIDE 0.9 % IJ SOLN
10.0000 mL | INTRAMUSCULAR | Status: DC | PRN
  Filled 2012-11-26: qty 10

## 2012-11-26 NOTE — Patient Instructions (Signed)
.  [facility] Discharge Instructions  RECOMMENDATIONS MADE BY THE CONSULTANT AND ANY TEST RESULTS WILL BE SENT TO YOUR REFERRING PHYSICIAN.  EXAM FINDINGS BY THE PHYSICIAN TODAY AND SIGNS OR SYMPTOMS TO REPORT TO CLINIC OR PRIMARY PHYSICIAN:  Exam good.  Continue the same   INSTRUCTIONS GIVEN AND DISCUSSED: Use hot  Soaks to chin and hydrogen peroxide  Wash hands anytime that you touch that area Use alcohol gell  SPECIAL INSTRUCTIONS/FOLLOW-UP:  Thank you for choosing [facility] to provide your oncology and hematology care.  To afford each patient quality time with our providers, please arrive at least 15 minutes before your scheduled appointment time.  With your help, our goal is to use those 15 minutes to complete the necessary work-up to ensure our physicians have the information they need to help with your evaluation and healthcare recommendations.    Effective January 1st, 2014, we ask that you re-schedule your appointment with our physicians should you arrive 10 or more minutes late for your appointment.  We strive to give you quality time with our providers, and arriving late affects you and other patients whose appointments are after yours.    Again, thank you for choosing [facility].  Our hope is that these requests will decrease the amount of time that you wait before being seen by our physicians.       _____________________________________________________________  I acknowledge that I have been informed and understand all the instructions given to me and received a copy. I do not have anymore questions at this time but understand that I may call the Cancer Center at [facility] at [phone] during business hours should I have any further questions or need assistance in obtaining follow-up care.

## 2012-11-26 NOTE — Progress Notes (Signed)
Reaccessed port per protocoll.. Connected new bag of chemo.  Pt tolerated well.

## 2012-11-26 NOTE — Progress Notes (Signed)
Problem #1 unresectable squamous cell carcinoma of the esophagus with positive nodes high in the paratracheal region. He was not felt to be a surgical candidate by Dr. Tyrone Sage of [facility]. He is tolerating 5-FU much better now with a dose reduction. He had been hospitalized for mucositis, dehydration, etc.He is still at the rehabilitation center but I suspect he is ready to be discharged. He is now swallowing quite easily though I have encouraged him to leave his PEG tube in place and to continue the feedings during the night since he needs the calories. He also needs to take his thiamine  and multivitamin daily. He is here today with his wife. He has stopped drinking and smoking. He had been drinking right up to the time he was diagnosed. His last CAT scan while hospitalized showed nice regression of the cancer and the associated lymph nodes. Problem #2 alcoholism Problem #3 malnutrition Problem #4 possible staph infection right chin we discussed what to do with hot soaks, peroxide, and washing his hands after touching this area. Problem #5 renal insufficiency which prevented [place] from using cis-platinum chemotherapy. Interestingly as he is quit drinking his creatinine is now down to normal range.  This is the best I have seen him. He does have a right supraclavicular lymph node versus bony extension from his clavicle on the right approximately 1 cm, very anteriorly located in the right supraclavicular fossa. Lungs though are clear. He does have a 9-10 mm boil on the right lower chin with a clear-cut pore. He states pus emanates from this. He has no neck nodes infraclavicular nodes or axillary nodes. Heart shows a regular rhythm and rate with a grade 1/6 systolic ejection murmur without obvious S3 gallop. His abdomen is flat. No leg edema and no arm edema. His port is intact. PEG tube is in place.  We will continue the 5-FU and radiation. He will come back in 2 weeks. Prostate 4-6 weeks after radiation is  completed we will rescan him with a PET scan. We will set this up with his next visit if all is going well.

## 2012-11-30 ENCOUNTER — Telehealth (HOSPITAL_COMMUNITY): Payer: Self-pay

## 2012-11-30 ENCOUNTER — Other Ambulatory Visit (HOSPITAL_COMMUNITY): Payer: Self-pay | Admitting: *Deleted

## 2012-11-30 NOTE — Progress Notes (Signed)
Pt's wife called requesting refills on his vitamins. I called in refills on folic acid. His thiamine and MVI are over the counter drugs. Message left for [facility].

## 2012-12-03 ENCOUNTER — Encounter (HOSPITAL_BASED_OUTPATIENT_CLINIC_OR_DEPARTMENT_OTHER): Payer: Medicare Other

## 2012-12-03 VITALS — BP 116/68 | HR 70 | Temp 98.4°F | Resp 16 | Wt 119.0 lb

## 2012-12-03 DIAGNOSIS — C159 Malignant neoplasm of esophagus, unspecified: Secondary | ICD-10-CM

## 2012-12-03 DIAGNOSIS — Z5111 Encounter for antineoplastic chemotherapy: Secondary | ICD-10-CM

## 2012-12-03 LAB — CBC WITH DIFFERENTIAL/PLATELET
Basophils Absolute: 0 10*3/uL (ref 0.0–0.1)
Basophils Relative: 0 % (ref 0–1)
Lymphocytes Relative: 7 % — ABNORMAL LOW (ref 12–46)
Neutro Abs: 10.9 10*3/uL — ABNORMAL HIGH (ref 1.7–7.7)
Platelets: 232 10*3/uL (ref 150–400)
RDW: 14.1 % (ref 11.5–15.5)
WBC: 13 10*3/uL — ABNORMAL HIGH (ref 4.0–10.5)

## 2012-12-03 LAB — COMPREHENSIVE METABOLIC PANEL
ALT: 9 U/L (ref 0–53)
AST: 12 U/L (ref 0–37)
Albumin: 3.2 g/dL — ABNORMAL LOW (ref 3.5–5.2)
CO2: 26 mEq/L (ref 19–32)
Calcium: 9.3 mg/dL (ref 8.4–10.5)
Chloride: 102 mEq/L (ref 96–112)
GFR calc non Af Amer: 63 mL/min — ABNORMAL LOW (ref >90)
Sodium: 137 mEq/L (ref 135–145)
Total Bilirubin: 0.5 mg/dL (ref 0.3–1.2)

## 2012-12-03 MED ORDER — SODIUM CHLORIDE 0.9 % IJ SOLN
10.0000 mL | INTRAMUSCULAR | Status: DC | PRN
  Administered 2012-12-03: 10 mL
  Filled 2012-12-03: qty 10

## 2012-12-03 MED ORDER — SODIUM CHLORIDE 0.9 % IV SOLN
200.0000 mg/m2/d | INTRAVENOUS | Status: DC
  Administered 2012-12-03: 2150 mg via INTRAVENOUS
  Filled 2012-12-03: qty 43

## 2012-12-03 NOTE — Progress Notes (Signed)
Tolerated reinstitution of continuous iv pump well.  Picc clean and dry.  New dressing applied, after cleansing site with chlorhexadine.  Insertion site without redness, swelling or exudate.

## 2012-12-10 ENCOUNTER — Encounter (HOSPITAL_COMMUNITY): Payer: Medicare Other | Attending: Oncology

## 2012-12-10 DIAGNOSIS — Z5111 Encounter for antineoplastic chemotherapy: Secondary | ICD-10-CM

## 2012-12-10 DIAGNOSIS — C159 Malignant neoplasm of esophagus, unspecified: Secondary | ICD-10-CM

## 2012-12-10 LAB — COMPREHENSIVE METABOLIC PANEL
Albumin: 3.2 g/dL — ABNORMAL LOW (ref 3.5–5.2)
BUN: 12 mg/dL (ref 6–23)
Chloride: 109 mEq/L (ref 96–112)
Creatinine, Ser: 0.98 mg/dL (ref 0.50–1.35)
GFR calc Af Amer: >90 mL/min (ref >90)
Glucose, Bld: 82 mg/dL (ref 70–99)
Total Bilirubin: 0.2 mg/dL — ABNORMAL LOW (ref 0.3–1.2)

## 2012-12-10 LAB — CBC WITH DIFFERENTIAL/PLATELET
Basophils Absolute: 0 10*3/uL (ref 0.0–0.1)
Eosinophils Relative: 3 % (ref 0–5)
Lymphocytes Relative: 14 % (ref 12–46)
Lymphs Abs: 1.2 10*3/uL (ref 0.7–4.0)
MCV: 96.1 fL (ref 78.0–100.0)
Neutro Abs: 6.4 10*3/uL (ref 1.7–7.7)
Neutrophils Relative %: 73 % (ref 43–77)
Platelets: 200 10*3/uL (ref 150–400)
RBC: 3.32 MIL/uL — ABNORMAL LOW (ref 4.22–5.81)
RDW: 14.1 % (ref 11.5–15.5)
WBC: 8.8 10*3/uL (ref 4.0–10.5)

## 2012-12-10 MED ORDER — SODIUM CHLORIDE 0.9 % IJ SOLN
20.0000 mL | INTRAMUSCULAR | Status: DC | PRN
  Filled 2012-12-10: qty 20

## 2012-12-10 MED ORDER — SODIUM CHLORIDE 0.9 % IV SOLN
200.0000 mg/m2/d | INTRAVENOUS | Status: DC
  Administered 2012-12-10: 2150 mg via INTRAVENOUS
  Filled 2012-12-10: qty 43

## 2012-12-11 ENCOUNTER — Encounter (HOSPITAL_COMMUNITY): Payer: Self-pay | Admitting: Oncology

## 2012-12-11 ENCOUNTER — Encounter (HOSPITAL_BASED_OUTPATIENT_CLINIC_OR_DEPARTMENT_OTHER): Payer: Medicare Other | Admitting: Oncology

## 2012-12-11 VITALS — BP 122/67 | HR 65 | Temp 98.3°F | Resp 16 | Wt 120.6 lb

## 2012-12-11 DIAGNOSIS — C159 Malignant neoplasm of esophagus, unspecified: Secondary | ICD-10-CM

## 2012-12-11 DIAGNOSIS — E46 Unspecified protein-calorie malnutrition: Secondary | ICD-10-CM

## 2012-12-11 NOTE — Patient Instructions (Signed)
.  [facility] Discharge Instructions  RECOMMENDATIONS MADE BY THE CONSULTANT AND ANY TEST RESULTS WILL BE SENT TO YOUR REFERRING PHYSICIAN.  EXAM FINDINGS BY THE PHYSICIAN TODAY AND SIGNS OR SYMPTOMS TO REPORT TO CLINIC OR PRIMARY PHYSICIAN: Exam per Samuella Bruin PA   Return next Monday as scheduled to remove pump. SPECIAL INSTRUCTIONS/FOLLOW-UP: Pet scan in 4-6 weeks  Thank you for choosing [facility] to provide your oncology and hematology care.  To afford each patient quality time with our providers, please arrive at least 15 minutes before your scheduled appointment time.  With your help, our goal is to use those 15 minutes to complete the necessary work-up to ensure our physicians have the information they need to help with your evaluation and healthcare recommendations.    Effective January 1st, 2014, we ask that you re-schedule your appointment with our physicians should you arrive 10 or more minutes late for your appointment.  We strive to give you quality time with our providers, and arriving late affects you and other patients whose appointments are after yours.    Again, thank you for choosing [facility]  Our hope is that these requests will decrease the amount of time that you wait before being seen by our physicians.       _____________________________________________________________  Should you have questions after your visit to [facility], please contact our office at [phone] between the hours of 8:30 a.m. and 5:00 p.m.  Voicemails left after 4:30 p.m. will not be returned until the following business day.  For prescription refill requests, have your pharmacy contact our office with your prescription refill request.

## 2012-12-11 NOTE — Progress Notes (Signed)
Eddie Antis, MD [address], [address] [place] [address]  1. Squamous cell carcinoma of esophagus     CURRENT THERAPY: S/P 5 cycles of  concomitant chemoradiation with 5 FU continuous infusion from 10/11/2012- 12/16/2012.  Due to be completed on 12/16/2012  INTERVAL HISTORY: Eddie [name] 67 y.o. male returns for  regular  visit for followup of  Unresectable squamous cell carcinoma of the esophagus with positive nodes high in the paratracheal region. He was not felt to be a surgical candidate by Dr. Tyrone Sage of [facility]. He is tolerating 5-FU much better now with a dose reduction. He had been hospitalized for mucositis, dehydration, etc.  Eddie [name] is doing great.  He is taking food by mouth.  He uses his PEG for night feedings to help with his cachexia.    He is tolerating therapy very well with the dose reduction in 5 FU.  He denies any complaints today.   He is scheduled to finish radiation on Thursday.  He is scheduled for a pump removal on Monday after Radiation.   We discussed a PET scan 4-6 weeks after completing radiation.    Complete ROS questioning is negative.   Past Medical History  Diagnosis Date  . Hypertension   . Chronic back pain   . GERD (gastroesophageal reflux disease)   . Squamous cell carcinoma Oct 2014    Esophogus  . Squamous cell carcinoma of esophagus 09/25/2012  . Severe malnutrition 10/26/2012  . Duodenal ulcer 09/2012  . Helicobacter pylori (H. pylori) infection 09/2012    has Essential hypertension, benign; Alcohol use; Dysphagia; Emphysema; Tobacco use; Squamous cell carcinoma of esophagus; Edentulous; Thrush; Stomatitis and mucositis; Throat pain; Leukocytosis (leucocytosis); Dehydration; Cachexia; Chemotherapy adverse reaction; Severe malnutrition; Fever; Nausea and vomiting; Abdominal pain, generalized; Duodenal ulcer; Helicobacter pylori (H. pylori) infection; Thrombocytopenia; Acute renal insufficiency; Hiccups; Hyperglycemia; Enteritis; Metabolic  acidosis; Hypernatremia; and Anemia on his problem list.     is allergic to motrin.  Eddie [name] had no medications administered during this visit.  Past Surgical History  Procedure Date  . Peg tube placement 09/21/2012  . Esophagogastroduodenoscopy 09/21/2012    Procedure: ESOPHAGOGASTRODUODENOSCOPY (EGD);  Surgeon: Malissa Hippo, MD;  Location: [facility];  Service: Endoscopy;  Laterality: N/A;  345  . Peg placement 09/21/2012    Procedure: PERCUTANEOUS ENDOSCOPIC GASTROSTOMY (PEG) PLACEMENT;  Surgeon: Malissa Hippo, MD;  Location: [facility];  Service: Endoscopy;  Laterality: N/A;  . Portacath placement     Denies any headaches, dizziness, double vision, fevers, chills, night sweats, nausea, vomiting, diarrhea, constipation, chest pain, heart palpitations, shortness of breath, blood in stool, black tarry stool, urinary pain, urinary burning, urinary frequency, hematuria.   PHYSICAL EXAMINATION  ECOG PERFORMANCE STATUS: 2 - Symptomatic, <50% confined to bed  Filed Vitals:   12/11/12 1300  BP: 122/67  Pulse: 65  Temp: 98.3 F (36.8 C)  Resp: 16    GENERAL:alert, no distress, cachectic, comfortable, cooperative and smiling SKIN: skin color, texture, turgor are normal, no rashes or significant lesions HEAD: Normocephalic, No masses, lesions, tenderness or abnormalities EYES: normal, Conjunctiva are pink and non-injected EARS: External ears normal OROPHARYNX:lips, buccal mucosa, and tongue normal and mucous membranes are moist  NECK: supple, trachea midline LYMPH:  right supraclavicular lymph node versus bony extension from his clavicle on the right approximately 1 cm, very anteriorly located in the right supraclavicular fossa BREAST:not examined LUNGS: clear to auscultation and percussion HEART: regular rate & rhythm, no murmurs, no  gallops, S1 normal and S2 normal ABDOMEN:abdomen soft, non-tender and normal bowel sounds BACK: Back symmetric, no  curvature. EXTREMITIES:less then 2 second capillary refill, no joint deformities, effusion, or inflammation, no edema, no skin discoloration, no clubbing, no cyanosis  NEURO: alert & oriented x 3 with fluent speech, no focal motor/sensory deficits, gait normal    LABORATORY DATA: CBC    Component Value Date/Time   WBC 8.8 12/10/2012 1525   RBC 3.32* 12/10/2012 1525   HGB 10.6* 12/10/2012 1525   HCT 31.9* 12/10/2012 1525   PLT 200 12/10/2012 1525   MCV 96.1 12/10/2012 1525   MCH 31.9 12/10/2012 1525   MCHC 33.2 12/10/2012 1525   RDW 14.1 12/10/2012 1525   LYMPHSABS 1.2 12/10/2012 1525   MONOABS 0.9 12/10/2012 1525   EOSABS 0.2 12/10/2012 1525   BASOSABS 0.0 12/10/2012 1525      Chemistry      Component Value Date/Time   NA 142 12/10/2012 1525   K 3.9 12/10/2012 1525   CL 109 12/10/2012 1525   CO2 25 12/10/2012 1525   BUN 12 12/10/2012 1525   CREATININE 0.98 12/10/2012 1525   CREATININE 1.12 07/30/2012 0950      Component Value Date/Time   CALCIUM 9.0 12/10/2012 1525   ALKPHOS 68 12/10/2012 1525   AST 11 12/10/2012 1525   ALT 8 12/10/2012 1525   BILITOT 0.2* 12/10/2012 1525        ASSESSMENT:  1. Unresectable squamous cell carcinoma of the esophagus with positive nodes high in the paratracheal region. He was not felt to be a surgical candidate by Dr. Tyrone Sage of [facility]. He is tolerating 5-FU much better now with a dose reduction. He had been hospitalized for mucositis, dehydration, etc. 2. 2. Alcoholism  3. Malnutrition  4. Possible staph infection right chin we discussed what to do with hot soaks, peroxide, and washing his hands after touching this area.  5. Renal insufficiency which prevented [place] from using cis-platinum chemotherapy. Interestingly as he is quit drinking his creatinine is now down to normal range.    PLAN:  1. I personally reviewed and went over laboratory results with the patient. 2. Continue radiation 3. Complete radiation as scheduled on Thursday 4. 5 FU pump removal on Monday as  scheduled 5. Review of medication performed 6. Continue night feedings via PEG tube 7. Encouraged patient to continue to abstain from EtOH and tobacco. 8. PET scan in 4-6 weeks for restaging. 9. Return after PET scan for follow-up.  Will need to get him back to Dr. Karilyn Cota regarding PEG tube.   All questions were answered. The patient knows to call the clinic with any problems, questions or concerns. We can certainly see the patient much sooner if necessary.  The patient and plan discussed with Glenford Peers, MD and he is in agreement with the aforementioned.  Vernice Mannina

## 2012-12-17 ENCOUNTER — Encounter (HOSPITAL_COMMUNITY): Payer: Medicare Other

## 2012-12-17 DIAGNOSIS — C159 Malignant neoplasm of esophagus, unspecified: Secondary | ICD-10-CM

## 2012-12-17 MED ORDER — HEPARIN SOD (PORK) LOCK FLUSH 100 UNIT/ML IV SOLN
250.0000 [IU] | Freq: Once | INTRAVENOUS | Status: DC
  Filled 2012-12-17: qty 5

## 2012-12-17 MED ORDER — HEPARIN SOD (PORK) LOCK FLUSH 100 UNIT/ML IV SOLN
INTRAVENOUS | Status: AC
  Filled 2012-12-17: qty 5

## 2012-12-17 MED ORDER — SODIUM CHLORIDE 0.9 % IJ SOLN
20.0000 mL | INTRAMUSCULAR | Status: DC | PRN
  Filled 2012-12-17: qty 20

## 2012-12-19 ENCOUNTER — Encounter (HOSPITAL_COMMUNITY): Payer: Self-pay

## 2012-12-20 ENCOUNTER — Telehealth: Payer: Self-pay | Admitting: Family Medicine

## 2012-12-20 MED ORDER — DIPHENOXYLATE-ATROPINE 2.5-0.025 MG PO TABS: 1.0000 | ORAL_TABLET | Freq: Four times a day (QID) | ORAL | Status: DC | PRN

## 2012-12-20 NOTE — Telephone Encounter (Signed)
Please call and triage patient. I thought he was in a SNF  He needs to contact his chemotherapy physician and let him know about the side effects Then send me message back

## 2012-12-20 NOTE — Telephone Encounter (Signed)
Spoke with patient.  Advised him to call oncologist and let him know of symptoms.  Patient states that he has loose stools 4-5 times daily.  No nausea.  Some abdominal cramping for episode of diarrhea.

## 2012-12-20 NOTE — Telephone Encounter (Signed)
Lomotil sent

## 2012-12-20 NOTE — Addendum Note (Signed)
Addended by: Milinda Antis F on: 12/20/2012 05:04 PM   Modules accepted: Orders

## 2012-12-20 NOTE — Telephone Encounter (Signed)
Patient aware.

## 2012-12-21 ENCOUNTER — Encounter (HOSPITAL_COMMUNITY): Payer: Self-pay

## 2012-12-24 ENCOUNTER — Encounter (HOSPITAL_COMMUNITY): Payer: Self-pay

## 2012-12-26 ENCOUNTER — Encounter (HOSPITAL_COMMUNITY): Payer: Self-pay

## 2012-12-28 ENCOUNTER — Encounter (HOSPITAL_COMMUNITY): Payer: Self-pay

## 2012-12-31 ENCOUNTER — Encounter (HOSPITAL_COMMUNITY): Payer: Self-pay

## 2013-01-02 ENCOUNTER — Encounter (HOSPITAL_COMMUNITY): Payer: Self-pay

## 2013-01-04 ENCOUNTER — Encounter (HOSPITAL_COMMUNITY): Payer: Self-pay

## 2013-01-10 ENCOUNTER — Encounter: Payer: Self-pay | Admitting: Family Medicine

## 2013-01-10 ENCOUNTER — Ambulatory Visit (INDEPENDENT_AMBULATORY_CARE_PROVIDER_SITE_OTHER): Payer: Medicare Other | Admitting: Family Medicine

## 2013-01-10 VITALS — BP 150/82 | HR 92 | Resp 16 | Ht 66.0 in | Wt 124.0 lb

## 2013-01-10 DIAGNOSIS — N289 Disorder of kidney and ureter, unspecified: Secondary | ICD-10-CM

## 2013-01-10 DIAGNOSIS — Z720 Tobacco use: Secondary | ICD-10-CM

## 2013-01-10 DIAGNOSIS — C159 Malignant neoplasm of esophagus, unspecified: Secondary | ICD-10-CM

## 2013-01-10 DIAGNOSIS — J438 Other emphysema: Secondary | ICD-10-CM

## 2013-01-10 DIAGNOSIS — R7309 Other abnormal glucose: Secondary | ICD-10-CM

## 2013-01-10 DIAGNOSIS — F17200 Nicotine dependence, unspecified, uncomplicated: Secondary | ICD-10-CM

## 2013-01-10 DIAGNOSIS — J439 Emphysema, unspecified: Secondary | ICD-10-CM

## 2013-01-10 DIAGNOSIS — I10 Essential (primary) hypertension: Secondary | ICD-10-CM

## 2013-01-10 DIAGNOSIS — R64 Cachexia: Secondary | ICD-10-CM

## 2013-01-10 DIAGNOSIS — R739 Hyperglycemia, unspecified: Secondary | ICD-10-CM

## 2013-01-10 MED ORDER — SODIUM BICARBONATE 650 MG PO TABS: 1300.0000 mg | ORAL_TABLET | Freq: Every day | ORAL | Status: DC

## 2013-01-10 MED ORDER — TERAZOSIN HCL 1 MG PO CAPS: 1.0000 mg | ORAL_CAPSULE | Freq: Every day | ORAL | Status: DC

## 2013-01-10 MED ORDER — DIPHENOXYLATE-ATROPINE 2.5-0.025 MG PO TABS: 1.0000 | ORAL_TABLET | Freq: Four times a day (QID) | ORAL | Status: DC | PRN

## 2013-01-10 MED ORDER — STERILE WATER FOR IRRIGATION IR SOLN: Status: DC

## 2013-01-10 MED ORDER — ONDANSETRON HCL 8 MG PO TABS: 8.0000 mg | ORAL_TABLET | Freq: Two times a day (BID) | ORAL | Status: DC | PRN

## 2013-01-10 NOTE — Patient Instructions (Addendum)
Refill on medications sent  Continue current meds Stop the lantus for now  Get the labs done for the sugar Continue to check your blood sugar in the morning - call if > 150  Keep working on the smoking  F/U 4 months

## 2013-01-11 ENCOUNTER — Encounter: Payer: Self-pay | Admitting: Family Medicine

## 2013-01-11 LAB — HEMOGLOBIN A1C
Hgb A1c MFr Bld: 5 % (ref <5.7)
Mean Plasma Glucose: 97 mg/dL (ref <117)

## 2013-01-11 NOTE — Assessment & Plan Note (Signed)
Fortunately he's had had any problems with his emphysema throughout this process he's not on any inhalers or medications , he is trying to quit smoking completely

## 2013-01-11 NOTE — Assessment & Plan Note (Signed)
Continue to encourage complete cessation he does smoke one to 2 cigarettes every now and then

## 2013-01-11 NOTE — Assessment & Plan Note (Signed)
He's had no alcohol use for the past 2 months with his treatments he is congratulated on this we will continue to follow him and make sure that he does not relapse

## 2013-01-11 NOTE — Assessment & Plan Note (Signed)
He has lost significant amount of weight with his cancer. He has gained back a few pounds from his last visit with oncology she will continue the nutritional support at bedtime and by mouth

## 2013-01-11 NOTE — Assessment & Plan Note (Signed)
No medications required the Hytrin which is for his prostate also helps with the blood

## 2013-01-11 NOTE — Assessment & Plan Note (Signed)
A1c was obtained it was normal at 5.5 he is not diabetic this which is due to the nutritional support for now we will stop the Lantus

## 2013-01-11 NOTE — Progress Notes (Signed)
  Subjective:    Patient ID: Eddie [name], male    DOB: [birth year], 67 y.o.   MRN: [id number]  HPI  Pt here to f/u chronic medical problems, since our last visit, he has been diagnosed with squamous cell carcinoma the esophagus and he is currently getting chemotherapy and radiation treatments he is doing well he is currently E. now but he does have a PEG tube which she has Jevity running 3 at nighttime managed by GI. G2 his previous round-the-clock nutritional support he was using Lantus however for the past couple weeks has not given any injections his fasting blood sugars have been 120 or less. He no longer requires antihypertensive medication. He did have some problems with urinary retention and acute renal failure during hospitalization however this is now resolved he does still take Cardura at bedtime. He is due for medication refills his bowels are much improved he does use Lomotil very sparingly for cramping and he uses sublingual Zofran for nausea vomiting associated with his medications and treatment  Review of Systems  GEN- denies fatigue, fever, weight loss,weakness, recent illness HEENT- denies eye drainage, change in vision, nasal discharge, CVS- denies chest pain, palpitations RESP- denies SOB, cough, wheeze ABD- + N/ no V, change in stools, abd pain GU- denies dysuria, hematuria, dribbling, incontinence MSK- denies joint pain, muscle aches, injury Neuro- denies headache, dizziness, syncope, seizure activity      Objective:   Physical Exam GEN- NAD, alert and oriented x3 HEENT- PERRL, EOMI, non injected sclera, pink conjunctiva, MMM, oropharynx clear, hyperpigmentation in mouth Neck- Supple,  CVS- RRR, no murmur RESP-CTAB ABD-NABS,soft,NT,ND, Peg tube in place EXT- No edema Pulses- Radial, DP- 2+        Assessment & Plan:

## 2013-01-11 NOTE — Assessment & Plan Note (Signed)
I reviewed his last set of labs this is now resolved he never followed up with nephrology this did not appear to have been  Arranged during his stay in the skilled nursing facility

## 2013-01-17 ENCOUNTER — Other Ambulatory Visit (HOSPITAL_COMMUNITY): Payer: Self-pay

## 2013-01-17 ENCOUNTER — Encounter (HOSPITAL_COMMUNITY): Payer: Medicare Other

## 2013-01-18 ENCOUNTER — Ambulatory Visit (HOSPITAL_COMMUNITY): Payer: Self-pay | Admitting: Oncology

## 2013-01-24 ENCOUNTER — Encounter (HOSPITAL_COMMUNITY)
Admission: RE | Admit: 2013-01-24 | Discharge: 2013-01-24 | Disposition: A | Discharge status: Home / Self Care | Payer: Medicare Other | Source: Ambulatory Visit | Attending: Oncology | Admitting: Oncology

## 2013-01-24 DIAGNOSIS — C159 Malignant neoplasm of esophagus, unspecified: Secondary | ICD-10-CM | POA: Insufficient documentation

## 2013-01-24 MED ORDER — FLUDEOXYGLUCOSE F - 18 (FDG) INJECTION
15.4000 | Freq: Once | INTRAVENOUS | Status: AC | PRN
  Administered 2013-01-24: 15.4 via INTRAVENOUS

## 2013-01-29 ENCOUNTER — Encounter (HOSPITAL_COMMUNITY): Payer: Medicare Other | Attending: Oncology | Admitting: Oncology

## 2013-01-29 ENCOUNTER — Encounter (HOSPITAL_COMMUNITY): Payer: Self-pay | Admitting: Oncology

## 2013-01-29 VITALS — BP 150/83 | HR 77 | Temp 97.7°F | Resp 16 | Wt 127.4 lb

## 2013-01-29 DIAGNOSIS — C159 Malignant neoplasm of esophagus, unspecified: Secondary | ICD-10-CM | POA: Insufficient documentation

## 2013-01-29 LAB — CBC WITH DIFFERENTIAL/PLATELET
Eosinophils Absolute: 0.1 10*3/uL (ref 0.0–0.7)
Hemoglobin: 13.4 g/dL (ref 13.0–17.0)
Lymphocytes Relative: 15 % (ref 12–46)
Lymphs Abs: 1.2 10*3/uL (ref 0.7–4.0)
Monocytes Relative: 10 % (ref 3–12)
Neutro Abs: 5.7 10*3/uL (ref 1.7–7.7)
Neutrophils Relative %: 72 % (ref 43–77)
Platelets: 200 10*3/uL (ref 150–400)
RBC: 4.22 MIL/uL (ref 4.22–5.81)
WBC: 8 10*3/uL (ref 4.0–10.5)

## 2013-01-29 LAB — COMPREHENSIVE METABOLIC PANEL
ALT: 8 U/L (ref 0–53)
Alkaline Phosphatase: 79 U/L (ref 39–117)
Chloride: 103 mEq/L (ref 96–112)
GFR calc Af Amer: 85 mL/min — ABNORMAL LOW (ref >90)
Glucose, Bld: 75 mg/dL (ref 70–99)
Potassium: 3.9 mEq/L (ref 3.5–5.1)
Sodium: 140 mEq/L (ref 135–145)
Total Bilirubin: 0.7 mg/dL (ref 0.3–1.2)
Total Protein: 7.5 g/dL (ref 6.0–8.3)

## 2013-01-29 NOTE — Progress Notes (Signed)
#  1 unresectable squamous cell carcinoma the upper esophagus with positive nodes high in the paratracheal region not felt to be a surgical candidate by Dr. Tyrone Sage of thoracic oncology. He is status post 5-FU by constant infusion in conjunction with radiation therapy by Dr. Thersa Salt. He tolerated it overall quite well. We did have to dose reduce him at one time. His PET scan of the day shows no evidence for disease. Lab work is pending from today. He is asymptomatic on oncology review of systems #2 alcoholism no longer drinking #3 COPD no longer smoking #4 malnutrition at presentation #5 renal sufficiency presentation which prevented [place] from using cis-platinum chemotherapy however since cessation of alcohol his creatinine has improved  BP 150/83  Pulse 77  Temp(Src) 97.7 F (36.5 C) (Oral)  Resp 16  Wt 127 lb 6.4 oz (57.788 kg)  BMI 20.57 kg/m2  He is doing better than I expected. Physical exam shows clear lungs but with diminished breath sounds. He still has a G-tube in place. Abdomen is nondistended. Bowel function he states is excellent. Heart shows a regular rhythm and rate. He has no lymphadenopathy in cervical, supraclavicular, infraclavicular, or axillary areas. He has no leg edema or arm edema. Port-A-Cath is intact  He does not have a followup appointment Dr. Karilyn Cota for removal the G-tube but I do want him to make one with Dr. Karilyn Cota for proximal a one-month. He states his appetite is superb. He is having no trouble swallowing whatsoever. He feels great.  Presently he is NED.  See him in 12 weeks with followup exam and probably blood work at that time. I'm not sure he needs his G-tube any longer.

## 2013-01-29 NOTE — Patient Instructions (Addendum)
[facility] Discharge Instructions  RECOMMENDATIONS MADE BY THE CONSULTANT AND ANY TEST RESULTS WILL BE SENT TO YOUR REFERRING PHYSICIAN.  You are in complete remission. We will check lab work today. We will call you if there are any abnormal results. You need to see the doctor about having your feeding tube removed. Return to clinic in 12 weeks to see MD. Report any issues/concerns as needed prior to appointments.  Thank you for choosing [facility] to provide your oncology and hematology care.  To afford each patient quality time with our providers, please arrive at least 15 minutes before your scheduled appointment time.  With your help, our goal is to use those 15 minutes to complete the necessary work-up to ensure our physicians have the information they need to help with your evaluation and healthcare recommendations.    Effective January 1st, 2014, we ask that you re-schedule your appointment with our physicians should you arrive 10 or more minutes late for your appointment.  We strive to give you quality time with our providers, and arriving late affects you and other patients whose appointments are after yours.    Again, thank you for choosing [facility].  Our hope is that these requests will decrease the amount of time that you wait before being seen by our physicians.       _____________________________________________________________  Should you have questions after your visit to [facility], please contact our office at [phone] between the hours of 8:30 a.m. and 5:00 p.m.  Voicemails left after 4:30 p.m. will not be returned until the following business day.  For prescription refill requests, have your pharmacy contact our office with your prescription refill request.

## 2013-01-30 ENCOUNTER — Telehealth (HOSPITAL_COMMUNITY): Payer: Self-pay

## 2013-01-30 NOTE — Telephone Encounter (Signed)
Call back from patient to verify medications.  He's currently out of the Folic Acid, his multivitamin and he protonix - does not have any refills.  Wants to know if he should still take them?

## 2013-02-01 ENCOUNTER — Telehealth (HOSPITAL_COMMUNITY): Payer: Self-pay

## 2013-02-01 ENCOUNTER — Encounter (HOSPITAL_COMMUNITY): Payer: Self-pay

## 2013-02-01 NOTE — Telephone Encounter (Signed)
Patient notified and states "I'm eating really well now, so I will stay off the medications for now."

## 2013-02-01 NOTE — Telephone Encounter (Signed)
Message copied by Evelena Leyden on Fri Feb 01, 2013  1:06 PM ------      Message from: Mariel Sleet, ERIC S      Created: Fri Feb 01, 2013  8:37 AM       If he is eating real well, then no      If not,take them ------

## 2013-02-07 NOTE — Progress Notes (Signed)
Patient will need office visit within the next one to 2 weeks possibly to remove his G-tube

## 2013-02-28 NOTE — Progress Notes (Signed)
Apt has been scheduled for 03/06/13 with Dr. Karilyn Cota.

## 2013-03-06 ENCOUNTER — Encounter (INDEPENDENT_AMBULATORY_CARE_PROVIDER_SITE_OTHER): Payer: Self-pay | Admitting: Internal Medicine

## 2013-03-06 ENCOUNTER — Ambulatory Visit (INDEPENDENT_AMBULATORY_CARE_PROVIDER_SITE_OTHER): Payer: Medicare Other | Admitting: Internal Medicine

## 2013-03-06 VITALS — BP 120/78 | HR 66 | Temp 98.3°F | Resp 18 | Ht 66.0 in | Wt 128.6 lb

## 2013-03-06 DIAGNOSIS — C159 Malignant neoplasm of esophagus, unspecified: Secondary | ICD-10-CM

## 2013-03-06 DIAGNOSIS — Z931 Gastrostomy status: Secondary | ICD-10-CM

## 2013-03-06 NOTE — Patient Instructions (Signed)
When you stop using gastrostomy tube, call office and we will remove gastrostomy tube.

## 2013-03-06 NOTE — Progress Notes (Signed)
Presenting complaint;  Patient interested in getting gastrostomy tube removed.  Subjective:  Patient is 67 year old African male who was diagnosed with squamous cell carcinoma of the esophagus in October 2013. He had PEG placement same month after the she received chemoradiation therapy. He finished treatment about about 3 months ago. He had a followup PET scan in February 2014  which was negative. His swallowing has improved a great deal. He has no difficulty with liquids or soft foods. He is still not eating meats. He is wondering if he should have gastrostomy tube removed. He states he has still using tube at night when he gives himself a can of Jevity. He is not having any difficulty with the tube. He denies soreness of her leakage around G-tube site. He has gained close to 30 pounds from low of 98 pounds while he was on treatment.  Current Medications: Current Outpatient Prescriptions  Medication Sig Dispense Refill  . Cyanocobalamin (VITAMIN B 12 PO) Take 1 tablet by mouth daily.      . diphenoxylate-atropine (LOMOTIL) 2.5-0.025 MG per tablet Take 1 tablet by mouth 4 (four) times daily as needed for diarrhea or loose stools.  30 tablet  1  . LORazepam (ATIVAN) 0.5 MG tablet Place 0.5 tablets (0.25 mg total) into feeding tube every 8 (eight) hours.  30 tablet  0  . Nutritional Supplements (FEEDING SUPPLEMENT, JEVITY 1.2 CAL,) LIQD Place 1,000 mLs into feeding tube continuous. 1 can at bedtime      . ondansetron (ZOFRAN) 8 MG tablet Take 1 tablet (8 mg total) by mouth 2 (two) times daily as needed. Put on tongue and let it dissolve.  30 tablet  2  . sodium bicarbonate 650 MG tablet Take 2 tablets (1,300 mg total) by mouth daily.  60 tablet  3  . terazosin (HYTRIN) 1 MG capsule Take 1 capsule (1 mg total) by mouth at bedtime.  30 capsule  3  . Water For Irrigation, Sterile (FREE WATER) SOLN Place 250 mLs into feeding tube every 6 (six) hours.       No current facility-administered  medications for this visit.     Objective: Blood pressure 120/78, pulse 66, temperature 98.3 F (36.8 C), temperature source Oral, resp. rate 18, height 5\' 6"  (1.676 m), weight 128 lb 9.6 oz (58.333 kg). Patient is alert and in no acute distress. Conjunctiva is pink. Sclera is nonicteric Oropharyngeal mucosa is normal. No neck masses or thyromegaly noted. Abdomen. PEG stoma is located to the left of midline in the epigastric region and appears very healthy. Abdomen is soft and nontender without organomegaly or masses.  No LE edema or clubbing noted.  Labs/studies Results: PET scan from 01/24/2013 reveals no evidence of hyper metabolic activity as an pre-treatment study..  Assessment:  #1. History of squamous cell carcinoma of the esophagus status post chemoradiation therapy. He appears to be in remission and recent PET scan was negative. He has gained close to 30 pounds. However he is still using gastrostomy tube for nocturnal feeding. Therefore I would recommend holding off G-tube removal until he stopped using the tube and his weight has leveled off. Since PET scan was negative and he is not having any difficulty swallowing I do not feel EGD would assist in his management. However should he develop dysphagia would consider EGD and possible ED.  Plan:  Patient advised to leave gastrostomy tube in place as long as he's using it. He is also encouraged to eat soft meats. Would remove gastrostomy  tube 2 weeks after he has stopped using it. Patient will give [place] a call when he is ready.

## 2013-04-01 ENCOUNTER — Ambulatory Visit (INDEPENDENT_AMBULATORY_CARE_PROVIDER_SITE_OTHER): Payer: Self-pay | Admitting: Internal Medicine

## 2013-04-02 ENCOUNTER — Encounter: Payer: Self-pay | Admitting: Family Medicine

## 2013-04-02 ENCOUNTER — Ambulatory Visit (INDEPENDENT_AMBULATORY_CARE_PROVIDER_SITE_OTHER): Payer: Medicare Other | Admitting: Family Medicine

## 2013-04-02 ENCOUNTER — Encounter: Payer: Self-pay | Admitting: *Deleted

## 2013-04-02 VITALS — BP 118/80 | HR 79 | Temp 98.5°F | Resp 16 | Wt 129.4 lb

## 2013-04-02 DIAGNOSIS — J069 Acute upper respiratory infection, unspecified: Secondary | ICD-10-CM

## 2013-04-02 DIAGNOSIS — J309 Allergic rhinitis, unspecified: Secondary | ICD-10-CM

## 2013-04-02 DIAGNOSIS — J302 Other seasonal allergic rhinitis: Secondary | ICD-10-CM

## 2013-04-02 MED ORDER — LORATADINE 10 MG PO TABS: 10.0000 mg | ORAL_TABLET | Freq: Every day | ORAL | Status: DC | PRN

## 2013-04-02 MED ORDER — AZITHROMYCIN 250 MG PO TABS: ORAL_TABLET | ORAL | Status: AC

## 2013-04-02 NOTE — Progress Notes (Signed)
  Subjective:    Patient ID: Eddie [name], male    DOB: Sep 20, 1946, 67 y.o.   MRN: [id number]  HPI  Patient presents with coughing congestion headache sinuses and allergies for the past 5 days. He has squamous cell carcinoma of the esophagus status post treatment and now has a PEG tube. His appetite has been decreased over the past week he denies any fever but has had chills. He has not taken any over-the-counter medications. No known sick contacts. Had 1 episode of loose stools. Feels like he is not improving  Review of Systems - per above   GEN- + fatigue, fever, weight loss,weakness, recent illness HEENT- denies eye drainage, change in vision, +nasal discharge, CVS- denies chest pain, palpitations RESP- denies SOB, +cough, wheeze ABD- denies N/V, +change in stools, abd pain Neuro- denies headache, dizziness, syncope, seizure activity      Objective:   Physical Exam  GEN- NAD, alert and oriented x3, + weight gain HEENT- PERRL, EOMI, non injected sclera, pink conjunctiva, MMM, oropharynx mild injection, white discoloring to tonguTM clear bilat no effusion, no maxillary sinus tenderness,clear rhinorrhea Neck- Supple, no LAD CVS- RRR, no murmur RESP-CTAB, upper airway congestion ABD-NABS,soft,NT,ND EXT- No edema Pulses- Radial 2+          Assessment & Plan:

## 2013-04-02 NOTE — Patient Instructions (Addendum)
Take the antibiotics Pick up robitussin Take the claritin for allergies  You can use salt water spray in nose if needed  F/U in June as previous at [facility]

## 2013-04-03 DIAGNOSIS — J302 Other seasonal allergic rhinitis: Secondary | ICD-10-CM | POA: Insufficient documentation

## 2013-04-03 DIAGNOSIS — J069 Acute upper respiratory infection, unspecified: Secondary | ICD-10-CM | POA: Insufficient documentation

## 2013-04-03 NOTE — Assessment & Plan Note (Signed)
Anti-histamine prescribed

## 2013-04-03 NOTE — Assessment & Plan Note (Addendum)
I think there is a component of allergies as well But he has been immunocompromised and quite ill, I think he is higher risk for decompensation, has history of emphysema as well Will go ahead and treat him with antibiotics, robitussin

## 2013-04-29 NOTE — Progress Notes (Signed)
Eddie Antis, MD [address], [address] [place] [address]  Squamous cell carcinoma of esophagus - Plan: CBC with Differential, Basic metabolic panel, pantoprazole (PROTONIX) 40 MG tablet, CBC with Differential, Comprehensive metabolic panel  CURRENT THERAPY: Surveillance  INTERVAL HISTORY: BRIGHTEN ORNDOFF 67 y.o. 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 Tyrone Sage of thoracic oncology. He is status post 5-FU by constant infusion in conjunction with radiation therapy by Dr. Thersa Salt. He tolerated it overall quite well. We did have to dose reduce him at one time.  His most recent PET scan in Feb 2014 was negative for recurrence or residual disease.  I personally reviewed and went over laboratory results with the patient.  Eddie [name] looks great.  He reports that he feels good as well.  He has gained weight since completing therapy and he is at a healthy weight for his stature.  He reports that he walks outside daily and he was encouraged to continue this activity.   Unfortunately, Eddie [name] continues to smoke tobacco.  Smoking cessation education was provided.  I have asked him to discuss this vise with Dr. Jeanice Lim, PCP.  He may be a candidate for Chantix or Wellbutrin possibly.   He has follow-up with Dr. Karilyn Cota regarding his G-tube.  Together, they have developed a plan for removal of the tube in the future.  I have asked him to continue follow-up with Dr. Karilyn Cota.    Oncologically, he denies any complaints and ROS questioning is negative.   Past Medical History  Diagnosis Date  . Hypertension   . Chronic back pain   . GERD (gastroesophageal reflux disease)   . Squamous cell carcinoma Oct 2014    Esophogus  . Squamous cell carcinoma of esophagus 09/25/2012  . Severe malnutrition 10/26/2012  . Duodenal ulcer 09/2012  . Helicobacter pylori  (H. pylori) infection 09/2012    has Essential hypertension, benign; Alcohol use; Emphysema; Tobacco use; Squamous cell carcinoma of esophagus; Edentulous; Stomatitis and mucositis; Cachexia; Chemotherapy adverse reaction; Severe malnutrition; Abdominal pain, generalized; Duodenal ulcer; Helicobacter pylori (H. pylori) infection; Thrombocytopenia; Acute renal insufficiency; Hyperglycemia; Enteritis; Anemia; URI, acute; and Seasonal allergies on his problem list.     is allergic to motrin.  [name] does not currently have medications on file.  Past Surgical History  Procedure Laterality Date  . Peg tube placement  09/21/2012  . Esophagogastroduodenoscopy  09/21/2012    Procedure: ESOPHAGOGASTRODUODENOSCOPY (EGD);  Surgeon: Malissa Hippo, MD;  Location: [facility];  Service: Endoscopy;  Laterality: N/A;  345  . Peg placement  09/21/2012    Procedure: PERCUTANEOUS ENDOSCOPIC GASTROSTOMY (PEG) PLACEMENT;  Surgeon: Malissa Hippo, MD;  Location: [facility];  Service: Endoscopy;  Laterality: N/A;  . Portacath placement      Denies any headaches, dizziness, double vision, fevers, chills, night sweats, nausea, vomiting, diarrhea, constipation, chest pain, heart palpitations, shortness of breath, blood in stool, black tarry stool, urinary pain, urinary burning, urinary frequency, hematuria.   PHYSICAL EXAMINATION  ECOG PERFORMANCE STATUS: 0 - Asymptomatic  Filed Vitals:   04/30/13 1013  BP: 179/96  Pulse: 89  Temp: 98.1 F (36.7 C)  Resp: 18    GENERAL:alert, no distress, well nourished, well developed, comfortable, cooperative and smiling SKIN: skin color, texture, turgor are normal, no rashes or significant lesions HEAD: Normocephalic, No masses, lesions,  tenderness or abnormalities EYES: normal, EOMI, Conjunctiva are pink and non-injected EARS: External ears normal OROPHARYNX:no exudate, no erythema, lips, buccal mucosa, and tongue normal and mucous membranes are moist   NECK: supple, no adenopathy, thyroid normal size, non-tender, without nodularity, no stridor, non-tender, trachea midline LYMPH:  no palpable lymphadenopathy, no hepatosplenomegaly BREAST:not examined LUNGS: clear to auscultation and percussion HEART: regular rate & rhythm, no murmurs, no gallops, S1 normal and S2 normal ABDOMEN:abdomen soft, non-tender, normal bowel sounds, no masses or organomegaly, G-tube remains and no hepatosplenomegaly BACK: Back symmetric, no curvature., No CVA tenderness EXTREMITIES:less then 2 second capillary refill, no joint deformities, effusion, or inflammation, no edema, no skin discoloration, no clubbing, no cyanosis  NEURO: alert & oriented x 3 with fluent speech, no focal motor/sensory deficits, gait normal    LABORATORY DATA: CBC    Component Value Date/Time   WBC 8.0 01/29/2013 1213   RBC 4.22 01/29/2013 1213   HGB 13.4 01/29/2013 1213   HCT 40.7 01/29/2013 1213   PLT 200 01/29/2013 1213   MCV 96.4 01/29/2013 1213   MCH 31.8 01/29/2013 1213   MCHC 32.9 01/29/2013 1213   RDW 12.6 01/29/2013 1213   LYMPHSABS 1.2 01/29/2013 1213   MONOABS 0.8 01/29/2013 1213   EOSABS 0.1 01/29/2013 1213   BASOSABS 0.1 01/29/2013 1213      Chemistry      Component Value Date/Time   NA 140 01/29/2013 1213   K 3.9 01/29/2013 1213   CL 103 01/29/2013 1213   CO2 29 01/29/2013 1213   BUN 14 01/29/2013 1213   CREATININE 1.05 01/29/2013 1213   CREATININE 1.12 07/30/2012 0950      Component Value Date/Time   CALCIUM 9.7 01/29/2013 1213   ALKPHOS 79 01/29/2013 1213   AST 12 01/29/2013 1213   ALT 8 01/29/2013 1213   BILITOT 0.7 01/29/2013 1213      RADIOGRAPHIC STUDIES:  01/24/2013  *RADIOLOGY REPORT*  Clinical Data: Subsequent treatment strategy for esophageal cancer.  NUCLEAR MEDICINE PET SKULL BASE TO THIGH  Fasting Blood Glucose: 84  Technique: 15.4 mCi F-18 FDG was injected intravenously. CT data  was obtained and used for attenuation correction and anatomic   localization only. (This was not acquired as a diagnostic CT  examination.) Additional exam technical data entered on  technologist worksheet.  Comparison: 10/03/2012  Findings:  Neck: No hypermetabolic lymph nodes in the neck.  Chest: No hypermetabolic mediastinal or hilar nodes. No  suspicious pulmonary nodules on the CT scan.  Abdomen/Pelvis: No abnormal hypermetabolic activity within the  liver, pancreas, adrenal glands, or spleen. No hypermetabolic  lymph nodes in the abdomen or pelvis.  Skeleton: No focal hypermetabolic activity to suggest skeletal  metastasis.  IMPRESSION:  1. There are no specific features identified to suggest residual  or recurrence of hypermetabolic tumor.  Original Report Authenticated By: Signa Kell, M.D.    ASSESSMENT:  1. Unresectable squamous cell carcinoma the upper esophagus with positive nodes high in the paratracheal region not felt to be a surgical candidate by Dr. Tyrone Sage of thoracic oncology. He is status post 5-FU by constant infusion in conjunction with radiation therapy (10/11/2012- 12/10/2012) by Dr. Thersa Salt. He tolerated it overall quite well. We did have to dose reduce him at one time.  His most recent PET scan in Feb 2014 was negative for recurrence or residual disease.  2. G-tube in place.  Seen by Dr. Karilyn Cota who is following and coordinating removal in the future.   Patient Active Problem  List   Diagnosis Date Noted  . URI, acute 04/03/2013  . Seasonal allergies 04/03/2013  . Anemia 11/07/2012  . Enteritis 10/30/2012  . Hyperglycemia 10/29/2012  . Acute renal insufficiency 10/28/2012  . Severe malnutrition 10/26/2012  . Abdominal pain, generalized 10/26/2012  . Duodenal ulcer 10/26/2012  . Helicobacter pylori (H. pylori) infection 10/26/2012  . Thrombocytopenia 10/26/2012  . Chemotherapy adverse reaction 10/25/2012  . Stomatitis and mucositis 10/24/2012  . Cachexia 10/24/2012  . Edentulous 10/02/2012  . Squamous cell  carcinoma of esophagus 09/25/2012  . Essential hypertension, benign 07/29/2012  . Alcohol use 07/29/2012  . Emphysema 07/29/2012  . Tobacco use 07/29/2012     PLAN:  1. I personally reviewed and went over laboratory results with the patient. 2. Labs on May 10, 2013 when he sees PCP, Dr. Jeanice Lim: CBC diff, CMET 3. Continue follow-up with Dr. Karilyn Cota regarding G-Tube. 4. D/C Lorazepam and Ondansetron 5. Refill for Protonix 40 mg daily #30 with 0 refills.  6. Return in 12 weeks for follow-up.  All questions were answered. The patient knows to call the clinic with any problems, questions or concerns. We can certainly see the patient much sooner if necessary.  The patient and plan discussed with Glenford Peers, MD and he is in agreement with the aforementioned.  Kindle Strohmeier

## 2013-04-30 ENCOUNTER — Encounter (HOSPITAL_COMMUNITY): Payer: Medicare Other | Attending: Oncology | Admitting: Oncology

## 2013-04-30 VITALS — BP 179/96 | HR 89 | Temp 98.1°F | Resp 18 | Wt 133.2 lb

## 2013-04-30 DIAGNOSIS — C771 Secondary and unspecified malignant neoplasm of intrathoracic lymph nodes: Secondary | ICD-10-CM

## 2013-04-30 DIAGNOSIS — C159 Malignant neoplasm of esophagus, unspecified: Secondary | ICD-10-CM

## 2013-04-30 DIAGNOSIS — C153 Malignant neoplasm of upper third of esophagus: Secondary | ICD-10-CM

## 2013-04-30 DIAGNOSIS — F17200 Nicotine dependence, unspecified, uncomplicated: Secondary | ICD-10-CM

## 2013-04-30 MED ORDER — PANTOPRAZOLE SODIUM 40 MG PO TBEC: 40.0000 mg | DELAYED_RELEASE_TABLET | Freq: Every day | ORAL | Status: DC

## 2013-04-30 NOTE — Patient Instructions (Addendum)
[facility] Discharge Instructions  RECOMMENDATIONS MADE BY THE CONSULTANT AND ANY TEST RESULTS WILL BE SENT TO YOUR REFERRING PHYSICIAN.  EXAM FINDINGS BY THE PHYSICIAN TODAY AND SIGNS OR SYMPTOMS TO REPORT TO CLINIC OR PRIMARY PHYSICIAN: exam and discussion by PA.  You are doing well.  MEDICATIONS PRESCRIBED:  none  INSTRUCTIONS GIVEN AND DISCUSSED: When you see Dr. Jeanice Lim on 6/6 see if they will get your blood work. Report increased difficulty swallowing, unexplained weight loss or other problems.   SPECIAL INSTRUCTIONS/FOLLOW-UP: Follow-up here in 3 months.  Thank you for choosing [facility] to provide your oncology and hematology care.  To afford each patient quality time with our providers, please arrive at least 15 minutes before your scheduled appointment time.  With your help, our goal is to use those 15 minutes to complete the necessary work-up to ensure our physicians have the information they need to help with your evaluation and healthcare recommendations.    Effective January 1st, 2014, we ask that you re-schedule your appointment with our physicians should you arrive 10 or more minutes late for your appointment.  We strive to give you quality time with our providers, and arriving late affects you and other patients whose appointments are after yours.    Again, thank you for choosing [facility].  Our hope is that these requests will decrease the amount of time that you wait before being seen by our physicians.       _____________________________________________________________  Should you have questions after your visit to [facility], please contact our office at [phone] between the hours of 8:30 a.m. and 5:00 p.m.  Voicemails left after 4:30 p.m. will not be returned until the following business day.  For prescription refill requests, have your pharmacy contact our office with your prescription refill  request.

## 2013-05-10 ENCOUNTER — Ambulatory Visit: Payer: Self-pay | Admitting: Family Medicine

## 2013-05-10 ENCOUNTER — Other Ambulatory Visit (HOSPITAL_COMMUNITY): Payer: Self-pay

## 2013-05-10 ENCOUNTER — Ambulatory Visit (INDEPENDENT_AMBULATORY_CARE_PROVIDER_SITE_OTHER): Payer: Medicare Other | Admitting: Family Medicine

## 2013-05-10 VITALS — BP 128/70 | HR 67 | Temp 97.4°F | Resp 18 | Ht 67.0 in | Wt 131.0 lb

## 2013-05-10 DIAGNOSIS — J438 Other emphysema: Secondary | ICD-10-CM

## 2013-05-10 DIAGNOSIS — E41 Nutritional marasmus: Secondary | ICD-10-CM

## 2013-05-10 DIAGNOSIS — I10 Essential (primary) hypertension: Secondary | ICD-10-CM

## 2013-05-10 DIAGNOSIS — Z720 Tobacco use: Secondary | ICD-10-CM

## 2013-05-10 DIAGNOSIS — J439 Emphysema, unspecified: Secondary | ICD-10-CM

## 2013-05-10 DIAGNOSIS — E43 Unspecified severe protein-calorie malnutrition: Secondary | ICD-10-CM

## 2013-05-10 DIAGNOSIS — F17200 Nicotine dependence, unspecified, uncomplicated: Secondary | ICD-10-CM

## 2013-05-10 LAB — COMPREHENSIVE METABOLIC PANEL
ALT: <8 U/L (ref 0–53)
AST: 14 U/L (ref 0–37)
Alkaline Phosphatase: 82 U/L (ref 39–117)
Potassium: 5.1 mEq/L (ref 3.5–5.3)
Sodium: 142 mEq/L (ref 135–145)
Total Bilirubin: 0.8 mg/dL (ref 0.3–1.2)
Total Protein: 6.9 g/dL (ref 6.0–8.3)

## 2013-05-10 LAB — CBC WITH DIFFERENTIAL/PLATELET
Basophils Relative: 0 % (ref 0–1)
Eosinophils Absolute: 0.2 10*3/uL (ref 0.0–0.7)
HCT: 40.2 % (ref 39.0–52.0)
Hemoglobin: 13.5 g/dL (ref 13.0–17.0)
MCV: 91.6 fL (ref 78.0–100.0)
Neutro Abs: 5.9 10*3/uL (ref 1.7–7.7)
Neutrophils Relative %: 66 % (ref 43–77)
Platelets: 228 10*3/uL (ref 150–400)
WBC: 9.2 10*3/uL (ref 4.0–10.5)

## 2013-05-10 MED ORDER — TERAZOSIN HCL 1 MG PO CAPS: 1.0000 mg | ORAL_CAPSULE | Freq: Every day | ORAL | Status: DC

## 2013-05-10 NOTE — Patient Instructions (Signed)
Add a multivitamin once a day  Restart blood pressure \/prostate medication at bedtime Labs done today, we call with results  F/U 6 months

## 2013-05-10 NOTE — Progress Notes (Signed)
  Subjective:    Patient ID: Eddie [name], male    DOB: December 16, 1945, 67 y.o.   MRN: [id number]  HPI Patient here follow chronic medical problems. He has no specific concerns. He is doing well. He is no longer using his feeding tube and plans to have this removed in August. He's been out of his blood pressure/prostate medication. He was recently seen by oncology and had a good report. They have asked for some blood work to be drawn today. Medications reviewed   Review of Systems  GEN- denies fatigue, fever, weight loss,weakness, recent illness HEENT- denies eye drainage, change in vision, nasal discharge, CVS- denies chest pain, palpitations RESP- denies SOB, cough, wheeze ABD- denies N/V, change in stools, abd pain GU- denies dysuria, hematuria, dribbling, incontinence MSK- denies joint pain, muscle aches, injury Neuro- denies headache, dizziness, syncope, seizure activity      Objective:   Physical Exam  GEN- NAD, alert and oriented x3, +weight gain HEENT- PERRL, EOMI, non injected sclera, pink conjunctiva, MMM, oropharynx clear, Neck- Supple,  CVS- RRR, no murmur RESP-CTAB ABD-NABS,soft,NT,ND, Peg tube in place EXT- No edema Pulses- Radial, DP- 2+       Assessment & Plan:

## 2013-05-11 ENCOUNTER — Encounter: Payer: Self-pay | Admitting: Family Medicine

## 2013-05-11 NOTE — Assessment & Plan Note (Signed)
Currently stable, no meds needed, he smokes very little

## 2013-05-11 NOTE — Assessment & Plan Note (Signed)
Does not require tube feeds any longer, weight has picked up, plan for peg removal Doing very well

## 2013-05-11 NOTE — Assessment & Plan Note (Signed)
Continue to work on complete cessation 

## 2013-05-11 NOTE — Assessment & Plan Note (Signed)
BP looks okay but has been elevated at specialist office, restart at bedtime

## 2013-07-15 ENCOUNTER — Ambulatory Visit (INDEPENDENT_AMBULATORY_CARE_PROVIDER_SITE_OTHER): Payer: Medicare Other | Admitting: Internal Medicine

## 2013-07-15 ENCOUNTER — Encounter (INDEPENDENT_AMBULATORY_CARE_PROVIDER_SITE_OTHER): Payer: Self-pay | Admitting: Internal Medicine

## 2013-07-15 VITALS — BP 140/90 | HR 68 | Temp 97.8°F | Resp 18 | Ht 66.0 in | Wt 128.1 lb

## 2013-07-15 DIAGNOSIS — Z431 Encounter for attention to gastrostomy: Secondary | ICD-10-CM

## 2013-07-15 NOTE — Progress Notes (Signed)
Presenting complaint;  For G-tube removal.  Subjective:  Patient is 67 year old African American male who was diagnosed with squamous cell carcinoma of the esophagus in October last year and underwent chemotherapy and radiation therapy and he responded. PET scan in January 2014 was negative. I last saw him in April 2014 and recommended leaving the G-tube and little bit longer. He has not used G-tube in over 2 months. He has no difficulty swallowing whatsoever. He also denies heartburn nausea or vomiting. His appetite is good and his weight is back to his baseline weight. He denies melena or rectal bleeding. He's never been screened for CRC.  Current Medications: Current Outpatient Prescriptions  Medication Sig Dispense Refill  . Cyanocobalamin (VITAMIN B 12 PO) Take 1 tablet by mouth daily.      . diphenoxylate-atropine (LOMOTIL) 2.5-0.025 MG per tablet Take 1 tablet by mouth 4 (four) times daily as needed for diarrhea or loose stools.  30 tablet  1  . terazosin (HYTRIN) 1 MG capsule Take 1 capsule (1 mg total) by mouth at bedtime.  30 capsule  6  . Water For Irrigation, Sterile (FREE WATER) SOLN Place 250 mLs into feeding tube every 6 (six) hours.       No current facility-administered medications for this visit.     Objective: Blood pressure 140/90, pulse 68, temperature 97.8 F (36.6 C), temperature source Oral, resp. rate 18, height 5\' 6"  (1.676 m), weight 128 lb 1.6 oz (58.106 kg). Patient is alert and in no acute distress. Conjunctiva is pink. Sclera is nonicteric He has small sebaceous cyst between medial ends of eyebrows. Oropharyngeal mucosa is normal. He is edentulous. No neck masses or thyromegaly noted. Cardiac exam with regular rhythm normal S1 and S2. No murmur or gallop noted. Lungs are clear to auscultation. Abdomen. G-tube is in place. Stoma appears to be healthy. Abdomen is soft without organomegaly or masses the  No LE edema or clubbing  noted.    Assessment:  #1. History of squamous cell carcinoma of the esophagus. He received chemotherapy and radiation therapy and is in remission. PET scan in January 2014 was negative. He has no swallowing difficulty and has not used PEG tube for over 2 months. Therefore would be reasonable to remove G-tube. #2. CRC screening. He has never been screened for CRC. Family history is negative.   Plan:   Patient has traction pull-type G-tube which was pulled out without any difficulty. Site was cleaned and sterile dressing applied. Patient advised to clean G. site with alcohol and keep it covered until it heals. Screening colonoscopy in November 2014.

## 2013-07-15 NOTE — Patient Instructions (Addendum)
Clean G-tube site with alcohol and keep it covered until healed completely. Colonoscopy to be scheduled in November 2014

## 2013-07-31 ENCOUNTER — Encounter (HOSPITAL_COMMUNITY): Payer: Medicare Other | Attending: Oncology | Admitting: Oncology

## 2013-07-31 ENCOUNTER — Encounter (HOSPITAL_COMMUNITY): Payer: Self-pay | Admitting: Oncology

## 2013-07-31 VITALS — BP 161/101 | HR 80 | Resp 20 | Wt 128.1 lb

## 2013-07-31 DIAGNOSIS — C153 Malignant neoplasm of upper third of esophagus: Secondary | ICD-10-CM

## 2013-07-31 DIAGNOSIS — C159 Malignant neoplasm of esophagus, unspecified: Secondary | ICD-10-CM

## 2013-07-31 DIAGNOSIS — C771 Secondary and unspecified malignant neoplasm of intrathoracic lymph nodes: Secondary | ICD-10-CM

## 2013-07-31 NOTE — Progress Notes (Signed)
Eddie Antis, MD [address] [place] [address]  Squamous cell carcinoma of esophagus - Plan: CBC with Differential, Comprehensive metabolic panel  CURRENT THERAPY:Surveillance   INTERVAL HISTORY: Eddie [name] 67 y.o. 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 Tyrone [name] of thoracic surgery. He is status post 5-FU by constant infusion (10/11/2012- 12/10/2012) in conjunction with radiation therapy by Dr. Thersa [name].  He had difficulties during treatment secondary to toxicities, but he did complete the course of chemoradiation. We did have to dose reduce him at one time (11/12/2012). His most recent PET scan in Feb 2014 was negative for recurrence or residual disease.  Dr. Karilyn [name] removed the patient's PEG tube on 07/15/2013.  I personally reviewed and went over laboratory results with the patient. Lab work performed 2 months ago was WNL without any abnormalities in metabolic panel or CBC.  Eddie [name] looks great and feels wonderful.  His appetite is strong with a stable weight.  He Denies any hemoptysis, dysphagia, new lumps or bumps, pain, difficulty eating, decreased appetite, etc.  Oncologic ROS questioning is negative.   He reports that he is undergoing a colonoscopy in November with Dr. Karilyn [name].  I provided him education regarding the procedure and its importance in screening for colon cancer and prevention of colon cancer.  He was educated about the prep the day prior to the procedure and the importance of a good prep.  I recommended moist toilettes for anal cleaning during the prep time.   Will follow NCCN guidelines for surveillance.   Past Medical History  Diagnosis Date  . Hypertension   . Chronic back pain   . GERD (gastroesophageal reflux disease)   . Squamous cell carcinoma Oct 2014    Esophogus  . Squamous cell  carcinoma of esophagus 09/25/2012  . Severe malnutrition 10/26/2012  . Duodenal ulcer 09/2012  . Helicobacter pylori (H. pylori) infection 09/2012    has Essential hypertension, benign; Alcohol use; Emphysema; Tobacco use; Squamous cell carcinoma of esophagus; Edentulous; Duodenal ulcer; Helicobacter pylori (H. pylori) infection; and Seasonal allergies on his problem list.     is allergic to motrin.  Eddie [name] had no medications administered during this visit.  Past Surgical History  Procedure Laterality Date  . Peg tube placement  09/21/2012  . Esophagogastroduodenoscopy  09/21/2012    Procedure: ESOPHAGOGASTRODUODENOSCOPY (EGD);  Surgeon: Eddie Hippo, MD;  Location: [facility];  Service: Endoscopy;  Laterality: N/A;  345  . Peg placement  09/21/2012    Procedure: PERCUTANEOUS ENDOSCOPIC GASTROSTOMY (PEG) PLACEMENT;  Surgeon: Eddie Hippo, MD;  Location: [facility];  Service: Endoscopy;  Laterality: N/A;  . Portacath placement    . Removal of gastrostomy tube      august 2014    Denies any headaches, dizziness, double vision, fevers, chills, night sweats, nausea, vomiting, diarrhea, constipation, chest pain, heart palpitations, shortness of breath, blood in stool, black tarry stool, urinary pain, urinary burning, urinary frequency, hematuria.   PHYSICAL EXAMINATION  ECOG PERFORMANCE STATUS: 0 - Asymptomatic  Filed Vitals:   07/31/13 1037  BP: 161/101  Pulse: 80  Resp: 20    GENERAL:alert, healthy, no distress, well nourished, well developed, comfortable, cooperative and smiling SKIN: skin color, texture, turgor are normal, no rashes or significant lesions HEAD: Normocephalic, No masses, lesions, tenderness or abnormalities  EYES: normal, PERRLA, EOMI, Conjunctiva are pink and non-injected EARS: External ears normal OROPHARYNX:mucous membranes are moist  NECK: supple, no adenopathy, thyroid normal size, non-tender, without nodularity, no stridor, non-tender,  trachea midline LYMPH:  no palpable lymphadenopathy, no hepatosplenomegaly BREAST:not examined LUNGS: clear to auscultation and percussion HEART: regular rate & rhythm, no murmurs, no gallops, S1 normal and S2 normal ABDOMEN:abdomen soft, non-tender, normal bowel sounds, no masses or organomegaly and no hepatosplenomegaly BACK: Back symmetric, no curvature., No CVA tenderness EXTREMITIES:less then 2 second capillary refill, no joint deformities, effusion, or inflammation, no edema, no skin discoloration, no clubbing, no cyanosis  NEURO: alert & oriented x 3 with fluent speech, no focal motor/sensory deficits, gait normal    LABORATORY DATA: CBC    Component Value Date/Time   WBC 9.2 05/10/2013 1244   RBC 4.39 05/10/2013 1244   HGB 13.5 05/10/2013 1244   HCT 40.2 05/10/2013 1244   PLT 228 05/10/2013 1244   MCV 91.6 05/10/2013 1244   MCH 30.8 05/10/2013 1244   MCHC 33.6 05/10/2013 1244   RDW 12.8 05/10/2013 1244   LYMPHSABS 2.1 05/10/2013 1244   MONOABS 0.8 05/10/2013 1244   EOSABS 0.2 05/10/2013 1244   BASOSABS 0.0 05/10/2013 1244      Chemistry      Component Value Date/Time   NA 142 05/10/2013 1244   K 5.1 05/10/2013 1244   CL 106 05/10/2013 1244   CO2 24 05/10/2013 1244   BUN 14 05/10/2013 1244   CREATININE 1.19 05/10/2013 1244   CREATININE 1.05 01/29/2013 1213      Component Value Date/Time   CALCIUM 9.8 05/10/2013 1244   ALKPHOS 82 05/10/2013 1244   AST 14 05/10/2013 1244   ALT <8 05/10/2013 1244   BILITOT 0.8 05/10/2013 1244       RADIOGRAPHIC STUDIES:  01/24/2013  *RADIOLOGY REPORT*  Clinical Data: Subsequent treatment strategy for esophageal cancer.  NUCLEAR MEDICINE PET SKULL BASE TO THIGH  Fasting Blood Glucose: 84  Technique: 15.4 mCi F-18 FDG was injected intravenously. CT data  was obtained and used for attenuation correction and anatomic  localization only. (This was not acquired as a diagnostic CT  examination.) Additional exam technical data entered on  technologist worksheet.    Comparison: 10/03/2012  Findings:  Neck: No hypermetabolic lymph nodes in the neck.  Chest: No hypermetabolic mediastinal or hilar nodes. No  suspicious pulmonary nodules on the CT scan.  Abdomen/Pelvis: No abnormal hypermetabolic activity within the  liver, pancreas, adrenal glands, or spleen. No hypermetabolic  lymph nodes in the abdomen or pelvis.  Skeleton: No focal hypermetabolic activity to suggest skeletal  metastasis.  IMPRESSION:  1. There are no specific features identified to suggest residual  or recurrence of hypermetabolic tumor.  Original Report Authenticated By: Signa Kell, M.D.    PATHOLOGY:  09/13/2012  Diagnosis Esophagus, biopsy, mass SQUAMOUS CELL CARCINOMA. PLEASE SEE COMMENT. Microscopic Comment The biopsies are composed of superficial fragments of invasive moderately differentiated squamous cell carcinoma with associated brisk mitotic activity, cytologic atypia and focal keratin pearl formation. There is no evidence of angiolymphatic invasion or perineural invasion identified in this material. The case was discussed with Dr. Karilyn [name] on 09/17/12. (HCL:caf 09/17/12) Abigail Miyamoto MD Pathologist, Electronic Signature (Case signed 09/17/2012)     ASSESSMENT:  1. Unresectable squamous cell carcinoma the upper esophagus with positive nodes high in the paratracheal region not felt to be a surgical candidate by Dr. Tyrone [name] of thoracic surgery. He is status post 5-FU by  constant infusion (10/11/2012- 12/10/2012) in conjunction with radiation therapy by Dr. Thersa [name].  He had difficulties during treatment secondary to toxicities, but he did complete the course of chemoradiation. We did have to dose reduce him at one time (11/12/2012). His most recent PET scan in Feb 2014 was negative for recurrence or residual disease.  Patient Active Problem List   Diagnosis Date Noted  . Seasonal allergies 04/03/2013  . Duodenal ulcer 10/26/2012  . Helicobacter pylori (H.  pylori) infection 10/26/2012  . Edentulous 10/02/2012  . Squamous cell carcinoma of esophagus 09/25/2012  . Essential hypertension, benign 07/29/2012  . Alcohol use 07/29/2012  . Emphysema 07/29/2012  . Tobacco use 07/29/2012     PLAN:  1. I personally reviewed and went over laboratory results with the patient. 2. I personally reviewed and went over radiographic studies with the patient. 3. Labs in 3 months: CBC diff, CMET 4. Patient education regarding the role of screening colonoscopies. 5. Patient reports a colonoscopy is scheduled fro him in November by Dr. Karilyn [name] 6. I resolved a number of the patient's problems on his problem list as they are no longer issues now that he has completed chemotherapy.  7. Return in 3 months for follow-up.   THERAPY PLAN:  Per NCCN guidelines, we will continue to see the patient every 3 months for a total of 2 years followed by every 6 months for a total of 3 years (5 years total), then annually.  Imaging studies and EGD +/- dilation as clinically indicated.  All questions were answered. The patient knows to call the clinic with any problems, questions or concerns. We can certainly see the patient much sooner if necessary.  Patient and plan discussed with Dr. Erline Hau and he is in agreement with the aforementioned.   Jalexis Breed

## 2013-07-31 NOTE — Patient Instructions (Addendum)
[facility] Discharge Instructions  RECOMMENDATIONS MADE BY THE CONSULTANT AND ANY TEST RESULTS WILL BE SENT TO YOUR REFERRING PHYSICIAN.  EXAM FINDINGS BY THE PHYSICIAN TODAY AND SIGNS OR SYMPTOMS TO REPORT TO CLINIC OR PRIMARY PHYSICIAN: Exam and discussion by Dellis Anes, PA-C.  You are doing well.   MEDICATIONS PRESCRIBED:  none  INSTRUCTIONS GIVEN AND DISCUSSED: Report swallowing difficulty, unexplained weight loss, etc.  SPECIAL INSTRUCTIONS/FOLLOW-UP: Follow-up with blood work and MD visit in 3 months.  Thank you for choosing [facility] to provide your oncology and hematology care.  To afford each patient quality time with our providers, please arrive at least 15 minutes before your scheduled appointment time.  With your help, our goal is to use those 15 minutes to complete the necessary work-up to ensure our physicians have the information they need to help with your evaluation and healthcare recommendations.    Effective January 1st, 2014, we ask that you re-schedule your appointment with our physicians should you arrive 10 or more minutes late for your appointment.  We strive to give you quality time with our providers, and arriving late affects you and other patients whose appointments are after yours.    Again, thank you for choosing [facility].  Our hope is that these requests will decrease the amount of time that you wait before being seen by our physicians.       _____________________________________________________________  Should you have questions after your visit to [facility], please contact our office at [phone] between the hours of 8:30 a.m. and 5:00 p.m.  Voicemails left after 4:30 p.m. will not be returned until the following business day.  For prescription refill requests, have your pharmacy contact our office with your prescription refill request.

## 2013-09-04 DIAGNOSIS — IMO0002 Reserved for concepts with insufficient information to code with codable children: Secondary | ICD-10-CM

## 2013-09-04 HISTORY — DX: Reserved for concepts with insufficient information to code with codable children: IMO0002

## 2013-10-08 ENCOUNTER — Encounter (INDEPENDENT_AMBULATORY_CARE_PROVIDER_SITE_OTHER): Payer: Self-pay | Admitting: *Deleted

## 2013-10-17 ENCOUNTER — Other Ambulatory Visit (INDEPENDENT_AMBULATORY_CARE_PROVIDER_SITE_OTHER): Payer: Self-pay | Admitting: *Deleted

## 2013-10-17 ENCOUNTER — Telehealth (INDEPENDENT_AMBULATORY_CARE_PROVIDER_SITE_OTHER): Payer: Self-pay | Admitting: *Deleted

## 2013-10-17 DIAGNOSIS — Z1211 Encounter for screening for malignant neoplasm of colon: Secondary | ICD-10-CM

## 2013-10-17 MED ORDER — PEG-KCL-NACL-NASULF-NA ASC-C 100 G PO SOLR: 1.0000 | Freq: Once | ORAL | Status: DC

## 2013-10-17 NOTE — Telephone Encounter (Signed)
Patient needs movi prep 

## 2013-10-28 ENCOUNTER — Encounter (HOSPITAL_COMMUNITY): Payer: Self-pay

## 2013-10-28 ENCOUNTER — Encounter (HOSPITAL_BASED_OUTPATIENT_CLINIC_OR_DEPARTMENT_OTHER): Payer: Medicare Other

## 2013-10-28 ENCOUNTER — Encounter (HOSPITAL_COMMUNITY): Payer: Medicare Other | Attending: Hematology and Oncology

## 2013-10-28 VITALS — BP 161/98 | HR 77 | Temp 98.2°F | Resp 18 | Wt 132.3 lb

## 2013-10-28 DIAGNOSIS — Z09 Encounter for follow-up examination after completed treatment for conditions other than malignant neoplasm: Secondary | ICD-10-CM | POA: Insufficient documentation

## 2013-10-28 DIAGNOSIS — Z8501 Personal history of malignant neoplasm of esophagus: Secondary | ICD-10-CM | POA: Insufficient documentation

## 2013-10-28 DIAGNOSIS — C159 Malignant neoplasm of esophagus, unspecified: Secondary | ICD-10-CM

## 2013-10-28 DIAGNOSIS — F17200 Nicotine dependence, unspecified, uncomplicated: Secondary | ICD-10-CM

## 2013-10-28 LAB — CBC WITH DIFFERENTIAL/PLATELET
HCT: 50.8 % (ref 39.0–52.0)
Hemoglobin: 17.1 g/dL — ABNORMAL HIGH (ref 13.0–17.0)
Lymphs Abs: 2.2 10*3/uL (ref 0.7–4.0)
MCHC: 33.7 g/dL (ref 30.0–36.0)
MCV: 100.8 fL — ABNORMAL HIGH (ref 78.0–100.0)
Monocytes Absolute: 0.6 10*3/uL (ref 0.1–1.0)
Monocytes Relative: 7 % (ref 3–12)
Neutro Abs: 5.7 10*3/uL (ref 1.7–7.7)
Neutrophils Relative %: 65 % (ref 43–77)
RBC: 5.04 MIL/uL (ref 4.22–5.81)

## 2013-10-28 LAB — COMPREHENSIVE METABOLIC PANEL
ALT: 9 U/L (ref 0–53)
Albumin: 4.2 g/dL (ref 3.5–5.2)
Alkaline Phosphatase: 87 U/L (ref 39–117)
BUN: 12 mg/dL (ref 6–23)
CO2: 27 mEq/L (ref 19–32)
Chloride: 103 mEq/L (ref 96–112)
Creatinine, Ser: 1.22 mg/dL (ref 0.50–1.35)
GFR calc Af Amer: 70 mL/min — ABNORMAL LOW (ref >90)
GFR calc non Af Amer: 61 mL/min — ABNORMAL LOW (ref >90)
Glucose, Bld: 107 mg/dL — ABNORMAL HIGH (ref 70–99)
Potassium: 3.6 mEq/L (ref 3.5–5.1)
Total Bilirubin: 0.9 mg/dL (ref 0.3–1.2)

## 2013-10-28 NOTE — Patient Instructions (Signed)
.  [facility] Discharge Instructions  RECOMMENDATIONS MADE BY THE CONSULTANT AND ANY TEST RESULTS WILL BE SENT TO YOUR REFERRING PHYSICIAN.  EXAM FINDINGS BY THE PHYSICIAN TODAY AND SIGNS OR SYMPTOMS TO REPORT TO CLINIC OR PRIMARY PHYSICIAN:  Exam and findings as discussed by Dr. Zigmund Daniel.  CT scans to be done   INSTRUCTIONS/FOLLOW-UP: Dr. Zigmund Daniel will talk to Dr. Karilyn Cota about doing an upper Endoscopy at the same time as your colonoscopy. 3 months to see [place] back  Thank you for choosing [facility] to provide your oncology and hematology care.  To afford each patient quality time with our providers, please arrive at least 15 minutes before your scheduled appointment time.  With your help, our goal is to use those 15 minutes to complete the necessary work-up to ensure our physicians have the information they need to help with your evaluation and healthcare recommendations.    Effective January 1st, 2014, we ask that you re-schedule your appointment with our physicians should you arrive 10 or more minutes late for your appointment.  We strive to give you quality time with our providers, and arriving late affects you and other patients whose appointments are after yours.    Again, thank you for choosing [facility].  Our hope is that these requests will decrease the amount of time that you wait before being seen by our physicians.       _____________________________________________________________  Should you have questions after your visit to [facility], please contact our office at [phone] between the hours of 8:30 a.m. and 5:00 p.m.  Voicemails left after 4:30 p.m. will not be returned until the following business day.  For prescription refill requests, have your pharmacy contact our office with your prescription refill request.

## 2013-10-28 NOTE — Progress Notes (Signed)
Labs drawn today for cbc/diff,cmp 

## 2013-10-28 NOTE — Progress Notes (Signed)
[facility]  OFFICE PROGRESS NOTE  Milinda Antis, MD [address] [place] [address]  DIAGNOSIS: Squamous cell carcinoma of esophagus - Plan: CT Abdomen Pelvis W Contrast, CT Chest W Contrast  Chief Complaint  Patient presents with  . Esophageal Cancer    CURRENT THERAPY: Watchful expectation.  INTERVAL HISTORY: Eddie [name] 67 y.o. male returns for followup of squamous cell carcinoma of the esophagus, status post combined modality therapy with continuous infusion 5-FU plus external beam radiotherapy with PET CT scan done in February 2014 showing no evidence of disease.  He continues to do well with no dysphagia, regurgitation, chest pain, abdominal pain, nausea, vomiting, diarrhea, constipation, lower extremity swelling or redness, peripheral paresthesias, sore mouth, cough, wheezing, PND, orthopnea, palpitations, joint pain, skin rash, headache, or seizures.  MEDICAL HISTORY: Past Medical History  Diagnosis Date  . Hypertension   . Chronic back pain   . GERD (gastroesophageal reflux disease)   . Squamous cell carcinoma Oct 2014    Esophogus  . Squamous cell carcinoma of esophagus 09/25/2012  . Severe malnutrition 10/26/2012  . Duodenal ulcer 09/2012  . Helicobacter pylori (H. pylori) infection 09/2012    INTERIM HISTORY: has Essential hypertension, benign; Alcohol use; Emphysema; Tobacco use; Squamous cell carcinoma of esophagus; Edentulous; Duodenal ulcer; Helicobacter pylori (H. pylori) infection; and Seasonal allergies on his problem list.   67 y.o. male returns for regular visit for followup of unresectable squamous cell carcinoma the upper esophagus with positive nodes high in the paratracheal region not felt to be a surgical candidate by Dr. Tyrone Sage of thoracic surgery. He is status post 5-FU by constant infusion (10/11/2012- 12/10/2012) in conjunction with radiation therapy by Dr. Thersa Salt. He had difficulties during  treatment secondary to toxicities, but he did complete the course of chemoradiation. We did have to dose reduce him at one time (11/12/2012). His most recent PET scan in Feb 2014 was negative for recurrence or residual disease  ALLERGIES:  is allergic to motrin.  MEDICATIONS: has a current medication list which includes the following prescription(s): terazosin.  SURGICAL HISTORY:  Past Surgical History  Procedure Laterality Date  . Peg tube placement  09/21/2012  . Esophagogastroduodenoscopy  09/21/2012    Procedure: ESOPHAGOGASTRODUODENOSCOPY (EGD);  Surgeon: Malissa Hippo, MD;  Location: [facility];  Service: Endoscopy;  Laterality: N/A;  345  . Peg placement  09/21/2012    Procedure: PERCUTANEOUS ENDOSCOPIC GASTROSTOMY (PEG) PLACEMENT;  Surgeon: Malissa Hippo, MD;  Location: [facility];  Service: Endoscopy;  Laterality: N/A;  . Portacath placement    . Removal of gastrostomy tube      august 2014    FAMILY HISTORY: family history includes Cancer in his brother; Hypertension in his father; Kidney disease in his daughter; Stroke in his mother.  SOCIAL HISTORY:  reports that he has been smoking.  He has never used smokeless tobacco. He reports that he does not drink alcohol or use illicit drugs.  REVIEW OF SYSTEMS:  Other than that discussed above is noncontributory.  PHYSICAL EXAMINATION: ECOG PERFORMANCE STATUS: 0 - Asymptomatic  Blood pressure 161/98, pulse 77, temperature 98.2 F (36.8 C), resp. rate 18, weight 132 lb 4.8 oz (60.011 kg).  GENERAL:alert, no distress and comfortable. Thin SKIN: skin color, texture, turgor are normal, no rashes or significant lesions EYES: PERLA; Conjunctiva are pink and non-injected, sclera clear OROPHARYNX:no exudate, no erythema on lips, buccal mucosa, or tongue.  NECK: supple, thyroid normal size, non-tender, without nodularity. No masses CHEST: Increased AP diameter with no gynecomastia LYMPH:  no palpable lymphadenopathy in the  cervical, axillary or inguinal LUNGS: clear to auscultation and percussion with normal breathing effort HEART: regular rate & rhythm and no murmurs. ABDOMEN:abdomen soft, non-tender and normal bowel sounds MUSCULOSKELETAL:no cyanosis of digits and no clubbing. Range of motion normal.  NEURO: alert & oriented x 3 with fluent speech, no focal motor/sensory deficits   LABORATORY DATA: Infusion on 10/28/2013  Component Date Value Range Status  . WBC 10/28/2013 8.7  4.0 - 10.5 K/uL Final  . RBC 10/28/2013 5.04  4.22 - 5.81 MIL/uL Final  . Hemoglobin 10/28/2013 17.1* 13.0 - 17.0 g/dL Final  . HCT 81/19/1478 50.8  39.0 - 52.0 % Final  . MCV 10/28/2013 100.8* 78.0 - 100.0 fL Final  . MCH 10/28/2013 33.9  26.0 - 34.0 pg Final  . MCHC 10/28/2013 33.7  30.0 - 36.0 g/dL Final  . RDW 29/56/2130 12.3  11.5 - 15.5 % Final  . Platelets 10/28/2013 218  150 - 400 K/uL Final  . Neutrophils Relative % 10/28/2013 65  43 - 77 % Final  . Neutro Abs 10/28/2013 5.7  1.7 - 7.7 K/uL Final  . Lymphocytes Relative 10/28/2013 25  12 - 46 % Final  . Lymphs Abs 10/28/2013 2.2  0.7 - 4.0 K/uL Final  . Monocytes Relative 10/28/2013 7  3 - 12 % Final  . Monocytes Absolute 10/28/2013 0.6  0.1 - 1.0 K/uL Final  . Eosinophils Relative 10/28/2013 2  0 - 5 % Final  . Eosinophils Absolute 10/28/2013 0.2  0.0 - 0.7 K/uL Final  . Basophils Relative 10/28/2013 1  0 - 1 % Final  . Basophils Absolute 10/28/2013 0.1  0.0 - 0.1 K/uL Final  . Sodium 10/28/2013 140  135 - 145 mEq/L Final  . Potassium 10/28/2013 3.6  3.5 - 5.1 mEq/L Final  . Chloride 10/28/2013 103  96 - 112 mEq/L Final  . CO2 10/28/2013 27  19 - 32 mEq/L Final  . Glucose, Bld 10/28/2013 107* 70 - 99 mg/dL Final  . BUN 86/57/8469 12  6 - 23 mg/dL Final  . Creatinine, Ser 10/28/2013 1.22  0.50 - 1.35 mg/dL Final  . Calcium 62/95/2841 9.6  8.4 - 10.5 mg/dL Final  . Total Protein 10/28/2013 7.5  6.0 - 8.3 g/dL Final  . Albumin 32/44/0102 4.2  3.5 - 5.2 g/dL Final    . AST 72/53/6644 17  0 - 37 U/L Final  . ALT 10/28/2013 9  0 - 53 U/L Final  . Alkaline Phosphatase 10/28/2013 87  39 - 117 U/L Final  . Total Bilirubin 10/28/2013 0.9  0.3 - 1.2 mg/dL Final  . GFR calc non Af Amer 10/28/2013 61* >90 mL/min Final  . GFR calc Af Amer 10/28/2013 70* >90 mL/min Final   Comment: (NOTE)                          The eGFR has been calculated using the CKD EPI equation.                          This calculation has not been validated in all clinical situations.                          eGFR's persistently <90 mL/min signify possible Chronic Kidney  Disease.    PATHOLOGY: None new  Urinalysis    Component Value Date/Time   COLORURINE YELLOW 10/26/2012 1539   APPEARANCEUR CLEAR 10/26/2012 1539   LABSPEC >1.030* 10/26/2012 1539   PHURINE 5.5 10/26/2012 1539   GLUCOSEU NEGATIVE 10/26/2012 1539   HGBUR TRACE* 10/26/2012 1539   BILIRUBINUR NEGATIVE 10/26/2012 1539   KETONESUR NEGATIVE 10/26/2012 1539   PROTEINUR NEGATIVE 10/26/2012 1539   UROBILINOGEN 0.2 10/26/2012 1539   NITRITE NEGATIVE 10/26/2012 1539   LEUKOCYTESUR NEGATIVE 10/26/2012 1539    RADIOGRAPHIC STUDIES: No results found.  ASSESSMENT:  #1. Locally advanced squamous cell carcinoma of the upper esophagus, status post continuous 5-FU infusion plus extremity radiotherapy, no evidence of disease with negative PET scan in February of 2014 per #2. Moderate obstructive pulmonary disease, continues to smoke. #3. History of duodenal ulcer and Helicobacter pylori infection, resolved. #4. Hypertension, controlled.   PLAN:  #1. Repeat CT scan of the chest abdomen and pelvis with contrast. #2. Patient is already scheduled for colonoscopy on 12/26/2013. Suggest undergoing upper endoscopy at the same time to assess response from previous radiation and chemotherapy. #3. Followup in 3 months with lab tests. Patient was told to call should any new symptoms occur particularly  dysphagia or weight loss.   All questions were answered. The patient knows to call the clinic with any problems, questions or concerns. We can certainly see the patient much sooner if necessary.   I spent 25 minutes counseling the patient face to face. The total time spent in the appointment was 30 minutes.    Maurilio Lovely, MD 10/28/2013 12:47 PM

## 2013-11-06 ENCOUNTER — Ambulatory Visit (HOSPITAL_COMMUNITY): Payer: Medicare Other

## 2013-11-06 ENCOUNTER — Ambulatory Visit (HOSPITAL_COMMUNITY)
Admission: RE | Admit: 2013-11-06 | Discharge: 2013-11-06 | Disposition: A | Discharge status: Home / Self Care | Payer: Medicare Other | Source: Ambulatory Visit | Attending: Hematology and Oncology | Admitting: Hematology and Oncology

## 2013-11-06 DIAGNOSIS — I10 Essential (primary) hypertension: Secondary | ICD-10-CM | POA: Insufficient documentation

## 2013-11-06 DIAGNOSIS — C159 Malignant neoplasm of esophagus, unspecified: Secondary | ICD-10-CM | POA: Insufficient documentation

## 2013-11-06 DIAGNOSIS — Z923 Personal history of irradiation: Secondary | ICD-10-CM | POA: Insufficient documentation

## 2013-11-06 MED ORDER — IOHEXOL 300 MG/ML  SOLN
100.0000 mL | Freq: Once | INTRAMUSCULAR | Status: AC | PRN
  Administered 2013-11-06: 100 mL via INTRAVENOUS

## 2013-11-11 ENCOUNTER — Ambulatory Visit (INDEPENDENT_AMBULATORY_CARE_PROVIDER_SITE_OTHER): Payer: Medicare Other | Admitting: Family Medicine

## 2013-11-11 ENCOUNTER — Encounter: Payer: Self-pay | Admitting: Family Medicine

## 2013-11-11 VITALS — BP 170/100 | HR 80 | Temp 98.1°F | Resp 18 | Ht 67.5 in | Wt 135.0 lb

## 2013-11-11 DIAGNOSIS — I10 Essential (primary) hypertension: Secondary | ICD-10-CM

## 2013-11-11 DIAGNOSIS — C159 Malignant neoplasm of esophagus, unspecified: Secondary | ICD-10-CM

## 2013-11-11 DIAGNOSIS — Z23 Encounter for immunization: Secondary | ICD-10-CM

## 2013-11-11 MED ORDER — TERAZOSIN HCL 1 MG PO CAPS: 1.0000 mg | ORAL_CAPSULE | Freq: Every day | ORAL | Status: DC

## 2013-11-11 MED ORDER — HYDROCHLOROTHIAZIDE 25 MG PO TABS: 25.0000 mg | ORAL_TABLET | Freq: Every day | ORAL | Status: DC

## 2013-11-11 NOTE — Progress Notes (Signed)
   Subjective:    Patient ID: Eddie [name], male    DOB: [birth year], 67 y.o.   MRN: [id number]  HPI  Patient here to followup chronic medical problems. He was recently evaluated by oncology had a CT of chest and abdomen pelvis which not show any recurrence of his squamous cell carcinoma. He is due to have a colonoscopy in January.  He has been taking his Hytrin without any difficulties but noticed that he has had mild headaches on and off. No medications are needed for the pain. His blood pressure was quite elevated at the oncologist office.    Review of Systems  GEN- denies fatigue, fever, weight loss,weakness, recent illness HEENT- denies eye drainage, change in vision, nasal discharge, CVS- denies chest pain, palpitations RESP- denies SOB, cough, wheeze ABD- denies N/V, change in stools, abd pain GU- denies dysuria, hematuria, dribbling, incontinence MSK- denies joint pain, muscle aches, injury Neuro-+ headache, dizziness, syncope, seizure activity      Objective:   Physical Exam GEN- NAD, alert and oriented x3 HEENT- PERRL, EOMI, non injected sclera, pink conjunctiva, MMM, oropharynx clear, fundoscopic exam benign Neck- Supple, no lAD CVS- RRR, no murmur RESP-CTAB EXT- No edema Pulses- Radial, DP- 2+ Neuro- CNII-XII in tact, no focal deficits        Assessment & Plan:

## 2013-11-11 NOTE — Assessment & Plan Note (Signed)
I reviewed his recent set of labs. His blood pressure severely elevated think this is causing his mild headache. Will start him on hydrochlorothiazide once a day he will continue the Hytrin he will followup in one week for blood pressure recheck

## 2013-11-11 NOTE — Assessment & Plan Note (Signed)
I reviewed CT scans with patient there no signs of recurrence of disease

## 2013-11-11 NOTE — Patient Instructions (Signed)
New blood pressure pill take in the morning Prevnar 13 and flu shot given F/U 1 week for blood pressure

## 2013-11-12 NOTE — Addendum Note (Signed)
Addended by: Elvina Mattes T on: 11/12/2013 08:55 AM   Modules accepted: Orders

## 2013-11-18 ENCOUNTER — Ambulatory Visit (INDEPENDENT_AMBULATORY_CARE_PROVIDER_SITE_OTHER): Payer: Medicare Other | Admitting: Family Medicine

## 2013-11-18 ENCOUNTER — Encounter: Payer: Self-pay | Admitting: Family Medicine

## 2013-11-18 VITALS — BP 160/98 | HR 68 | Temp 98.1°F | Resp 18 | Ht 67.5 in | Wt 132.0 lb

## 2013-11-18 DIAGNOSIS — I10 Essential (primary) hypertension: Secondary | ICD-10-CM

## 2013-11-18 MED ORDER — LISINOPRIL 10 MG PO TABS: 10.0000 mg | ORAL_TABLET | Freq: Every day | ORAL | Status: DC

## 2013-11-18 NOTE — Patient Instructions (Signed)
Take the Lisinopril and HCTZ both in the morning  Continue the other at bedtime F/U 2 weeks- Week of  Dec 29th

## 2013-11-18 NOTE — Assessment & Plan Note (Signed)
Blood pressure is still quite elevated. I will add back lisinopril 10 mg. He required multiple medications in the past. He will take lisinopril with HCTZ in the morning and continue the Hytrin at bedtime he will followup in 2 weeks time.

## 2013-11-18 NOTE — Progress Notes (Signed)
   Subjective:    Patient ID: Eddie [name], male    DOB: 1946/09/04, 67 y.o.   MRN: [id number]  HPI  Patient here to followup hypertension. He was seen one week ago he was started on hydrochlorothiazide 25 mg he was started on Hytrin 1 mg at bedtime. He states that his headaches have resolved he has no complaints today. He is tolerating the medication without any difficulty  Review of Systems  GEN- denies fatigue, fever, weight loss,weakness, recent illness HEENT- denies eye drainage, change in vision, nasal discharge, CVS- denies chest pain, palpitations RESP- denies SOB, cough, wheezey Neuro- denies headache, dizziness, syncope, seizure activity      Objective:   Physical Exam GEN- NAD, alert and oriented x3 BP repeated bilat 160/98 Pulses- Radial,         Assessment & Plan:

## 2013-12-03 ENCOUNTER — Ambulatory Visit (INDEPENDENT_AMBULATORY_CARE_PROVIDER_SITE_OTHER): Payer: Medicare Other | Admitting: Family Medicine

## 2013-12-03 ENCOUNTER — Encounter: Payer: Self-pay | Admitting: Family Medicine

## 2013-12-03 ENCOUNTER — Telehealth (INDEPENDENT_AMBULATORY_CARE_PROVIDER_SITE_OTHER): Payer: Self-pay | Admitting: *Deleted

## 2013-12-03 VITALS — BP 130/86 | HR 64 | Temp 97.7°F | Resp 18 | Ht 67.5 in | Wt 131.0 lb

## 2013-12-03 DIAGNOSIS — I10 Essential (primary) hypertension: Secondary | ICD-10-CM

## 2013-12-03 NOTE — Patient Instructions (Signed)
Continue current medications F/U April 2015

## 2013-12-03 NOTE — Progress Notes (Signed)
   Subjective:    Patient ID: Eddie [name], male    DOB: 13-Jan-1946, 67 y.o.   MRN: [id number]  HPI Patient here for interim visit for his blood pressure. At the last visit he was started on lisinopril in addition to his hydrochlorothiazide and Hytrin. He did state on Saturday he had some mild swelling of his face and around his lip however he does remember eating some new candy the night before this went away after about an hour and he continue to take the blood pressure medication. He denies any pain or shortness of breath associated. He did not do anything particular except she and the swelling resolved.   Review of Systems  GEN- denies fatigue, fever, weight loss,weakness, recent illness HEENT- denies eye drainage, change in vision, nasal discharge, CVS- denies chest pain, palpitations RESP- denies SOB, cough, wheeze Neuro- denies headache, dizziness, syncope, seizure activity      Objective:   Physical Exam GEN- NAD, alert and oriented x3 HEENT- PERRL, EOMI, non injected sclera, pink conjunctiva, MMM, oropharynx clear CVS- RRR, no murmur RESP-CTAB EXT- No edema        Assessment & Plan:

## 2013-12-03 NOTE — Assessment & Plan Note (Signed)
Is difficult to say if the mild swelling was related to the lisinopril. He has been on this medication before. He could of been related to what he is well. If he continues to take the medication has not had any difficulties we will continue with a low dose lisinopril as well as hydrochlorothiazide and we will monitor him. If he has any other swelling he understands that he should stop the medication and call me.  Blood pressure is much improved

## 2013-12-03 NOTE — Telephone Encounter (Signed)
  Procedure: tcs  Reason/Indication:  screening  Has patient had this procedure before?  no  If so, when, by whom and where?    Is there a family history of colon cancer?  no  Who?  What age when diagnosed?    Is patient diabetic?   no      Does patient have prosthetic heart valve?  no  Do you have a pacemaker?  no  Has patient ever had endocarditis? no  Has patient had joint replacement within last 12 months?  no  Does patient tend to be constipated or take laxatives? no  Is patient on Coumadin, Plavix and/or Aspirin? no  Medications: see EPIC  Allergies: see EPIC  Medication Adjustment:   Procedure date & time: 12/26/13 at 830

## 2013-12-04 NOTE — Telephone Encounter (Signed)
agree

## 2013-12-12 ENCOUNTER — Encounter (HOSPITAL_COMMUNITY): Payer: Self-pay | Admitting: Pharmacy Technician

## 2013-12-26 ENCOUNTER — Ambulatory Visit (HOSPITAL_COMMUNITY)
Admission: RE | Admit: 2013-12-26 | Discharge: 2013-12-26 | Disposition: A | Discharge status: Home / Self Care | Payer: Medicare Other | Source: Ambulatory Visit | Attending: Internal Medicine | Admitting: Internal Medicine

## 2013-12-26 ENCOUNTER — Encounter (HOSPITAL_COMMUNITY): Payer: Self-pay | Admitting: *Deleted

## 2013-12-26 ENCOUNTER — Encounter (HOSPITAL_COMMUNITY)
Admission: RE | Disposition: A | Discharge status: Home / Self Care | Payer: Self-pay | Source: Ambulatory Visit | Attending: Internal Medicine

## 2013-12-26 DIAGNOSIS — Z1211 Encounter for screening for malignant neoplasm of colon: Secondary | ICD-10-CM | POA: Insufficient documentation

## 2013-12-26 DIAGNOSIS — D126 Benign neoplasm of colon, unspecified: Secondary | ICD-10-CM

## 2013-12-26 DIAGNOSIS — K644 Residual hemorrhoidal skin tags: Secondary | ICD-10-CM | POA: Insufficient documentation

## 2013-12-26 DIAGNOSIS — I10 Essential (primary) hypertension: Secondary | ICD-10-CM | POA: Insufficient documentation

## 2013-12-26 DIAGNOSIS — Z79899 Other long term (current) drug therapy: Secondary | ICD-10-CM | POA: Insufficient documentation

## 2013-12-26 HISTORY — PX: COLONOSCOPY: SHX5424

## 2013-12-26 SURGERY — COLONOSCOPY
Anesthesia: Moderate Sedation

## 2013-12-26 MED ORDER — STERILE WATER FOR IRRIGATION IR SOLN
Status: DC | PRN
  Administered 2013-12-26: 09:00:00

## 2013-12-26 MED ORDER — MIDAZOLAM HCL 5 MG/5ML IJ SOLN
INTRAMUSCULAR | Status: DC | PRN
  Administered 2013-12-26 (×2): 2 mg via INTRAVENOUS

## 2013-12-26 MED ORDER — SODIUM CHLORIDE 0.9 % IV SOLN
INTRAVENOUS | Status: DC
  Administered 2013-12-26: 08:00:00 via INTRAVENOUS

## 2013-12-26 MED ORDER — MIDAZOLAM HCL 5 MG/5ML IJ SOLN
INTRAMUSCULAR | Status: AC
  Filled 2013-12-26: qty 10

## 2013-12-26 MED ORDER — MEPERIDINE HCL 50 MG/ML IJ SOLN
INTRAMUSCULAR | Status: AC
  Filled 2013-12-26: qty 1

## 2013-12-26 MED ORDER — MEPERIDINE HCL 50 MG/ML IJ SOLN
INTRAMUSCULAR | Status: DC | PRN
  Administered 2013-12-26: 25 mg via INTRAVENOUS

## 2013-12-26 NOTE — Op Note (Signed)
COLONOSCOPY PROCEDURE REPORT  PATIENT:  Eddie [name]  MR#:  [id number] Birthdate:  06/28/1946, 68 y.o., male Endoscopist:  Dr. Rogene Houston, MD Referred By:  Dr. Vic Blackbird, MD  Procedure Date: 12/26/2013  Procedure:   Colonoscopy  Indications:  Patient is 68 year old African male who is undergoing average risk screening colonoscopy.  Informed Consent:  The procedure and risks were reviewed with the patient and informed consent was obtained.  Medications:  Demerol 25 mg IV Versed 4 mg IV  Description of procedure:  After a digital rectal exam was performed, that colonoscope was advanced from the anus through the rectum and colon to the area of the cecum, ileocecal valve and appendiceal orifice. The cecum was deeply intubated. These structures were well-seen and photographed for the record. From the level of the cecum and ileocecal valve, the scope was slowly and cautiously withdrawn. The mucosal surfaces were carefully surveyed utilizing scope tip to flexion to facilitate fold flattening as needed. The scope was pulled down into the rectum where a thorough exam including retroflexion was performed.  Findings:   Prep excellent. 8 mm pedunculated polyp snared from distal sigmoid colon. Normal rectal mucosa. Small hemorrhoids below the dentate line.   Therapeutic/Diagnostic Maneuvers Performed:  See above  Complications:  None  Cecal Withdrawal Time:  10 minutes  Impression:  Examination performed to cecum. 8 mm pedunculated polyp snared from distal sigmoid colon. Small external hemorrhoids.  Recommendations:  Standard instructions given. I will contact patient with biopsy results and further recommendations.  [name]  12/26/2013 9:03 AM  CC: Dr. Vic Blackbird, MD & Dr. Rayne Du ref. provider found

## 2013-12-26 NOTE — Discharge Instructions (Signed)
Resume usual medications and diet. No aspirin for one week. No driving for 24 hours. Physician will contact you with biopsy results.  Colonoscopy, Care After Refer to this sheet in the next few weeks. These instructions provide you with information on caring for yourself after your procedure. Your health care provider may also give you more specific instructions. Your treatment has been planned according to current medical practices, but problems sometimes occur. Call your health care provider if you have any problems or questions after your procedure. WHAT TO EXPECT AFTER THE PROCEDURE  After your procedure, it is typical to have the following:  A small amount of blood in your stool.  Moderate amounts of gas and mild abdominal cramping or bloating. HOME CARE INSTRUCTIONS  Do not drive, operate machinery, or sign important documents for 24 hours.  You may shower and resume your regular physical activities, but move at a slower pace for the first 24 hours.  Take frequent rest periods for the first 24 hours.  Walk around or put a warm pack on your abdomen to help reduce abdominal cramping and bloating.  Drink enough fluids to keep your urine clear or pale yellow.  You may resume your normal diet as instructed by your health care provider. Avoid heavy or fried foods that are hard to digest.  Avoid drinking alcohol for 24 hours or as instructed by your health care provider.  Only take over-the-counter or prescription medicines as directed by your health care provider.  If a tissue sample (biopsy) was taken during your procedure:  Do not take aspirin or blood thinners for 7 days, or as instructed by your health care provider.  Do not drink alcohol for 7 days, or as instructed by your health care provider.  Eat soft foods for the first 24 hours. SEEK MEDICAL CARE IF: You have persistent spotting of blood in your stool 2 3 days after the procedure. SEEK IMMEDIATE MEDICAL CARE  IF:  You have more than a small spotting of blood in your stool.  You pass large blood clots in your stool.  Your abdomen is swollen (distended).  You have nausea or vomiting.  You have a fever.  You have increasing abdominal pain that is not relieved with medicine.  Colon Polyps Polyps are lumps of extra tissue growing inside the body. Polyps can grow in the large intestine (colon). Most colon polyps are noncancerous (benign). However, some colon polyps can become cancerous over time. Polyps that are larger than a pea may be harmful. To be safe, caregivers remove and test all polyps. CAUSES  Polyps form when mutations in the genes cause your cells to grow and divide even though no more tissue is needed. RISK FACTORS There are a number of risk factors that can increase your chances of getting colon polyps. They include:  Being older than 50 years.  Family history of colon polyps or colon cancer.  Long-term colon diseases, such as colitis or Crohn disease.  Being overweight.  Smoking.  Being inactive.  Drinking too much alcohol. SYMPTOMS  Most small polyps do not cause symptoms. If symptoms are present, they may include:  Blood in the stool. The stool may look dark red or black.  Constipation or diarrhea that lasts longer than 1 week. DIAGNOSIS People often do not know they have polyps until their caregiver finds them during a regular checkup. Your caregiver can use 4 tests to check for polyps:  Digital rectal exam. The caregiver wears gloves and feels inside  the rectum. This test would find polyps only in the rectum.  Barium enema. The caregiver puts a liquid called barium into your rectum before taking X-rays of your colon. Barium makes your colon look white. Polyps are dark, so they are easy to see in the X-ray pictures.  Sigmoidoscopy. A thin, flexible tube (sigmoidoscope) is placed into your rectum. The sigmoidoscope has a light and tiny camera in it. The caregiver  uses the sigmoidoscope to look at the last third of your colon.  Colonoscopy. This test is like sigmoidoscopy, but the caregiver looks at the entire colon. This is the most common method for finding and removing polyps. TREATMENT  Any polyps will be removed during a sigmoidoscopy or colonoscopy. The polyps are then tested for cancer. PREVENTION  To help lower your risk of getting more colon polyps:  Eat plenty of fruits and vegetables. Avoid eating fatty foods.  Do not smoke.  Avoid drinking alcohol.  Exercise every day.  Lose weight if recommended by your caregiver.  Eat plenty of calcium and folate. Foods that are rich in calcium include milk, cheese, and broccoli. Foods that are rich in folate include chickpeas, kidney beans, and spinach. HOME CARE INSTRUCTIONS Keep all follow-up appointments as directed by your caregiver. You may need periodic exams to check for polyps. SEEK MEDICAL CARE IF: You notice bleeding during a bowel movement.

## 2013-12-26 NOTE — H&P (Addendum)
Eddie [name] is an 68 y.o. male.   Chief Complaint: Patient is here for colonoscopy. HPI: Patient is 68 year old male who is here for screening colonoscopy. He denies abdominal pain change in his bowel habits or rectal bleeding. This is patient's first exam. Family history is negative for CRC. Personal history significant for history of squamous cell esophageal carcinoma and he remains in remission.  Past Medical History  Diagnosis Date  . Hypertension   . Chronic back pain   . GERD (gastroesophageal reflux disease)   . Squamous cell carcinoma Oct 2014    Esophogus  . Squamous cell carcinoma of esophagus 09/25/2012  . Severe malnutrition 10/26/2012  . Duodenal ulcer 09/2012  . Helicobacter pylori (H. pylori) infection 09/2012    Past Surgical History  Procedure Laterality Date  . Peg tube placement  09/21/2012  . Esophagogastroduodenoscopy  09/21/2012    Procedure: ESOPHAGOGASTRODUODENOSCOPY (EGD);  Surgeon: Rogene Houston, MD;  Location: [facility];  Service: Endoscopy;  Laterality: N/A;  345  . Peg placement  09/21/2012    Procedure: PERCUTANEOUS ENDOSCOPIC GASTROSTOMY (PEG) PLACEMENT;  Surgeon: Rogene Houston, MD;  Location: [facility];  Service: Endoscopy;  Laterality: N/A;  . Portacath placement    . Removal of gastrostomy tube      august 2014    Family History  Problem Relation Age of Onset  . Stroke Mother   . Hypertension Father   . Cancer Brother     Throat cancer  . Kidney disease Daughter    Social History:  reports that he has been smoking Cigarettes.  He has a 50 pack-year smoking history. He has never used smokeless tobacco. He reports that he drinks about 3.6 ounces of alcohol per week. He reports that he does not use illicit drugs.  Allergies:  Allergies  Allergen Reactions  . Motrin [Ibuprofen] Swelling    Medications Prior to Admission  Medication Sig Dispense Refill  . Cyanocobalamin (VITAMIN B-12 PO) Take 1 tablet by mouth daily.      .  hydrochlorothiazide (HYDRODIURIL) 25 MG tablet Take 1 tablet (25 mg total) by mouth daily.  30 tablet  3  . lisinopril (PRINIVIL,ZESTRIL) 10 MG tablet Take 1 tablet (10 mg total) by mouth daily.  30 tablet  3  . terazosin (HYTRIN) 1 MG capsule Take 1 capsule (1 mg total) by mouth at bedtime.  30 capsule  6    No results found for this or any previous visit (from the past 48 hour(s)). No results found.  ROS  Blood pressure 155/100, pulse 89, temperature 98.3 F (36.8 C), temperature source Oral, resp. rate 18, height 5\' 7"  (1.702 m), weight 130 lb (58.968 kg), SpO2 98.00%. Physical Exam  Constitutional: He appears well-developed and well-nourished.  HENT:  Mouth/Throat: Oropharynx is clear and moist.  He has sebaceous cyst between eyebrows  Eyes: Conjunctivae are normal. No scleral icterus.  Neck: No thyromegaly present.  Cardiovascular: Normal rate, regular rhythm and normal heart sounds.   No murmur heard. Respiratory: Effort normal and breath sounds normal.  GI: Soft. He exhibits no distension and no mass. There is no tenderness.  Musculoskeletal: He exhibits no edema.  Lymphadenopathy:    He has no cervical adenopathy.  Neurological: He is alert.  Skin: Skin is warm and dry.     Assessment/Plan Average risk screening colonoscopy.  Eddie [name] 12/26/2013, 8:34 AM

## 2013-12-31 ENCOUNTER — Encounter (HOSPITAL_COMMUNITY): Payer: Self-pay | Admitting: Internal Medicine

## 2014-01-27 ENCOUNTER — Encounter (HOSPITAL_COMMUNITY): Payer: Medicare Other | Attending: Hematology and Oncology

## 2014-01-27 ENCOUNTER — Encounter (HOSPITAL_BASED_OUTPATIENT_CLINIC_OR_DEPARTMENT_OTHER): Payer: Medicare Other

## 2014-01-27 ENCOUNTER — Encounter (HOSPITAL_COMMUNITY): Payer: Self-pay

## 2014-01-27 VITALS — BP 141/80 | HR 73 | Temp 97.8°F | Resp 20 | Wt 132.3 lb

## 2014-01-27 DIAGNOSIS — C154 Malignant neoplasm of middle third of esophagus: Secondary | ICD-10-CM

## 2014-01-27 DIAGNOSIS — D126 Benign neoplasm of colon, unspecified: Secondary | ICD-10-CM

## 2014-01-27 DIAGNOSIS — I10 Essential (primary) hypertension: Secondary | ICD-10-CM

## 2014-01-27 DIAGNOSIS — F17200 Nicotine dependence, unspecified, uncomplicated: Secondary | ICD-10-CM

## 2014-01-27 DIAGNOSIS — J449 Chronic obstructive pulmonary disease, unspecified: Secondary | ICD-10-CM

## 2014-01-27 LAB — CBC WITH DIFFERENTIAL/PLATELET
BASOS ABS: 0.1 10*3/uL (ref 0.0–0.1)
BASOS PCT: 1 % (ref 0–1)
EOS ABS: 0.2 10*3/uL (ref 0.0–0.7)
EOS PCT: 3 % (ref 0–5)
HEMATOCRIT: 47.2 % (ref 39.0–52.0)
Hemoglobin: 15.8 g/dL (ref 13.0–17.0)
Lymphocytes Relative: 22 % (ref 12–46)
Lymphs Abs: 2 10*3/uL (ref 0.7–4.0)
MCH: 33.1 pg (ref 26.0–34.0)
MCHC: 33.5 g/dL (ref 30.0–36.0)
MCV: 99 fL (ref 78.0–100.0)
MONO ABS: 0.6 10*3/uL (ref 0.1–1.0)
Monocytes Relative: 7 % (ref 3–12)
Neutro Abs: 6 10*3/uL (ref 1.7–7.7)
Neutrophils Relative %: 67 % (ref 43–77)
PLATELETS: 219 10*3/uL (ref 150–400)
RBC: 4.77 MIL/uL (ref 4.22–5.81)
RDW: 12.1 % (ref 11.5–15.5)
WBC: 8.9 10*3/uL (ref 4.0–10.5)

## 2014-01-27 LAB — COMPREHENSIVE METABOLIC PANEL
ALT: 8 U/L (ref 0–53)
AST: 15 U/L (ref 0–37)
Albumin: 4 g/dL (ref 3.5–5.2)
Alkaline Phosphatase: 74 U/L (ref 39–117)
BUN: 20 mg/dL (ref 6–23)
CALCIUM: 9.3 mg/dL (ref 8.4–10.5)
CO2: 23 mEq/L (ref 19–32)
Chloride: 105 mEq/L (ref 96–112)
Creatinine, Ser: 1.34 mg/dL (ref 0.50–1.35)
GFR calc Af Amer: 63 mL/min — ABNORMAL LOW (ref >90)
GFR, EST NON AFRICAN AMERICAN: 54 mL/min — AB (ref >90)
Glucose, Bld: 97 mg/dL (ref 70–99)
Potassium: 4.3 mEq/L (ref 3.7–5.3)
SODIUM: 139 meq/L (ref 137–147)
TOTAL PROTEIN: 7.3 g/dL (ref 6.0–8.3)
Total Bilirubin: 0.9 mg/dL (ref 0.3–1.2)

## 2014-01-27 LAB — LACTATE DEHYDROGENASE: LDH: 125 U/L (ref 94–250)

## 2014-01-27 NOTE — Patient Instructions (Signed)
[facility] Discharge Instructions  RECOMMENDATIONS MADE BY THE CONSULTANT AND ANY TEST RESULTS WILL BE SENT TO YOUR REFERRING PHYSICIAN.  EXAM FINDINGS BY THE PHYSICIAN TODAY AND SIGNS OR SYMPTOMS TO REPORT TO CLINIC OR PRIMARY PHYSICIAN: Exam and findings as discussed by Dr. Barnet Glasgow.  MEDICATIONS PRESCRIBED:   None.  INSTRUCTIONS/FOLLOW-UP:  Return in 2 months for lab work and doctor appointment.  Please call Dr. Laural Golden should you develop difficulty swallowing or coughing after eating.    Thank you for choosing [facility] to provide your oncology and hematology care.  To afford each patient quality time with our providers, please arrive at least 15 minutes before your scheduled appointment time.  With your help, our goal is to use those 15 minutes to complete the necessary work-up to ensure our physicians have the information they need to help with your evaluation and healthcare recommendations.    Effective January 1st, 2014, we ask that you re-schedule your appointment with our physicians should you arrive 10 or more minutes late for your appointment.  We strive to give you quality time with our providers, and arriving late affects you and other patients whose appointments are after yours.    Again, thank you for choosing [facility].  Our hope is that these requests will decrease the amount of time that you wait before being seen by our physicians.       _____________________________________________________________  Should you have questions after your visit to [facility], please contact our office at [phone] between the hours of 8:30 a.m. and 5:00 p.m.  Voicemails left after 4:30 p.m. will not be returned until the following business day.  For prescription refill requests, have your pharmacy contact our office with your prescription refill request.

## 2014-01-27 NOTE — Progress Notes (Signed)
[place]  OFFICE PROGRESS NOTE  Vic Blackbird, MD [address] [place] [address]  DIAGNOSIS: Cancer of middle third of esophagus - Plan: CBC with Differential, Comprehensive metabolic panel, Lactate dehydrogenase, CBC with Differential, Comprehensive metabolic panel, Lactate dehydrogenase  Adenoma of large intestine  Chief Complaint  Patient presents with  . Follow-up  . 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  INTERVAL HISTORY: Eddie [name] 68 y.o. 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  He continues to smoke one pack of cigarettes daily. He denies any postprandial coughing, dysphagia, nausea, vomiting, diarrhea, constipation, peripheral paresthesias, lower extremity swelling or redness, melena, hematochezia, hematuria, hemoptysis, chest pain, headache, or seizures. He underwent colonoscopy on 12/26/2013.  MEDICAL HISTORY: Past Medical History  Diagnosis Date  . Hypertension   . Chronic back pain   . GERD (gastroesophageal reflux disease)   . Squamous cell carcinoma Oct 2014    Esophogus  . Squamous cell carcinoma of esophagus 09/25/2012  . Severe malnutrition 10/26/2012  . Duodenal ulcer 09/2012  . Helicobacter pylori (H. pylori) infection 09/2012    INTERIM HISTORY: has Essential hypertension, benign; Emphysema; Tobacco use; Squamous cell carcinoma of esophagus; Edentulous; Duodenal ulcer; Seasonal allergies; and Adenoma of large intestine on his problem list.   Unresectable squamous cell carcinoma the upper esophagus with positive nodes high in the paratracheal region not felt to be a surgical candidate by Dr. Servando Snare of  thoracic surgery. He is status post 5-FU by constant infusion (10/11/2012- 12/10/2012) in conjunction with radiation therapy by Dr. Orlene Erm. He had difficulties during treatment secondary to toxicities, but he did complete the course of chemoradiation. We did have to dose reduce him at one time (11/12/2012). His most recent CT scan in Dec 2014 was negative for recurrence or residual disease.  ALLERGIES:  is allergic to motrin.  MEDICATIONS: has a current medication list which includes the following prescription(s): cyanocobalamin, hydrochlorothiazide, lisinopril, and terazosin.  SURGICAL HISTORY:  Past Surgical History  Procedure Laterality Date  . Peg tube placement  09/21/2012  . Esophagogastroduodenoscopy  09/21/2012    Procedure: ESOPHAGOGASTRODUODENOSCOPY (EGD);  Surgeon: Rogene Houston, MD;  [facility];  Service: Endoscopy;  Laterality: N/A;  345  . Peg placement  09/21/2012    Procedure: PERCUTANEOUS ENDOSCOPIC GASTROSTOMY (PEG) PLACEMENT;  Surgeon: Rogene Houston, MD;  [facility];  Service: Endoscopy;  Laterality: N/A;  . Portacath placement    . Removal of gastrostomy tube      august 2014  . Colonoscopy N/A 12/26/2013    Procedure: COLONOSCOPY;  Surgeon: Rogene Houston, MD;  [facility];  Service: Endoscopy;  Laterality: N/A;  30    FAMILY HISTORY: family history includes Cancer in his brother; Hypertension in his father; Kidney disease in his daughter; Stroke in his mother.  SOCIAL HISTORY:  reports that he has been smoking Cigarettes.  He has a 50 pack-year smoking history. He has never used smokeless tobacco. He reports that he drinks about 3.6 ounces of alcohol per week. He reports that he does not use illicit drugs.  REVIEW OF SYSTEMS:  Other than that discussed above is noncontributory.  PHYSICAL EXAMINATION:  ECOG PERFORMANCE STATUS: 0 - Asymptomatic  Blood pressure 141/80, pulse 73, temperature 97.8 F (36.6 C), temperature source  Oral, resp. rate 20, weight 132 lb 4.8 oz (60.011 kg).  GENERAL:alert, no distress and comfortable SKIN: skin color, texture, turgor are normal, no rashes or significant lesions EYES: PERLA; Conjunctiva are pink and non-injected, sclera clear OROPHARYNX:no exudate, no erythema on lips, buccal mucosa, or tongue. NECK: supple, thyroid normal size, non-tender, without nodularity. No masses CHEST: Increased AP diameter with no gynecomastia. LYMPH:  no palpable lymphadenopathy in the cervical, axillary or inguinal LUNGS: clear to auscultation and percussion with normal breathing effort HEART: regular rate & rhythm and no murmurs. ABDOMEN:abdomen soft, non-tender and normal bowel sounds MUSCULOSKELETAL:no cyanosis of digits and no clubbing. Range of motion normal.  NEURO: alert & oriented x 3 with fluent speech, no focal motor/sensory deficits   LABORATORY DATA: Infusion on 01/27/2014  Component Date Value Ref Range Status  . WBC 01/27/2014 8.9  4.0 - 10.5 K/uL Final  . RBC 01/27/2014 4.77  4.22 - 5.81 MIL/uL Final  . Hemoglobin 01/27/2014 15.8  13.0 - 17.0 g/dL Final  . HCT 01/27/2014 47.2  39.0 - 52.0 % Final  . MCV 01/27/2014 99.0  78.0 - 100.0 fL Final  . MCH 01/27/2014 33.1  26.0 - 34.0 pg Final  . MCHC 01/27/2014 33.5  30.0 - 36.0 g/dL Final  . RDW 01/27/2014 12.1  11.5 - 15.5 % Final  . Platelets 01/27/2014 219  150 - 400 K/uL Final  . Neutrophils Relative % 01/27/2014 67  43 - 77 % Final  . Neutro Abs 01/27/2014 6.0  1.7 - 7.7 K/uL Final  . Lymphocytes Relative 01/27/2014 22  12 - 46 % Final  . Lymphs Abs 01/27/2014 2.0  0.7 - 4.0 K/uL Final  . Monocytes Relative 01/27/2014 7  3 - 12 % Final  . Monocytes Absolute 01/27/2014 0.6  0.1 - 1.0 K/uL Final  . Eosinophils Relative 01/27/2014 3  0 - 5 % Final  . Eosinophils Absolute 01/27/2014 0.2  0.0 - 0.7 K/uL Final  . Basophils Relative 01/27/2014 1  0 - 1 % Final  . Basophils Absolute 01/27/2014 0.1  0.0 - 0.1 K/uL Final  . Sodium  01/27/2014 139  137 - 147 mEq/L Final  . Potassium 01/27/2014 4.3  3.7 - 5.3 mEq/L Final  . Chloride 01/27/2014 105  96 - 112 mEq/L Final  . CO2 01/27/2014 23  19 - 32 mEq/L Final  . Glucose, Bld 01/27/2014 97  70 - 99 mg/dL Final  . BUN 01/27/2014 20  6 - 23 mg/dL Final  . Creatinine, Ser 01/27/2014 1.34  0.50 - 1.35 mg/dL Final  . Calcium 01/27/2014 9.3  8.4 - 10.5 mg/dL Final  . Total Protein 01/27/2014 7.3  6.0 - 8.3 g/dL Final  . Albumin 01/27/2014 4.0  3.5 - 5.2 g/dL Final  . AST 01/27/2014 15  0 - 37 U/L Final  . ALT 01/27/2014 8  0 - 53 U/L Final  . Alkaline Phosphatase 01/27/2014 74  39 - 117 U/L Final  . Total Bilirubin 01/27/2014 0.9  0.3 - 1.2 mg/dL Final  . GFR calc non Af Amer 01/27/2014 54* >90 mL/min Final  . GFR calc Af Amer 01/27/2014 63* >90 mL/min Final   Comment: (NOTE)                          The eGFR has been calculated using the CKD  EPI equation.                          This calculation has not been validated in all clinical situations.                          eGFR's persistently <90 mL/min signify possible Chronic Kidney                          Disease.  Marland Kitchen LDH 01/27/2014 125  94 - 250 U/L Final    PATHOLOGY:  for TYMOTHY, CASS ([id number]) Patient: DONYAE, KILNER Collected: 12/26/2013 Client: [facility] Accession: [id number] Received: 12/26/2013 Hildred Laser DOB: 15-Nov-1946 Age: 39 Gender: M Reported: 12/27/2013 [address] Patient Ph: [phone] MRN #: [id number] Linna Hoff [place] [id number] Visit #: [id number] Chart #: Phone: [phone] Fax: CC: REPORT OF SURGICAL PATHOLOGY FINAL DIAGNOSIS Diagnosis Colon, polyp(s), distal sigmoid - MULTIPLE FRAGMENTS OF TUBULAR ADENOMA. - NO EVIDENCE OF HIGH GRADE DYSPLASIA OR MALIGNANCY. Gretel Acre LI MD Pathologist, Electronic Signature (Case signed 12/27/2013) Specimen Gross and Clinical Information Specimen(s) Obtained: Colon, polyp(s), distal sigmoid Specimen Clinical Information Pre-op:  screening; Post-op: distal sigmoid colon polyp, hemorrhoids Gross Received in formalin is a 0.8 x 0.7 x 0.3 cm soft tan-red mucosal polyp, which is inked, sectioned and entirely submitted in one cassette. (GRP:ecj 12/26/2013) Report signed out from the following location(s) Technical Component performed at [place], [place] [address] CLIA: [id number]., Interpretation performed at [place], [place], [place] [address]. CLIA #: [id number],    Urinalysis    Component Value Date/Time   COLORURINE YELLOW 10/26/2012 [place] 10/26/2012 1539   LABSPEC >1.030* 10/26/2012 1539   PHURINE 5.5 10/26/2012 1539   GLUCOSEU NEGATIVE 10/26/2012 1539   HGBUR TRACE* 10/26/2012 1539   BILIRUBINUR NEGATIVE 10/26/2012 1539   KETONESUR NEGATIVE 10/26/2012 1539   PROTEINUR NEGATIVE 10/26/2012 1539   UROBILINOGEN 0.2 10/26/2012 1539   NITRITE NEGATIVE 10/26/2012 1539   LEUKOCYTESUR NEGATIVE 10/26/2012 1539    RADIOGRAPHIC STUDIES: No results found.  ASSESSMENT:  #1. 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  #2. Chronic obstructive pulmonary disease, continues to smoke.  #3. History of duodenal ulcer and Helicobacter pylori infection, resolved.  #4. Hypertension, controlled. #5. Status post colonoscopy with tubular adenoma resected.    PLAN:  #1. Continue surveillance. #2. Call if develops dysphagia, cough after eating, or odynophagia. Told to call Dr. Laural Golden on as well for upper endoscopy should these symptoms occur. #3. Decrease smoking by one cigarette each day until down to 3 or 4 per day. #4. Followup in 2 months with CBC, chem profile, and LDH. Plan to repeat CT scan in June of 2015.   All questions were answered. The patient knows to call the clinic  with any problems, questions or concerns. We can certainly see the patient much sooner if necessary.   I spent 25 minutes counseling the patient face to face. The total time spent in the appointment was 30 minutes.    Doroteo Bradford, MD 01/27/2014 11:24 AM

## 2014-01-27 NOTE — Progress Notes (Signed)
Labs drawn today for cbc/diff,cmp,ldh 

## 2014-03-27 ENCOUNTER — Encounter (HOSPITAL_COMMUNITY): Payer: Medicare Other | Attending: Hematology and Oncology

## 2014-03-27 ENCOUNTER — Encounter (HOSPITAL_COMMUNITY): Payer: Self-pay

## 2014-03-27 ENCOUNTER — Encounter (HOSPITAL_BASED_OUTPATIENT_CLINIC_OR_DEPARTMENT_OTHER): Payer: Medicare Other

## 2014-03-27 VITALS — BP 158/93 | HR 74 | Temp 97.6°F | Resp 18 | Wt 127.4 lb

## 2014-03-27 DIAGNOSIS — C154 Malignant neoplasm of middle third of esophagus: Secondary | ICD-10-CM

## 2014-03-27 DIAGNOSIS — F17200 Nicotine dependence, unspecified, uncomplicated: Secondary | ICD-10-CM

## 2014-03-27 LAB — COMPREHENSIVE METABOLIC PANEL
ALK PHOS: 69 U/L (ref 39–117)
ALT: 13 U/L (ref 0–53)
AST: 20 U/L (ref 0–37)
Albumin: 4.3 g/dL (ref 3.5–5.2)
BUN: 22 mg/dL (ref 6–23)
CO2: 21 mEq/L (ref 19–32)
Calcium: 9.5 mg/dL (ref 8.4–10.5)
Chloride: 103 mEq/L (ref 96–112)
Creatinine, Ser: 1.52 mg/dL — ABNORMAL HIGH (ref 0.50–1.35)
GFR, EST AFRICAN AMERICAN: 53 mL/min — AB (ref >90)
GFR, EST NON AFRICAN AMERICAN: 46 mL/min — AB (ref >90)
GLUCOSE: 107 mg/dL — AB (ref 70–99)
POTASSIUM: 4.6 meq/L (ref 3.7–5.3)
SODIUM: 137 meq/L (ref 137–147)
Total Bilirubin: 1.1 mg/dL (ref 0.3–1.2)
Total Protein: 7.3 g/dL (ref 6.0–8.3)

## 2014-03-27 LAB — CBC WITH DIFFERENTIAL/PLATELET
Basophils Absolute: 0.1 10*3/uL (ref 0.0–0.1)
Basophils Relative: 1 % (ref 0–1)
EOS PCT: 3 % (ref 0–5)
Eosinophils Absolute: 0.3 10*3/uL (ref 0.0–0.7)
HCT: 43.1 % (ref 39.0–52.0)
Hemoglobin: 15 g/dL (ref 13.0–17.0)
LYMPHS ABS: 2.1 10*3/uL (ref 0.7–4.0)
LYMPHS PCT: 25 % (ref 12–46)
MCH: 33.3 pg (ref 26.0–34.0)
MCHC: 34.8 g/dL (ref 30.0–36.0)
MCV: 95.6 fL (ref 78.0–100.0)
Monocytes Absolute: 0.7 10*3/uL (ref 0.1–1.0)
Monocytes Relative: 8 % (ref 3–12)
NEUTROS PCT: 63 % (ref 43–77)
Neutro Abs: 5.4 10*3/uL (ref 1.7–7.7)
Platelets: 209 10*3/uL (ref 150–400)
RBC: 4.51 MIL/uL (ref 4.22–5.81)
RDW: 11.8 % (ref 11.5–15.5)
WBC: 8.5 10*3/uL (ref 4.0–10.5)

## 2014-03-27 LAB — LACTATE DEHYDROGENASE: LDH: 130 U/L (ref 94–250)

## 2014-03-27 NOTE — Progress Notes (Signed)
[place]  OFFICE PROGRESS NOTE  Vic Blackbird, MD [address] [place] [address]  DIAGNOSIS: Cancer of middle third of esophagus - Plan: CT Abdomen Pelvis W Contrast, CT Chest W Contrast, CBC with Differential, Comprehensive metabolic panel, Lactate dehydrogenase  Chief Complaint  Patient presents with  . 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  INTERVAL HISTORY: Eddie [name] 68 y.o. male returns for followup of squamous cell carcinoma the upper and mid esophagus treated with combined modality therapy ending in January 2014. Last CT scan was done in December of 2014 showed no evidence of disease.  Unfortunately continues to smoke a half-pack of cigarettes daily. He denies any dysphagia, odynophagia, cough, wheezing, but has had nasal drip without sore throat, earache, or expectoration. He denies any hematemesis, melena, hematochezia, hematuria, diarrhea, constipation, lower extremity swelling or redness, chest pain, PND, orthopnea, palpitations, headache, skin rash, or seizures.  MEDICAL HISTORY: Past Medical History  Diagnosis Date  . Hypertension   . Chronic back pain   . GERD (gastroesophageal reflux disease)   . Squamous cell carcinoma Oct 2014    Esophogus  . Squamous cell carcinoma of esophagus 09/25/2012  . Severe malnutrition 10/26/2012  . Duodenal ulcer 09/2012  . Helicobacter pylori (H. pylori) infection 09/2012    INTERIM HISTORY: has Essential hypertension, benign; Emphysema; Tobacco use; Squamous cell carcinoma of esophagus; Edentulous; Duodenal ulcer; Seasonal allergies; and Adenoma of large intestine on his problem list.   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  MEDICATIONS: has a current medication list which includes the following prescription(s): cyanocobalamin, hydrochlorothiazide, lisinopril, and terazosin.  SURGICAL HISTORY:  Past Surgical History  Procedure Laterality Date  . Peg tube placement  09/21/2012  . Esophagogastroduodenoscopy  09/21/2012    Procedure: ESOPHAGOGASTRODUODENOSCOPY (EGD);  Surgeon: Rogene Houston, MD;  [facility];  Service: Endoscopy;  Laterality: N/A;  345  . Peg placement  09/21/2012    Procedure: PERCUTANEOUS ENDOSCOPIC GASTROSTOMY (PEG) PLACEMENT;  Surgeon: Rogene Houston, MD;  [facility];  Service: Endoscopy;  Laterality: N/A;  . Portacath placement    . Removal of gastrostomy tube      august 2014  . Colonoscopy N/A 12/26/2013    Procedure: COLONOSCOPY;  Surgeon: Rogene Houston, MD;  [facility];  Service: Endoscopy;  Laterality: N/A;  79    FAMILY HISTORY: family history includes Cancer in his brother; Hypertension in his father; Kidney disease in his daughter; Stroke in his mother.  SOCIAL HISTORY:  reports that he has been smoking Cigarettes.  He has a 50 pack-year smoking history. He has never used smokeless tobacco. He reports that he drinks about 3.6 ounces of alcohol per week. He reports that he does not use illicit drugs.  REVIEW OF SYSTEMS:  Other than that discussed above is noncontributory.  PHYSICAL EXAMINATION: ECOG PERFORMANCE STATUS: 0 - Asymptomatic  Blood pressure 158/93, pulse 74, temperature 97.6 F (36.4 C), temperature source Oral, resp. rate 18, weight 127 lb 6.4 oz (57.788 kg), SpO2 99.00%.  GENERAL:alert, no distress and comfortable SKIN: skin color, texture, turgor are normal, no  rashes or significant lesions EYES: PERLA; Conjunctiva are pink and non-injected, sclera clear SINUSES: No redness or tenderness over maxillary or ethmoid sinuses OROPHARYNX:no  exudate, no erythema on lips, buccal mucosa, or tongue. NECK: supple, thyroid normal size, non-tender, without nodularity. No masses CHEST: Increased AP diameter with no gynecomastia. LYMPH:  no palpable lymphadenopathy in the cervical, axillary or inguinal LUNGS: clear to auscultation and percussion with normal breathing effort HEART: regular rate & rhythm and no murmurs. ABDOMEN:abdomen soft, non-tender and normal bowel sounds MUSCULOSKELETAL:no cyanosis of digits and no clubbing. Range of motion normal.  NEURO: alert & oriented x 3 with fluent speech, no focal motor/sensory deficits   LABORATORY DATA: Infusion on 03/27/2014  Component Date Value Ref Range Status  . WBC 03/27/2014 8.5  4.0 - 10.5 K/uL Final  . RBC 03/27/2014 4.51  4.22 - 5.81 MIL/uL Final  . Hemoglobin 03/27/2014 15.0  13.0 - 17.0 g/dL Final  . HCT 03/27/2014 43.1  39.0 - 52.0 % Final  . MCV 03/27/2014 95.6  78.0 - 100.0 fL Final  . MCH 03/27/2014 33.3  26.0 - 34.0 pg Final  . MCHC 03/27/2014 34.8  30.0 - 36.0 g/dL Final  . RDW 03/27/2014 11.8  11.5 - 15.5 % Final  . Platelets 03/27/2014 209  150 - 400 K/uL Final  . Neutrophils Relative % 03/27/2014 63  43 - 77 % Final  . Neutro Abs 03/27/2014 5.4  1.7 - 7.7 K/uL Final  . Lymphocytes Relative 03/27/2014 25  12 - 46 % Final  . Lymphs Abs 03/27/2014 2.1  0.7 - 4.0 K/uL Final  . Monocytes Relative 03/27/2014 8  3 - 12 % Final  . Monocytes Absolute 03/27/2014 0.7  0.1 - 1.0 K/uL Final  . Eosinophils Relative 03/27/2014 3  0 - 5 % Final  . Eosinophils Absolute 03/27/2014 0.3  0.0 - 0.7 K/uL Final  . Basophils Relative 03/27/2014 1  0 - 1 % Final  . Basophils Absolute 03/27/2014 0.1  0.0 - 0.1 K/uL Final    PATHOLOGY: No new pathology.  Urinalysis    Component Value Date/Time   COLORURINE YELLOW 10/26/2012 1539   APPEARANCEUR CLEAR 10/26/2012 1539   LABSPEC >1.030* 10/26/2012 1539   PHURINE 5.5 10/26/2012 1539   GLUCOSEU NEGATIVE 10/26/2012 1539   HGBUR  TRACE* 10/26/2012 1539   BILIRUBINUR NEGATIVE 10/26/2012 1539   KETONESUR NEGATIVE 10/26/2012 1539   PROTEINUR NEGATIVE 10/26/2012 1539   UROBILINOGEN 0.2 10/26/2012 1539   NITRITE NEGATIVE 10/26/2012 1539   LEUKOCYTESUR NEGATIVE 10/26/2012 1539    RADIOGRAPHIC STUDIES: CT Abdomen Pelvis W Contrast Status: Final result         PACS Images    Show images for CT Abdomen Pelvis W Contrast         Study Result    CLINICAL DATA: Squamous cell carcinoma of the esophagus post  radiation therapy, history hypertension, smoking  EXAM:  CT CHEST, ABDOMEN, AND PELVIS WITH CONTRAST  TECHNIQUE:  Multidetector CT imaging of the chest, abdomen and pelvis was  performed following the standard protocol during bolus  administration of intravenous contrast. Sagittal and coronal MPR  images reconstructed from axial data set.  CONTRAST: 181mL OMNIPAQUE IOHEXOL 300 MG/ML SOLN. Dilute oral  contrast.  COMPARISON: 10/28/2012  Correlation: PET-CT 01/24/2013  FINDINGS:  CT CHEST FINDINGS  Scattered atherosclerotic calcification aorta and coronary arteries.  No thoracic adenopathy.  Esophagus contains air with minimal residual thickening at the mid  esophagus at the side of tumor identified on  earlier study of  09/13/2012, markedly improved.  No discrete esophageal mass identified.  No acute osseous findings.  Lungs clear.  No pleural effusion, pneumothorax, or pulmonary mass/nodule.  CT ABDOMEN AND PELVIS FINDINGS  Slightly prominent extrarenal pelvis left kidney.  Liver, spleen, pancreas, kidneys, and adrenal glands normal  appearance.  Appendix not definitely visualized.  Stomach and bowel loops normal appearance.  No mass, adenopathy, free fluid, or inflammatory process.  No hernia or acute bone lesions.  Scattered degenerative disc disease changes lumbar spine.  IMPRESSION:  CT CHEST:  No evidence of intra thoracic tumor recurrence or metastatic  disease.  Minimal residual  esophageal wall thickening at site where tumor was  previously located at the mid esophagus.  CT ABDOMEN AND PELVIS:  No evidence of intra-abdominal or intrapelvic metastatic disease.  Electronically Signed  By: Lavonia Dana M.D.  On: 11/06/2013      ASSESSMENT:  #1.#1. 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  #2. Chronic obstructive pulmonary disease, continues to smoke.  #3. History of duodenal ulcer and Helicobacter pylori infection, resolved.  #4. Hypertension, controlled.  #5. Status post colonoscopy with tubular adenoma resected    PLAN:  #1. Repeat CT scans of the chest abdomen and pelvis with contrast on 05/22/2014. #2. Decrease smoking by one cigarette daily until down to 3 or 4 per day. #3. Followup in 2 months with CBC and chem profile. Patient was encouraged to call should any new symptoms occur that are troublesome and persistent, particularly dysphagia.   All questions were answered. The patient knows to call the clinic with any problems, questions or concerns. We can certainly see the patient much sooner if necessary.   I spent 25 minutes counseling the patient face to face. The total time spent in the appointment was 30 minutes.    Farrel Gobble, MD 03/27/2014 9:22 AM  DISCLAIMER:  This note was dictated with voice recognition software.  Similar sounding words can inadvertently be transcribed inaccurately and may not be corrected upon review.

## 2014-03-27 NOTE — Patient Instructions (Signed)
[facility] Discharge Instructions  RECOMMENDATIONS MADE BY THE CONSULTANT AND ANY TEST RESULTS WILL BE SENT TO YOUR REFERRING PHYSICIAN.  We will see you in 2 months for a doctor visit and repeat lab work. You will have CT scans performed before your next visit.  I will give you your contrast and instructions.  Thank you for choosing [facility] to provide your oncology and hematology care.  To afford each patient quality time with our providers, please arrive at least 15 minutes before your scheduled appointment time.  With your help, our goal is to use those 15 minutes to complete the necessary work-up to ensure our physicians have the information they need to help with your evaluation and healthcare recommendations.    Effective January 1st, 2014, we ask that you re-schedule your appointment with our physicians should you arrive 10 or more minutes late for your appointment.  We strive to give you quality time with our providers, and arriving late affects you and other patients whose appointments are after yours.    Again, thank you for choosing [facility].  Our hope is that these requests will decrease the amount of time that you wait before being seen by our physicians.       _____________________________________________________________  Should you have questions after your visit to [facility], please contact our office at [phone] between the hours of 8:30 a.m. and 5:00 p.m.  Voicemails left after 4:30 p.m. will not be returned until the following business day.  For prescription refill requests, have your pharmacy contact our office with your prescription refill request.

## 2014-03-27 NOTE — Progress Notes (Signed)
Labs drawn today for cbc/diff,cmp,ldh 

## 2014-04-02 ENCOUNTER — Ambulatory Visit (INDEPENDENT_AMBULATORY_CARE_PROVIDER_SITE_OTHER): Payer: Medicare Other | Admitting: Family Medicine

## 2014-04-02 ENCOUNTER — Encounter: Payer: Self-pay | Admitting: Family Medicine

## 2014-04-02 VITALS — BP 146/82 | HR 76 | Temp 98.0°F | Resp 16 | Ht 67.0 in | Wt 130.0 lb

## 2014-04-02 DIAGNOSIS — Z720 Tobacco use: Secondary | ICD-10-CM

## 2014-04-02 DIAGNOSIS — J438 Other emphysema: Secondary | ICD-10-CM

## 2014-04-02 DIAGNOSIS — R7301 Impaired fasting glucose: Secondary | ICD-10-CM

## 2014-04-02 DIAGNOSIS — J309 Allergic rhinitis, unspecified: Secondary | ICD-10-CM

## 2014-04-02 DIAGNOSIS — N289 Disorder of kidney and ureter, unspecified: Secondary | ICD-10-CM

## 2014-04-02 DIAGNOSIS — I10 Essential (primary) hypertension: Secondary | ICD-10-CM

## 2014-04-02 DIAGNOSIS — J439 Emphysema, unspecified: Secondary | ICD-10-CM

## 2014-04-02 DIAGNOSIS — F17200 Nicotine dependence, unspecified, uncomplicated: Secondary | ICD-10-CM

## 2014-04-02 DIAGNOSIS — J302 Other seasonal allergic rhinitis: Secondary | ICD-10-CM

## 2014-04-02 LAB — BASIC METABOLIC PANEL
BUN: 18 mg/dL (ref 6–23)
CO2: 22 mEq/L (ref 19–32)
CREATININE: 1.43 mg/dL — AB (ref 0.50–1.35)
Calcium: 9.3 mg/dL (ref 8.4–10.5)
Chloride: 105 mEq/L (ref 96–112)
Glucose, Bld: 82 mg/dL (ref 70–99)
Potassium: 4.7 mEq/L (ref 3.5–5.3)
Sodium: 136 mEq/L (ref 135–145)

## 2014-04-02 LAB — LIPID PANEL
CHOLESTEROL: 171 mg/dL (ref 0–200)
HDL: 67 mg/dL (ref >39)
LDL Cholesterol: 83 mg/dL (ref 0–99)
TRIGLYCERIDES: 103 mg/dL (ref <150)
Total CHOL/HDL Ratio: 2.6 Ratio
VLDL: 21 mg/dL (ref 0–40)

## 2014-04-02 LAB — HEMOGLOBIN A1C
Hgb A1c MFr Bld: 4.7 % (ref <5.7)
Mean Plasma Glucose: 88 mg/dL (ref <117)

## 2014-04-02 MED ORDER — LORATADINE 10 MG PO TABS: 10.0000 mg | ORAL_TABLET | Freq: Every day | ORAL | Status: DC

## 2014-04-02 MED ORDER — LISINOPRIL 10 MG PO TABS: 10.0000 mg | ORAL_TABLET | Freq: Every day | ORAL | Status: DC

## 2014-04-02 MED ORDER — HYDROCHLOROTHIAZIDE 25 MG PO TABS: 25.0000 mg | ORAL_TABLET | Freq: Every day | ORAL | Status: DC

## 2014-04-02 MED ORDER — TERAZOSIN HCL 1 MG PO CAPS: 1.0000 mg | ORAL_CAPSULE | Freq: Every day | ORAL | Status: DC

## 2014-04-02 NOTE — Progress Notes (Signed)
Patient ID: Eddie [name], male   DOB: August 31, 1946, 68 y.o.   MRN: [id number]   Subjective:    Patient ID: Eddie [name], male    DOB: 04/24/1946, 68 y.o.   MRN: [id number]  Patient presents for F/U  patient here follow chronic medical problems. He's been out of his blood pressure medicine for the past week. He was seen by oncology and had a good report he is due to have repeat scans in June. He is concerned about allergies he is sneezing watery eyes runny nose when he goes outside. He does not have any cough or wheezing. He is trying to cut back on his smoking as well as not had any shortness of breath or chest pain. On review of his last oncology he did have renal insufficiency with creatinine 1.5 his blood glucose was also a little elevated.    Review Of Systems:  GEN- denies fatigue, fever, weight loss,weakness, recent illness HEENT- denies eye drainage, change in vision,+ nasal discharge, CVS- denies chest pain, palpitations RESP- denies SOB, cough, wheeze ABD- denies N/V, change in stools, abd pain Neuro- denies headache, dizziness, syncope, seizure activity       Objective:    BP 146/82  Pulse 76  Temp(Src) 98 F (36.7 C) (Oral)  Resp 16  Ht 5\' 7"  (1.702 m)  Wt 130 lb (58.968 kg)  BMI 20.36 kg/m2 GEN- NAD, alert and oriented x3 HEENT- PERRL, EOMI, non injected sclera, pink conjunctiva, MMM, oropharynx clear, TM clear bilat, nares clear drainage, no maxillary sinus tenderness Neck- Supple, no LAD CVS- RRR, no murmur RESP-CTAB EXT- No edema Pulses- Radial 2+        Assessment & Plan:      Problem List Items Addressed This Visit   Tobacco use   Seasonal allergies   Renal insufficiency   Relevant Orders      Basic metabolic panel   Pulmonary emphysema   Relevant Medications      loratadine (CLARITIN) tablet 10 mg   Essential hypertension, benign - Primary   Relevant Medications      lisinopril (PRINIVIL,ZESTRIL) tablet      terazosin (HYTRIN) capsule   hydrochlorothiazide tablet   Other Relevant Orders      Lipid panel   Elevated fasting blood sugar   Relevant Orders      Hemoglobin A1c      Note: This dictation was prepared with Dragon dictation along with smaller phrase technology. Any transcriptional errors that result from this process are unintentional.

## 2014-04-02 NOTE — Assessment & Plan Note (Signed)
Trial of claritin 

## 2014-04-02 NOTE — Patient Instructions (Signed)
Continue current medications We will call with labs New allergy pill sent in F/U 3 MONTHS FOR PHYSICAL

## 2014-04-02 NOTE — Assessment & Plan Note (Signed)
Continue to cut back

## 2014-04-02 NOTE — Assessment & Plan Note (Signed)
Restart BP meds, check Cr, I may have to d/c the lisinopril

## 2014-04-02 NOTE — Assessment & Plan Note (Signed)
Recheck renal function. ?

## 2014-04-02 NOTE — Assessment & Plan Note (Signed)
Changes seen on imaging, he has never displayed any COPD/Empysema symptoms

## 2014-04-03 ENCOUNTER — Other Ambulatory Visit: Payer: Self-pay | Admitting: *Deleted

## 2014-04-03 DIAGNOSIS — R7989 Other specified abnormal findings of blood chemistry: Secondary | ICD-10-CM

## 2014-04-23 ENCOUNTER — Other Ambulatory Visit: Payer: Self-pay | Admitting: Family Medicine

## 2014-04-23 LAB — BASIC METABOLIC PANEL WITH GFR
BUN: 17 mg/dL (ref 6–23)
CO2: 22 meq/L (ref 19–32)
Calcium: 9.5 mg/dL (ref 8.4–10.5)
Chloride: 106 mEq/L (ref 96–112)
Creat: 1.43 mg/dL — ABNORMAL HIGH (ref 0.50–1.35)
GFR, EST AFRICAN AMERICAN: 59 mL/min — AB
GFR, Est Non African American: 51 mL/min — ABNORMAL LOW
GLUCOSE: 74 mg/dL (ref 70–99)
POTASSIUM: 4.8 meq/L (ref 3.5–5.3)
SODIUM: 138 meq/L (ref 135–145)

## 2014-04-30 ENCOUNTER — Encounter (INDEPENDENT_AMBULATORY_CARE_PROVIDER_SITE_OTHER): Payer: Self-pay | Admitting: *Deleted

## 2014-05-27 ENCOUNTER — Ambulatory Visit (HOSPITAL_COMMUNITY)
Admission: RE | Admit: 2014-05-27 | Discharge: 2014-05-27 | Disposition: A | Discharge status: Home / Self Care | Payer: Medicare Other | Source: Ambulatory Visit | Attending: Hematology and Oncology | Admitting: Hematology and Oncology

## 2014-05-27 ENCOUNTER — Encounter (HOSPITAL_COMMUNITY): Payer: Self-pay

## 2014-05-27 DIAGNOSIS — R9389 Abnormal findings on diagnostic imaging of other specified body structures: Secondary | ICD-10-CM | POA: Insufficient documentation

## 2014-05-27 DIAGNOSIS — C154 Malignant neoplasm of middle third of esophagus: Secondary | ICD-10-CM | POA: Insufficient documentation

## 2014-05-27 DIAGNOSIS — I2510 Atherosclerotic heart disease of native coronary artery without angina pectoris: Secondary | ICD-10-CM | POA: Insufficient documentation

## 2014-05-27 DIAGNOSIS — I723 Aneurysm of iliac artery: Secondary | ICD-10-CM | POA: Insufficient documentation

## 2014-05-27 MED ORDER — IOHEXOL 300 MG/ML  SOLN
100.0000 mL | Freq: Once | INTRAMUSCULAR | Status: AC | PRN
  Administered 2014-05-27: 100 mL via INTRAVENOUS

## 2014-05-29 ENCOUNTER — Encounter (HOSPITAL_COMMUNITY): Payer: Medicare Other

## 2014-05-29 ENCOUNTER — Encounter (HOSPITAL_COMMUNITY): Payer: Medicare Other | Attending: Hematology and Oncology

## 2014-05-29 ENCOUNTER — Telehealth (HOSPITAL_COMMUNITY): Payer: Self-pay

## 2014-05-29 ENCOUNTER — Encounter (HOSPITAL_COMMUNITY): Payer: Self-pay

## 2014-05-29 VITALS — BP 161/76 | HR 68 | Temp 98.0°F | Resp 18 | Wt 132.2 lb

## 2014-05-29 DIAGNOSIS — C154 Malignant neoplasm of middle third of esophagus: Secondary | ICD-10-CM

## 2014-05-29 DIAGNOSIS — C159 Malignant neoplasm of esophagus, unspecified: Secondary | ICD-10-CM

## 2014-05-29 DIAGNOSIS — C153 Malignant neoplasm of upper third of esophagus: Secondary | ICD-10-CM

## 2014-05-29 DIAGNOSIS — J449 Chronic obstructive pulmonary disease, unspecified: Secondary | ICD-10-CM

## 2014-05-29 DIAGNOSIS — R933 Abnormal findings on diagnostic imaging of other parts of digestive tract: Secondary | ICD-10-CM

## 2014-05-29 DIAGNOSIS — F17200 Nicotine dependence, unspecified, uncomplicated: Secondary | ICD-10-CM

## 2014-05-29 LAB — CBC WITH DIFFERENTIAL/PLATELET
Basophils Absolute: 0 10*3/uL (ref 0.0–0.1)
Basophils Relative: 1 % (ref 0–1)
EOS ABS: 0.2 10*3/uL (ref 0.0–0.7)
Eosinophils Relative: 2 % (ref 0–5)
HCT: 46.5 % (ref 39.0–52.0)
Hemoglobin: 16 g/dL (ref 13.0–17.0)
LYMPHS ABS: 1.7 10*3/uL (ref 0.7–4.0)
Lymphocytes Relative: 19 % (ref 12–46)
MCH: 33.5 pg (ref 26.0–34.0)
MCHC: 34.4 g/dL (ref 30.0–36.0)
MCV: 97.3 fL (ref 78.0–100.0)
MONOS PCT: 9 % (ref 3–12)
Monocytes Absolute: 0.8 10*3/uL (ref 0.1–1.0)
NEUTROS PCT: 69 % (ref 43–77)
Neutro Abs: 5.9 10*3/uL (ref 1.7–7.7)
Platelets: 207 10*3/uL (ref 150–400)
RBC: 4.78 MIL/uL (ref 4.22–5.81)
RDW: 11.6 % (ref 11.5–15.5)
WBC: 8.6 10*3/uL (ref 4.0–10.5)

## 2014-05-29 LAB — COMPREHENSIVE METABOLIC PANEL
ALK PHOS: 83 U/L (ref 39–117)
ALT: 11 U/L (ref 0–53)
AST: 18 U/L (ref 0–37)
Albumin: 4.5 g/dL (ref 3.5–5.2)
BILIRUBIN TOTAL: 0.9 mg/dL (ref 0.3–1.2)
BUN: 15 mg/dL (ref 6–23)
CHLORIDE: 96 meq/L (ref 96–112)
CO2: 23 mEq/L (ref 19–32)
Calcium: 9.5 mg/dL (ref 8.4–10.5)
Creatinine, Ser: 1.37 mg/dL — ABNORMAL HIGH (ref 0.50–1.35)
GFR calc non Af Amer: 52 mL/min — ABNORMAL LOW (ref >90)
GFR, EST AFRICAN AMERICAN: 61 mL/min — AB (ref >90)
Glucose, Bld: 94 mg/dL (ref 70–99)
POTASSIUM: 5 meq/L (ref 3.7–5.3)
Sodium: 132 mEq/L — ABNORMAL LOW (ref 137–147)
TOTAL PROTEIN: 7.8 g/dL (ref 6.0–8.3)

## 2014-05-29 LAB — LACTATE DEHYDROGENASE: LDH: 137 U/L (ref 94–250)

## 2014-05-29 NOTE — Progress Notes (Signed)
LABS DRAWN CBCD, CMP, LDH.

## 2014-05-29 NOTE — Progress Notes (Signed)
[facility]  OFFICE PROGRESS NOTE  Vic Blackbird, MD [address] [place] [address]  DIAGNOSIS: No diagnosis found.  No chief complaint on file.   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  INTERVAL HISTORY: Eddie [name] 68 y.o. 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 He's had increased cough the posterior nasal drip was started on loratadine by his family physician with improvement. He continues on Hytrin for BPH. He denies any dysphagia, a diet aphasia, regurgitation, fever, night sweats, abdominal pain, nausea, vomiting, diarrhea, constipation, peripheral paresthesias, skin rash, headache, or seizures.  MEDICAL HISTORY: Past Medical History  Diagnosis Date  . Hypertension   . Chronic back pain   . GERD (gastroesophageal reflux disease)   . Squamous cell carcinoma Oct 2014    Esophogus  . Squamous cell carcinoma of esophagus 09/25/2012  . Severe malnutrition 10/26/2012  . Duodenal ulcer 09/2012  . Helicobacter pylori (H. pylori) infection 09/2012    INTERIM HISTORY: has Essential hypertension, benign; Pulmonary emphysema; Tobacco use; Squamous cell carcinoma of esophagus; Edentulous; Duodenal ulcer; Seasonal allergies; Adenoma of large intestine; Elevated fasting blood sugar; and Renal insufficiency on his problem list.    ALLERGIES:  is allergic to motrin.  MEDICATIONS: has a current medication list which includes the following prescription(s): cyanocobalamin, hydrochlorothiazide, loratadine, and terazosin.  SURGICAL HISTORY:  Past Surgical History  Procedure Laterality Date  . Peg tube placement  09/21/2012  .  Esophagogastroduodenoscopy  09/21/2012    Procedure: ESOPHAGOGASTRODUODENOSCOPY (EGD);  Surgeon: Rogene Houston, MD;  Location: [facility];  Service: Endoscopy;  Laterality: N/A;  345  . Peg placement  09/21/2012    Procedure: PERCUTANEOUS ENDOSCOPIC GASTROSTOMY (PEG) PLACEMENT;  Surgeon: Rogene Houston, MD;  Location: [facility];  Service: Endoscopy;  Laterality: N/A;  . Portacath placement    . Removal of gastrostomy tube      august 2014  . Colonoscopy N/A 12/26/2013    Procedure: COLONOSCOPY;  Surgeon: Rogene Houston, MD;  Location: [facility];  Service: Endoscopy;  Laterality: N/A;  63    FAMILY HISTORY: family history includes Cancer in his brother; Hypertension in his father; Kidney disease in his daughter; Stroke in his mother.  SOCIAL HISTORY:  reports that he has been smoking Cigarettes.  He has a 50 pack-year smoking history. He has never used smokeless tobacco. He reports that he drinks about 3.6 ounces of alcohol per week. He reports that he does not use illicit drugs.  REVIEW OF SYSTEMS:  Other than that discussed above is noncontributory.  PHYSICAL EXAMINATION: ECOG PERFORMANCE STATUS: 1 - Symptomatic but completely ambulatory  Blood pressure 161/76, pulse 68, temperature 98 F (36.7 C), temperature source Oral, resp. rate 18, weight 132 lb 3.2 oz (59.966 kg), SpO2 96.00%.  GENERAL:alert, no distress and comfortable SKIN: skin color, texture, turgor are normal, no rashes or significant lesions EYES: PERLA; Conjunctiva are pink and non-injected, sclera clear SINUSES: No redness or tenderness over maxillary or ethmoid sinuses OROPHARYNX:no exudate, no erythema on lips, buccal mucosa, or tongue. NECK: supple, thyroid normal size, non-tender, without nodularity. No masses CHEST: Increased AP diameter with no breast masses. LYMPH:  no palpable lymphadenopathy in  the cervical, axillary or inguinal LUNGS: clear to auscultation and percussion with normal breathing  effort HEART: regular rate & rhythm and no murmurs. ABDOMEN:abdomen soft, non-tender and normal bowel sounds MUSCULOSKELETAL:no cyanosis of digits and no clubbing. Range of motion normal.  NEURO: alert & oriented x 3 with fluent speech, no focal motor/sensory deficits   LABORATORY DATA: Lab on 05/29/2014  Component Date Value Ref Range Status  . WBC 05/29/2014 8.6  4.0 - 10.5 K/uL Final  . RBC 05/29/2014 4.78  4.22 - 5.81 MIL/uL Final  . Hemoglobin 05/29/2014 16.0  13.0 - 17.0 g/dL Final  . HCT 05/29/2014 46.5  39.0 - 52.0 % Final  . MCV 05/29/2014 97.3  78.0 - 100.0 fL Final  . MCH 05/29/2014 33.5  26.0 - 34.0 pg Final  . MCHC 05/29/2014 34.4  30.0 - 36.0 g/dL Final  . RDW 05/29/2014 11.6  11.5 - 15.5 % Final  . Platelets 05/29/2014 207  150 - 400 K/uL Final  . Neutrophils Relative % 05/29/2014 69  43 - 77 % Final  . Neutro Abs 05/29/2014 5.9  1.7 - 7.7 K/uL Final  . Lymphocytes Relative 05/29/2014 19  12 - 46 % Final  . Lymphs Abs 05/29/2014 1.7  0.7 - 4.0 K/uL Final  . Monocytes Relative 05/29/2014 9  3 - 12 % Final  . Monocytes Absolute 05/29/2014 0.8  0.1 - 1.0 K/uL Final  . Eosinophils Relative 05/29/2014 2  0 - 5 % Final  . Eosinophils Absolute 05/29/2014 0.2  0.0 - 0.7 K/uL Final  . Basophils Relative 05/29/2014 1  0 - 1 % Final  . Basophils Absolute 05/29/2014 0.0  0.0 - 0.1 K/uL Final  . Sodium 05/29/2014 132* 137 - 147 mEq/L Final  . Potassium 05/29/2014 5.0  3.7 - 5.3 mEq/L Final  . Chloride 05/29/2014 96  96 - 112 mEq/L Final  . CO2 05/29/2014 23  19 - 32 mEq/L Final  . Glucose, Bld 05/29/2014 94  70 - 99 mg/dL Final  . BUN 05/29/2014 15  6 - 23 mg/dL Final  . Creatinine, Ser 05/29/2014 1.37* 0.50 - 1.35 mg/dL Final  . Calcium 05/29/2014 9.5  8.4 - 10.5 mg/dL Final  . Total Protein 05/29/2014 7.8  6.0 - 8.3 g/dL Final  . Albumin 05/29/2014 4.5  3.5 - 5.2 g/dL Final  . AST 05/29/2014 18  0 - 37 U/L Final  . ALT 05/29/2014 11  0 - 53 U/L Final  . Alkaline  Phosphatase 05/29/2014 83  39 - 117 U/L Final  . Total Bilirubin 05/29/2014 0.9  0.3 - 1.2 mg/dL Final  . GFR calc non Af Amer 05/29/2014 52* >90 mL/min Final  . GFR calc Af Amer 05/29/2014 61* >90 mL/min Final   Comment: (NOTE)                          The eGFR has been calculated using the CKD EPI equation.                          This calculation has not been validated in all clinical situations.                          eGFR's persistently <90 mL/min signify possible Chronic Kidney  Disease.  Marland Kitchen LDH 05/29/2014 137  94 - 250 U/L Final    PATHOLOGY: Squamous cell carcinoma  Urinalysis    Component Value Date/Time   COLORURINE YELLOW 10/26/2012 1539   APPEARANCEUR CLEAR 10/26/2012 1539   LABSPEC >1.030* 10/26/2012 1539   PHURINE 5.5 10/26/2012 1539   GLUCOSEU NEGATIVE 10/26/2012 1539   HGBUR TRACE* 10/26/2012 1539   BILIRUBINUR NEGATIVE 10/26/2012 1539   KETONESUR NEGATIVE 10/26/2012 1539   PROTEINUR NEGATIVE 10/26/2012 1539   UROBILINOGEN 0.2 10/26/2012 1539   NITRITE NEGATIVE 10/26/2012 1539   LEUKOCYTESUR NEGATIVE 10/26/2012 1539    RADIOGRAPHIC STUDIES: Ct Chest W Contrast  05/27/2014   CLINICAL DATA:  History of esophageal cancer diagnosed in 2013. Followup study.  EXAM: CT CHEST, ABDOMEN, AND PELVIS WITH CONTRAST  TECHNIQUE: Multidetector CT imaging of the chest, abdomen and pelvis was performed following the standard protocol during bolus administration of intravenous contrast.  CONTRAST:  155m OMNIPAQUE IOHEXOL 300 MG/ML  SOLN  COMPARISON:  CT of the chest, abdomen and pelvis 11/06/2013.  FINDINGS: CT CHEST FINDINGS  Mediastinum: Image 8 of series 2 demonstrates some mild asymmetric thickening of the esophageal wall in the proximal third of the esophagus, at this site of the original tumor. This has no masslike characteristics, and may simply be related to relative under distention of the esophagus at this level. The remainder the esophagus is  otherwise unremarkable in appearance. Heart size is normal. There is no significant pericardial fluid, thickening or pericardial calcification. There is atherosclerosis of the thoracic aorta, the great vessels of the mediastinum and the coronary arteries, including calcified atherosclerotic plaque in the left anterior descending coronary arteries. No pathologically enlarged mediastinal or hilar lymph nodes.  Lungs/Pleura: No suspicious appearing pulmonary nodules or masses are noted. No acute consolidative airspace disease. No pleural effusions.  Musculoskeletal: There are no aggressive appearing lytic or blastic lesions noted in the visualized portions of the skeleton.  CT ABDOMEN AND PELVIS FINDINGS  Abdomen/Pelvis: The appearance of the liver, gallbladder, pancreas, spleen, bilateral adrenal glands and right kidney is unremarkable. Sub cm low-attenuation lesion in the medial aspect of the upper pole of the right kidney is too small to characterize, but similar to prior studies, presumably a tiny cyst.  Atherosclerosis throughout the abdominal and pelvic vasculature, including mild aneurysmal dilatation of the left common iliac artery which measures up to 16 mm in diameter. No significant volume of ascites. No pneumoperitoneum. No pathologic distention of small bowel. No lymphadenopathy identified within the abdomen or pelvis. Nodular area of soft tissue prominence along the right spermatic cord (image 117 of series 2) has increased in prominence over several prior examinations. Prostate gland and urinary bladder are unremarkable in appearance.  Musculoskeletal: There are no aggressive appearing lytic or blastic lesions noted in the visualized portions of the skeleton.  IMPRESSION: 1. Subtle area of asymmetric soft tissue thickening in the proximal third of the esophagus at site of the original tumor. This may simply represent post treatment related scarring, however, clinical correlation is suggested and  consideration for repeat evaluation with endoscopy or PET-CT if there is any clinical concern for local recurrence of disease. 2. No evidence of metastatic disease in the chest, abdomen or pelvis on today's examination. 3. Progressive soft tissue thickening along the course of the right spermatic cord in the right inguinal region. Correlation with physical examination, and consideration for testicular ultrasound is suggested. 4. Atherosclerosis, including left anterior descending coronary artery disease. In addition, there is mild aneurysmal dilatation  of the left common iliac artery which measures 1.6 cm in diameter. Assessment for potential risk factor modification, dietary therapy or pharmacologic therapy may be warranted, if clinically indicated.   Electronically Signed   By: Vinnie Langton M.D.   On: 05/27/2014 17:16   Ct Abdomen Pelvis W Contrast  05/27/2014   CLINICAL DATA:  History of esophageal cancer diagnosed in 2013. Followup study.  EXAM: CT CHEST, ABDOMEN, AND PELVIS WITH CONTRAST  TECHNIQUE: Multidetector CT imaging of the chest, abdomen and pelvis was performed following the standard protocol during bolus administration of intravenous contrast.  CONTRAST:  131m OMNIPAQUE IOHEXOL 300 MG/ML  SOLN  COMPARISON:  CT of the chest, abdomen and pelvis 11/06/2013.  FINDINGS: CT CHEST FINDINGS  Mediastinum: Image 8 of series 2 demonstrates some mild asymmetric thickening of the esophageal wall in the proximal third of the esophagus, at this site of the original tumor. This has no masslike characteristics, and may simply be related to relative under distention of the esophagus at this level. The remainder the esophagus is otherwise unremarkable in appearance. Heart size is normal. There is no significant pericardial fluid, thickening or pericardial calcification. There is atherosclerosis of the thoracic aorta, the great vessels of the mediastinum and the coronary arteries, including calcified  atherosclerotic plaque in the left anterior descending coronary arteries. No pathologically enlarged mediastinal or hilar lymph nodes.  Lungs/Pleura: No suspicious appearing pulmonary nodules or masses are noted. No acute consolidative airspace disease. No pleural effusions.  Musculoskeletal: There are no aggressive appearing lytic or blastic lesions noted in the visualized portions of the skeleton.  CT ABDOMEN AND PELVIS FINDINGS  Abdomen/Pelvis: The appearance of the liver, gallbladder, pancreas, spleen, bilateral adrenal glands and right kidney is unremarkable. Sub cm low-attenuation lesion in the medial aspect of the upper pole of the right kidney is too small to characterize, but similar to prior studies, presumably a tiny cyst.  Atherosclerosis throughout the abdominal and pelvic vasculature, including mild aneurysmal dilatation of the left common iliac artery which measures up to 16 mm in diameter. No significant volume of ascites. No pneumoperitoneum. No pathologic distention of small bowel. No lymphadenopathy identified within the abdomen or pelvis. Nodular area of soft tissue prominence along the right spermatic cord (image 117 of series 2) has increased in prominence over several prior examinations. Prostate gland and urinary bladder are unremarkable in appearance.  Musculoskeletal: There are no aggressive appearing lytic or blastic lesions noted in the visualized portions of the skeleton.  IMPRESSION: 1. Subtle area of asymmetric soft tissue thickening in the proximal third of the esophagus at site of the original tumor. This may simply represent post treatment related scarring, however, clinical correlation is suggested and consideration for repeat evaluation with endoscopy or PET-CT if there is any clinical concern for local recurrence of disease. 2. No evidence of metastatic disease in the chest, abdomen or pelvis on today's examination. 3. Progressive soft tissue thickening along the course of the  right spermatic cord in the right inguinal region. Correlation with physical examination, and consideration for testicular ultrasound is suggested. 4. Atherosclerosis, including left anterior descending coronary artery disease. In addition, there is mild aneurysmal dilatation of the left common iliac artery which measures 1.6 cm in diameter. Assessment for potential risk factor modification, dietary therapy or pharmacologic therapy may be warranted, if clinically indicated.   Electronically Signed   By: DVinnie LangtonM.D.   On: 05/27/2014 17:16    ASSESSMENT:  #1. Locally advanced squamous cell carcinoma  the upper esophagus, status post continuous infusion 5-FU plus external beam radiotherapy, now with abnormality in CT scan different from previous scan done in December of 2014. #2. Chronic obstructive pulmonary disease, continuing to smoke. #3. Allergic rhinitis, on treatment. #4. Hypertension, controlled. #5. Status post colonoscopy with tubular adenoma resected.   PLAN:  #1. PET CT scan to further characterize the CT scan abnormality. #2. Followup today after PET scan is done for discussion of results. If positive, would suggest repeat upper endoscopy with biopsy to determine histology and also to send tissue for [facility] One analysis to aid in drug selection for management.  All questions were answered. The patient knows to call the clinic with any problems, questions or concerns. We can certainly see the patient much sooner if necessary.   I spent 25 minutes counseling the patient face to face. The total time spent in the appointment was 30 minutes.    Doroteo Bradford, MD 05/29/2014 10:22 AM  DISCLAIMER:  This note was dictated with voice recognition software.  Similar sounding words can inadvertently be transcribed inaccurately and may not be corrected upon review.

## 2014-05-29 NOTE — Patient Instructions (Signed)
[facility] Discharge Instructions  RECOMMENDATIONS MADE BY THE CONSULTANT AND ANY TEST RESULTS WILL BE SENT TO YOUR REFERRING PHYSICIAN.  We will call you for a PET scan appointment.  After the scan, you will have a follow up scheduled with the doctor.  Please call if you do not hear from [place].   Thank you for choosing [facility] to provide your oncology and hematology care.  To afford each patient quality time with our providers, please arrive at least 15 minutes before your scheduled appointment time.  With your help, our goal is to use those 15 minutes to complete the necessary work-up to ensure our physicians have the information they need to help with your evaluation and healthcare recommendations.    Effective January 1st, 2014, we ask that you re-schedule your appointment with our physicians should you arrive 10 or more minutes late for your appointment.  We strive to give you quality time with our providers, and arriving late affects you and other patients whose appointments are after yours.    Again, thank you for choosing [facility].  Our hope is that these requests will decrease the amount of time that you wait before being seen by our physicians.       _____________________________________________________________  Should you have questions after your visit to [facility], please contact our office at [phone] between the hours of 8:30 a.m. and 4:30 p.m.  Voicemails left after 4:30 p.m. will not be returned until the following business day.  For prescription refill requests, have your pharmacy contact our office with your prescription refill request.    _______________________________________________________________  We hope that we have given you very good care.  You may receive a patient satisfaction survey in the mail, please complete it and return it as soon as possible.  We value your feedback!

## 2014-06-05 ENCOUNTER — Encounter (HOSPITAL_COMMUNITY): Payer: Self-pay

## 2014-06-05 ENCOUNTER — Ambulatory Visit (HOSPITAL_COMMUNITY)
Admission: RE | Admit: 2014-06-05 | Discharge: 2014-06-05 | Disposition: A | Discharge status: Home / Self Care | Payer: Medicare Other | Source: Ambulatory Visit | Attending: Hematology and Oncology | Admitting: Hematology and Oncology

## 2014-06-05 DIAGNOSIS — I2510 Atherosclerotic heart disease of native coronary artery without angina pectoris: Secondary | ICD-10-CM | POA: Insufficient documentation

## 2014-06-05 DIAGNOSIS — J438 Other emphysema: Secondary | ICD-10-CM | POA: Insufficient documentation

## 2014-06-05 DIAGNOSIS — C159 Malignant neoplasm of esophagus, unspecified: Secondary | ICD-10-CM | POA: Insufficient documentation

## 2014-06-05 LAB — GLUCOSE, CAPILLARY: GLUCOSE-CAPILLARY: 93 mg/dL (ref 70–99)

## 2014-06-05 MED ORDER — FLUDEOXYGLUCOSE F - 18 (FDG) INJECTION
7.0000 | Freq: Once | INTRAVENOUS | Status: AC | PRN
  Administered 2014-06-05: 7 via INTRAVENOUS

## 2014-06-09 ENCOUNTER — Telehealth (HOSPITAL_COMMUNITY): Payer: Self-pay

## 2014-06-09 ENCOUNTER — Ambulatory Visit (HOSPITAL_COMMUNITY): Payer: Self-pay

## 2014-06-09 NOTE — Telephone Encounter (Signed)
Message copied by Mellissa Kohut on Mon Jun 09, 2014  9:50 AM ------      Message from: Farrel Gobble A      Created: Thu Jun 05, 2014 12:47 PM       Please inform Mr. Witzke that his PET scan showed no evidence of cancer and that he should just keep his future appointment with the gastroenterologist for EGD in August.  Thanks.  ------

## 2014-06-09 NOTE — Telephone Encounter (Signed)
Patient notified and verbalized understanding of instructions. 

## 2014-06-09 NOTE — Telephone Encounter (Signed)
Discussed with patient, per Dr. Barnet Glasgow, he does not have to be seen until October.  Appointment rescheduled for 09/09/14.  Verbalized understanding of instructions.

## 2014-06-09 NOTE — Telephone Encounter (Signed)
Message copied by Mellissa Kohut on Mon Jun 09, 2014  9:41 AM ------      Message from: Hedley, Eldred: Thu Jun 05, 2014 12:47 PM       Please inform Mr. Sawyers that his PET scan showed no evidence of cancer and that he should just keep his future appointment with the gastroenterologist for EGD in August.  Thanks.  ------

## 2014-07-02 ENCOUNTER — Ambulatory Visit (INDEPENDENT_AMBULATORY_CARE_PROVIDER_SITE_OTHER): Payer: Medicare Other | Admitting: Family Medicine

## 2014-07-02 ENCOUNTER — Encounter: Payer: Self-pay | Admitting: Family Medicine

## 2014-07-02 VITALS — BP 136/70 | HR 68 | Temp 98.1°F | Resp 12 | Ht 68.0 in | Wt 132.0 lb

## 2014-07-02 DIAGNOSIS — Z720 Tobacco use: Secondary | ICD-10-CM

## 2014-07-02 DIAGNOSIS — F17200 Nicotine dependence, unspecified, uncomplicated: Secondary | ICD-10-CM

## 2014-07-02 DIAGNOSIS — N182 Chronic kidney disease, stage 2 (mild): Secondary | ICD-10-CM

## 2014-07-02 DIAGNOSIS — Z125 Encounter for screening for malignant neoplasm of prostate: Secondary | ICD-10-CM

## 2014-07-02 DIAGNOSIS — Z0001 Encounter for general adult medical examination with abnormal findings: Secondary | ICD-10-CM | POA: Insufficient documentation

## 2014-07-02 DIAGNOSIS — N400 Enlarged prostate without lower urinary tract symptoms: Secondary | ICD-10-CM

## 2014-07-02 DIAGNOSIS — N183 Chronic kidney disease, stage 3 unspecified: Secondary | ICD-10-CM | POA: Insufficient documentation

## 2014-07-02 DIAGNOSIS — Z0000 Encounter for general adult medical examination without abnormal findings: Secondary | ICD-10-CM

## 2014-07-02 DIAGNOSIS — I10 Essential (primary) hypertension: Secondary | ICD-10-CM

## 2014-07-02 LAB — BASIC METABOLIC PANEL
BUN: 20 mg/dL (ref 6–23)
CHLORIDE: 105 meq/L (ref 96–112)
CO2: 23 meq/L (ref 19–32)
Calcium: 9.8 mg/dL (ref 8.4–10.5)
Creat: 1.44 mg/dL — ABNORMAL HIGH (ref 0.50–1.35)
Glucose, Bld: 79 mg/dL (ref 70–99)
POTASSIUM: 4.7 meq/L (ref 3.5–5.3)
SODIUM: 139 meq/L (ref 135–145)

## 2014-07-02 NOTE — Assessment & Plan Note (Signed)
Much improved, continue HCTZ

## 2014-07-02 NOTE — Assessment & Plan Note (Signed)
Counseled on cessation 

## 2014-07-02 NOTE — Assessment & Plan Note (Signed)
CPE done See note

## 2014-07-02 NOTE — Patient Instructions (Signed)
COntinue current medications We will cal with your lab results F/U 6 months

## 2014-07-02 NOTE — Progress Notes (Signed)
Patient ID: Eddie [name], male   DOB: 04-06-1946, 68 y.o.   MRN: [id number]  Subjective:   Patient presents for Medicare Annual/Subsequent preventive examination.  Is here for complete physical exam. He has no specific concerns. His medications were reviewed. His blood pressure is much improved on hydrochlorothiazide. He did have some renal labs done about 5 weeks ago which showed improvement in his renal function off of the lisinopril. He continues to smoke a least a half a pack per day he is trying to quit. He did try nicotine patches because a local reaction on his arm.  PCP and drinking well he does occasionally get a cough after each but as long as he has liquids with his meals he does not have any difficulties. He did have a recent PET scan to followup for squamous cell carcinoma of the esophagus there's been no recurrence of the cancer. He has an appointment with gastroenterology next month.   Review Past Medical/Family/Social: Per EMR   Risk Factors  Current exercise habits: None Dietary issues discussed: Yes   Cardiac risk factors: HTN  Depression Screen  (Note: if answer to either of the following is "Yes", a more complete depression screening is indicated)  Over the past two weeks, have you felt down, depressed or hopeless? No Over the past two weeks, have you felt little interest or pleasure in doing things? No Have you lost interest or pleasure in daily life? No Do you often feel hopeless? No Do you cry easily over simple problems? No   Activities of Daily Living  In your present state of health, do you have any difficulty performing the following activities?:  Driving? No  Managing money? No  Feeding yourself? No  Getting from bed to chair? No  Climbing a flight of stairs? No  Preparing food and eating?: No  Bathing or showering? No  Getting dressed: No  Getting to the toilet? No  Using the toilet:No  Moving around from place to place: No  In the past year have you  fallen or had a near fall?:No  Are you sexually active? No  Do you have more than one partner? No   Hearing Difficulties: No  Do you often ask people to speak up or repeat themselves? No  Do you experience ringing or noises in your ears? No Do you have difficulty understanding soft or whispered voices? No  Do you feel that you have a problem with memory? No Do you often misplace items? No  Do you feel safe at home? Yes  Cognitive Testing  Alert? Yes Normal Appearance?Yes  Oriented to person? Yes Place? Yes  Time? Yes  Recall of three objects? Yes  Can perform simple calculations? Yes  Displays appropriate judgment?Yes  Can read the correct time from a watch face?Yes   List the Names of Other Physician/Practitioners you currently use:   Oncology- [facility] , GI- Dr. Laural Golden  Screening Tests / Date Colonoscopy  -UTD                   Zostavax - Due Influenza Vaccine -UTD Tetanus/tdap- Due   GEN- denies fatigue, fever, weight loss,weakness, recent illness HEENT- denies eye drainage, change in vision, nasal discharge, CVS- denies chest pain, palpitations RESP- denies SOB, cough, wheeze ABD- denies N/V, change in stools, abd pain GU- denies dysuria, hematuria, dribbling, incontinence MSK- denies joint pain, muscle aches, injury Neuro- denies headache, dizziness, syncope, seizure activity   GEN- NAD, alert and oriented x3 HEENT-  PERRL, EOMI, non injected sclera, pink conjunctiva, MMM, oropharynx clear Neck- Supple, no LAD CVS- RRR, no murmur RESP-CTAB ABD-NABS,soft,NT,ND GU- deferred EXT- No edema Pulses- Radial, DP- 2+   Assessment:    Annual wellness medicare exam   Plan:    During the course of the visit the patient was educated and counseled about appropriate screening and preventive services including:    Shingles/TDAP vaccine. Prescription given to that she can get the vaccine at the pharmacy or Medicare part D.   Diet review for nutrition referral? Yes ____  Not Indicated __x__  Patient Instructions (the written plan) was given to the patient.  Medicare Attestation  I have personally reviewed:  The patient's medical and social history  Their use of alcohol, tobacco or illicit drugs  Their current medications and supplements  The patient's functional ability including ADLs,fall risks, home safety risks, cognitive, and hearing and visual impairment  Diet and physical activities  Evidence for depression or mood disorders  The patient's weight, height, BMI, and visual acuity have been recorded in the chart. I have made referrals, counseling, and provided education to the patient based on review of the above and I have provided the patient with a written personalized care plan for preventive services.

## 2014-07-03 LAB — PSA, MEDICARE: PSA: 1.23 ng/mL (ref <=4.00)

## 2014-07-15 ENCOUNTER — Encounter (INDEPENDENT_AMBULATORY_CARE_PROVIDER_SITE_OTHER): Payer: Self-pay | Admitting: Internal Medicine

## 2014-07-15 ENCOUNTER — Ambulatory Visit (INDEPENDENT_AMBULATORY_CARE_PROVIDER_SITE_OTHER): Payer: Medicare Other | Admitting: Internal Medicine

## 2014-07-15 VITALS — BP 118/76 | HR 80 | Temp 97.6°F | Ht 67.0 in | Wt 132.2 lb

## 2014-07-15 DIAGNOSIS — Z8501 Personal history of malignant neoplasm of esophagus: Secondary | ICD-10-CM | POA: Insufficient documentation

## 2014-07-15 DIAGNOSIS — C159 Malignant neoplasm of esophagus, unspecified: Secondary | ICD-10-CM

## 2014-07-15 NOTE — Patient Instructions (Signed)
OV in 1 yr. 

## 2014-07-15 NOTE — Progress Notes (Signed)
Subjective:     Patient ID: Eddie [name], male   DOB: [birth year], 68 y.o.   MRN: [id number]  HPI Hx of squamous cell carcinoma of the esophagus, diagnosed in October of 2013.  Treatment completed in January of 2014 utilizing continuous infusion 5-fluorouracil in conjunction with external beam radiotherapy. He tells me he is doing good. He has gained 2 pounds since January. He is eating what he wants. No acid reflux.  No dysphagia.  He does have a dry cough when he is lying down.  No chest pain.  He continues to smoke 1/2 pack a day for at least 50 yrs. He continues to drink beer daily. Usually drinks a 16oz beer daily.    05/27/2014 PET Scan: emphysema. Left common iliac dilatation.  IMPRESSION:  1. No evidence of residual/recurrent disease, including within the  area of soft tissue fullness in the upper esophagus detailed on  prior CT.  2. No evidence of metastatic disease. Foci of hypermetabolism within  the neck and chest, consistent with hypermetabolic "Brown" fat. This  slightly decreases sensitivity for hypermetabolic nodes.  3. Incidental findings, including coronary artery atherosclerosis.   12/26/2013 Colonoscopy: Dr. Laural Golden. Routine screening: Impression:  Examination performed to cecum.  8 mm pedunculated polyp snared from distal sigmoid colon.  Small external hemorrhoids.   Notes Recorded by Rogene Houston, MD on 12/29/2013 at 7:24 PM Polyp is a tubular adenoma. Size 8 mm. Results reviewed with patient Next TCS in 5 years          Review of Systems Past Medical History  Diagnosis Date  . Hypertension   . Chronic back pain   . GERD (gastroesophageal reflux disease)   . Squamous cell carcinoma Oct 2014    Esophogus  . Squamous cell carcinoma of esophagus 09/25/2012  . Severe malnutrition 10/26/2012  . Duodenal ulcer 09/2012  . Helicobacter pylori (H. pylori) infection 09/2012    Past Surgical History  Procedure Laterality Date  . Peg tube placement   09/21/2012  . Esophagogastroduodenoscopy  09/21/2012    Procedure: ESOPHAGOGASTRODUODENOSCOPY (EGD);  Surgeon: Rogene Houston, MD;  Location[facility];  Service: Endoscopy;  Laterality: N/A;  345  . Peg placement  09/21/2012    Procedure: PERCUTANEOUS ENDOSCOPIC GASTROSTOMY (PEG) PLACEMENT;  Surgeon: Rogene Houston, MD;  Location: [facility];  Service: Endoscopy;  Laterality: N/A;  . Portacath placement    . Removal of gastrostomy tube      august 2014  . Colonoscopy N/A 12/26/2013    Procedure: COLONOSCOPY;  Surgeon: Rogene Houston, MD;  Location: [facility];  Service: Endoscopy;  Laterality: N/A;  830    Allergies  Allergen Reactions  . Motrin [Ibuprofen] Swelling    Current Outpatient Prescriptions on File Prior to Visit  Medication Sig Dispense Refill  . Cyanocobalamin (VITAMIN B-12 PO) Take 1 tablet by mouth daily.      . hydrochlorothiazide (HYDRODIURIL) 25 MG tablet Take 1 tablet (25 mg total) by mouth daily.  30 tablet  11  . loratadine (CLARITIN) 10 MG tablet Take 1 tablet (10 mg total) by mouth daily.  30 tablet  6  . terazosin (HYTRIN) 1 MG capsule Take 1 capsule (1 mg total) by mouth at bedtime.  30 capsule  11   No current facility-administered medications on file prior to visit.        Objective:   Physical Exam  Filed Vitals:   07/15/14 1021  BP: 118/76  Pulse: 80  Temp: 97.6 F (36.4 C)  Height: 5\' 7"  (1.702 m)  Weight: 132 lb 3.2 oz (59.966 kg)   Alert and oriented. Skin warm and dry. Oral mucosa is moist.   . Sclera anicteric, conjunctivae is pink. Thyroid not enlarged. No cervical lymphadenopathy. Lungs clear. Heart regular rate and rhythm.  Abdomen is soft. Bowel sounds are positive. No hepatomegaly. No abdominal masses felt. No tenderness.  No edema to lower extremities.      Assessment:     Hx of esophageal cancer. Presently in remission. Negative PET scan in June.     Plan:    OV in 1 year.

## 2014-09-09 ENCOUNTER — Encounter (HOSPITAL_COMMUNITY): Payer: Medicare Other | Attending: Hematology

## 2014-09-09 ENCOUNTER — Encounter (HOSPITAL_COMMUNITY): Payer: Self-pay

## 2014-09-09 VITALS — HR 80 | Temp 98.6°F | Resp 16 | Wt 133.2 lb

## 2014-09-09 DIAGNOSIS — C159 Malignant neoplasm of esophagus, unspecified: Secondary | ICD-10-CM

## 2014-09-09 DIAGNOSIS — Z23 Encounter for immunization: Secondary | ICD-10-CM

## 2014-09-09 MED ORDER — INFLUENZA VAC SPLIT QUAD 0.5 ML IM SUSY
0.5000 mL | PREFILLED_SYRINGE | Freq: Once | INTRAMUSCULAR | Status: AC
  Administered 2014-09-09: 0.5 mL via INTRAMUSCULAR
  Filled 2014-09-09: qty 0.5

## 2014-09-09 NOTE — Patient Instructions (Signed)
Eddie [name] Discharge Instructions  RECOMMENDATIONS MADE BY THE CONSULTANT AND ANY TEST RESULTS WILL BE SENT TO YOUR REFERRING PHYSICIAN.  EXAM FINDINGS BY THE PHYSICIAN TODAY AND SIGNS OR SYMPTOMS TO REPORT TO CLINIC OR PRIMARY PHYSICIAN: Exam and findings as discussed by Dr. Bubba Hales. PET scan was good.  You are doing well.  Report unexplained weight loss, difficulty swallowing or other concerns.     INSTRUCTIONS/FOLLOW-UP: Follow-up in 4 months.  Thank you for choosing [facility] to provide your oncology and hematology care.  To afford each patient quality time with our providers, please arrive at least 15 minutes before your scheduled appointment time.  With your help, our goal is to use those 15 minutes to complete the necessary work-up to ensure our physicians have the information they need to help with your evaluation and healthcare recommendations.    Effective January 1st, 2014, we ask that you re-schedule your appointment with our physicians should you arrive 10 or more minutes late for your appointment.  We strive to give you quality time with our providers, and arriving late affects you and other patients whose appointments are after yours.    Again, thank you for choosing [facility].  Our hope is that these requests will decrease the amount of time that you wait before being seen by our physicians.       _____________________________________________________________  Should you have questions after your visit to [facility], please contact our office at [phone] between the hours of 8:30 a.m. and 4:30 p.m.  Voicemails left after 4:30 p.m. will not be returned until the following business day.  For prescription refill requests, have your pharmacy contact our office with your prescription refill request.    _______________________________________________________________  We hope that we have given you very good care.   You may receive a patient satisfaction survey in the mail, please complete it and return it as soon as possible.  We value your feedback!  _______________________________________________________________  Have you asked about our STAR program?  STAR stands for Survivorship Training and Rehabilitation, and this is a nationally recognized cancer care program that focuses on survivorship and rehabilitation.  Cancer and cancer treatments may cause problems, such as, pain, making you feel tired and keeping you from doing the things that you need or want to do. Cancer rehabilitation can help. Our goal is to reduce these troubling effects and help you have the best quality of life possible.  You may receive a survey from a nurse that asks questions about your current state of health.  Based on the survey results, all eligible patients will be referred to the [facility] program for an evaluation so we can better serve you!  A frequently asked questions sheet is available upon request.

## 2014-09-09 NOTE — Progress Notes (Signed)
Eddie [name] presents today for injection per MD orders. Flu vaccine administered IM in left Upper Arm. Administration without incident. Patient tolerated well.

## 2014-09-10 NOTE — Progress Notes (Signed)
[facility] OFFICE PROGRESS NOTE  PCP Vic Blackbird, MD [address] [place] [address]  DIAGNOSIS: Esophageal cancer  CURRENT THERAPY:  Surveillance after combined modality medical therapy for 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 Treatment comopleted in January 2014 utilizing a continuous infusion of 5FU in conjunction with external beam radiotherapy.  INTERVAL ONCOLOGY HX: Mr. Eddie [name] is a 68 yo man returning to the clinic for routine post treatment followup. He has no dysphagia or other upper digestive complaints at this time. In January 2015 he underwent a colonoscopy by Dr. Laural Golden with benign findings (tubular adenoma). He has not had a post treatment EGD. Appetite is good, weight is stable, and does not complain of abdominal discomfort since removal of his PEG tube.       MEDICAL HISTORY:  Past Medical History  Diagnosis Date  . Hypertension   . Chronic back pain   . GERD (gastroesophageal reflux disease)   . Squamous cell carcinoma Oct 2014    Esophogus  . Squamous cell carcinoma of esophagus 09/25/2012  . Severe malnutrition 10/26/2012  . Duodenal ulcer 09/2012  . Helicobacter pylori (H. pylori) infection 09/2012    has Essential hypertension, benign; Pulmonary emphysema; Tobacco use; Squamous cell carcinoma of esophagus; Edentulous; Duodenal ulcer; Seasonal allergies; Adenoma of large intestine; Elevated fasting blood sugar; Routine general medical examination at a health care facility; CKD (chronic kidney disease), stage II; and Esophageal cancer on his problem list.    ALLERGIES:  is allergic to motrin.  MEDICATIONS: has a current medication list which includes the following prescription(s): cyanocobalamin, hydrochlorothiazide, lisinopril, loratadine, and terazosin.  FAMILY HISTORY: family history includes Cancer in his brother; Hypertension in his father; Kidney disease in his daughter; Stroke in his  mother.  REVIEW OF SYSTEMS:    SINCE YOUR LAST VISIT Been diagnosed or treated for a new medical /surgical  problem or condition: No Any Recent Xrays or studies performed: Yes Any new prescription or OTC medications: No ECOG Perf Status: Fully active, able to carry on all pre-disease performance without restriction Problems sleeping: No Medications taken to help sleep: No How is your appetie: 100% normal Any Supplements: No Any trouble chewing or swallowing: No Any Nausea or Vomiting: No Any Bowel problems: No # Bowel Movements per week: 7 Any Urinary Issues: No Any Cardiac Problems: No Any Respiratory Issues: No Any Neurological Issues: No Do you live alone: No Feelings hopelessness: No You or your family have any concerns or Health changes: No Pain Assessment Pain Score: 0-No pain  Other than that discussed above is noncontributory.    PHYSICAL EXAMINATION:   weight is 133 lb 3.2 oz (60.419 kg). His oral temperature is 98.6 F (37 C). His pulse is 80. His respiration is 16 and oxygen saturation is 100%.  BP 143/93.   GENERAL: alert, no distress and comfortable SKIN: skin color, texture, turgor are normal, no rashes or significant lesions. No cyanosis of digits and no clubbing.  EYES: PERLA; Conjunctiva are pink and non-injected, sclera clear OROPHARYNX: no exudate, no erythema on lips, buccal mucosa, or tongue. NECK: supple, non-tender, without nodularity. No masses LYMPH:  no palpable lymphadenopathy in the cervical, axillary or inguinal LUNGS: clear to auscultation and percussion with normal breathing effort.  HEART: regular rate & rhythm and no murmurs.  ABDOMEN: abdomen soft, non-tender and normal bowel sounds, no masses palpated. MUSCULOSKELETAL/EXTREMITIES: Range of motion normal. No peripheral edema. NEURO: alert & oriented x 3  with fluent speech, no focal motor/sensory deficits.   LABORATORY DATA: No visits with results within 30 Day(s) from this  visit. Latest known visit with results is:  Office Visit on 07/02/2014  Component Date Value Ref Range Status  . PSA 07/02/2014 1.23  <=4.00 ng/mL Final   Comment: Test Methodology: ECLIA PSA (Electrochemiluminescence Immunoassay)                                                     For PSA values from 2.5-4.0, particularly in younger men <60 years                          old, the AUA and NCCN suggest testing for % Free PSA (3515) and                          evaluation of the rate of increase in PSA (PSA velocity).  . Sodium 07/02/2014 139  135 - 145 mEq/L Final  . Potassium 07/02/2014 4.7  3.5 - 5.3 mEq/L Final  . Chloride 07/02/2014 105  96 - 112 mEq/L Final  . CO2 07/02/2014 23  19 - 32 mEq/L Final  . Glucose, Bld 07/02/2014 79  70 - 99 mg/dL Final  . BUN 07/02/2014 20  6 - 23 mg/dL Final  . Creat 07/02/2014 1.44* 0.50 - 1.35 mg/dL Final  . Calcium 07/02/2014 9.8  8.4 - 10.5 mg/dL Final    RADIOGRAPHIC STUDIES: No results found since restaging scans in June and findings recorded in EPIC including negative PET scan for recurrent or metastatic disease. Soft tissue thickening in proximal third of esophagus probably treatment related. Right spermatic cord thickening    ASSESSMENT:   1. Locally advanced SCCa of the esophagus clinically in complete remission.  2. Patient continues to smoke.  3. Abnormal appearance of the spermatic cord   RECOMMENDATIONS:   Continued surveillance of Mr. Angert esophageal cancer at 3 month intervals. Consider adding an EGD prior to his next appointment. Will defer this to Dr. Laural Golden. Urology evaluation regarding abnormal appearance of right cord. Deferring to Dr. Buelah Manis.    All questions were answered. The patient knows to call the clinic with any problems, questions or concerns. We can certainly see the patient much sooner if necessary.    Darrall Dears, MD 09/10/2014 8:44 AM

## 2014-09-12 ENCOUNTER — Other Ambulatory Visit: Payer: Self-pay | Admitting: Family Medicine

## 2014-09-12 DIAGNOSIS — N491 Inflammatory disorders of spermatic cord, tunica vaginalis and vas deferens: Secondary | ICD-10-CM

## 2014-09-12 DIAGNOSIS — N5089 Other specified disorders of the male genital organs: Secondary | ICD-10-CM

## 2014-09-12 NOTE — Progress Notes (Signed)
Please call pt, he needs [place] of his scotum, he has some inflammation that was seen on his CT scan with his cancer doctor. THis will be set up at [facility]. To rule out any evidence of cancer

## 2014-09-12 NOTE — Progress Notes (Signed)
[place] is scheduled for Oct 12 at 12:15 pm at [facility] radiology, pt is aware of appt

## 2014-09-15 ENCOUNTER — Other Ambulatory Visit: Payer: Self-pay | Admitting: Family Medicine

## 2014-09-15 ENCOUNTER — Ambulatory Visit (HOSPITAL_COMMUNITY)
Admission: RE | Admit: 2014-09-15 | Discharge: 2014-09-15 | Disposition: A | Discharge status: Home / Self Care | Payer: Medicare Other | Source: Ambulatory Visit | Attending: Family Medicine | Admitting: Family Medicine

## 2014-09-15 DIAGNOSIS — N498 Inflammatory disorders of other specified male genital organs: Secondary | ICD-10-CM | POA: Insufficient documentation

## 2014-09-15 DIAGNOSIS — N5089 Other specified disorders of the male genital organs: Secondary | ICD-10-CM

## 2014-09-15 DIAGNOSIS — N508 Other specified disorders of male genital organs: Secondary | ICD-10-CM | POA: Diagnosis present

## 2014-09-15 DIAGNOSIS — N491 Inflammatory disorders of spermatic cord, tunica vaginalis and vas deferens: Secondary | ICD-10-CM

## 2014-09-15 DIAGNOSIS — C159 Malignant neoplasm of esophagus, unspecified: Secondary | ICD-10-CM | POA: Diagnosis not present

## 2014-09-24 IMAGING — CT CT ABD-PELV W/ CM
2 of 5 series · 16 of 46 positions shown, 18 images · IV contrast (Omnipaque 300)
Comparison: None

CLINICAL DATA: Esophageal mass, history hypertension, GERD

CT ABDOMEN AND PELVIS WITH CONTRAST
TECHNIQUE: Multidetector CT imaging of the abdomen and pelvis was
performed following the standard protocol during bolus
administration of intravenous contrast. Sagittal and coronal MPR
images reconstructed from axial data set.
Contrast: 100mL OMNIPAQUE IOHEXOL 300 MG/ML  SOLN Dilute oral
contrast.

[Series 2: abd_pel_with 5.0 b40f · axial · 0.60mm/px · z∈[-490,-90]mm · 13 of 90 slices shown, 15 images]
[im 5/90  soft-tissue]
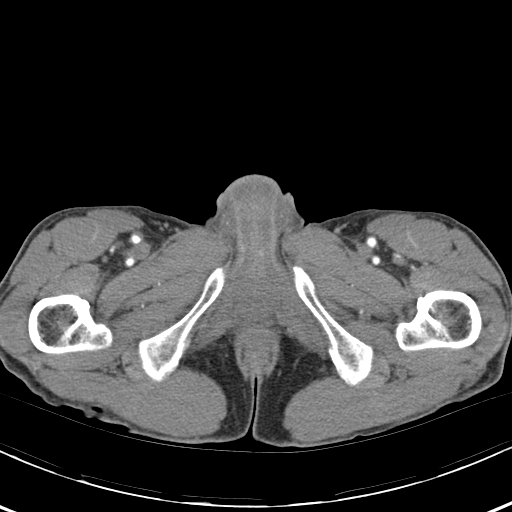
[im 5/90  bone]
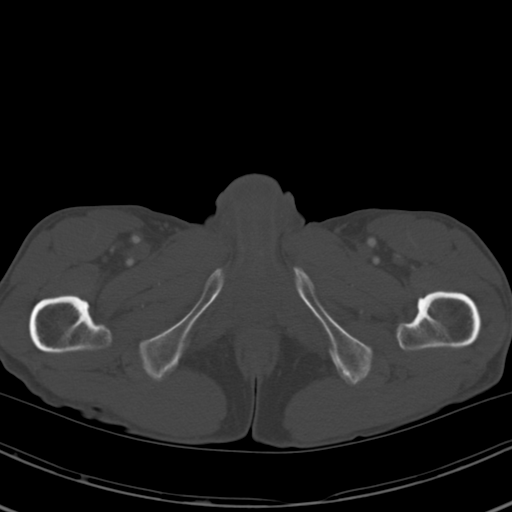
[im 14/90  soft-tissue]
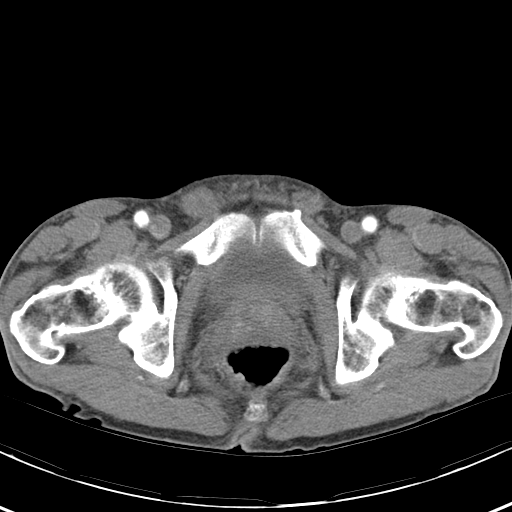
[im 18/90  soft-tissue]
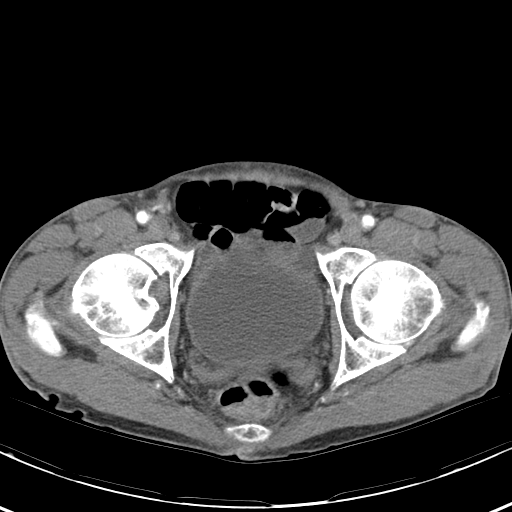
[im 27/90  soft-tissue]
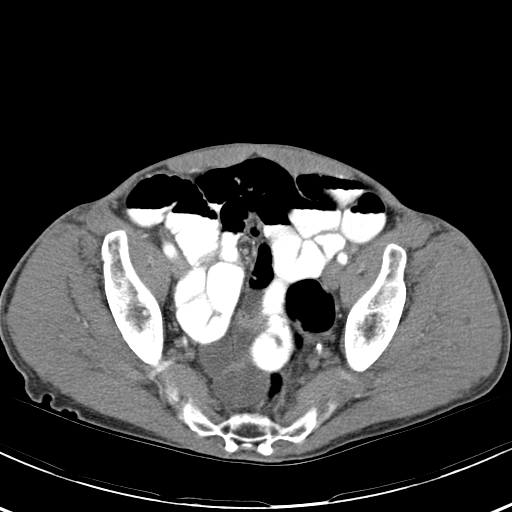
[im 32/90  soft-tissue]
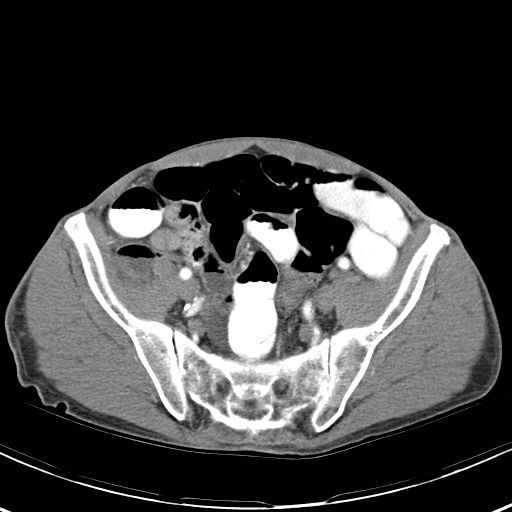
[im 41/90  soft-tissue]
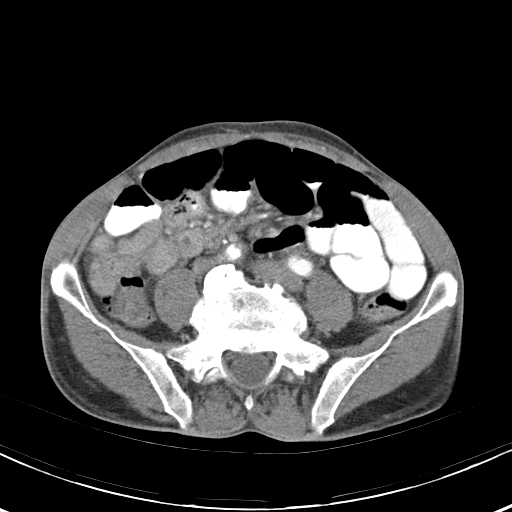
[im 45/90  soft-tissue]
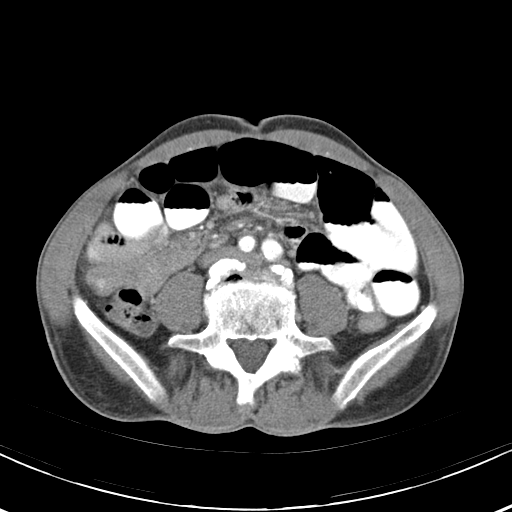
[im 49/90  soft-tissue]
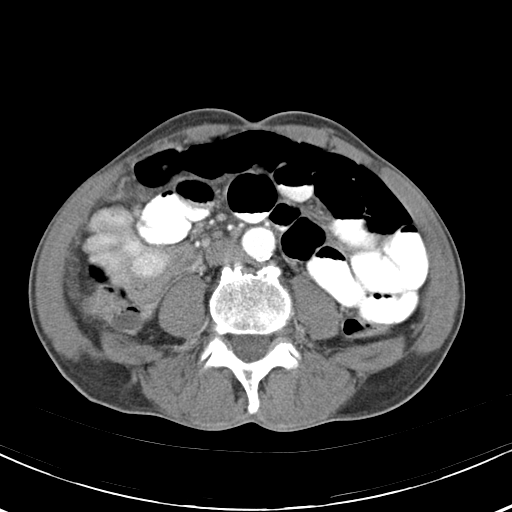
[im 58/90  soft-tissue]
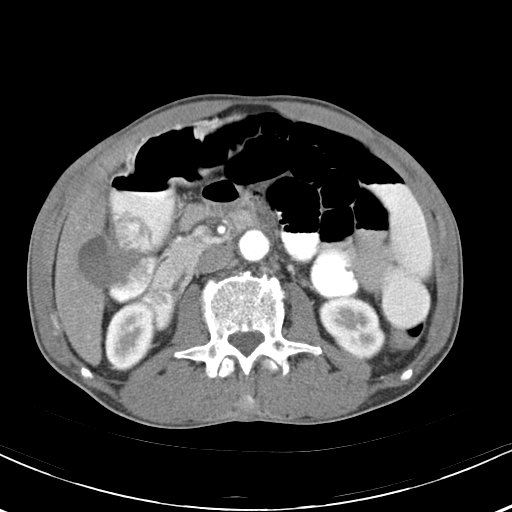
[im 58/90  bone]
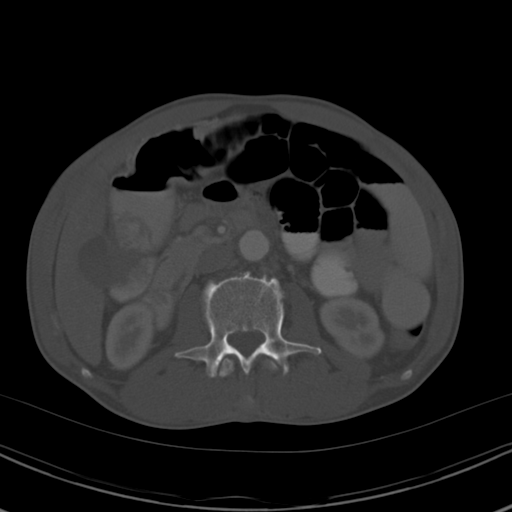
[im 63/90  soft-tissue]
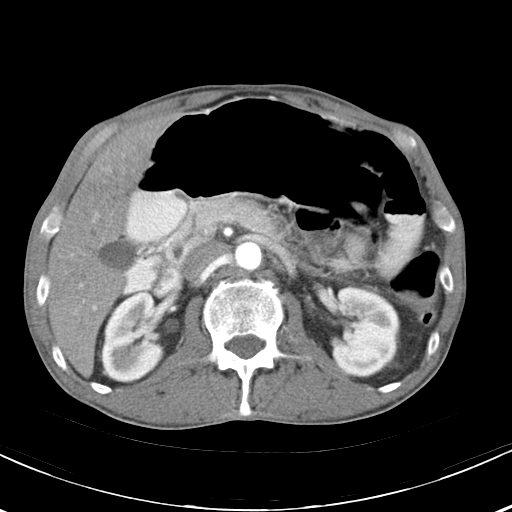
[im 72/90  soft-tissue]
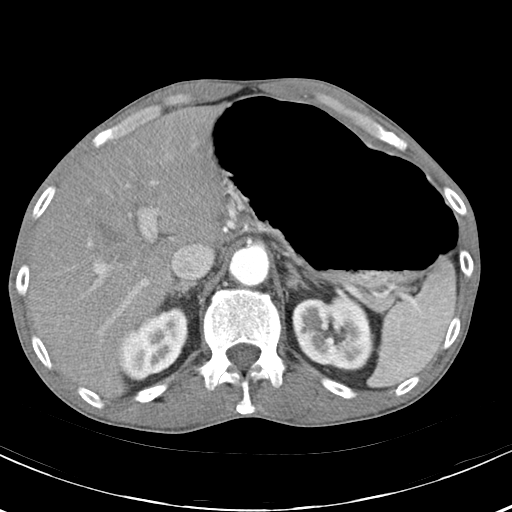
[im 76/90  soft-tissue]
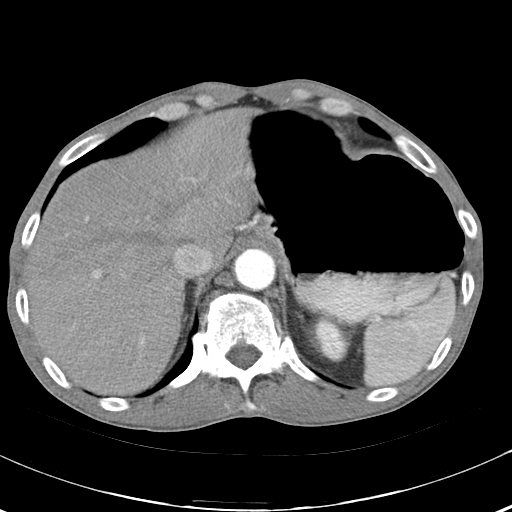
[im 85/90  soft-tissue]
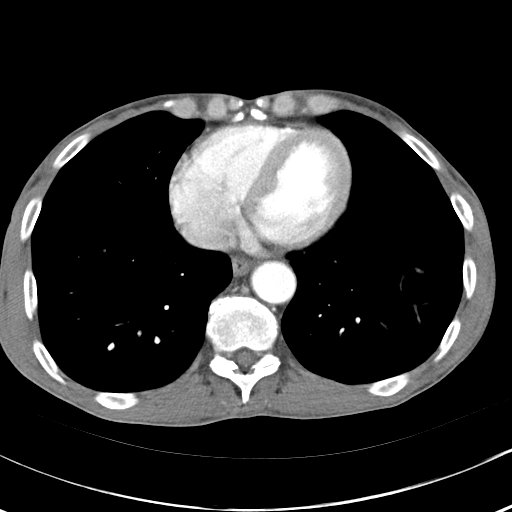

[Series 4: abd_pel_with 3.0 spo cor · coronal · 0.59mm/px · 3 of 71 slices shown]
[im 24/71  soft-tissue]
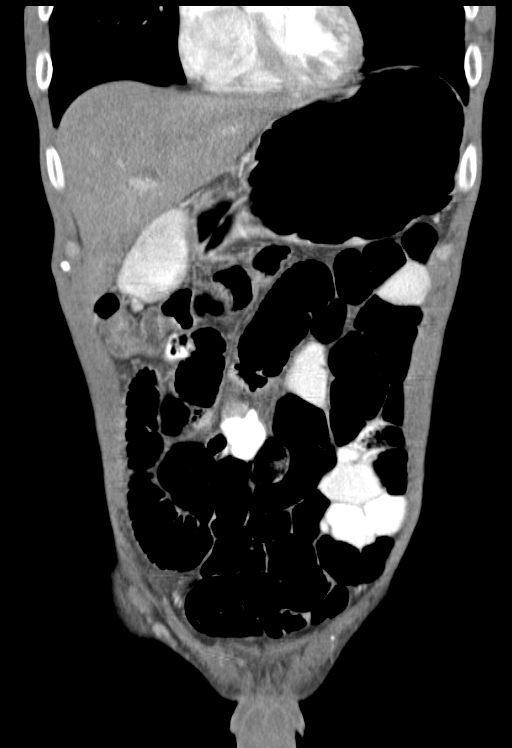
[im 32/71  soft-tissue]
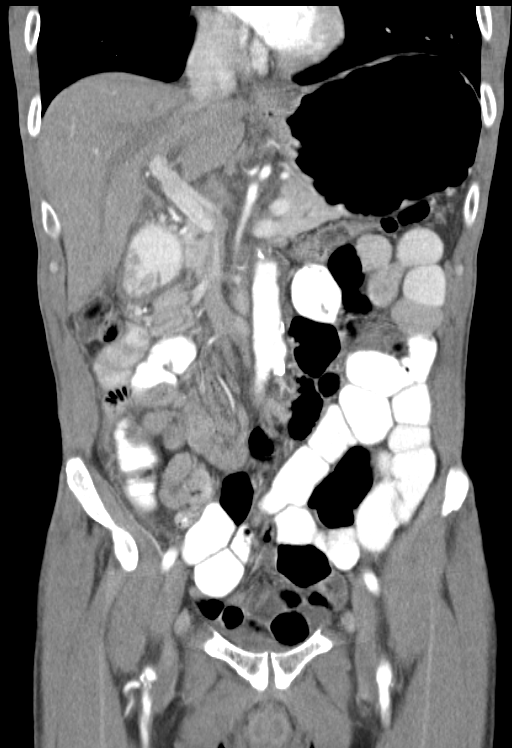
[im 39/71  soft-tissue]
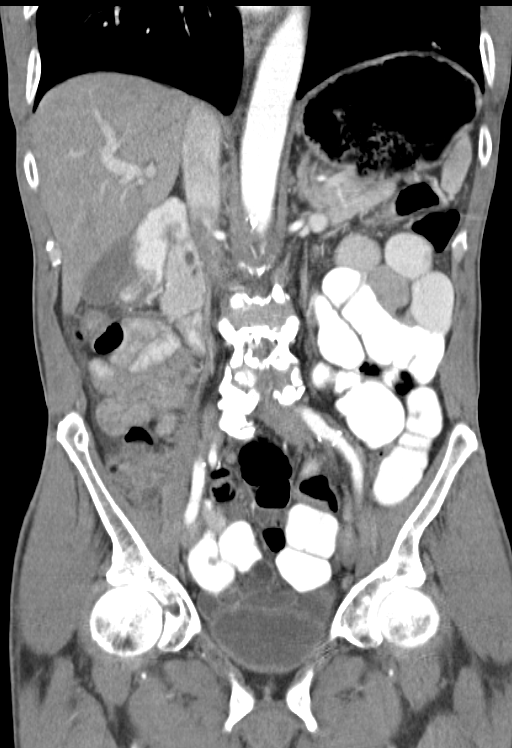

[16 of 46 positions shown; findings below may reference images not displayed]

FINDINGS: Minimal dependent atelectasis right lower lobe.
Liver, spleen, pancreas, kidneys, and adrenal glands normal.
Gaseous distention of the stomach.
Question minimal wall thickening versus underdistension at the
pylorus.
Scattered atherosclerotic calcifications aorta without aneurysm.
Bowel loops unremarkable.
No mass, adenopathy, free fluid, or free air.
Normal appendix.
Scattered endplate spur formation thoracic and lumbar spine.
IMPRESSION: Questionable bowel wall thickening of pylorus versus artifact from
underdistension.
No acute intra-abdominal or intrapelvic abnormalities.
Gaseous distention of stomach likely related to preceding upper
endoscopy.

## 2014-12-31 ENCOUNTER — Ambulatory Visit (INDEPENDENT_AMBULATORY_CARE_PROVIDER_SITE_OTHER): Payer: Medicare Other | Admitting: Family Medicine

## 2014-12-31 ENCOUNTER — Encounter: Payer: Self-pay | Admitting: Family Medicine

## 2014-12-31 VITALS — BP 116/72 | HR 64 | Temp 98.2°F | Resp 14 | Ht 67.0 in | Wt 138.0 lb

## 2014-12-31 DIAGNOSIS — Z720 Tobacco use: Secondary | ICD-10-CM

## 2014-12-31 DIAGNOSIS — N182 Chronic kidney disease, stage 2 (mild): Secondary | ICD-10-CM | POA: Diagnosis not present

## 2014-12-31 DIAGNOSIS — I10 Essential (primary) hypertension: Secondary | ICD-10-CM

## 2014-12-31 DIAGNOSIS — Z0000 Encounter for general adult medical examination without abnormal findings: Secondary | ICD-10-CM

## 2014-12-31 LAB — COMPREHENSIVE METABOLIC PANEL
ALBUMIN: 4.4 g/dL (ref 3.5–5.2)
ALT: 10 U/L (ref 0–53)
AST: 17 U/L (ref 0–37)
Alkaline Phosphatase: 64 U/L (ref 39–117)
BILIRUBIN TOTAL: 1 mg/dL (ref 0.2–1.2)
BUN: 20 mg/dL (ref 6–23)
CO2: 24 mEq/L (ref 19–32)
Calcium: 9.7 mg/dL (ref 8.4–10.5)
Chloride: 100 mEq/L (ref 96–112)
Creat: 1.3 mg/dL (ref 0.50–1.35)
GLUCOSE: 75 mg/dL (ref 70–99)
Potassium: 4.8 mEq/L (ref 3.5–5.3)
Sodium: 134 mEq/L — ABNORMAL LOW (ref 135–145)
Total Protein: 6.9 g/dL (ref 6.0–8.3)

## 2014-12-31 LAB — CBC WITH DIFFERENTIAL/PLATELET
Basophils Absolute: 0.1 10*3/uL (ref 0.0–0.1)
Basophils Relative: 1 % (ref 0–1)
Eosinophils Absolute: 0.2 10*3/uL (ref 0.0–0.7)
Eosinophils Relative: 3 % (ref 0–5)
HCT: 40.8 % (ref 39.0–52.0)
Hemoglobin: 13.4 g/dL (ref 13.0–17.0)
LYMPHS ABS: 1.5 10*3/uL (ref 0.7–4.0)
Lymphocytes Relative: 18 % (ref 12–46)
MCH: 32.4 pg (ref 26.0–34.0)
MCHC: 32.8 g/dL (ref 30.0–36.0)
MCV: 98.8 fL (ref 78.0–100.0)
MPV: 8.6 fL (ref 8.6–12.4)
Monocytes Absolute: 1 10*3/uL (ref 0.1–1.0)
Monocytes Relative: 12 % (ref 3–12)
NEUTROS ABS: 5.3 10*3/uL (ref 1.7–7.7)
Neutrophils Relative %: 66 % (ref 43–77)
Platelets: 254 10*3/uL (ref 150–400)
RBC: 4.13 MIL/uL — ABNORMAL LOW (ref 4.22–5.81)
RDW: 13 % (ref 11.5–15.5)
WBC: 8.1 10*3/uL (ref 4.0–10.5)

## 2014-12-31 LAB — LIPID PANEL
CHOL/HDL RATIO: 2.8 ratio
CHOLESTEROL: 182 mg/dL (ref 0–200)
HDL: 64 mg/dL (ref >39)
LDL Cholesterol: 97 mg/dL (ref 0–99)
TRIGLYCERIDES: 106 mg/dL (ref <150)
VLDL: 21 mg/dL (ref 0–40)

## 2014-12-31 MED ORDER — LORATADINE 10 MG PO TABS: 10.0000 mg | ORAL_TABLET | Freq: Every day | ORAL | Status: DC

## 2014-12-31 MED ORDER — HYDROCHLOROTHIAZIDE 25 MG PO TABS: 25.0000 mg | ORAL_TABLET | Freq: Every day | ORAL | Status: DC

## 2014-12-31 MED ORDER — TERAZOSIN HCL 1 MG PO CAPS: 1.0000 mg | ORAL_CAPSULE | Freq: Every day | ORAL | Status: DC

## 2014-12-31 NOTE — Assessment & Plan Note (Signed)
Blood pressure is well-controlled the change her medication

## 2014-12-31 NOTE — Assessment & Plan Note (Signed)
Continue to work on tobacco cessation.  

## 2014-12-31 NOTE — Progress Notes (Signed)
Patient ID: Eddie [name], male   DOB: 1946/08/12, 69 y.o.   MRN: [id number]   Subjective:    Patient ID: Eddie [name], male    DOB: March 20, 1946, 69 y.o.   MRN: [id number]  Patient presents for 6 month F/U  patient here to follow-up chronic medical problems. He has no specific concerns. He is taking his medication as prescribed He is being followed by oncology as well as GI His appetite has been good he has not had any dysphasia  Review Of Systems:  GEN- denies fatigue, fever, weight loss,weakness, recent illness HEENT- denies eye drainage, change in vision, nasal discharge, CVS- denies chest pain, palpitations RESP- denies SOB, cough, wheeze ABD- denies N/V, change in stools, abd pain GU- denies dysuria, hematuria, dribbling, incontinence MSK- denies joint pain, muscle aches, injury Neuro- denies headache, dizziness, syncope, seizure activity       Objective:    BP 116/72 mmHg  Pulse 64  Temp(Src) 98.2 F (36.8 C) (Oral)  Resp 14  Ht 5\' 7"  (1.702 m)  Wt 138 lb (62.596 kg)  BMI 21.61 kg/m2 GEN- NAD, alert and oriented x3 HEENT- PERRL, EOMI, non injected sclera, pink conjunctiva, MMM, oropharynx clear Neck- Supple, no LAD CVS- RRR, no murmur RESP-CTAB EXT- No edema Pulses- Radial 2+        Assessment & Plan:      Problem List Items Addressed This Visit      Unprioritized   Essential hypertension, benign   Relevant Orders   Lipid panel   CKD (chronic kidney disease), stage II - Primary   Relevant Orders   CBC with Differential/Platelet   Comprehensive metabolic panel      Note: This dictation was prepared with Dragon dictation along with smaller phrase technology. Any transcriptional errors that result from this process are unintentional.

## 2014-12-31 NOTE — Assessment & Plan Note (Signed)
Recheck renal function. ?

## 2014-12-31 NOTE — Patient Instructions (Signed)
[facility] We will send letter with lab results F/U 6 months

## 2015-01-02 ENCOUNTER — Encounter: Payer: Self-pay | Admitting: *Deleted

## 2015-01-09 ENCOUNTER — Encounter (HOSPITAL_COMMUNITY): Payer: Medicare Other | Attending: Hematology & Oncology | Admitting: Hematology & Oncology

## 2015-01-09 ENCOUNTER — Encounter (HOSPITAL_COMMUNITY): Payer: Self-pay | Admitting: Hematology & Oncology

## 2015-01-09 VITALS — BP 155/85 | HR 77 | Temp 98.6°F | Resp 18 | Wt 136.5 lb

## 2015-01-09 DIAGNOSIS — C153 Malignant neoplasm of upper third of esophagus: Secondary | ICD-10-CM

## 2015-01-09 DIAGNOSIS — C159 Malignant neoplasm of esophagus, unspecified: Secondary | ICD-10-CM

## 2015-01-09 NOTE — Patient Instructions (Signed)
[facility] Discharge Instructions  RECOMMENDATIONS MADE BY THE CONSULTANT AND ANY TEST RESULTS WILL BE SENT TO YOUR REFERRING PHYSICIAN.  SPECIAL INSTRUCTIONS/FOLLOW-UP:  Please call prior to your next visit with any problems or concerns. We will see you back again in FOUR months with labs and an office visit.  Thank you for choosing [facility] to provide your oncology and hematology care.  To afford each patient quality time with our providers, please arrive at least 15 minutes before your scheduled appointment time.  With your help, our goal is to use those 15 minutes to complete the necessary work-up to ensure our physicians have the information they need to help with your evaluation and healthcare recommendations.    Effective January 1st, 2014, we ask that you re-schedule your appointment with our physicians should you arrive 10 or more minutes late for your appointment.  We strive to give you quality time with our providers, and arriving late affects you and other patients whose appointments are after yours.    Again, thank you for choosing [facility].  Our hope is that these requests will decrease the amount of time that you wait before being seen by our physicians.       _____________________________________________________________  Should you have questions after your visit to [facility], please contact our office at [phone] between the hours of 8:30 a.m. and 5:00 p.m.  Voicemails left after 4:30 p.m. will not be returned until the following business day.  For prescription refill requests, have your pharmacy contact our office with your prescription refill request.

## 2015-01-09 NOTE — Progress Notes (Signed)
Eddie Blackbird, MD [address] 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 (10/11/2012- 12/10/2012)  Dr. Laural Golden removed the patient's PEG tube on 07/15/2013.  CLINICAL DATA: Subsequent treatment strategy for squamous cell carcinoma of the esophagus. CT demonstrating asymmetric soft tissue thickening within the proximal esophagus.  EXAM: 06/05/2014 NUCLEAR MEDICINE PET SKULL BASE TO THIGH IMPRESSION: 1. No evidence of residual/recurrent disease, including within the area of soft tissue fullness in the upper esophagus detailed on prior CT. 2. No evidence of metastatic disease. Foci of hypermetabolism within the neck and chest, consistent with hypermetabolic "Brown" fat. This slightly decreases sensitivity for hypermetabolic nodes. 3. Incidental findings, including coronary artery atherosclerosis.   Electronically Signed  By: Abigail Miyamoto M.D.  On: 06/05/2014 12:32   CURRENT THERAPY: Observation  INTERVAL HISTORY: Eddie [name] 69 y.o. 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 He had evidence of positive nodes high in the paratracheal region and was not felt to be a surgical candidate by Dr. Servando Snare.   MEDICAL HISTORY: Past Medical History  Diagnosis Date  . Hypertension   . Chronic back pain   . GERD (gastroesophageal reflux disease)   . Squamous cell carcinoma Oct 2014    Esophogus  . Squamous cell carcinoma of esophagus 09/25/2012  . Severe malnutrition 10/26/2012  . Duodenal ulcer 09/2012  . Helicobacter pylori (H. pylori) infection 09/2012    has Essential hypertension, benign; Pulmonary emphysema; Tobacco use; Squamous cell carcinoma of esophagus; Edentulous; Duodenal ulcer;  Seasonal allergies; Adenoma of large intestine; Elevated fasting blood sugar; Routine general medical examination at a health care facility; CKD (chronic kidney disease), stage II; and Esophageal cancer on his problem list.     is allergic to motrin.  Eddie [name] does not currently have medications on file.  SURGICAL HISTORY: Past Surgical History  Procedure Laterality Date  . Peg tube placement  09/21/2012  . Esophagogastroduodenoscopy  09/21/2012    Procedure: ESOPHAGOGASTRODUODENOSCOPY (EGD);  Surgeon: Rogene Houston, MD;  [facility]: [facility];  Service: Endoscopy;  Laterality: N/A;  345  . Peg placement  09/21/2012    Procedure: PERCUTANEOUS ENDOSCOPIC GASTROSTOMY (PEG) PLACEMENT;  Surgeon: Rogene Houston, MD;  Location: [facility];  Service: Endoscopy;  Laterality: N/A;  . Portacath placement    . Removal of gastrostomy tube      august 2014  . Colonoscopy N/A 12/26/2013    Procedure: COLONOSCOPY;  Surgeon: Rogene Houston, MD;  Location: [facility];  Service: Endoscopy;  Laterality: N/A;  830    SOCIAL HISTORY: History   Social History  . Marital Status: Married    Spouse Name: N/A  . Number of Children: N/A  . Years of Education: N/A   Occupational History  . Not on file.   Social History Main Topics  . Smoking status: Current Every Day Smoker -- 1.00 packs/day for 50 years    Types: Cigarettes  . Smokeless tobacco: Never Used     Comment: 1/2 to 1 pack x50 yrs.  . Alcohol Use: 3.6 oz/week    6 Cans of beer per week     Comment: twice a week  . Drug Use: No  . Sexual Activity: Not on file   Other Topics Concern  . Not  on file   Social History Narrative    FAMILY HISTORY: Family History  Problem Relation Age of Onset  . Stroke Mother   . Hypertension Father   . Cancer Brother     Throat cancer  . Kidney disease Daughter     Review of Systems  Constitutional: Negative for fever, chills, weight loss and malaise/fatigue.  HENT: Negative for  congestion, hearing loss, nosebleeds, sore throat and tinnitus.   Eyes: Negative for blurred vision, double vision, pain and discharge.  Respiratory: Negative for cough, hemoptysis, sputum production, shortness of breath and wheezing.   Cardiovascular: Negative for chest pain, palpitations, claudication, leg swelling and PND.  Gastrointestinal: Negative for heartburn, nausea, vomiting, abdominal pain, diarrhea, constipation, blood in stool and melena.  Genitourinary: Negative for dysuria, urgency, frequency and hematuria.  Musculoskeletal: Negative for myalgias, joint pain and falls.  Skin: Negative for itching and rash.  Neurological: Negative for dizziness, tingling, tremors, sensory change, speech change, focal weakness, seizures, loss of consciousness, weakness and headaches.  Endo/Heme/Allergies: Does not bruise/bleed easily.  Psychiatric/Behavioral: Negative for depression, suicidal ideas, memory loss and substance abuse. The patient is not nervous/anxious and does not have insomnia.     PHYSICAL EXAMINATION  ECOG PERFORMANCE STATUS: 0 - Asymptomatic  Filed Vitals:   01/09/15 1103  BP: 155/85  Pulse: 77  Temp: 98.6 F (37 C)  Resp: 18    Physical Exam  Constitutional: He is oriented to person, place, and time and well-developed, well-nourished, and in no distress.  Thin, weight stable, well groomed  HENT:  Head: Normocephalic and atraumatic.  Nose: Nose normal.  Mouth/Throat: Oropharynx is clear and moist. No oropharyngeal exudate.  Eyes: Conjunctivae and EOM are normal. Pupils are equal, round, and reactive to light. Right eye exhibits no discharge. Left eye exhibits no discharge. No scleral icterus.  Neck: Normal range of motion. Neck supple. No tracheal deviation present. No thyromegaly present.  Cardiovascular: Normal rate, regular rhythm and normal heart sounds.  Exam reveals no gallop and no friction rub.   No murmur heard. Pulmonary/Chest: Effort normal and breath  sounds normal. He has no wheezes. He has no rales.  Abdominal: Soft. Bowel sounds are normal. He exhibits no distension and no mass. There is no tenderness. There is no rebound and no guarding.  Musculoskeletal: Normal range of motion. He exhibits no edema.  Lymphadenopathy:    He has no cervical adenopathy.  Neurological: He is alert and oriented to person, place, and time. He has normal reflexes. No cranial nerve deficit. Gait normal. Coordination normal.  Skin: Skin is warm and dry. No rash noted.  Psychiatric: Mood, memory, affect and judgment normal.  Nursing note and vitals reviewed.   LABORATORY DATA:  CBC    Component Value Date/Time   WBC 8.1 12/31/2014 0819   RBC 4.13* 12/31/2014 0819   HGB 13.4 12/31/2014 0819   HCT 40.8 12/31/2014 0819   PLT 254 12/31/2014 0819   MCV 98.8 12/31/2014 0819   MCH 32.4 12/31/2014 0819   MCHC 32.8 12/31/2014 0819   RDW 13.0 12/31/2014 0819   LYMPHSABS 1.5 12/31/2014 0819   MONOABS 1.0 12/31/2014 0819   EOSABS 0.2 12/31/2014 0819   BASOSABS 0.1 12/31/2014 0819   CMP     Component Value Date/Time   NA 134* 12/31/2014 0819   K 4.8 12/31/2014 0819   CL 100 12/31/2014 0819   CO2 24 12/31/2014 0819   GLUCOSE 75 12/31/2014 0819   BUN 20 12/31/2014 0819  CREATININE 1.30 12/31/2014 0819   CREATININE 1.37* 05/29/2014 0929   CALCIUM 9.7 12/31/2014 0819   PROT 6.9 12/31/2014 0819   ALBUMIN 4.4 12/31/2014 0819   AST 17 12/31/2014 0819   ALT 10 12/31/2014 0819   ALKPHOS 64 12/31/2014 0819   BILITOT 1.0 12/31/2014 0819   GFRNONAA 52* 05/29/2014 0929   GFRNONAA 51* 04/23/2014 1050   GFRAA 61* 05/29/2014 0929   GFRAA 59* 04/23/2014 1050       ASSESSMENT and THERAPY PLAN:    Squamous cell carcinoma of esophagus Pleasant 69 year old male with a history of 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 He was not a surgical candidate. He completed concurrent therapy. He remains without evidence of disease. And states he is overall  doing well. He has no difficulty swallowing. His appetite is excellent. Per NCCN guidelines, we will continue to see the patient every 3 months for a total of 2 years followed by every 6 months for a total of 3 years (5 years total), then annually. Imaging studies and EGD +/- dilation as clinically indicated.  Early there is no indication for additional imaging. We will plan on seeing him back again in accordance with guidelines. We will get copies of his blood work from his outside physician and he is agreed to sign a release for [place] today. He knows to call prior to his next follow-up with any problems or concerns.   All questions were answered. The patient knows to call the clinic with any problems, questions or concerns. We can certainly see the patient much sooner if necessary. Molli Hazard 01/22/2015

## 2015-01-22 NOTE — Assessment & Plan Note (Signed)
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 He was not a surgical candidate. He completed concurrent therapy. He remains without evidence of disease. And states he is overall doing well. He has no difficulty swallowing. His appetite is excellent. Per NCCN guidelines, we will continue to see the patient every 3 months for a total of 2 years followed by every 6 months for a total of 3 years (5 years total), then annually. Imaging studies and EGD +/- dilation as clinically indicated.  Early there is no indication for additional imaging. We will plan on seeing him back again in accordance with guidelines. We will get copies of his blood work from his outside physician and he is agreed to sign a release for [place] today. He knows to call prior to his next follow-up with any problems or concerns.

## 2015-04-06 ENCOUNTER — Other Ambulatory Visit: Payer: Self-pay | Admitting: Family Medicine

## 2015-04-08 NOTE — Telephone Encounter (Signed)
Refill appropriate and filled per protocol. 

## 2015-04-21 ENCOUNTER — Encounter (INDEPENDENT_AMBULATORY_CARE_PROVIDER_SITE_OTHER): Payer: Self-pay | Admitting: *Deleted

## 2015-05-08 ENCOUNTER — Ambulatory Visit (HOSPITAL_COMMUNITY): Payer: Self-pay | Admitting: Hematology & Oncology

## 2015-05-19 ENCOUNTER — Encounter (HOSPITAL_COMMUNITY): Payer: Medicare Other | Attending: Hematology & Oncology | Admitting: Hematology & Oncology

## 2015-05-19 ENCOUNTER — Encounter (HOSPITAL_COMMUNITY): Payer: Self-pay | Admitting: Hematology & Oncology

## 2015-05-19 VITALS — BP 148/93 | HR 91 | Temp 98.5°F | Resp 16 | Wt 135.7 lb

## 2015-05-19 DIAGNOSIS — Z720 Tobacco use: Secondary | ICD-10-CM | POA: Diagnosis not present

## 2015-05-19 DIAGNOSIS — C153 Malignant neoplasm of upper third of esophagus: Secondary | ICD-10-CM

## 2015-05-19 DIAGNOSIS — N189 Chronic kidney disease, unspecified: Secondary | ICD-10-CM | POA: Diagnosis not present

## 2015-05-19 DIAGNOSIS — C159 Malignant neoplasm of esophagus, unspecified: Secondary | ICD-10-CM

## 2015-05-19 NOTE — Patient Instructions (Signed)
..  [place] at [facility] Discharge Instructions  RECOMMENDATIONS MADE BY THE CONSULTANT AND ANY TEST RESULTS WILL BE SENT TO YOUR REFERRING PHYSICIAN.  You will return to the cancer center in 6 months for follow up.  Please see the scheduler on your way out to get your appointment.    Thank you for choosing [place] at [facility] to provide your oncology and hematology care.  To afford each patient quality time with our provider, please arrive at least 15 minutes before your scheduled appointment time.    You need to re-schedule your appointment should you arrive 10 or more minutes late.  We strive to give you quality time with our providers, and arriving late affects you and other patients whose appointments are after yours.  Also, if you no show three or more times for appointments you may be dismissed from the clinic at the providers discretion.     Again, thank you for choosing [facility].  Our hope is that these requests will decrease the amount of time that you wait before being seen by our physicians.       _____________________________________________________________  Should you have questions after your visit to [facility], please contact our office at [phone] between the hours of 8:30 a.m. and 4:30 p.m.  Voicemails left after 4:30 p.m. will not be returned until the following business day.  For prescription refill requests, have your pharmacy contact our office.

## 2015-05-19 NOTE — Progress Notes (Signed)
Eddie Blackbird, MD [address] 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 (10/11/2012- 12/10/2012)  Dr. Laural Golden removed the patient's PEG tube on 07/15/2013.  CLINICAL DATA: Subsequent treatment strategy for squamous cell carcinoma of the esophagus. CT demonstrating asymmetric soft tissue thickening within the proximal esophagus.  EXAM: 06/05/2014 NUCLEAR MEDICINE PET SKULL BASE TO THIGH IMPRESSION: 1. No evidence of residual/recurrent disease, including within the area of soft tissue fullness in the upper esophagus detailed on prior CT. 2. No evidence of metastatic disease. Foci of hypermetabolism within the neck and chest, consistent with hypermetabolic "Brown" fat. This slightly decreases sensitivity for hypermetabolic nodes. 3. Incidental findings, including coronary artery atherosclerosis.   Electronically Signed  By: Abigail Miyamoto M.D.  On: 06/05/2014 12:32   CURRENT THERAPY: Observation  INTERVAL HISTORY: Eddie [name] 69 y.o. 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 He had evidence of positive nodes high in the paratracheal region and was not felt to be a surgical candidate by Dr. Servando Snare.   He is here alone today and feeling well. He has been eating well without any choking. He is able to eat pretty much anything he wants to. He has been sleeping well and walking frequently. His bowels are good. He overall has no complaints. His weight is stable.  MEDICAL HISTORY: Past Medical History  Diagnosis Date  . Hypertension   . Chronic back pain   . GERD (gastroesophageal reflux disease)   . Squamous cell carcinoma Oct 2014    Esophogus  . Squamous cell carcinoma of esophagus 09/25/2012    . Severe malnutrition 10/26/2012  . Duodenal ulcer 09/2012  . Helicobacter pylori (H. pylori) infection 09/2012    has Essential hypertension, benign; Pulmonary emphysema; Tobacco use; Squamous cell carcinoma of esophagus; Edentulous; Duodenal ulcer; Seasonal allergies; Adenoma of large intestine; Elevated fasting blood sugar; Routine general medical examination at a health care facility; CKD (chronic kidney disease), stage II; and Esophageal cancer on his problem list.     is allergic to motrin.  Eddie [name] does not currently have medications on file.  SURGICAL HISTORY: Past Surgical History  Procedure Laterality Date  . Peg tube placement  09/21/2012  . Esophagogastroduodenoscopy  09/21/2012    Procedure: ESOPHAGOGASTRODUODENOSCOPY (EGD);  Surgeon: Rogene Houston, MD;  [facility]: [facility];  Service: Endoscopy;  Laterality: N/A;  345  . Peg placement  09/21/2012    Procedure: PERCUTANEOUS ENDOSCOPIC GASTROSTOMY (PEG) PLACEMENT;  Surgeon: Rogene Houston, MD;  [facility];  Service: Endoscopy;  Laterality: N/A;  . Portacath placement    . Removal of gastrostomy tube      august 2014  . Colonoscopy N/A 12/26/2013    Procedure: COLONOSCOPY;  Surgeon: Rogene Houston, MD;  [facility];  Service: Endoscopy;  Laterality: N/A;  830    SOCIAL HISTORY: History   Social History  . Marital Status: Married    Spouse Name: N/A  . Number of Children: N/A  . Years of Education: N/A   Occupational History  . Not on file.   Social History Main Topics  . Smoking status: Current Every Day Smoker -- 1.00 packs/day for 50 years    Types: Cigarettes  . Smokeless tobacco: Never Used  Comment: 1/2 to 1 pack x50 yrs.  . Alcohol Use: 3.6 oz/week    6 Cans of beer per week     Comment: twice a week  . Drug Use: No  . Sexual Activity: Not on file   Other Topics Concern  . Not on file   Social History Narrative    FAMILY HISTORY: Family History  Problem  Relation Age of Onset  . Stroke Mother   . Hypertension Father   . Cancer Brother     Throat cancer  . Kidney disease Daughter     Review of Systems  Constitutional: Negative for fever, chills, weight loss and malaise/fatigue.  HENT: Negative for congestion, hearing loss, nosebleeds, sore throat and tinnitus.   Eyes: Negative for blurred vision, double vision, pain and discharge.  Respiratory: Negative for cough, hemoptysis, sputum production, shortness of breath and wheezing.   Cardiovascular: Negative for chest pain, palpitations, claudication, leg swelling and PND.  Gastrointestinal: Negative for heartburn, nausea, vomiting, abdominal pain, diarrhea, constipation, blood in stool and melena.  Genitourinary: Negative for dysuria, urgency, frequency and hematuria.  Musculoskeletal: Negative for myalgias, joint pain and falls.  Skin: Negative for itching and rash.  Neurological: Negative for dizziness, tingling, tremors, sensory change, speech change, focal weakness, seizures, loss of consciousness, weakness and headaches.  Endo/Heme/Allergies: Does not bruise/bleed easily.  Psychiatric/Behavioral: Negative for depression, suicidal ideas, memory loss and substance abuse. The patient is not nervous/anxious and does not have insomnia.    PHYSICAL EXAMINATION  ECOG PERFORMANCE STATUS: 0 - Asymptomatic  Filed Vitals:   05/19/15 1118  BP: 148/93  Pulse: 91  Temp: 98.5 F (36.9 C)  Resp: 16    Physical Exam  Constitutional: He is oriented to person, place, and time and well-developed, well-nourished, and in no distress.  Thin, weight stable, well groomed. Dentures top and bottom.  HENT:  Head: Normocephalic and atraumatic.  Nose: Nose normal.  Mouth/Throat: Oropharynx is clear and moist. No oropharyngeal exudate.  Eyes: Conjunctivae and EOM are normal. Pupils are equal, round, and reactive to light. Right eye exhibits no discharge. Left eye exhibits no discharge. No scleral  icterus.  Neck: Normal range of motion. Neck supple. No tracheal deviation present. No thyromegaly present.  Cardiovascular: Normal rate, regular rhythm and normal heart sounds.  Exam reveals no gallop and no friction rub.   No murmur heard. Pulmonary/Chest: Effort normal and breath sounds normal. He has no wheezes. He has no rales.  Abdominal: Soft. Bowel sounds are normal. He exhibits no distension and no mass. There is no tenderness. There is no rebound and no guarding.  Musculoskeletal: Normal range of motion. He exhibits no edema.  Lymphadenopathy:    He has no cervical adenopathy.  Neurological: He is alert and oriented to person, place, and time. He has normal reflexes. No cranial nerve deficit. Gait normal. Coordination normal.  Skin: Skin is warm and dry. No rash noted.  Psychiatric: Mood, memory, affect and judgment normal.  Nursing note and vitals reviewed.   LABORATORY DATA:  CBC    Component Value Date/Time   WBC 8.1 12/31/2014 0819   RBC 4.13* 12/31/2014 0819   HGB 13.4 12/31/2014 0819   HCT 40.8 12/31/2014 0819   PLT 254 12/31/2014 0819   MCV 98.8 12/31/2014 0819   MCH 32.4 12/31/2014 0819   MCHC 32.8 12/31/2014 0819   RDW 13.0 12/31/2014 0819   LYMPHSABS 1.5 12/31/2014 0819   MONOABS 1.0 12/31/2014 0819   EOSABS 0.2 12/31/2014 0819  BASOSABS 0.1 12/31/2014 0819   CMP     Component Value Date/Time   NA 134* 12/31/2014 0819   K 4.8 12/31/2014 0819   CL 100 12/31/2014 0819   CO2 24 12/31/2014 0819   GLUCOSE 75 12/31/2014 0819   BUN 20 12/31/2014 0819   CREATININE 1.30 12/31/2014 0819   CREATININE 1.37* 05/29/2014 0929   CALCIUM 9.7 12/31/2014 0819   PROT 6.9 12/31/2014 0819   ALBUMIN 4.4 12/31/2014 0819   AST 17 12/31/2014 0819   ALT 10 12/31/2014 0819   ALKPHOS 64 12/31/2014 0819   BILITOT 1.0 12/31/2014 0819   GFRNONAA 52* 05/29/2014 0929   GFRNONAA 51* 04/23/2014 1050   GFRAA 61* 05/29/2014 0929   GFRAA 59* 04/23/2014 1050     ASSESSMENT and  THERAPY PLAN:  Surveillance after combined modality therapy for 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 (10/11/2012- 12/10/2012) CKD Tobacco Use  He has lab work done with his primary care provider and wishes for [place] to get his labs from there. He is doing well without obvious recurrence. I reviewed signs and symptoms of concern such as weight loss, loss of appetite or energy level, new pain and advised him to call for earlier follow-up if he develops any of these symptoms. He will follow up in 6 months.  No imaging is currently indicated.  He was encouraging to discontinue smoking and offered several methods for cessation.  I discussed the risks of ongoing tobacco use including malignancy, heart disease. He is currently not interested in pursuing smoking cessation.  All questions were answered. The patient knows to call the clinic with any problems, questions or concerns. We can certainly see the patient much sooner if necessary.  This document serves as a record of services personally performed by Ancil Linsey, MD. It was created on her behalf by Arlyce Harman, a trained medical scribe. The creation of this record is based on the scribe's personal observations and the provider's statements to them. This document has been checked and approved by the attending provider.  I have reviewed the above documentation for accuracy and completeness, and I agree with the above.  Molli Hazard MD 05/19/2015

## 2015-06-25 ENCOUNTER — Encounter (INDEPENDENT_AMBULATORY_CARE_PROVIDER_SITE_OTHER): Payer: Self-pay | Admitting: *Deleted

## 2015-07-03 ENCOUNTER — Ambulatory Visit: Payer: Medicare Other | Admitting: Family Medicine

## 2015-07-06 ENCOUNTER — Other Ambulatory Visit: Payer: Self-pay | Admitting: Family Medicine

## 2015-07-06 NOTE — Telephone Encounter (Signed)
Refill appropriate and filled per protocol. 

## 2015-07-15 ENCOUNTER — Encounter (INDEPENDENT_AMBULATORY_CARE_PROVIDER_SITE_OTHER): Payer: Self-pay | Admitting: Internal Medicine

## 2015-07-15 ENCOUNTER — Ambulatory Visit (INDEPENDENT_AMBULATORY_CARE_PROVIDER_SITE_OTHER): Payer: Medicare Other | Admitting: Internal Medicine

## 2015-07-15 VITALS — BP 142/68 | HR 64 | Temp 98.0°F | Ht 67.0 in | Wt 135.2 lb

## 2015-07-15 DIAGNOSIS — C159 Malignant neoplasm of esophagus, unspecified: Secondary | ICD-10-CM

## 2015-07-15 NOTE — Progress Notes (Signed)
Subjective:    Patient ID: Eddie [name], male    DOB: [birth year], 69 y.o.   MRN: [id number]  HPI Here today for follow up.  Hx of squamous cell carcinoma of the esophagus, diagnosed in October of 2013.  Treatment completed in January of 2014 utilizing continuous infusion 5-fluorouracil in conjunction with external beam radiotherapy. He tells me he is doing good. He has gained 3 pounds since his last visit in August of 2015. He is eating what he wants. No acid reflux. No dysphagia. He can eat anything he wants.  He does have a dry cough. No chest pain.  He usually has a BM once a day and sometimes twice a day.  He continues to smoke 1/2 pack a day for at least 50 yrs. He continues to drink beer daily. Usually drinks a 24 oz beer a day or a little more somedays.  Recently saw Dr. Whitney Muse in June of this year for f/u.   05/27/2014 PET Scan: emphysema. Left common iliac dilatation.  IMPRESSION:  1. No evidence of residual/recurrent disease, including within the  area of soft tissue fullness in the upper esophagus detailed on  prior CT.  2. No evidence of metastatic disease. Foci of hypermetabolism within  the neck and chest, consistent with hypermetabolic "Brown" fat. This  slightly decreases sensitivity for hypermetabolic nodes.  3. Incidental findings, including coronary artery atherosclerosis.   12/26/2013 Colonoscopy: Dr. Laural Golden. Routine screening: Impression:  Examination performed to cecum.  8 mm pedunculated polyp snared from distal sigmoid colon.  Small external hemorrhoids.   Notes Recorded by Rogene Houston, MD on 12/29/2013 at 7:24 PM Polyp is a tubular adenoma. Size 8 mm. Results reviewed with patient Next TCS in 5 years  Review of Systems Past Medical History  Diagnosis Date  . Hypertension   . Chronic back pain   . GERD (gastroesophageal reflux disease)   . Squamous cell carcinoma Oct 2014    Esophogus  . Squamous cell carcinoma of esophagus  09/25/2012  . Severe malnutrition 10/26/2012  . Duodenal ulcer 09/2012  . Helicobacter pylori (H. pylori) infection 09/2012    Past Surgical History  Procedure Laterality Date  . Peg tube placement  09/21/2012  . Esophagogastroduodenoscopy  09/21/2012    Procedure: ESOPHAGOGASTRODUODENOSCOPY (EGD);  Surgeon: Rogene Houston, MD;  [facility];  Service: Endoscopy;  Laterality: N/A;  345  . Peg placement  09/21/2012    Procedure: PERCUTANEOUS ENDOSCOPIC GASTROSTOMY (PEG) PLACEMENT;  Surgeon: Rogene Houston, MD;  [facility];  Service: Endoscopy;  Laterality: N/A;  . Portacath placement    . Removal of gastrostomy tube      august 2014  . Colonoscopy N/A 12/26/2013    Procedure: COLONOSCOPY;  Surgeon: Rogene Houston, MD;  [facility];  Service: Endoscopy;  Laterality: N/A;  830    Allergies  Allergen Reactions  . Motrin [Ibuprofen] Swelling    Current Outpatient Prescriptions on File Prior to Visit  Medication Sig Dispense Refill  . Cyanocobalamin (VITAMIN B-12 PO) Take 1 tablet by mouth daily.    . hydrochlorothiazide (HYDRODIURIL) 25 MG tablet Take 1 tablet (25 mg total) by mouth daily. 30 tablet 11  . lisinopril (PRINIVIL,ZESTRIL) 10 MG tablet TAKE ONE TABLET BY MOUTH ONCE DAILY. 30 tablet 6  . loratadine (CLARITIN) 10 MG tablet TAKE ONE TABLET BY MOUTH DAILY. 30 tablet 11  . terazosin (HYTRIN) 1 MG capsule Take 1 capsule (1 mg total) by mouth  at bedtime. 30 capsule 11   No current facility-administered medications on file prior to visit.        Objective:   Physical Exam Blood pressure 142/68, pulse 64, temperature 98 F (36.7 C), height 5\' 7"  (1.702 m), weight 135 lb 3.2 oz (61.326 kg). Alert and oriented. Skin warm and dry. Oral mucosa is moist.   . Sclera anicteric, conjunctivae is pink. Thyroid not enlarged. No cervical lymphadenopathy. Lungs clear. Heart regular rate and rhythm.  Abdomen is soft. Bowel sounds are positive. No  hepatomegaly. No abdominal masses felt. No tenderness.  No edema to lower extremities. Patient is alert and oriented.        Assessment & Plan:  Hx of esophageal cancer . He seems to be doing well.  He has no dysphagia. No acid reflux. He eats what he wants. OV in 1 yr.  Patient needs to quit smoking.

## 2015-07-15 NOTE — Patient Instructions (Signed)
OV in 1 yr. Try to quit smoking

## 2015-07-20 ENCOUNTER — Ambulatory Visit (INDEPENDENT_AMBULATORY_CARE_PROVIDER_SITE_OTHER): Payer: Self-pay | Admitting: Internal Medicine

## 2015-07-21 ENCOUNTER — Ambulatory Visit (INDEPENDENT_AMBULATORY_CARE_PROVIDER_SITE_OTHER): Payer: Medicare Other | Admitting: Family Medicine

## 2015-07-21 ENCOUNTER — Encounter: Payer: Self-pay | Admitting: Family Medicine

## 2015-07-21 VITALS — BP 136/70 | HR 78 | Temp 97.9°F | Resp 18 | Ht 67.0 in | Wt 134.0 lb

## 2015-07-21 DIAGNOSIS — Z0000 Encounter for general adult medical examination without abnormal findings: Secondary | ICD-10-CM

## 2015-07-21 DIAGNOSIS — I10 Essential (primary) hypertension: Secondary | ICD-10-CM | POA: Diagnosis not present

## 2015-07-21 DIAGNOSIS — N182 Chronic kidney disease, stage 2 (mild): Secondary | ICD-10-CM

## 2015-07-21 DIAGNOSIS — Z720 Tobacco use: Secondary | ICD-10-CM

## 2015-07-21 DIAGNOSIS — R062 Wheezing: Secondary | ICD-10-CM

## 2015-07-21 DIAGNOSIS — Z125 Encounter for screening for malignant neoplasm of prostate: Secondary | ICD-10-CM | POA: Diagnosis not present

## 2015-07-21 LAB — COMPREHENSIVE METABOLIC PANEL
ALBUMIN: 4.4 g/dL (ref 3.6–5.1)
ALT: 11 U/L (ref 9–46)
AST: 21 U/L (ref 10–35)
Alkaline Phosphatase: 66 U/L (ref 40–115)
BILIRUBIN TOTAL: 1.3 mg/dL — AB (ref 0.2–1.2)
BUN: 17 mg/dL (ref 7–25)
CALCIUM: 9.6 mg/dL (ref 8.6–10.3)
CO2: 23 mmol/L (ref 20–31)
Chloride: 94 mmol/L — ABNORMAL LOW (ref 98–110)
Creat: 1.28 mg/dL — ABNORMAL HIGH (ref 0.70–1.25)
Glucose, Bld: 88 mg/dL (ref 70–99)
Potassium: 4.5 mmol/L (ref 3.5–5.3)
Sodium: 127 mmol/L — ABNORMAL LOW (ref 135–146)
Total Protein: 7.3 g/dL (ref 6.1–8.1)

## 2015-07-21 LAB — CBC WITH DIFFERENTIAL/PLATELET
Basophils Absolute: 0.1 10*3/uL (ref 0.0–0.1)
Basophils Relative: 1 % (ref 0–1)
Eosinophils Absolute: 0.2 10*3/uL (ref 0.0–0.7)
Eosinophils Relative: 3 % (ref 0–5)
HEMATOCRIT: 44 % (ref 39.0–52.0)
HEMOGLOBIN: 15.2 g/dL (ref 13.0–17.0)
LYMPHS PCT: 15 % (ref 12–46)
Lymphs Abs: 1 10*3/uL (ref 0.7–4.0)
MCH: 33.9 pg (ref 26.0–34.0)
MCHC: 34.5 g/dL (ref 30.0–36.0)
MCV: 98 fL (ref 78.0–100.0)
MONO ABS: 0.8 10*3/uL (ref 0.1–1.0)
MONOS PCT: 12 % (ref 3–12)
MPV: 9 fL (ref 8.6–12.4)
NEUTROS ABS: 4.7 10*3/uL (ref 1.7–7.7)
Neutrophils Relative %: 69 % (ref 43–77)
Platelets: 217 10*3/uL (ref 150–400)
RBC: 4.49 MIL/uL (ref 4.22–5.81)
RDW: 12.1 % (ref 11.5–15.5)
WBC: 6.8 10*3/uL (ref 4.0–10.5)

## 2015-07-21 LAB — LIPID PANEL
CHOLESTEROL: 165 mg/dL (ref 125–200)
HDL: 70 mg/dL (ref >=40)
LDL CALC: 73 mg/dL (ref <130)
TRIGLYCERIDES: 109 mg/dL (ref <150)
Total CHOL/HDL Ratio: 2.4 Ratio (ref <=5.0)
VLDL: 22 mg/dL (ref <30)

## 2015-07-21 MED ORDER — VARENICLINE TARTRATE 0.5 MG X 11 & 1 MG X 42 PO MISC: ORAL | Status: DC

## 2015-07-21 MED ORDER — TERAZOSIN HCL 1 MG PO CAPS: 1.0000 mg | ORAL_CAPSULE | Freq: Every day | ORAL | Status: DC

## 2015-07-21 MED ORDER — HYDROCHLOROTHIAZIDE 25 MG PO TABS: 25.0000 mg | ORAL_TABLET | Freq: Every day | ORAL | Status: DC

## 2015-07-21 MED ORDER — LISINOPRIL 10 MG PO TABS: 10.0000 mg | ORAL_TABLET | Freq: Every day | ORAL | Status: DC

## 2015-07-21 MED ORDER — LORATADINE 10 MG PO TABS: 10.0000 mg | ORAL_TABLET | Freq: Every day | ORAL | Status: DC

## 2015-07-21 MED ORDER — ZOSTER VACCINE LIVE 19400 UNT/0.65ML ~~LOC~~ SOLR: 0.6500 mL | Freq: Once | SUBCUTANEOUS | Status: DC

## 2015-07-21 NOTE — Assessment & Plan Note (Signed)
Well controlled, no change to medications 

## 2015-07-21 NOTE — Progress Notes (Signed)
Patient ID: Eddie [name], male   DOB: 1946/07/30, 69 y.o.   MRN: [id number] Subjective:   Patient presents for Medicare Annual/Subsequent preventive examination.   Pt here for annual CPE, no concerns, reviewed last oncology note and GI note, history of SCC of the esophogus and continues to smoke. He does cough and wheeze at times he thought it was due to the air being on.  Review Past Medical/Family/Social: Per EMR   Risk Factors  Current exercise habits: -walks Dietary issues discussed: None   Cardiac risk factors: HTN, smoking   Depression Screen  (Note: if answer to either of the following is "Yes", a more complete depression screening is indicated)  Over the past two weeks, have you felt down, depressed or hopeless? No Over the past two weeks, have you felt little interest or pleasure in doing things? No Have you lost interest or pleasure in daily life? No Do you often feel hopeless? No Do you cry easily over simple problems? No   Activities of Daily Living  In your present state of health, do you have any difficulty performing the following activities?:  Driving? No  Managing money? No  Feeding yourself? No  Getting from bed to chair? No  Climbing a flight of stairs? No  Preparing food and eating?: No  Bathing or showering? No  Getting dressed: No  Getting to the toilet? No  Using the toilet:No  Moving around from place to place: No  In the past year have you fallen or had a near fall?:No  Are you sexually active? yes Do you have more than one partner? No   Hearing Difficulties: No  Do you often ask people to speak up or repeat themselves? No  Do you experience ringing or noises in your ears? No Do you have difficulty understanding soft or whispered voices? No  Do you feel that you have a problem with memory? No Do you often misplace items? No  Do you feel safe at home? Yes  Cognitive Testing  Alert? Yes Normal Appearance?Yes  Oriented to person? Yes Place? Yes   Time? Yes  Recall of three objects? Yes  Can perform simple calculations? Yes  Displays appropriate judgment?Yes  Can read the correct time from a watch face?Yes   List the Names of Other Physician/Practitioners you currently use:  Oncology, ENT , GI- Dr. Laural Golden  Screening Tests / Date Colonoscopy -UTD                    Zostavax - Due Influenza Vaccine - UTD Tetanus/tdap- financial unable  Pneumonia vaccines UTD  ROS: GEN- denies fatigue, fever, weight loss,weakness, recent illness HEENT- denies eye drainage, change in vision, nasal discharge, CVS- denies chest pain, palpitations RESP- denies SOB, cough, wheeze ABD- denies N/V, change in stools, abd pain GU- denies dysuria, hematuria, dribbling, incontinence MSK- denies joint pain, muscle aches, injury Neuro- denies headache, dizziness, syncope, seizure activity   PHYSICAL: GEN- NAD, alert and oriented x3 HEENT- PERRL, EOMI, non injected sclera, pink conjunctiva, MMM, oropharynx clear, TM clear bilat, no effusion  Neck- Supple, no thryomegaly, no LAD CVS- RRR, no murmur RESP-scattered wheeze, normal WOB, no rales ABD-NABS,soft,NT,ND Rectum- normal tone, soft stool in vaule, FOBT neg, enlarged prostate, no nodules  EXT- No edema Pulses- Radial 2+     Assessment:    Annual wellness medicare exam   Plan:    During the course of the visit the patient was educated and counseled about appropriate screening and  preventive services including:    Shingles vaccine. Prescription given to that she can get the vaccine at the pharmacy or Medicare part [facility] for depression.  Diet review for nutrition referral? Yes ____ Not Indicated __x__  Patient Instructions (the written plan) was given to the patient.  Medicare Attestation  I have personally reviewed:  The patient's medical and social history  Their use of alcohol, tobacco or illicit drugs  Their current medications and supplements  The patient's functional  ability including ADLs,fall risks, home safety risks, cognitive, and hearing and visual impairment  Diet and physical activities  Evidence for depression or mood disorders  The patient's weight, height, BMI, and visual acuity have been recorded in the chart. I have made referrals, counseling, and provided education to the patient based on review of the above and I have provided the patient with a written personalized care plan for preventive services.

## 2015-07-21 NOTE — Assessment & Plan Note (Signed)
Concern for COPD, he had imaging in the past that showed some emphysematous signs nothing noted on CT scan from 2015, set up for PFT, with his cough and wheeze

## 2015-07-21 NOTE — Patient Instructions (Addendum)
I recommend eye visit once a year I recommend dental visit every 6 months Goal is to  Exercise 30 minutes 5 days a week We will send a letter with lab results  Check into shingles vaccine at the pharmacy Start chantix for smoking PFT to be set up  F/u 6 MONTHS

## 2015-07-21 NOTE — Assessment & Plan Note (Signed)
counsled on cesssation he is willing to try Chantix, given information about the drug

## 2015-07-22 ENCOUNTER — Other Ambulatory Visit: Payer: Self-pay | Admitting: *Deleted

## 2015-07-22 DIAGNOSIS — E871 Hypo-osmolality and hyponatremia: Secondary | ICD-10-CM

## 2015-07-22 LAB — PSA, MEDICARE: PSA: 1.67 ng/mL (ref <=4.00)

## 2015-07-22 MED ORDER — LISINOPRIL 20 MG PO TABS: 20.0000 mg | ORAL_TABLET | Freq: Every day | ORAL | Status: DC

## 2015-07-31 ENCOUNTER — Other Ambulatory Visit: Payer: Self-pay | Admitting: *Deleted

## 2015-07-31 ENCOUNTER — Ambulatory Visit (HOSPITAL_COMMUNITY)
Admission: RE | Admit: 2015-07-31 | Discharge: 2015-07-31 | Disposition: A | Discharge status: Home / Self Care | Payer: Medicare Other | Source: Ambulatory Visit | Attending: Family Medicine | Admitting: Family Medicine

## 2015-07-31 DIAGNOSIS — J449 Chronic obstructive pulmonary disease, unspecified: Secondary | ICD-10-CM

## 2015-07-31 DIAGNOSIS — R05 Cough: Secondary | ICD-10-CM | POA: Insufficient documentation

## 2015-07-31 DIAGNOSIS — R062 Wheezing: Secondary | ICD-10-CM

## 2015-07-31 DIAGNOSIS — F17210 Nicotine dependence, cigarettes, uncomplicated: Secondary | ICD-10-CM | POA: Insufficient documentation

## 2015-07-31 DIAGNOSIS — R942 Abnormal results of pulmonary function studies: Secondary | ICD-10-CM

## 2015-07-31 DIAGNOSIS — Z720 Tobacco use: Secondary | ICD-10-CM

## 2015-07-31 LAB — PULMONARY FUNCTION TEST
DL/VA % pred: 66 %
DL/VA: 2.93 ml/min/mmHg/L
DLCO unc % pred: 44 %
DLCO unc: 12.48 ml/min/mmHg
FEF 25-75 Post: 2.38 L/sec
FEF 25-75 Pre: 1.85 L/sec
FEF2575-%Change-Post: 28 %
FEF2575-%Pred-Post: 102 %
FEF2575-%Pred-Pre: 79 %
FEV1-%CHANGE-POST: 7 %
FEV1-%PRED-POST: 104 %
FEV1-%PRED-PRE: 96 %
FEV1-PRE: 2.53 L
FEV1-Post: 2.72 L
FEV1FVC-%Change-Post: 1 %
FEV1FVC-%Pred-Pre: 95 %
FEV6-%Change-Post: 5 %
FEV6-%PRED-PRE: 104 %
FEV6-%Pred-Post: 109 %
FEV6-POST: 3.61 L
FEV6-Pre: 3.41 L
FEV6FVC-%Change-Post: 0 %
FEV6FVC-%PRED-POST: 104 %
FEV6FVC-%Pred-Pre: 104 %
FVC-%Change-Post: 5 %
FVC-%PRED-PRE: 99 %
FVC-%Pred-Post: 105 %
FVC-POST: 3.62 L
FVC-PRE: 3.43 L
POST FEV6/FVC RATIO: 100 %
PRE FEV1/FVC RATIO: 74 %
PRE FEV6/FVC RATIO: 100 %
Post FEV1/FVC ratio: 75 %
RV % PRED: 136 %
RV: 3.04 L
TLC % PRED: 91 %
TLC: 5.88 L

## 2015-07-31 MED ORDER — ALBUTEROL SULFATE (2.5 MG/3ML) 0.083% IN NEBU
2.5000 mg | INHALATION_SOLUTION | Freq: Once | RESPIRATORY_TRACT | Status: AC
  Administered 2015-07-31: 2.5 mg via RESPIRATORY_TRACT

## 2015-08-20 ENCOUNTER — Other Ambulatory Visit: Payer: Self-pay | Admitting: *Deleted

## 2015-08-20 MED ORDER — LISINOPRIL 20 MG PO TABS: 20.0000 mg | ORAL_TABLET | Freq: Every day | ORAL | Status: DC

## 2015-08-20 NOTE — Addendum Note (Signed)
Addended by: Sheral Flow on: 08/20/2015 09:43 AM   Modules accepted: Orders

## 2015-08-20 NOTE — Telephone Encounter (Signed)
Received fax requesting refill on lisinopril with 90 day supply.   Refill appropriate and filled per protocol.

## 2015-08-24 DIAGNOSIS — I10 Essential (primary) hypertension: Secondary | ICD-10-CM | POA: Diagnosis not present

## 2015-08-24 DIAGNOSIS — J449 Chronic obstructive pulmonary disease, unspecified: Secondary | ICD-10-CM | POA: Diagnosis not present

## 2015-09-03 ENCOUNTER — Other Ambulatory Visit: Payer: Self-pay | Admitting: Family Medicine

## 2015-09-10 ENCOUNTER — Ambulatory Visit: Payer: Medicare Other

## 2015-11-18 ENCOUNTER — Encounter (HOSPITAL_COMMUNITY): Payer: Medicare Other | Attending: Hematology & Oncology | Admitting: Hematology & Oncology

## 2015-11-18 ENCOUNTER — Encounter (HOSPITAL_COMMUNITY): Payer: Self-pay | Admitting: Hematology & Oncology

## 2015-11-18 VITALS — BP 144/75 | HR 79 | Temp 98.3°F | Resp 16 | Wt 133.9 lb

## 2015-11-18 DIAGNOSIS — C153 Malignant neoplasm of upper third of esophagus: Secondary | ICD-10-CM

## 2015-11-18 DIAGNOSIS — Z23 Encounter for immunization: Secondary | ICD-10-CM

## 2015-11-18 DIAGNOSIS — N189 Chronic kidney disease, unspecified: Secondary | ICD-10-CM

## 2015-11-18 DIAGNOSIS — Z0000 Encounter for general adult medical examination without abnormal findings: Secondary | ICD-10-CM

## 2015-11-18 DIAGNOSIS — Z720 Tobacco use: Secondary | ICD-10-CM | POA: Diagnosis not present

## 2015-11-18 DIAGNOSIS — C159 Malignant neoplasm of esophagus, unspecified: Secondary | ICD-10-CM

## 2015-11-18 MED ORDER — INFLUENZA VAC SPLIT QUAD 0.5 ML IM SUSY
0.5000 mL | PREFILLED_SYRINGE | Freq: Once | INTRAMUSCULAR | Status: AC
  Administered 2015-11-18: 0.5 mL via INTRAMUSCULAR
  Filled 2015-11-18: qty 0.5

## 2015-11-18 NOTE — Progress Notes (Signed)
[name] Given flu vaccine today

## 2015-11-18 NOTE — Progress Notes (Signed)
Eddie Blackbird, MD [address] [place] [address]    DIAGNOSIS: Surveillance after combined modality therapy for 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 (10/11/2012- 12/10/2012)  Dr. Laural Golden removed the patient's PEG tube on 07/15/2013.  CLINICAL DATA: Subsequent treatment strategy for squamous cell carcinoma of the esophagus. CT demonstrating asymmetric soft tissue thickening within the proximal esophagus.  EXAM: 06/05/2014 NUCLEAR MEDICINE PET SKULL BASE TO THIGH IMPRESSION: 1. No evidence of residual/recurrent disease, including within the area of soft tissue fullness in the upper esophagus detailed on prior CT. 2. No evidence of metastatic disease. Foci of hypermetabolism within the neck and chest, consistent with hypermetabolic "Brown" fat. This slightly decreases sensitivity for hypermetabolic nodes. 3. Incidental findings, including coronary artery atherosclerosis.   Electronically Signed  By: Abigail Miyamoto M.D.  On: 06/05/2014 12:32   CURRENT THERAPY: Observation  INTERVAL HISTORY: Eddie [name] 69 y.o. 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 He had evidence of positive nodes high in the paratracheal region and was not felt to be a surgical candidate by Dr. Servando Snare.   He is here alone today and feeling well. He has been eating well without any choking. He is able to eat pretty much anything he wants to. He has been sleeping well and walking frequently. His bowels are good. He overall has no complaints. His weight is stable.   He denies any problems today. He reports no issues swallowing, no choking on food; he eats what he wants, has a good appetite, and has no pain. He has no complaints. He denies any chest pain, and confirms that his stamina is still good.  He  says he is still smoking, but not as much as he was. He says he now smokes about a half a pack a day. With regards to quitting, he remarks that it's easier said than done. He was taking Chantix, prescribed by his PCP Dr. Buelah Manis. He says the Chantix really helped and cut him back a lot, but he was worried about side-effects, and quit taking it. He says he's been smoking all of his adult life. .  He states that he had a good Thanksgiving and plans to spend more time with his family this holiday season.  The last time he had a pneumonia shot was 3 years ago. He is good on his colonoscopy and denies any bowel problems. He sees his PCP, Dr. Buelah Manis, "whenever she sets the appointment."  He would like his scans after new Year's.  MEDICAL HISTORY: Past Medical History  Diagnosis Date  . Hypertension   . Chronic back pain   . GERD (gastroesophageal reflux disease)   . Squamous cell carcinoma [facility]) Oct 2014    Esophogus  . Squamous cell carcinoma of esophagus ([place]) 09/25/2012  . Severe malnutrition ([place]) 10/26/2012  . Duodenal ulcer 09/2012  . Helicobacter pylori (H. pylori) infection 09/2012    has Essential hypertension, benign; Pulmonary emphysema ([place]); Tobacco use; Squamous cell carcinoma of esophagus ([place]); Edentulous; Duodenal ulcer; Seasonal allergies; Adenoma of large intestine; Routine general medical examination at a health care facility; CKD (chronic kidney disease), stage II; Esophageal cancer ([place]); and Wheezing on his problem list.     is allergic to motrin.  Mr. Dilone does not currently have medications on file.  SURGICAL HISTORY: Past Surgical History  Procedure Laterality Date  .  Peg tube placement  09/21/2012  . Esophagogastroduodenoscopy  09/21/2012    Procedure: ESOPHAGOGASTRODUODENOSCOPY (EGD);  Surgeon: Rogene Houston, MD;  [facility];  Service: Endoscopy;  Laterality: N/A;  345  . Peg placement  09/21/2012    Procedure: PERCUTANEOUS ENDOSCOPIC  GASTROSTOMY (PEG) PLACEMENT;  Surgeon: Rogene Houston, MD;  [facility];  Service: Endoscopy;  Laterality: N/A;  . Portacath placement    . Removal of gastrostomy tube      august 2014  . Colonoscopy N/A 12/26/2013    Procedure: COLONOSCOPY;  Surgeon: Rogene Houston, MD;  [facility];  Service: Endoscopy;  Laterality: N/A;  830    SOCIAL HISTORY: Social History   Social History  . Marital Status: Married    Spouse Name: N/A  . Number of Children: N/A  . Years of Education: N/A   Occupational History  . Not on file.   Social History Main Topics  . Smoking status: Current Every Day Smoker -- 1.00 packs/day for 50 years    Types: Cigarettes  . Smokeless tobacco: Never Used     Comment: 1/2 to 1 pack x50 yrs.  . Alcohol Use: 3.6 oz/week    6 Cans of beer per week     Comment: twice a week  . Drug Use: No  . Sexual Activity: Not on file   Other Topics Concern  . Not on file   Social History Narrative    FAMILY HISTORY: Family History  Problem Relation Age of Onset  . Stroke Mother   . Hypertension Father   . Cancer Brother     Throat cancer  . Kidney disease Daughter     Review of Systems  Constitutional: Negative for fever, chills, weight loss and malaise/fatigue.  HENT: Negative for congestion, hearing loss, nosebleeds, sore throat and tinnitus.   Eyes: Negative for blurred vision, double vision, pain and discharge.  Respiratory: Negative for cough, hemoptysis, sputum production, shortness of breath and wheezing.   Cardiovascular: Negative for chest pain, palpitations, claudication, leg swelling and PND.  Gastrointestinal: Negative for heartburn, nausea, vomiting, abdominal pain, diarrhea, constipation, blood in stool and melena.  Genitourinary: Negative for dysuria, urgency, frequency and hematuria.  Musculoskeletal: Negative for myalgias, joint pain and falls.  Skin: Negative for itching and rash.  Neurological: Negative for dizziness,  tingling, tremors, sensory change, speech change, focal weakness, seizures, loss of consciousness, weakness and headaches.  Endo/Heme/Allergies: Does not bruise/bleed easily.  Psychiatric/Behavioral: Negative for depression, suicidal ideas, memory loss and substance abuse. The patient is not nervous/anxious and does not have insomnia.    14 point review of systems was performed and is negative except as detailed under history of present illness and above   PHYSICAL EXAMINATION  ECOG PERFORMANCE STATUS: 0 - Asymptomatic  Filed Vitals:   11/18/15 1254  BP: 144/75  Pulse: 79  Temp: 98.3 F (36.8 C)  Resp: 16    Physical Exam  Constitutional: He is oriented to person, place, and time and well-developed, well-nourished, and in no distress.  Thin, weight stable, well groomed. Dentures top and bottom.  HENT:  Head: Normocephalic and atraumatic.  Nose: Nose normal.  Mouth/Throat: Oropharynx is clear and moist. No oropharyngeal exudate.  Eyes: Conjunctivae and EOM are normal. Pupils are equal, round, and reactive to light. Right eye exhibits no discharge. Left eye exhibits no discharge. No scleral icterus.  Neck: Normal range of motion. Neck supple. No tracheal deviation present. No thyromegaly present.  Cardiovascular:  Normal rate, regular rhythm and normal heart sounds.  Exam reveals no gallop and no friction rub.   No murmur heard. Pulmonary/Chest: Effort normal and breath sounds normal. He has no wheezes. He has no rales.  Abdominal: Soft. Bowel sounds are normal. He exhibits no distension and no mass. There is no tenderness. There is no rebound and no guarding.  Musculoskeletal: Normal range of motion. He exhibits no edema.  Lymphadenopathy:    He has no cervical adenopathy.  Neurological: He is alert and oriented to person, place, and time. He has normal reflexes. No cranial nerve deficit. Gait normal. Coordination normal.  Skin: Skin is warm and dry. No rash noted.  Psychiatric:  Mood, memory, affect and judgment normal.  Nursing note and vitals reviewed.   LABORATORY DATA: I have reviewed the data as listed.  CBC    Component Value Date/Time   WBC 6.8 07/21/2015 0929   RBC 4.49 07/21/2015 0929   HGB 15.2 07/21/2015 0929   HCT 44.0 07/21/2015 0929   PLT 217 07/21/2015 0929   MCV 98.0 07/21/2015 0929   MCH 33.9 07/21/2015 0929   MCHC 34.5 07/21/2015 0929   RDW 12.1 07/21/2015 0929   LYMPHSABS 1.0 07/21/2015 0929   MONOABS 0.8 07/21/2015 0929   EOSABS 0.2 07/21/2015 0929   BASOSABS 0.1 07/21/2015 0929   CMP     Component Value Date/Time   NA 127* 07/21/2015 0929   K 4.5 07/21/2015 0929   CL 94* 07/21/2015 0929   CO2 23 07/21/2015 0929   GLUCOSE 88 07/21/2015 0929   BUN 17 07/21/2015 0929   CREATININE 1.28* 07/21/2015 0929   CREATININE 1.37* 05/29/2014 0929   CALCIUM 9.6 07/21/2015 0929   PROT 7.3 07/21/2015 0929   ALBUMIN 4.4 07/21/2015 0929   AST 21 07/21/2015 0929   ALT 11 07/21/2015 0929   ALKPHOS 66 07/21/2015 0929   BILITOT 1.3* 07/21/2015 0929   GFRNONAA 52* 05/29/2014 0929   GFRNONAA 51* 04/23/2014 1050   GFRAA 61* 05/29/2014 0929   GFRAA 59* 04/23/2014 1050     ASSESSMENT and THERAPY PLAN:  Surveillance after combined modality therapy for 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 (10/11/2012- 12/10/2012) CKD Tobacco Use  Overall he is doing well. I have set him up for repeat scans. We will notify him of the results once available. He will be scheduled for return in 6 months.   I reviewed signs and symptoms of concern such as weight loss, loss of appetite or energy level, new pain and advised him to call for earlier follow-up if he develops any of these symptoms.  He was encouraging to discontinue smoking and offered several methods for cessation.  I discussed the risks of ongoing tobacco use  including malignancy, heart disease. He is currently not interested in pursuing smoking cessation, although he has cut back since the last time I have seen him.   Orders Placed This Encounter  Procedures  . CT Chest W Contrast    Amy, order in sys, esigned, [facility] Medicare, will get pac, nbs    Standing Status: Future     Number of Occurrences:      Standing Expiration Date: 11/17/2016    Order Specific Question:  If indicated for the ordered procedure, I authorize the administration of contrast media per Radiology protocol    Answer:  Yes    Order Specific Question:  Reason for  Exam (SYMPTOM  OR DIAGNOSIS REQUIRED)    Answer:  squamous cell carcinoma upper esophagus    Order Specific Question:  Preferred imaging location?    Answer:  [facility]  . CT Soft Tissue Neck W Contrast    Amy, order in sys, esigned, [facility] Medicare, will get pac, nbs     Standing Status: Future     Number of Occurrences:      Standing Expiration Date: 02/15/2017    Order Specific Question:  If indicated for the ordered procedure, I authorize the administration of contrast media per Radiology protocol    Answer:  Yes    Order Specific Question:  Reason for Exam (SYMPTOM  OR DIAGNOSIS REQUIRED)    Answer:  squamous cell carcinoma upper esophagus    Order Specific Question:  Preferred imaging location?    Answer:  [facility]   All questions were answered. The patient knows to call the clinic with any problems, questions or concerns. We can certainly see the patient much sooner if necessary.  This document serves as a record of services personally performed by Ancil Linsey, MD. It was created on her behalf by Toni Amend, a trained medical scribe. The creation of this record is based on the scribe's personal observations and the provider's statements to them. This document has been checked and approved by the attending provider.  I have reviewed the above documentation for accuracy and  completeness, and I agree with the above.  Molli Hazard, MD  11/18/2015

## 2015-11-18 NOTE — Patient Instructions (Addendum)
[place] at [facility] Discharge Instructions  RECOMMENDATIONS MADE BY THE CONSULTANT AND ANY TEST RESULTS WILL BE SENT TO YOUR REFERRING PHYSICIAN.  Exam completed by Dr Whitney Muse today Flu vaccine today Work hard on quitting smoking Return to see the doctor in 6 months CT of neck and chest, we will call you with these results Please call the clinic if you have any questions or concerns    Thank you for choosing [place] at [facility] to provide your oncology and hematology care.  To afford each patient quality time with our provider, please arrive at least 15 minutes before your scheduled appointment time.    You need to re-schedule your appointment should you arrive 10 or more minutes late.  We strive to give you quality time with our providers, and arriving late affects you and other patients whose appointments are after yours.  Also, if you no show three or more times for appointments you may be dismissed from the clinic at the providers discretion.     Again, thank you for choosing [facility].  Our hope is that these requests will decrease the amount of time that you wait before being seen by our physicians.       _____________________________________________________________  Should you have questions after your visit to [facility], please contact our office at [phone] between the hours of 8:30 a.m. and 4:30 p.m.  Voicemails left after 4:30 p.m. will not be returned until the following business day.  For prescription refill requests, have your pharmacy contact our office.

## 2015-12-23 ENCOUNTER — Ambulatory Visit (HOSPITAL_COMMUNITY)
Admission: RE | Admit: 2015-12-23 | Discharge: 2015-12-23 | Disposition: A | Discharge status: Home / Self Care | Payer: Medicare Other | Source: Ambulatory Visit | Attending: Hematology & Oncology | Admitting: Hematology & Oncology

## 2015-12-23 DIAGNOSIS — I6522 Occlusion and stenosis of left carotid artery: Secondary | ICD-10-CM | POA: Diagnosis not present

## 2015-12-23 DIAGNOSIS — C159 Malignant neoplasm of esophagus, unspecified: Secondary | ICD-10-CM | POA: Insufficient documentation

## 2015-12-23 LAB — POCT I-STAT CREATININE: Creatinine, Ser: 1.5 mg/dL — ABNORMAL HIGH (ref 0.61–1.24)

## 2015-12-23 MED ORDER — IOHEXOL 300 MG/ML  SOLN
100.0000 mL | Freq: Once | INTRAMUSCULAR | Status: AC | PRN
  Administered 2015-12-23: 100 mL via INTRAVENOUS

## 2016-01-05 ENCOUNTER — Other Ambulatory Visit (HOSPITAL_COMMUNITY): Payer: Self-pay | Admitting: Oncology

## 2016-01-05 DIAGNOSIS — C159 Malignant neoplasm of esophagus, unspecified: Secondary | ICD-10-CM

## 2016-01-05 DIAGNOSIS — Z720 Tobacco use: Secondary | ICD-10-CM

## 2016-01-05 DIAGNOSIS — I6523 Occlusion and stenosis of bilateral carotid arteries: Secondary | ICD-10-CM

## 2016-01-11 ENCOUNTER — Ambulatory Visit (HOSPITAL_COMMUNITY)
Admission: RE | Admit: 2016-01-11 | Discharge: 2016-01-11 | Disposition: A | Discharge status: Home / Self Care | Payer: Medicare Other | Source: Ambulatory Visit | Attending: Oncology | Admitting: Oncology

## 2016-01-11 DIAGNOSIS — I6523 Occlusion and stenosis of bilateral carotid arteries: Secondary | ICD-10-CM | POA: Diagnosis not present

## 2016-01-11 DIAGNOSIS — Z720 Tobacco use: Secondary | ICD-10-CM

## 2016-01-11 DIAGNOSIS — F17200 Nicotine dependence, unspecified, uncomplicated: Secondary | ICD-10-CM | POA: Insufficient documentation

## 2016-01-11 DIAGNOSIS — C159 Malignant neoplasm of esophagus, unspecified: Secondary | ICD-10-CM | POA: Diagnosis not present

## 2016-01-22 ENCOUNTER — Encounter: Payer: Self-pay | Admitting: Family Medicine

## 2016-01-22 ENCOUNTER — Ambulatory Visit (INDEPENDENT_AMBULATORY_CARE_PROVIDER_SITE_OTHER): Payer: Medicare Other | Admitting: Family Medicine

## 2016-01-22 VITALS — BP 134/78 | HR 76 | Temp 97.8°F | Resp 16 | Ht 67.0 in | Wt 133.0 lb

## 2016-01-22 DIAGNOSIS — I779 Disorder of arteries and arterioles, unspecified: Secondary | ICD-10-CM | POA: Insufficient documentation

## 2016-01-22 DIAGNOSIS — Z720 Tobacco use: Secondary | ICD-10-CM

## 2016-01-22 DIAGNOSIS — I10 Essential (primary) hypertension: Secondary | ICD-10-CM | POA: Diagnosis not present

## 2016-01-22 DIAGNOSIS — J432 Centrilobular emphysema: Secondary | ICD-10-CM

## 2016-01-22 DIAGNOSIS — N182 Chronic kidney disease, stage 2 (mild): Secondary | ICD-10-CM | POA: Diagnosis not present

## 2016-01-22 DIAGNOSIS — I739 Peripheral vascular disease, unspecified: Secondary | ICD-10-CM

## 2016-01-22 LAB — LIPID PANEL
CHOLESTEROL: 195 mg/dL (ref 125–200)
HDL: 87 mg/dL (ref >=40)
LDL Cholesterol: 90 mg/dL (ref <130)
Total CHOL/HDL Ratio: 2.2 Ratio (ref <=5.0)
Triglycerides: 88 mg/dL (ref <150)
VLDL: 18 mg/dL (ref <30)

## 2016-01-22 LAB — COMPREHENSIVE METABOLIC PANEL
ALK PHOS: 62 U/L (ref 40–115)
ALT: 12 U/L (ref 9–46)
AST: 17 U/L (ref 10–35)
Albumin: 4.9 g/dL (ref 3.6–5.1)
BILIRUBIN TOTAL: 1.6 mg/dL — AB (ref 0.2–1.2)
BUN: 18 mg/dL (ref 7–25)
CO2: 21 mmol/L (ref 20–31)
CREATININE: 1.44 mg/dL — AB (ref 0.70–1.25)
Calcium: 9.7 mg/dL (ref 8.6–10.3)
Chloride: 95 mmol/L — ABNORMAL LOW (ref 98–110)
Glucose, Bld: 88 mg/dL (ref 70–99)
POTASSIUM: 4.7 mmol/L (ref 3.5–5.3)
Sodium: 127 mmol/L — ABNORMAL LOW (ref 135–146)
TOTAL PROTEIN: 7.4 g/dL (ref 6.1–8.1)

## 2016-01-22 LAB — CBC WITH DIFFERENTIAL/PLATELET
BASOS ABS: 0.1 10*3/uL (ref 0.0–0.1)
Basophils Relative: 1 % (ref 0–1)
EOS ABS: 0.2 10*3/uL (ref 0.0–0.7)
Eosinophils Relative: 2 % (ref 0–5)
HCT: 45.1 % (ref 39.0–52.0)
Hemoglobin: 14.7 g/dL (ref 13.0–17.0)
LYMPHS ABS: 1.3 10*3/uL (ref 0.7–4.0)
LYMPHS PCT: 16 % (ref 12–46)
MCH: 32.4 pg (ref 26.0–34.0)
MCHC: 32.6 g/dL (ref 30.0–36.0)
MCV: 99.3 fL (ref 78.0–100.0)
MPV: 8.6 fL (ref 8.6–12.4)
Monocytes Absolute: 0.8 10*3/uL (ref 0.1–1.0)
Monocytes Relative: 10 % (ref 3–12)
NEUTROS PCT: 71 % (ref 43–77)
Neutro Abs: 5.8 10*3/uL (ref 1.7–7.7)
PLATELETS: 223 10*3/uL (ref 150–400)
RBC: 4.54 MIL/uL (ref 4.22–5.81)
RDW: 12.6 % (ref 11.5–15.5)
WBC: 8.1 10*3/uL (ref 4.0–10.5)

## 2016-01-22 MED ORDER — LISINOPRIL 20 MG PO TABS: 20.0000 mg | ORAL_TABLET | Freq: Every day | ORAL | Status: DC

## 2016-01-22 NOTE — Progress Notes (Signed)
Patient ID: Eddie [name], male   DOB: 03/14/1946, 70 y.o.   MRN: [id number]   Subjective:    Patient ID: Eddie [name], male    DOB: Oct 12, 1946, 70 y.o.   MRN: [id number]  Patient presents for Follow-up  patient follow-up chronic medical problems. He is concerned that his short-term memory has been changing. He states his wife will ask him to do something and he will forget unless she reminds him. He is still doing the finances and making the decisions about any difficulties with this. Is just small  Things around the home he may forget   he was seen by oncology to have his follow-up scans done his previous history of cancer. His scans did show concern for carotid artery disease he had ultrasound done which did show less than 50% blockage of arteries. He is not on a statin drug.    He has known pulmonary emphysema however he has not had any difficulty breathing he continues to smoke for hypertension as well as cigarettes a day he tried Chantix but continued to smoke throughout the medication.   Review Of Systems:  GEN- denies fatigue, fever, weight loss,weakness, recent illness HEENT- denies eye drainage, change in vision, nasal discharge, CVS- denies chest pain, palpitations RESP- denies SOB, cough, wheeze ABD- denies N/V, change in stools, abd pain GU- denies dysuria, hematuria, dribbling, incontinence MSK- denies joint pain, muscle aches, injury Neuro- denies headache, dizziness, syncope, seizure activity       Objective:    BP 134/78 mmHg  Pulse 76  Temp(Src) 97.8 F (36.6 C)  Resp 16  Ht 5\' 7"  (1.702 m)  Wt 133 lb (60.328 kg)  BMI 20.83 kg/m2 GEN- NAD, alert and oriented x3 HEENT- PERRL, EOMI, non injected sclera, pink conjunctiva, MMM, oropharynx clear Neck- Supple, no LAD CVS- RRR, no murmur RESP-CTAB Pulses- Radial - 2+ NEURO- CNII-XII intact, Mini mental normal       Assessment & Plan:      Given reassurance about memory changes at this time, no meds needed,  advised to do brain teasors, add MVI > 60 years old  Problem List Items Addressed This Visit    Tobacco use   Pulmonary emphysema ([place])    He is assymptomatic, continues to smoke, he is not quite ready to quit tobacco      Essential hypertension, benign    Well controlled, no change to meds      Relevant Medications   lisinopril (PRINIVIL,ZESTRIL) 20 MG tablet   Other Relevant Orders   CBC with Differential/Platelet (Completed)   Comprehensive metabolic panel (Completed)   Lipid panel (Completed)   CKD (chronic kidney disease), stage II - Primary   Relevant Orders   Comprehensive metabolic panel (Completed)   Carotid artery disease (HCC)    Check lipids, plan to start lose statin for cardiovascular prevention      Relevant Medications   lisinopril (PRINIVIL,ZESTRIL) 20 MG tablet   Other Relevant Orders   Lipid panel (Completed)      Note: This dictation was prepared with Dragon dictation along with smaller phrase technology. Any transcriptional errors that result from this process are unintentional.

## 2016-01-22 NOTE — Patient Instructions (Signed)
Add Multivitamin once a day  We will call with lab results  Work on the smoking  We will start cholesterol pill  F/U 6 months

## 2016-01-24 NOTE — Assessment & Plan Note (Signed)
Check lipids, plan to start lose statin for cardiovascular prevention

## 2016-01-24 NOTE — Assessment & Plan Note (Signed)
Well controlled, no change to meds 

## 2016-01-24 NOTE — Assessment & Plan Note (Signed)
He is assymptomatic, continues to smoke, he is not quite ready to quit tobacco

## 2016-01-25 ENCOUNTER — Other Ambulatory Visit: Payer: Self-pay | Admitting: *Deleted

## 2016-01-25 DIAGNOSIS — E871 Hypo-osmolality and hyponatremia: Secondary | ICD-10-CM

## 2016-01-27 ENCOUNTER — Other Ambulatory Visit: Payer: Self-pay | Admitting: Family Medicine

## 2016-01-27 DIAGNOSIS — E871 Hypo-osmolality and hyponatremia: Secondary | ICD-10-CM | POA: Diagnosis not present

## 2016-01-27 LAB — BASIC METABOLIC PANEL
BUN: 16 mg/dL (ref 7–25)
CHLORIDE: 97 mmol/L — AB (ref 98–110)
CO2: 23 mmol/L (ref 20–31)
Calcium: 9.4 mg/dL (ref 8.6–10.3)
Creat: 1.45 mg/dL — ABNORMAL HIGH (ref 0.70–1.25)
GLUCOSE: 94 mg/dL (ref 70–99)
POTASSIUM: 5.3 mmol/L (ref 3.5–5.3)
Sodium: 129 mmol/L — ABNORMAL LOW (ref 135–146)

## 2016-02-02 ENCOUNTER — Other Ambulatory Visit: Payer: Self-pay | Admitting: *Deleted

## 2016-02-02 DIAGNOSIS — E871 Hypo-osmolality and hyponatremia: Secondary | ICD-10-CM

## 2016-02-03 ENCOUNTER — Other Ambulatory Visit: Payer: Medicare Other

## 2016-02-03 DIAGNOSIS — E871 Hypo-osmolality and hyponatremia: Secondary | ICD-10-CM | POA: Diagnosis not present

## 2016-02-03 LAB — BASIC METABOLIC PANEL
BUN: 23 mg/dL (ref 7–25)
CHLORIDE: 96 mmol/L — AB (ref 98–110)
CO2: 24 mmol/L (ref 20–31)
CREATININE: 1.46 mg/dL — AB (ref 0.70–1.25)
Calcium: 9.3 mg/dL (ref 8.6–10.3)
Glucose, Bld: 92 mg/dL (ref 70–99)
Potassium: 5.2 mmol/L (ref 3.5–5.3)
Sodium: 130 mmol/L — ABNORMAL LOW (ref 135–146)

## 2016-02-04 LAB — OSMOLALITY: Osmolality: 273 mOsm/kg — ABNORMAL LOW (ref 275–300)

## 2016-02-05 ENCOUNTER — Other Ambulatory Visit: Payer: Medicare Other

## 2016-02-05 DIAGNOSIS — E871 Hypo-osmolality and hyponatremia: Secondary | ICD-10-CM | POA: Diagnosis not present

## 2016-02-06 LAB — OSMOLALITY, 24 HOUR URINE: Osmolality: 233 mOsm/kg — ABNORMAL LOW (ref 300–900)

## 2016-02-06 LAB — SODIUM, URINE, TIMED
SODIUM 24H UR: 46 mmol/(24.h) — AB (ref 52–380)
Sodium, Ur: 46 mmol/L (ref 28–272)

## 2016-02-06 LAB — CREATININE, URINE, 24 HOUR
CREATININE 24H UR: 0.6 g/(24.h) — AB (ref 0.63–2.50)
CREATININE, URINE: 60 mg/dL (ref 20–370)

## 2016-02-12 ENCOUNTER — Other Ambulatory Visit: Payer: Self-pay | Admitting: Family Medicine

## 2016-02-12 DIAGNOSIS — E871 Hypo-osmolality and hyponatremia: Secondary | ICD-10-CM

## 2016-03-11 ENCOUNTER — Encounter: Payer: Self-pay | Admitting: "Endocrinology

## 2016-03-11 ENCOUNTER — Ambulatory Visit (INDEPENDENT_AMBULATORY_CARE_PROVIDER_SITE_OTHER): Payer: Medicare Other | Admitting: "Endocrinology

## 2016-03-11 VITALS — BP 158/70 | HR 96 | Ht 67.0 in | Wt 135.0 lb

## 2016-03-11 DIAGNOSIS — E871 Hypo-osmolality and hyponatremia: Secondary | ICD-10-CM

## 2016-03-11 NOTE — Progress Notes (Signed)
Subjective:    Patient ID: Eddie [name], male    DOB: August 16, 1946, PCP Vic Blackbird, MD   Past Medical History  Diagnosis Date  . Hypertension   . Chronic back pain   . GERD (gastroesophageal reflux disease)   . Squamous cell carcinoma [facility]) Oct 2014    Esophogus  . Squamous cell carcinoma of esophagus ([place]) 09/25/2012  . Severe malnutrition ([place]) 10/26/2012  . Duodenal ulcer 09/2012  . Helicobacter pylori (H. pylori) infection 09/2012   Past Surgical History  Procedure Laterality Date  . Peg tube placement  09/21/2012  . Esophagogastroduodenoscopy  09/21/2012    Procedure: ESOPHAGOGASTRODUODENOSCOPY (EGD);  Surgeon: Rogene Houston, MD;  Location[facility];  Service: Endoscopy;  Laterality: N/A;  345  . Peg placement  09/21/2012    Procedure: PERCUTANEOUS ENDOSCOPIC GASTROSTOMY (PEG) PLACEMENT;  Surgeon: Rogene Houston, MD;  Location: [facility];  Service: Endoscopy;  Laterality: N/A;  . Portacath placement    . Removal of gastrostomy tube      august 2014  . Colonoscopy N/A 12/26/2013    Procedure: COLONOSCOPY;  Surgeon: Rogene Houston, MD;  Location: [facility];  Service: Endoscopy;  Laterality: N/A;  830   Social History   Social History  . Marital Status: Married    Spouse Name: N/A  . Number of Children: N/A  . Years of Education: N/A   Social History Main Topics  . Smoking status: Current Every Day Smoker -- 1.00 packs/day for 50 years    Types: Cigarettes  . Smokeless tobacco: Never Used     Comment: 1/2 to 1 pack x50 yrs.  . Alcohol Use: 3.6 oz/week    6 Cans of beer per week     Comment: twice a week  . Drug Use: No  . Sexual Activity: Not Asked   Other Topics Concern  . None   Social History Narrative   Outpatient Encounter Prescriptions as of 03/11/2016  Medication Sig  . Cyanocobalamin (VITAMIN B-12 PO) Take 1 tablet by mouth daily.  . hydrochlorothiazide (HYDRODIURIL) 25 MG tablet Take 25 mg by mouth daily.  Marland Kitchen lisinopril  (PRINIVIL,ZESTRIL) 20 MG tablet Take 1 tablet (20 mg total) by mouth daily.  Marland Kitchen terazosin (HYTRIN) 1 MG capsule Take 1 capsule (1 mg total) by mouth at bedtime.  . [DISCONTINUED] loratadine (CLARITIN) 10 MG tablet Take 1 tablet (10 mg total) by mouth daily. (Patient not taking: Reported on 11/18/2015)   No facility-administered encounter medications on file as of 03/11/2016.   ALLERGIES: Allergies  Allergen Reactions  . Motrin [Ibuprofen] Swelling   VACCINATION STATUS: Immunization History  Administered Date(s) Administered  . Influenza,inj,Quad PF,36+ Mos 11/12/2013, 09/09/2014, 11/18/2015  . Pneumococcal Conjugate-13 11/12/2013  . Pneumococcal Polysaccharide-23 10/26/2012    HPI 70 year old gentleman with a medical history as above. He is being seen in consultation for hyponatremia requested by Dr. Buelah Manis. -He is currently actively being treated for squamous cell carcinoma of the esophagus status post several rounds of chemotherapy. -He was found to have hyponatremia for several weeks now currently at 130 millimole per liter. -He denies specific symptoms from hyponatremia, denies any history of seizures, headaches, lightheadedness. -He reports generalized fatigue. He could not consume large meals at once. He did use feeding tube before he was reversed in 2013. -He is a chronic heavy smoker, unfortunately still smokes. -He is also a chronic user of alcohol and still uses "a couple beers" on a daily basis. - He  tried sore tablets which was observed to raise his blood pressure, and did not take any sort tablets for the last 4 weeks.   Review of Systems Constitutional:  + fatigue, no subjective hyperthermia/hypothermia Eyes: no blurry vision, no xerophthalmia ENT: no sore throat, no nodules palpated in throat, no dysphagia/odynophagia, no hoarseness Cardiovascular: no CP/SOB/palpitations/leg swelling Respiratory: no cough/SOB Gastrointestinal: no N/V/D/C Musculoskeletal: no  muscle/joint aches Skin: no rashes Neurological: no tremors/numbness/tingling/dizziness Psychiatric: no depression/anxiety  Objective:    BP 158/70 mmHg  Pulse 96  Ht 5\' 7"  (1.702 m)  Wt 135 lb (61.236 kg)  BMI 21.14 kg/m2  SpO2 100%  Wt Readings from Last 3 Encounters:  03/11/16 135 lb (61.236 kg)  01/22/16 133 lb (60.328 kg)  11/18/15 133 lb 14.4 oz (60.737 kg)    Physical Exam Constitutional: Light build, in NAD Eyes: PERRLA, EOMI, no exophthalmos ENT: moist mucous membranes, no thyromegaly, no cervical lymphadenopathy Cardiovascular: RRR, No MRG Respiratory: CTA B Gastrointestinal: abdomen soft, NT, ND, BS+ Musculoskeletal: no deformities, strength intact in all 4 Skin: Dry, tenting, warm, no rashes Neurological: no tremor with outstretched hands, DTR normal in all 4   CMP     Component Value Date/Time   NA 130* 02/03/2016 0900   K 5.2 02/03/2016 0900   CL 96* 02/03/2016 0900   CO2 24 02/03/2016 0900   GLUCOSE 92 02/03/2016 0900   BUN 23 02/03/2016 0900   CREATININE 1.46* 02/03/2016 0900   CREATININE 1.50* 12/23/2015 1259   CALCIUM 9.3 02/03/2016 0900   PROT 7.4 01/22/2016 0926   ALBUMIN 4.9 01/22/2016 0926   AST 17 01/22/2016 0926   ALT 12 01/22/2016 0926   ALKPHOS 62 01/22/2016 0926   BILITOT 1.6* 01/22/2016 0926   GFRNONAA 52* 05/29/2014 0929   GFRNONAA 51* 04/23/2014 1050   GFRAA 61* 05/29/2014 0929   GFRAA 59* 04/23/2014 1050     Diabetic Labs (most recent): Lab Results  Component Value Date   HGBA1C 4.7 04/02/2014   HGBA1C 5.0 01/10/2013     Lipid Panel ( most recent) Lipid Panel     Component Value Date/Time   CHOL 195 01/22/2016 0926   TRIG 88 01/22/2016 0926   HDL 87 01/22/2016 0926   CHOLHDL 2.2 01/22/2016 0926   VLDL 18 01/22/2016 0926   LDLCALC 90 01/22/2016 0926     Assessment & Plan:   1. Hyponatremia -I have reviewed his available records showing euvolemic hyponatremia associated with hyposmolarity of serum and urine  sodium of 46. This is suggestive of SIADH. -Source of ADH is unclear at this time although some head and neck cancers are known to produce ectopic ADH. - No suspect medication to cause hyponatremia in his list. -Hyponatremia seems to have developed slowly in his case, which makes it unnecessary to rush to correct it back to normal. He is not particularly symptomatic. Patients with subacute to chronic SIADH  usually have  a lower hypothalamic osmostat whereby they function very well with low normal sodium levels. -The mainstay of therapy is still modest fluid restriction. I discussed with him the need to cut free water intake by half. I will obtain serum osmolality, urine osmolality, serum uric acid, and complete metabolic panel in one week. I will also obtain a.m. cortisol and thyroid function test to rule out other endocrine causes of hyponatremia.  - I rather rare because of hyponatremia is [employer] Drink[employer] Potomania - I have advised him to slowly wean himself off of beer. -I have strongly urged  him to consider smoking cessation given the fact that he already has malignancy. -The cause of hyponatremia in his caze is most likely water excess and not net deficit of sodium hence no need for salt tablet supplements. This may cause or worsen hypertension.  - I advised patient to maintain close follow up with Vic Blackbird, MD for primary care needs. Follow up plan: Return in about 15 days (around 03/26/2016) for follow up with pre-visit labs.  Glade Lloyd, MD Phone: [phone]  Fax: [phone]   03/11/2016, 2:32 PM

## 2016-03-15 DIAGNOSIS — E871 Hypo-osmolality and hyponatremia: Secondary | ICD-10-CM | POA: Diagnosis not present

## 2016-03-16 LAB — OSMOLALITY, URINE: Osmolality, Ur: 383 mOsm/kg — ABNORMAL LOW (ref 390–1090)

## 2016-03-16 LAB — COMPLETE METABOLIC PANEL WITH GFR
ALT: 10 U/L (ref 9–46)
AST: 17 U/L (ref 10–35)
Albumin: 4.4 g/dL (ref 3.6–5.1)
Alkaline Phosphatase: 53 U/L (ref 40–115)
BUN: 23 mg/dL (ref 7–25)
CALCIUM: 9.4 mg/dL (ref 8.6–10.3)
CHLORIDE: 99 mmol/L (ref 98–110)
CO2: 22 mmol/L (ref 20–31)
CREATININE: 1.3 mg/dL — AB (ref 0.70–1.25)
GFR, EST AFRICAN AMERICAN: 65 mL/min (ref >=60)
GFR, EST NON AFRICAN AMERICAN: 56 mL/min — AB (ref >=60)
Glucose, Bld: 90 mg/dL (ref 65–99)
POTASSIUM: 4.9 mmol/L (ref 3.5–5.3)
SODIUM: 129 mmol/L — AB (ref 135–146)
TOTAL PROTEIN: 6.8 g/dL (ref 6.1–8.1)
Total Bilirubin: 1.2 mg/dL (ref 0.2–1.2)

## 2016-03-16 LAB — CORTISOL-AM, BLOOD: Cortisol - AM: 20.9 ug/dL

## 2016-03-16 LAB — TSH: TSH: 2.24 m[IU]/L (ref 0.40–4.50)

## 2016-03-16 LAB — URIC ACID: Uric Acid, Serum: 7 mg/dL (ref 4.0–7.8)

## 2016-03-16 LAB — OSMOLALITY: OSMOLALITY: 277 mosm/kg (ref 275–300)

## 2016-03-16 LAB — T4, FREE: FREE T4: 1.6 ng/dL (ref 0.8–1.8)

## 2016-03-28 ENCOUNTER — Ambulatory Visit (INDEPENDENT_AMBULATORY_CARE_PROVIDER_SITE_OTHER): Payer: Medicare Other | Admitting: "Endocrinology

## 2016-03-28 ENCOUNTER — Encounter: Payer: Self-pay | Admitting: "Endocrinology

## 2016-03-28 VITALS — BP 148/85 | HR 65 | Ht 67.0 in | Wt 134.0 lb

## 2016-03-28 DIAGNOSIS — E871 Hypo-osmolality and hyponatremia: Secondary | ICD-10-CM

## 2016-03-28 NOTE — Progress Notes (Signed)
Subjective:    Patient ID: Eddie [name], male    DOB: 28-Aug-1946, PCP Vic Blackbird, MD   Past Medical History  Diagnosis Date  . Hypertension   . Chronic back pain   . GERD (gastroesophageal reflux disease)   . Squamous cell carcinoma [facility]) Oct 2014    Esophogus  . Squamous cell carcinoma of esophagus ([place]) 09/25/2012  . Severe malnutrition ([place]) 10/26/2012  . Duodenal ulcer 09/2012  . Helicobacter pylori (H. pylori) infection 09/2012   Past Surgical History  Procedure Laterality Date  . Peg tube placement  09/21/2012  . Esophagogastroduodenoscopy  09/21/2012    Procedure: ESOPHAGOGASTRODUODENOSCOPY (EGD);  Surgeon: Rogene Houston, MD;  [facility];  Service: Endoscopy;  Laterality: N/A;  345  . Peg placement  09/21/2012    Procedure: PERCUTANEOUS ENDOSCOPIC GASTROSTOMY (PEG) PLACEMENT;  Surgeon: Rogene Houston, MD;  [facility];  Service: Endoscopy;  Laterality: N/A;  . Portacath placement    . Removal of gastrostomy tube      august 2014  . Colonoscopy N/A 12/26/2013    Procedure: COLONOSCOPY;  Surgeon: Rogene Houston, MD;  [facility];  Service: Endoscopy;  Laterality: N/A;  830   Social History   Social History  . Marital Status: Married    Spouse Name: N/A  . Number of Children: N/A  . Years of Education: N/A   Social History Main Topics  . Smoking status: Current Every Day Smoker -- 1.00 packs/day for 50 years    Types: Cigarettes  . Smokeless tobacco: Never Used     Comment: 1/2 to 1 pack x50 yrs.  . Alcohol Use: 3.6 oz/week    6 Cans of beer per week     Comment: twice a week  . Drug Use: No  . Sexual Activity: Not Asked   Other Topics Concern  . None   Social History Narrative   Outpatient Encounter Prescriptions as of 03/28/2016  Medication Sig  . Cyanocobalamin (VITAMIN B-12 PO) Take 1 tablet by mouth daily.  . hydrochlorothiazide (HYDRODIURIL) 25 MG tablet Take 25 mg by mouth daily.  Marland Kitchen lisinopril  (PRINIVIL,ZESTRIL) 20 MG tablet Take 1 tablet (20 mg total) by mouth daily.  Marland Kitchen terazosin (HYTRIN) 1 MG capsule Take 1 capsule (1 mg total) by mouth at bedtime.   No facility-administered encounter medications on file as of 03/28/2016.   ALLERGIES: Allergies  Allergen Reactions  . Motrin [Ibuprofen] Swelling   VACCINATION STATUS: Immunization History  Administered Date(s) Administered  . Influenza,inj,Quad PF,36+ Mos 11/12/2013, 09/09/2014, 11/18/2015  . Pneumococcal Conjugate-13 11/12/2013  . Pneumococcal Polysaccharide-23 10/26/2012    HPI 70 year old gentleman with a medical history as above. He is being seen in Follow-up for hyponatremia. -He is currently actively being treated for squamous cell carcinoma of the esophagus status post several rounds of chemotherapy. -He was found to have hyponatremia for several weeks now currently at 130 millimole per liter. -He denies specific symptoms from hyponatremia, denies any history of seizures, headaches, lightheadedness. -He reports generalized fatigue. He could not consume large meals at once. He did use feeding tube before he was reversed in 2013. -He is a chronic heavy smoker, unfortunately still smokes. -He is also a chronic user of alcohol and still uses "a couple beers" on a daily basis. - He tried salt tablets which was observed to raise his blood pressure, and did not take any salt tablets for the last 5 weeks.   Review of  Systems Constitutional:  + fatigue, no subjective hyperthermia/hypothermia Eyes: no blurry vision, no xerophthalmia ENT: no sore throat, no nodules palpated in throat, no dysphagia/odynophagia, no hoarseness Cardiovascular: no CP/SOB/palpitations/leg swelling Respiratory: no cough/SOB Gastrointestinal: no N/V/D/C Musculoskeletal: no muscle/joint aches Skin: no rashes Neurological: no tremors/numbness/tingling/dizziness Psychiatric: no depression/anxiety  Objective:    BP 148/85 mmHg  Pulse 65  Ht 5'  7" (1.702 m)  Wt 134 lb (60.782 kg)  BMI 20.98 kg/m2  SpO2 98%  Wt Readings from Last 3 Encounters:  03/28/16 134 lb (60.782 kg)  03/11/16 135 lb (61.236 kg)  01/22/16 133 lb (60.328 kg)    Physical Exam Constitutional: Light build, in NAD Eyes: PERRLA, EOMI, no exophthalmos ENT: moist mucous membranes, no thyromegaly, no cervical lymphadenopathy Cardiovascular: RRR, No MRG Respiratory: CTA B Gastrointestinal: abdomen soft, NT, ND, BS+ Musculoskeletal: no deformities, strength intact in all 4 Skin: Dry, tenting, warm, no rashes Neurological: no tremor with outstretched hands, DTR normal in all 4   CMP     Component Value Date/Time   NA 129* 03/15/2016 0751   K 4.9 03/15/2016 0751   CL 99 03/15/2016 0751   CO2 22 03/15/2016 0751   GLUCOSE 90 03/15/2016 0751   BUN 23 03/15/2016 0751   CREATININE 1.30* 03/15/2016 0751   CREATININE 1.50* 12/23/2015 1259   CALCIUM 9.4 03/15/2016 0751   PROT 6.8 03/15/2016 0751   ALBUMIN 4.4 03/15/2016 0751   AST 17 03/15/2016 0751   ALT 10 03/15/2016 0751   ALKPHOS 53 03/15/2016 0751   BILITOT 1.2 03/15/2016 0751   GFRNONAA 56* 03/15/2016 0751   GFRNONAA 52* 05/29/2014 0929   GFRAA 65 03/15/2016 0751   GFRAA 61* 05/29/2014 0929     Diabetic Labs (most recent): Lab Results  Component Value Date   HGBA1C 4.7 04/02/2014   HGBA1C 5.0 01/10/2013     Lipid Panel ( most recent) Lipid Panel     Component Value Date/Time   CHOL 195 01/22/2016 0926   TRIG 88 01/22/2016 0926   HDL 87 01/22/2016 0926   CHOLHDL 2.2 01/22/2016 0926   VLDL 18 01/22/2016 0926   LDLCALC 90 01/22/2016 0926    Serum osmolality from 03/15/2016 was improved at 277.  Results for LEYLAND, NICKLIN (MRN [id number]) as of 03/28/2016 16:40  Ref. Range 03/15/2016 07:51  Cortisol - AM Latest Units: mcg/dL 20.9  Glucose Latest Ref Range: 65-99 mg/dL 90  TSH Latest Ref Range: 0.40-4.50 mIU/L 2.24  T4,Free(Direct) Latest Ref Range: 0.8-1.8 ng/dL 1.6  Osmolality, Urine  Latest Ref Range: [phone] mOsm/kg 383 (L)     Assessment & Plan:   1. Hyponatremia - I have reviewed his repeat labs showing serum osmolality improving to 277, absence of hypothyroidism and adrenal insufficiency.  His urine osmolality is  383 . He has euvolemic hyponatremia associated with SIADH. -Source of ADH is unclear at this time although some head and neck cancers are known to produce ectopic ADH. - No suspect medication to cause hyponatremia in his list. -Hyponatremia seems to have developed slowly in his case, which makes it unnecessary to rush to correct it back to normal. He is not particularly symptomatic. Patients with subacute to chronic SIADH  usually have  a lower hypothalamic osmostat whereby they function very well with low normal sodium levels. -The mainstay of therapy is still modest fluid restriction. I discussed with him the need to cut free water intake by half. I will obtain serum osmolality and complete metabolic panel in 3 weeks. -  [place] rare cause of hyponatremia is [employer] Drinkers Potomania - I have advised him to slowly wean himself off of beer. -I have strongly urged him to consider smoking cessation given the fact that he already has malignancy. -The cause of hyponatremia in his caze is most likely water excess and not net deficit of sodium hence no need for salt tablet supplements. This may cause or worsen hypertension.  - I advised patient to maintain close follow up with Vic Blackbird, MD for primary care needs. Follow up plan: Return in about 4 weeks (around 04/25/2016) for follow up with pre-visit labs.  Glade Lloyd, MD Phone: [phone]  Fax: [phone]   03/28/2016, 4:39 PM

## 2016-04-08 ENCOUNTER — Other Ambulatory Visit: Payer: Medicare Other

## 2016-04-13 ENCOUNTER — Other Ambulatory Visit: Payer: Self-pay | Admitting: "Endocrinology

## 2016-04-13 DIAGNOSIS — E871 Hypo-osmolality and hyponatremia: Secondary | ICD-10-CM | POA: Diagnosis not present

## 2016-04-13 LAB — COMPLETE METABOLIC PANEL WITH GFR
ALK PHOS: 59 U/L (ref 40–115)
ALT: 13 U/L (ref 9–46)
AST: 16 U/L (ref 10–35)
Albumin: 4.7 g/dL (ref 3.6–5.1)
BUN: 25 mg/dL (ref 7–25)
CO2: 24 mmol/L (ref 20–31)
CREATININE: 1.43 mg/dL — AB (ref 0.70–1.25)
Calcium: 9.5 mg/dL (ref 8.6–10.3)
Chloride: 103 mmol/L (ref 98–110)
GFR, EST NON AFRICAN AMERICAN: 50 mL/min — AB (ref >=60)
GFR, Est African American: 58 mL/min — ABNORMAL LOW (ref >=60)
Glucose, Bld: 88 mg/dL (ref 65–99)
POTASSIUM: 5 mmol/L (ref 3.5–5.3)
SODIUM: 135 mmol/L (ref 135–146)
Total Bilirubin: 1.5 mg/dL — ABNORMAL HIGH (ref 0.2–1.2)
Total Protein: 7 g/dL (ref 6.1–8.1)

## 2016-04-14 LAB — OSMOLALITY: OSMOLALITY: 286 mosm/kg (ref 275–300)

## 2016-04-20 ENCOUNTER — Encounter (INDEPENDENT_AMBULATORY_CARE_PROVIDER_SITE_OTHER): Payer: Self-pay | Admitting: Internal Medicine

## 2016-04-27 ENCOUNTER — Ambulatory Visit: Payer: Medicare Other | Admitting: "Endocrinology

## 2016-05-18 ENCOUNTER — Ambulatory Visit (HOSPITAL_COMMUNITY): Payer: Self-pay | Admitting: Hematology & Oncology

## 2016-05-18 ENCOUNTER — Ambulatory Visit (HOSPITAL_COMMUNITY): Payer: Self-pay | Admitting: Oncology

## 2016-07-07 ENCOUNTER — Other Ambulatory Visit: Payer: Self-pay | Admitting: Family Medicine

## 2016-07-07 NOTE — Telephone Encounter (Signed)
Refill appropriate and filled per protocol. 

## 2016-07-14 ENCOUNTER — Encounter (INDEPENDENT_AMBULATORY_CARE_PROVIDER_SITE_OTHER): Payer: Self-pay | Admitting: Internal Medicine

## 2016-07-14 ENCOUNTER — Ambulatory Visit (INDEPENDENT_AMBULATORY_CARE_PROVIDER_SITE_OTHER): Payer: Medicare Other | Admitting: Internal Medicine

## 2016-07-14 VITALS — BP 156/80 | HR 72 | Temp 98.0°F | Ht 67.0 in | Wt 131.4 lb

## 2016-07-14 DIAGNOSIS — C159 Malignant neoplasm of esophagus, unspecified: Secondary | ICD-10-CM | POA: Diagnosis not present

## 2016-07-14 NOTE — Progress Notes (Signed)
Subjective:    Patient ID: Eddie [name], male    DOB: [birth year], 70 y.o.   MRN: [id number]  HPI Here today for follow up.  Hx of squamous cell carcinoma of the esophagus, diagnosed in October of 2013.  Treatment completed in January of 2014 utilizing continuous infusion 5-fluorouracil in conjunction with external beam radiotherapy. He says he is doing fine. He has lost 3.5 pounds since his last visit in August of 2016. His appetite remains good. He is eating 2 meals a days. He is eating anything he wants. No dysphagia.  No acid reflux.  No chest pain. No fever. Usually has a BM daily. No melena or BRRB. Last colonoscopy in 2015.  He continues to smoke about a pack of cigarettes a day x 50 yrs. He drinks 2 beers every other day. (16 oz x 2).  Some days he will drink more beer if it is on the weekend.  Followed by Dr. Whitney Muse.  CT chest with CM in January revealed:IMPRESSION: No evidence of metastatic disease in the chest.   05/27/2014 PET Scan: emphysema. Left common iliac dilatation.  IMPRESSION:  1. No evidence of residual/recurrent disease, including within the  area of soft tissue fullness in the upper esophagus detailed on  prior CT.  2. No evidence of metastatic disease. Foci of hypermetabolism within  the neck and chest, consistent with hypermetabolic "Brown" fat. This  slightly decreases sensitivity for hypermetabolic nodes.  3. Incidental findings, including coronary artery atherosclerosis.   12/26/2013 Colonoscopy: Dr. Laural Golden. Routine screening: Impression:  Examination performed to cecum.  8 mm pedunculated polyp snared from distal sigmoid colon.  Small external hemorrhoids.   Notes Recorded by Rogene Houston, MD on 12/29/2013 at 7:24 PM Polyp is a tubular adenoma. Size 8 mm. Results reviewed with patient Next TCS in 5 years   Review of Systems Past Medical History:  Diagnosis Date  . Chronic back pain   . Duodenal ulcer 09/2012  . GERD  (gastroesophageal reflux disease)   . Helicobacter pylori (H. pylori) infection 09/2012  . Hypertension   . Severe malnutrition ([place]) 10/26/2012  . Squamous cell carcinoma [facility]) Oct 2014   Esophogus  . Squamous cell carcinoma of esophagus ([place]) 09/25/2012    Past Surgical History:  Procedure Laterality Date  . COLONOSCOPY N/A 12/26/2013   Procedure: COLONOSCOPY;  Surgeon: Rogene Houston, MD;  [facility];  Service: Endoscopy;  Laterality: N/A;  830  . ESOPHAGOGASTRODUODENOSCOPY  09/21/2012   Procedure: ESOPHAGOGASTRODUODENOSCOPY (EGD);  Surgeon: Rogene Houston, MD;  [facility];  Service: Endoscopy;  Laterality: N/A;  345  . PEG PLACEMENT  09/21/2012   Procedure: PERCUTANEOUS ENDOSCOPIC GASTROSTOMY (PEG) PLACEMENT;  Surgeon: Rogene Houston, MD;  [facility];  Service: Endoscopy;  Laterality: N/A;  . PEG TUBE PLACEMENT  09/21/2012  . PORTACATH PLACEMENT    . REMOVAL OF GASTROSTOMY TUBE     august 2014    Allergies  Allergen Reactions  . Motrin [Ibuprofen] Swelling    Current Outpatient Prescriptions on File Prior to Visit  Medication Sig Dispense Refill  . Cyanocobalamin (VITAMIN B-12 PO) Take 1 tablet by mouth daily.    . hydrochlorothiazide (HYDRODIURIL) 25 MG tablet TAKE ONE TABLET BY MOUTH ONCE DAILY. 30 tablet 0  . lisinopril (PRINIVIL,ZESTRIL) 20 MG tablet Take 1 tablet (20 mg total) by mouth daily. 30 tablet 11  . terazosin (HYTRIN) 1 MG capsule TAKE ONE CAPSULE BY MOUTH AT BEDTIME. 30 capsule  0   No current facility-administered medications on file prior to visit.        Objective:   Physical Exam Blood pressure (!) 156/80, pulse 72, temperature 98 F (36.7 C), height 5\' 7"  (1.702 m), weight 131 lb 6.4 oz (59.6 kg). Alert and oriented. Skin warm and dry. Oral mucosa is moist.   . Sclera anicteric, conjunctivae is pink. Thyroid not enlarged. No cervical lymphadenopathy. Lungs clear. Heart regular rate and rhythm.  Abdomen is soft.  Bowel sounds are positive. No hepatomegaly. No abdominal masses felt. No tenderness.  No edema to lower extremities.          Assessment & Plan:  Hx of esophageal cancer. He is doing well. Followed by [place] at [facility]. He will have OV in 1 year.

## 2016-07-14 NOTE — Patient Instructions (Signed)
OV in 1 year.  

## 2016-07-22 ENCOUNTER — Encounter: Payer: Self-pay | Admitting: Family Medicine

## 2016-07-22 ENCOUNTER — Ambulatory Visit (INDEPENDENT_AMBULATORY_CARE_PROVIDER_SITE_OTHER): Payer: Medicare Other | Admitting: Family Medicine

## 2016-07-22 VITALS — BP 148/82 | HR 64 | Temp 98.1°F | Resp 16 | Ht 67.0 in | Wt 132.0 lb

## 2016-07-22 DIAGNOSIS — I10 Essential (primary) hypertension: Secondary | ICD-10-CM | POA: Diagnosis not present

## 2016-07-22 DIAGNOSIS — I779 Disorder of arteries and arterioles, unspecified: Secondary | ICD-10-CM

## 2016-07-22 DIAGNOSIS — N182 Chronic kidney disease, stage 2 (mild): Secondary | ICD-10-CM | POA: Diagnosis not present

## 2016-07-22 DIAGNOSIS — E871 Hypo-osmolality and hyponatremia: Secondary | ICD-10-CM | POA: Diagnosis not present

## 2016-07-22 DIAGNOSIS — I739 Peripheral vascular disease, unspecified: Secondary | ICD-10-CM

## 2016-07-22 DIAGNOSIS — Z720 Tobacco use: Secondary | ICD-10-CM | POA: Diagnosis not present

## 2016-07-22 LAB — CBC WITH DIFFERENTIAL/PLATELET
BASOS ABS: 76 {cells}/uL (ref 0–200)
Basophils Relative: 1 %
EOS ABS: 228 {cells}/uL (ref 15–500)
Eosinophils Relative: 3 %
HEMATOCRIT: 44.7 % (ref 38.5–50.0)
HEMOGLOBIN: 15.1 g/dL (ref 13.0–17.0)
LYMPHS ABS: 1672 {cells}/uL (ref 850–3900)
LYMPHS PCT: 22 %
MCH: 33.1 pg — AB (ref 27.0–33.0)
MCHC: 33.8 g/dL (ref 32.0–36.0)
MCV: 98 fL (ref 80.0–100.0)
MONO ABS: 684 {cells}/uL (ref 200–950)
MPV: 8.9 fL (ref 7.5–12.5)
Monocytes Relative: 9 %
NEUTROS PCT: 65 %
Neutro Abs: 4940 cells/uL (ref 1500–7800)
Platelets: 227 10*3/uL (ref 140–400)
RBC: 4.56 MIL/uL (ref 4.20–5.80)
RDW: 12.2 % (ref 11.0–15.0)
WBC: 7.6 10*3/uL (ref 3.8–10.8)

## 2016-07-22 LAB — LIPID PANEL
CHOLESTEROL: 167 mg/dL (ref 125–200)
HDL: 76 mg/dL (ref >=40)
LDL CALC: 78 mg/dL (ref <130)
TRIGLYCERIDES: 65 mg/dL (ref <150)
Total CHOL/HDL Ratio: 2.2 Ratio (ref <=5.0)
VLDL: 13 mg/dL (ref <30)

## 2016-07-22 LAB — COMPREHENSIVE METABOLIC PANEL
ALBUMIN: 4.5 g/dL (ref 3.6–5.1)
ALT: 12 U/L (ref 9–46)
AST: 21 U/L (ref 10–35)
Alkaline Phosphatase: 64 U/L (ref 40–115)
BUN: 17 mg/dL (ref 7–25)
CALCIUM: 9.5 mg/dL (ref 8.6–10.3)
CHLORIDE: 96 mmol/L — AB (ref 98–110)
CO2: 22 mmol/L (ref 20–31)
Creat: 1.41 mg/dL — ABNORMAL HIGH (ref 0.70–1.25)
Glucose, Bld: 73 mg/dL (ref 70–99)
POTASSIUM: 5.1 mmol/L (ref 3.5–5.3)
Sodium: 126 mmol/L — ABNORMAL LOW (ref 135–146)
Total Bilirubin: 1.4 mg/dL — ABNORMAL HIGH (ref 0.2–1.2)
Total Protein: 6.8 g/dL (ref 6.1–8.1)

## 2016-07-22 MED ORDER — HYDROCHLOROTHIAZIDE 25 MG PO TABS: 25.0000 mg | ORAL_TABLET | Freq: Every day | ORAL | 11 refills | Status: DC

## 2016-07-22 MED ORDER — LISINOPRIL 20 MG PO TABS: 20.0000 mg | ORAL_TABLET | Freq: Every day | ORAL | 11 refills | Status: DC

## 2016-07-22 MED ORDER — TERAZOSIN HCL 2 MG PO CAPS: 2.0000 mg | ORAL_CAPSULE | Freq: Every day | ORAL | 6 refills | Status: DC

## 2016-07-22 MED ORDER — SIMVASTATIN 10 MG PO TABS: 10.0000 mg | ORAL_TABLET | Freq: Every day | ORAL | 11 refills | Status: DC

## 2016-07-22 NOTE — Assessment & Plan Note (Signed)
Improved to 135 last check, he is fluid restricting

## 2016-07-22 NOTE — Patient Instructions (Addendum)
Hytrin increased to 2mg  for your blood pressure  New cholesterol pill, zocor at bedtime  Continue all other medications F/U 4 months Physical

## 2016-07-22 NOTE — Assessment & Plan Note (Signed)
Start zocor 10mg  at bedtime

## 2016-07-22 NOTE — Assessment & Plan Note (Signed)
Continue counsel on tobacco cessation

## 2016-07-22 NOTE — Assessment & Plan Note (Signed)
Uncontrolled, increasae hytrin to 2mg 

## 2016-07-22 NOTE — Progress Notes (Signed)
   Subjective:    Patient ID: Eddie [name], male    DOB: October 07, 1946, 70 y.o.   MRN: [id number]  Patient presents for 6 month F/U (is fasting)  Pt here for six-month follow-up on chronic medical problems Last visit he was noted to have carotid artery disease he was to start  on a low-dose statin drug but did not get this  Hypertension his blood pressure has been elevated past few visits, even with specialist, he denies any symptoms of CP, SOB, Headache  Continue to reiterate importance of tobacco cessation as some pulmonary emphysema and history of squamous cell carcinoma of the esophagus  He was also referred to endocrine due to hyponatremia, in setting of renal disease, concern for adrenal insuffiency. Currently thought to be mild SIADH, fluid restriction given , last sodium level 135  No concerns today  Review Of Systems:  GEN- denies fatigue, fever, weight loss,weakness, recent illness HEENT- denies eye drainage, change in vision, nasal discharge, CVS- denies chest pain, palpitations RESP- denies SOB, cough, wheeze ABD- denies N/V, change in stools, abd pain GU- denies dysuria, hematuria, dribbling, incontinence MSK- denies joint pain, muscle aches, injury Neuro- denies headache, dizziness, syncope, seizure activity       Objective:    BP (!) 148/82   Pulse 64   Temp 98.1 F (36.7 C) (Oral)   Resp 16   Ht 5\' 7"  (1.702 m)   Wt 132 lb (59.9 kg)   BMI 20.67 kg/m  GEN- NAD, alert and oriented x3 HEENT- PERRL, EOMI, non injected sclera, pink conjunctiva, MMM, oropharynx clear CVS- RRR, no murmur RESP-CTAB EXT- No edema Pulses- Radial, DP- 2+  Repeat BP 150/80      Assessment & Plan:      Problem List Items Addressed This Visit    Tobacco use - Primary    Continue counsel on tobacco cessation      Hyponatremia    Improved to 135 last check, he is fluid restricting      Essential hypertension, benign    Uncontrolled, increasae hytrin to 2mg        Relevant  Medications   simvastatin (ZOCOR) 10 MG tablet   lisinopril (PRINIVIL,ZESTRIL) 20 MG tablet   hydrochlorothiazide (HYDRODIURIL) 25 MG tablet   terazosin (HYTRIN) 2 MG capsule   Other Relevant Orders   CBC with Differential/Platelet   Comprehensive metabolic panel   Lipid panel   CKD (chronic kidney disease), stage II   Carotid artery disease (HCC)    Start zocor 10mg  at bedtime       Relevant Medications   simvastatin (ZOCOR) 10 MG tablet   lisinopril (PRINIVIL,ZESTRIL) 20 MG tablet   hydrochlorothiazide (HYDRODIURIL) 25 MG tablet   terazosin (HYTRIN) 2 MG capsule   Other Relevant Orders   Lipid panel    Other Visit Diagnoses   None.     Note: This dictation was prepared with Dragon dictation along with smaller phrase technology. Any transcriptional errors that result from this process are unintentional.

## 2016-07-28 ENCOUNTER — Other Ambulatory Visit: Payer: Self-pay | Admitting: *Deleted

## 2016-07-28 DIAGNOSIS — E871 Hypo-osmolality and hyponatremia: Secondary | ICD-10-CM

## 2016-12-09 ENCOUNTER — Other Ambulatory Visit: Payer: Self-pay | Admitting: Nurse Practitioner

## 2017-04-06 ENCOUNTER — Other Ambulatory Visit: Payer: Self-pay | Admitting: Family Medicine

## 2017-04-28 ENCOUNTER — Ambulatory Visit: Payer: Medicare Other | Admitting: Family Medicine

## 2017-05-04 ENCOUNTER — Other Ambulatory Visit: Payer: Self-pay | Admitting: Family Medicine

## 2017-06-06 ENCOUNTER — Other Ambulatory Visit: Payer: Self-pay | Admitting: Family Medicine

## 2017-06-09 ENCOUNTER — Encounter (INDEPENDENT_AMBULATORY_CARE_PROVIDER_SITE_OTHER): Payer: Self-pay | Admitting: Internal Medicine

## 2017-06-09 ENCOUNTER — Encounter (INDEPENDENT_AMBULATORY_CARE_PROVIDER_SITE_OTHER): Payer: Self-pay

## 2017-07-14 ENCOUNTER — Ambulatory Visit (INDEPENDENT_AMBULATORY_CARE_PROVIDER_SITE_OTHER): Payer: Self-pay | Admitting: Internal Medicine

## 2017-08-03 ENCOUNTER — Other Ambulatory Visit: Payer: Self-pay | Admitting: Family Medicine

## 2017-08-14 ENCOUNTER — Ambulatory Visit (INDEPENDENT_AMBULATORY_CARE_PROVIDER_SITE_OTHER): Payer: Self-pay | Admitting: Internal Medicine

## 2017-08-15 ENCOUNTER — Encounter (INDEPENDENT_AMBULATORY_CARE_PROVIDER_SITE_OTHER): Payer: Self-pay | Admitting: Internal Medicine

## 2017-09-05 ENCOUNTER — Other Ambulatory Visit: Payer: Self-pay | Admitting: Family Medicine

## 2017-09-07 ENCOUNTER — Ambulatory Visit (INDEPENDENT_AMBULATORY_CARE_PROVIDER_SITE_OTHER): Payer: Medicare Other | Admitting: Family Medicine

## 2017-09-07 DIAGNOSIS — Z23 Encounter for immunization: Secondary | ICD-10-CM | POA: Diagnosis not present

## 2017-10-05 ENCOUNTER — Other Ambulatory Visit: Payer: Self-pay | Admitting: Family Medicine

## 2017-11-06 ENCOUNTER — Other Ambulatory Visit: Payer: Self-pay | Admitting: Family Medicine

## 2017-12-07 ENCOUNTER — Other Ambulatory Visit: Payer: Self-pay | Admitting: Family Medicine

## 2017-12-08 ENCOUNTER — Other Ambulatory Visit: Payer: Self-pay | Admitting: *Deleted

## 2017-12-08 MED ORDER — TERAZOSIN HCL 2 MG PO CAPS: 2.0000 mg | ORAL_CAPSULE | Freq: Every day | ORAL | 0 refills | Status: DC

## 2017-12-08 MED ORDER — HYDROCHLOROTHIAZIDE 25 MG PO TABS: 25.0000 mg | ORAL_TABLET | Freq: Every day | ORAL | 0 refills | Status: DC

## 2017-12-08 MED ORDER — LISINOPRIL 20 MG PO TABS: 20.0000 mg | ORAL_TABLET | Freq: Every day | ORAL | 0 refills | Status: DC

## 2017-12-08 MED ORDER — SIMVASTATIN 10 MG PO TABS: 10.0000 mg | ORAL_TABLET | Freq: Every day | ORAL | 0 refills | Status: DC

## 2017-12-15 ENCOUNTER — Ambulatory Visit (INDEPENDENT_AMBULATORY_CARE_PROVIDER_SITE_OTHER): Payer: Medicare Other | Admitting: Family Medicine

## 2017-12-15 ENCOUNTER — Encounter: Payer: Self-pay | Admitting: Family Medicine

## 2017-12-15 VITALS — BP 140/78 | HR 74 | Temp 98.9°F | Resp 14 | Ht 67.0 in | Wt 128.0 lb

## 2017-12-15 DIAGNOSIS — Z720 Tobacco use: Secondary | ICD-10-CM | POA: Diagnosis not present

## 2017-12-15 DIAGNOSIS — Z8501 Personal history of malignant neoplasm of esophagus: Secondary | ICD-10-CM | POA: Diagnosis not present

## 2017-12-15 DIAGNOSIS — I10 Essential (primary) hypertension: Secondary | ICD-10-CM

## 2017-12-15 DIAGNOSIS — N182 Chronic kidney disease, stage 2 (mild): Secondary | ICD-10-CM

## 2017-12-15 LAB — CBC WITH DIFFERENTIAL/PLATELET
Basophils Absolute: 68 cells/uL (ref 0–200)
Basophils Relative: 0.8 %
Eosinophils Absolute: 196 cells/uL (ref 15–500)
Eosinophils Relative: 2.3 %
HCT: 43.1 % (ref 38.5–50.0)
HEMOGLOBIN: 14.3 g/dL (ref 13.2–17.1)
Lymphs Abs: 1513 cells/uL (ref 850–3900)
MCH: 31.8 pg (ref 27.0–33.0)
MCHC: 33.2 g/dL (ref 32.0–36.0)
MCV: 95.8 fL (ref 80.0–100.0)
MONOS PCT: 8.5 %
MPV: 9.6 fL (ref 7.5–12.5)
NEUTROS ABS: 6001 {cells}/uL (ref 1500–7800)
Neutrophils Relative %: 70.6 %
Platelets: 222 10*3/uL (ref 140–400)
RBC: 4.5 10*6/uL (ref 4.20–5.80)
RDW: 10.7 % — ABNORMAL LOW (ref 11.0–15.0)
Total Lymphocyte: 17.8 %
WBC mixed population: 723 cells/uL (ref 200–950)
WBC: 8.5 10*3/uL (ref 3.8–10.8)

## 2017-12-15 LAB — COMPREHENSIVE METABOLIC PANEL
AG RATIO: 2 (calc) (ref 1.0–2.5)
ALKALINE PHOSPHATASE (APISO): 63 U/L (ref 40–115)
ALT: 8 U/L — AB (ref 9–46)
AST: 15 U/L (ref 10–35)
Albumin: 4.7 g/dL (ref 3.6–5.1)
BILIRUBIN TOTAL: 1.1 mg/dL (ref 0.2–1.2)
BUN / CREAT RATIO: 11 (calc) (ref 6–22)
BUN: 17 mg/dL (ref 7–25)
CALCIUM: 9.6 mg/dL (ref 8.6–10.3)
CO2: 26 mmol/L (ref 20–32)
Chloride: 103 mmol/L (ref 98–110)
Creat: 1.54 mg/dL — ABNORMAL HIGH (ref 0.70–1.25)
GLUCOSE: 80 mg/dL (ref 65–99)
Globulin: 2.4 g/dL (calc) (ref 1.9–3.7)
Potassium: 4.7 mmol/L (ref 3.5–5.3)
Sodium: 138 mmol/L (ref 135–146)
Total Protein: 7.1 g/dL (ref 6.1–8.1)

## 2017-12-15 LAB — LIPID PANEL
Cholesterol: 173 mg/dL (ref <200)
HDL: 81 mg/dL (ref >40)
LDL Cholesterol (Calc): 74 mg/dL (calc)
Non-HDL Cholesterol (Calc): 92 mg/dL (calc) (ref <130)
TRIGLYCERIDES: 94 mg/dL (ref <150)
Total CHOL/HDL Ratio: 2.1 (calc) (ref <5.0)

## 2017-12-15 NOTE — Patient Instructions (Addendum)
We will call with lab results Continue currentt medications  Reschedule your appointment with the gastroenterologist [phone] Try the nicoderm patch  F/U 6 months for PHYSICAL

## 2017-12-15 NOTE — Progress Notes (Signed)
   Subjective:    Patient ID: Eddie [name], male    DOB: [birth year], 72 y.o.   MRN: [id number]  Patient presents for Hypertension    P there to f/u chronic medical medical problems. Feels good.    HTN- taking lisinopril, HCTZ no side effects  Hyperlipidemia- TAKING STATIN drug no concenrs   Has good appetite    Hx of SCC of esohophogus in 2013, appeite is good, no difficlty swallowing, weight 132 in August 2017 Contineus to smoke, interested in Nicoderm  Review Of Systems:  GEN- denies fatigue, fever, weight loss,weakness, recent illness HEENT- denies eye drainage, change in vision, nasal discharge, CVS- denies chest pain, palpitations RESP- denies SOB, cough, wheeze ABD- denies N/V, change in stools, abd pain GU- denies dysuria, hematuria, dribbling, incontinence MSK- denies joint pain, muscle aches, injury Neuro- denies headache, dizziness, syncope, seizure activity       Objective:    BP 140/78   Pulse 74   Temp 98.9 F (37.2 C) (Oral)   Resp 14   Ht 5\' 7"  (1.702 m)   Wt 128 lb (58.1 kg)   SpO2 97%   BMI 20.05 kg/m   Repeat  142/80 GEN- NAD, alert and oriented x3 HEENT- PERRL, EOMI, non injected sclera, pink conjunctiva, MMM, oropharynx clear Neck- Supple, no thyromegaly CVS- RRR, no murmur RESP-CTAB ABD-NABS,soft,NT,ND EXT- No edema Pulses- Radial, DP- 2+        Assessment & Plan:      Problem List Items Addressed This Visit      Unprioritized   CKD (chronic kidney disease), stage II   Relevant Orders   CBC with Differential/Platelet (Completed)   Comprehensive metabolic panel (Completed)   Tobacco use    Trial of nicorderm patch      History of esophageal cancer    Pt to call and schedule his yearly f/u with GI, no dysphagia symptoms      Essential hypertension, benign - Primary    BP looks okay for age, continue current meds Check his metabolic panel      Relevant Orders   CBC with Differential/Platelet (Completed)   Lipid panel  (Completed)      Note: This dictation was prepared with Dragon dictation along with smaller phrase technology. Any transcriptional errors that result from this process are unintentional.

## 2017-12-16 ENCOUNTER — Encounter: Payer: Self-pay | Admitting: Family Medicine

## 2017-12-16 MED ORDER — NICOTINE 21 MG/24HR TD PT24: 21.0000 mg | MEDICATED_PATCH | Freq: Every day | TRANSDERMAL | 0 refills | Status: DC

## 2017-12-16 NOTE — Assessment & Plan Note (Signed)
Trial of nicorderm patch

## 2017-12-16 NOTE — Assessment & Plan Note (Signed)
Pt to call and schedule his yearly f/u with GI, no dysphagia symptoms

## 2017-12-16 NOTE — Assessment & Plan Note (Signed)
BP looks okay for age, continue current meds Check his metabolic panel

## 2017-12-20 ENCOUNTER — Encounter: Payer: Self-pay | Admitting: *Deleted

## 2018-01-05 ENCOUNTER — Other Ambulatory Visit: Payer: Self-pay | Admitting: Family Medicine

## 2018-02-02 ENCOUNTER — Other Ambulatory Visit: Payer: Self-pay | Admitting: Family Medicine

## 2018-03-07 ENCOUNTER — Other Ambulatory Visit: Payer: Self-pay | Admitting: Family Medicine

## 2018-04-07 ENCOUNTER — Other Ambulatory Visit: Payer: Self-pay | Admitting: Family Medicine

## 2018-06-06 ENCOUNTER — Other Ambulatory Visit: Payer: Self-pay | Admitting: Family Medicine

## 2018-08-07 ENCOUNTER — Other Ambulatory Visit: Payer: Self-pay | Admitting: Family Medicine

## 2018-09-07 ENCOUNTER — Other Ambulatory Visit: Payer: Self-pay | Admitting: Family Medicine

## 2018-10-08 ENCOUNTER — Ambulatory Visit (INDEPENDENT_AMBULATORY_CARE_PROVIDER_SITE_OTHER): Payer: Medicare Other | Admitting: Family Medicine

## 2018-10-08 ENCOUNTER — Encounter: Payer: Self-pay | Admitting: Family Medicine

## 2018-10-08 VITALS — BP 162/86 | HR 86 | Temp 98.2°F | Resp 16 | Ht 67.0 in | Wt 129.0 lb

## 2018-10-08 DIAGNOSIS — Z720 Tobacco use: Secondary | ICD-10-CM

## 2018-10-08 DIAGNOSIS — I10 Essential (primary) hypertension: Secondary | ICD-10-CM

## 2018-10-08 DIAGNOSIS — Z125 Encounter for screening for malignant neoplasm of prostate: Secondary | ICD-10-CM | POA: Diagnosis not present

## 2018-10-08 DIAGNOSIS — N182 Chronic kidney disease, stage 2 (mild): Secondary | ICD-10-CM

## 2018-10-08 MED ORDER — TERAZOSIN HCL 2 MG PO CAPS: 2.0000 mg | ORAL_CAPSULE | Freq: Every day | ORAL | 0 refills | Status: DC

## 2018-10-08 MED ORDER — HYDROCHLOROTHIAZIDE 25 MG PO TABS: 25.0000 mg | ORAL_TABLET | Freq: Every day | ORAL | 0 refills | Status: DC

## 2018-10-08 MED ORDER — LISINOPRIL 40 MG PO TABS: 20.0000 mg | ORAL_TABLET | Freq: Every day | ORAL | 5 refills | Status: DC

## 2018-10-08 MED ORDER — SIMVASTATIN 10 MG PO TABS: 10.0000 mg | ORAL_TABLET | Freq: Every day | ORAL | 0 refills | Status: DC

## 2018-10-08 NOTE — Progress Notes (Signed)
Subjective:    Patient ID: Eddie [name], male    DOB: Nov 03, 1946, 72 y.o.   MRN: [id number]  HPI He is a very pleasant 72 year old African-American male who presents today for follow-up of his blood pressure.  Past medical history significant for tobacco abuse, carotid artery disease, emphysema, and stage III chronic kidney disease.  His last GFR was calculated to be around 58 mL of blood per minute in January.  His blood pressure today however is extremely high at 162/86.  He denies any chest pain, shortness of breath, or dyspnea on exertion.  He states that he is compliant with the lisinopril, terazosin, and hydrochlorothiazide.  However he does eat salt.  He is also continuing to smoke. Past Medical History:  Diagnosis Date  . Chronic back pain   . Duodenal ulcer 09/2012  . GERD (gastroesophageal reflux disease)   . Helicobacter pylori (H. pylori) infection 09/2012  . Hypertension   . Severe malnutrition ([place]) 10/26/2012  . 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 Squamous cell carcinoma of esophagus ([place]) 09/25/2012   Past Surgical History:  Procedure Laterality Date  . COLONOSCOPY N/A 12/26/2013   Procedure: COLONOSCOPY;  Surgeon: Rogene Houston, MD;  [facility];  Service: Endoscopy;  Laterality: N/A;  830  . ESOPHAGOGASTRODUODENOSCOPY  09/21/2012   Procedure: ESOPHAGOGASTRODUODENOSCOPY (EGD);  Surgeon: Rogene Houston, MD;  [facility];  Service: Endoscopy;  Laterality: N/A;  345  . PEG PLACEMENT  09/21/2012   Procedure: PERCUTANEOUS ENDOSCOPIC GASTROSTOMY (PEG) PLACEMENT;  Surgeon: Rogene Houston, MD;  [facility]: [facility];  Service: Endoscopy;  Laterality: N/A;  . PEG TUBE PLACEMENT  09/21/2012  . PORTACATH PLACEMENT    . REMOVAL OF GASTROSTOMY TUBE     august 2014   Current Outpatient Medications on File Prior to Visit  Medication Sig Dispense Refill  . Cyanocobalamin (VITAMIN B-12 PO) Take 1 tablet by mouth daily.     No current  facility-administered medications on file prior to visit.    Allergies  Allergen Reactions  . Motrin [Ibuprofen] Swelling   Social History   Socioeconomic History  . Marital status: Married    Spouse name: Not on file  . Number of children: Not on file  . Years of education: Not on file  . Highest education level: Not on file  Occupational History  . Not on file  Social Needs  . Financial resource strain: Not on file  . Food insecurity:    Worry: Not on file    Inability: Not on file  . Transportation needs:    Medical: Not on file    Non-medical: Not on file  Tobacco Use  . Smoking status: Current Every Day Smoker    Packs/day: 1.00    Years: 50.00    Pack years: 50.00    Types: Cigarettes  . Smokeless tobacco: Never Used  . Tobacco comment: 1/2 to 1 pack x50 yrs.  Substance and Sexual Activity  . Alcohol use: Yes    Alcohol/week: 6.0 standard drinks    Types: 6 Cans of beer per week    Comment: twice a week  . Drug use: No  . Sexual activity: Not on file  Lifestyle  . Physical activity:    Days per week: Not on file    Minutes per session: Not on file  . Stress: Not on file  Relationships  . Social connections:    Talks on phone: Not on  file    Gets together: Not on file    Attends religious service: Not on file    Active member of club or organization: Not on file    Attends meetings of clubs or organizations: Not on file    Relationship status: Not on file  . Intimate partner violence:    Fear of current or ex partner: Not on file    Emotionally abused: Not on file    Physically abused: Not on file    Forced sexual activity: Not on file  Other Topics Concern  . Not on file  Social History Narrative  . Not on file      Review of Systems  All other systems reviewed and are negative.      Objective:   Physical Exam  Constitutional: He appears well-developed and well-nourished.  Neck: Normal range of motion. Neck supple. No JVD present.    Cardiovascular: Normal rate, regular rhythm and normal heart sounds.  No murmur heard. Pulmonary/Chest: Effort normal. He has wheezes.  Abdominal: Soft. Bowel sounds are normal. He exhibits no distension and no mass. There is no tenderness. There is no guarding.  Musculoskeletal: He exhibits no edema.  Lymphadenopathy:    He has no cervical adenopathy.          Assessment & Plan:  Benign essential HTN - Plan: CBC with Differential/Platelet, COMPLETE METABOLIC PANEL WITH GFR, Lipid panel  Prostate cancer screening - Plan: PSA  Essential hypertension, benign  CKD (chronic kidney disease), stage II  Tobacco use  Concerned by his blood pressure.  I want to increase lisinopril to 40 mg a day and recheck his blood pressure in 1 month with his PCP.  He has stage III chronic kidney disease.  I will repeat a BMP today to monitor his kidney function.  Encouraged avoidance of NSAIDs.  Encouraged increasing fluid intake.  Recommended smoking cessation.  Check fasting lipid panel.  Goal LDL cholesterol is less than 70 due to smoking and carotid artery disease.  However I would recommend a high intensity statin due to history of ASCVD.  Will obtain a baseline fasting lipid panel prior to making any change..  Also screen the patient for prostate cancer.

## 2018-10-09 ENCOUNTER — Other Ambulatory Visit: Payer: Self-pay | Admitting: *Deleted

## 2018-10-09 LAB — CBC WITH DIFFERENTIAL/PLATELET
BASOS ABS: 78 {cells}/uL (ref 0–200)
Basophils Relative: 0.7 %
EOS PCT: 1.5 %
Eosinophils Absolute: 167 cells/uL (ref 15–500)
HCT: 43 % (ref 38.5–50.0)
HEMOGLOBIN: 14.8 g/dL (ref 13.2–17.1)
Lymphs Abs: 1643 cells/uL (ref 850–3900)
MCH: 33 pg (ref 27.0–33.0)
MCHC: 34.4 g/dL (ref 32.0–36.0)
MCV: 96 fL (ref 80.0–100.0)
MPV: 9.6 fL (ref 7.5–12.5)
Monocytes Relative: 9.7 %
Neutro Abs: 8136 cells/uL — ABNORMAL HIGH (ref 1500–7800)
Neutrophils Relative %: 73.3 %
Platelets: 268 10*3/uL (ref 140–400)
RBC: 4.48 10*6/uL (ref 4.20–5.80)
RDW: 11.2 % (ref 11.0–15.0)
Total Lymphocyte: 14.8 %
WBC mixed population: 1077 cells/uL — ABNORMAL HIGH (ref 200–950)
WBC: 11.1 10*3/uL — ABNORMAL HIGH (ref 3.8–10.8)

## 2018-10-09 LAB — LIPID PANEL
Cholesterol: 174 mg/dL (ref <200)
HDL: 88 mg/dL (ref >40)
LDL Cholesterol (Calc): 64 mg/dL (calc)
Non-HDL Cholesterol (Calc): 86 mg/dL (calc) (ref <130)
TRIGLYCERIDES: 134 mg/dL (ref <150)
Total CHOL/HDL Ratio: 2 (calc) (ref <5.0)

## 2018-10-09 LAB — COMPLETE METABOLIC PANEL WITH GFR
AG Ratio: 1.9 (calc) (ref 1.0–2.5)
ALBUMIN MSPROF: 4.8 g/dL (ref 3.6–5.1)
ALT: 20 U/L (ref 9–46)
AST: 29 U/L (ref 10–35)
Alkaline phosphatase (APISO): 55 U/L (ref 40–115)
BUN/Creatinine Ratio: 14 (calc) (ref 6–22)
BUN: 20 mg/dL (ref 7–25)
CO2: 23 mmol/L (ref 20–32)
CREATININE: 1.48 mg/dL — AB (ref 0.70–1.18)
Calcium: 9.6 mg/dL (ref 8.6–10.3)
Chloride: 88 mmol/L — ABNORMAL LOW (ref 98–110)
GFR, Est African American: 55 mL/min/{1.73_m2} — ABNORMAL LOW (ref >=60)
GFR, Est Non African American: 47 mL/min/{1.73_m2} — ABNORMAL LOW (ref >=60)
GLOBULIN: 2.5 g/dL (ref 1.9–3.7)
Glucose, Bld: 75 mg/dL (ref 65–99)
Potassium: 5.4 mmol/L — ABNORMAL HIGH (ref 3.5–5.3)
SODIUM: 123 mmol/L — AB (ref 135–146)
Total Bilirubin: 1.1 mg/dL (ref 0.2–1.2)
Total Protein: 7.3 g/dL (ref 6.1–8.1)

## 2018-10-09 LAB — PSA: PSA: 1.4 ng/mL (ref <=4.0)

## 2018-10-09 MED ORDER — ATORVASTATIN CALCIUM 40 MG PO TABS: 40.0000 mg | ORAL_TABLET | Freq: Every day | ORAL | 3 refills | Status: DC

## 2018-10-09 MED ORDER — AMLODIPINE BESYLATE 10 MG PO TABS: 10.0000 mg | ORAL_TABLET | Freq: Every day | ORAL | 3 refills | Status: DC

## 2018-10-19 ENCOUNTER — Other Ambulatory Visit: Payer: Medicare Other

## 2018-10-19 DIAGNOSIS — D72829 Elevated white blood cell count, unspecified: Secondary | ICD-10-CM

## 2018-10-19 DIAGNOSIS — E875 Hyperkalemia: Secondary | ICD-10-CM | POA: Diagnosis not present

## 2018-10-19 LAB — COMPREHENSIVE METABOLIC PANEL
AG RATIO: 1.8 (calc) (ref 1.0–2.5)
ALT: 13 U/L (ref 9–46)
AST: 21 U/L (ref 10–35)
Albumin: 4.5 g/dL (ref 3.6–5.1)
Alkaline phosphatase (APISO): 51 U/L (ref 40–115)
BUN/Creatinine Ratio: 12 (calc) (ref 6–22)
BUN: 19 mg/dL (ref 7–25)
CHLORIDE: 97 mmol/L — AB (ref 98–110)
CO2: 26 mmol/L (ref 20–32)
Calcium: 9.5 mg/dL (ref 8.6–10.3)
Creat: 1.54 mg/dL — ABNORMAL HIGH (ref 0.70–1.18)
GLOBULIN: 2.5 g/dL (ref 1.9–3.7)
GLUCOSE: 109 mg/dL — AB (ref 65–99)
POTASSIUM: 4.5 mmol/L (ref 3.5–5.3)
SODIUM: 131 mmol/L — AB (ref 135–146)
TOTAL PROTEIN: 7 g/dL (ref 6.1–8.1)
Total Bilirubin: 1.2 mg/dL (ref 0.2–1.2)

## 2018-10-19 LAB — CBC WITH DIFFERENTIAL/PLATELET
BASOS ABS: 90 {cells}/uL (ref 0–200)
Basophils Relative: 1.1 %
EOS ABS: 312 {cells}/uL (ref 15–500)
Eosinophils Relative: 3.8 %
HCT: 43.3 % (ref 38.5–50.0)
Hemoglobin: 14.6 g/dL (ref 13.2–17.1)
Lymphs Abs: 1640 cells/uL (ref 850–3900)
MCH: 33 pg (ref 27.0–33.0)
MCHC: 33.7 g/dL (ref 32.0–36.0)
MCV: 98 fL (ref 80.0–100.0)
MONOS PCT: 12.6 %
MPV: 9.1 fL (ref 7.5–12.5)
NEUTROS PCT: 62.5 %
Neutro Abs: 5125 cells/uL (ref 1500–7800)
PLATELETS: 229 10*3/uL (ref 140–400)
RBC: 4.42 10*6/uL (ref 4.20–5.80)
RDW: 11.1 % (ref 11.0–15.0)
TOTAL LYMPHOCYTE: 20 %
WBC mixed population: 1033 cells/uL — ABNORMAL HIGH (ref 200–950)
WBC: 8.2 10*3/uL (ref 3.8–10.8)

## 2018-11-07 ENCOUNTER — Encounter: Payer: Self-pay | Admitting: Family Medicine

## 2018-11-07 ENCOUNTER — Other Ambulatory Visit: Payer: Self-pay

## 2018-11-07 ENCOUNTER — Ambulatory Visit (INDEPENDENT_AMBULATORY_CARE_PROVIDER_SITE_OTHER): Payer: Medicare Other | Admitting: Family Medicine

## 2018-11-07 VITALS — BP 122/68 | HR 80 | Temp 97.9°F | Resp 16 | Ht 67.0 in | Wt 130.0 lb

## 2018-11-07 DIAGNOSIS — I10 Essential (primary) hypertension: Secondary | ICD-10-CM | POA: Diagnosis not present

## 2018-11-07 DIAGNOSIS — Z720 Tobacco use: Secondary | ICD-10-CM | POA: Diagnosis not present

## 2018-11-07 DIAGNOSIS — Z8501 Personal history of malignant neoplasm of esophagus: Secondary | ICD-10-CM

## 2018-11-07 DIAGNOSIS — N183 Chronic kidney disease, stage 3 unspecified: Secondary | ICD-10-CM

## 2018-11-07 NOTE — Assessment & Plan Note (Signed)
Blood pressure is significantly improved.  He will continue his current regimen.  Reviewed his recent labs with him at the bedside.  He will continue the Lipitor as well for his cholesterol.  I am to get him reestablished with gastroenterology with his history of esophageal cancer not sure if he is due for another surveillance EGD or not.

## 2018-11-07 NOTE — Patient Instructions (Addendum)
Referral back to GI for cancer follow up Blood pressure looks good  F/U 6 months for Physical

## 2018-11-07 NOTE — Assessment & Plan Note (Signed)
Was counseled on tobacco cessation but he is not ready to quit.

## 2018-11-07 NOTE — Progress Notes (Signed)
   Subjective:    Patient ID: Eddie [name], male    DOB: 25-Jul-1946, 72 y.o.   MRN: [id number]  Patient presents for Follow-up (is not fasting)  Pt here to f/u hypertension.  He was seen a few weeks ago at that time his blood pressure was quite elevated he was on hydrochlorothiazide lisinopril and terra Zosyn.  His lisinopril was increased to 40 mg however his potassium went up therefore this was discontinued he was placed on amlodipine.  Has not had any difficulty with the amlodipine.  He does have chronic kidney disease his baseline creatinine is about 1.4-1.5  Lipid panel was normal however he was changed to Lipitor secondary to interaction with the simvastatin being on amlodipine. That he is doing well.  He does have history of esophageal cancer he has not had follow-up with oncology or GI in a couple of years.  Seems that he missed his appointment last year for his annual follow-up with GI.  He denies any difficulty swallowing or eating.  He has been maintaining his weight.  He is starting to drink some Ensure as well  Review Of Systems:  GEN- denies fatigue, fever, weight loss,weakness, recent illness HEENT- denies eye drainage, change in vision, nasal discharge, CVS- denies chest pain, palpitations RESP- denies SOB, cough, wheeze ABD- denies N/V, change in stools, abd pain GU- denies dysuria, hematuria, dribbling, incontinence MSK- denies joint pain, muscle aches, injury Neuro- denies headache, dizziness, syncope, seizure activity       Objective:    BP 122/68   Pulse 80   Temp 97.9 F (36.6 C) (Oral)   Resp 16   Ht 5\' 7"  (1.702 m)   Wt 130 lb (59 kg)   SpO2 97%   BMI 20.36 kg/m  GEN- NAD, alert and oriented x3 HEENT- PERRL, EOMI, non injected sclera, pink conjunctiva, MMM, oropharynx clear Neck- Supple, no thyromegaly CVS- RRR, no murmur RESP-CTAB ABD-NABS,soft,NT,ND EXT- No edema Pulses- Radial 2+        Assessment & Plan:      Problem List Items Addressed  This Visit      Unprioritized   CKD (chronic kidney disease) stage 3, GFR 30-59 ml/min (HCC) - Primary   Essential hypertension, benign    Blood pressure is significantly improved.  He will continue his current regimen.  Reviewed his recent labs with him at the bedside.  He will continue the Lipitor as well for his cholesterol.  I am to get him reestablished with gastroenterology with his history of esophageal cancer not sure if he is due for another surveillance EGD or not.      History of esophageal cancer   Tobacco use    Was counseled on tobacco cessation but he is not ready to quit.         Note: This dictation was prepared with Dragon dictation along with smaller phrase technology. Any transcriptional errors that result from this process are unintentional.

## 2018-11-12 ENCOUNTER — Other Ambulatory Visit: Payer: Self-pay | Admitting: *Deleted

## 2018-11-12 MED ORDER — ATORVASTATIN CALCIUM 40 MG PO TABS: 40.0000 mg | ORAL_TABLET | Freq: Every day | ORAL | 3 refills | Status: DC

## 2018-11-12 MED ORDER — TERAZOSIN HCL 2 MG PO CAPS: 2.0000 mg | ORAL_CAPSULE | Freq: Every day | ORAL | 3 refills | Status: DC

## 2018-11-12 MED ORDER — HYDROCHLOROTHIAZIDE 25 MG PO TABS: 25.0000 mg | ORAL_TABLET | Freq: Every day | ORAL | 3 refills | Status: DC

## 2018-11-12 MED ORDER — AMLODIPINE BESYLATE 10 MG PO TABS: 10.0000 mg | ORAL_TABLET | Freq: Every day | ORAL | 3 refills | Status: DC

## 2018-11-20 ENCOUNTER — Ambulatory Visit (INDEPENDENT_AMBULATORY_CARE_PROVIDER_SITE_OTHER): Payer: Self-pay | Admitting: Internal Medicine

## 2018-12-04 ENCOUNTER — Encounter (INDEPENDENT_AMBULATORY_CARE_PROVIDER_SITE_OTHER): Payer: Self-pay | Admitting: *Deleted

## 2018-12-10 ENCOUNTER — Ambulatory Visit (INDEPENDENT_AMBULATORY_CARE_PROVIDER_SITE_OTHER): Payer: Self-pay | Admitting: Internal Medicine

## 2018-12-13 ENCOUNTER — Telehealth (INDEPENDENT_AMBULATORY_CARE_PROVIDER_SITE_OTHER): Payer: Self-pay | Admitting: *Deleted

## 2018-12-13 ENCOUNTER — Encounter (INDEPENDENT_AMBULATORY_CARE_PROVIDER_SITE_OTHER): Payer: Self-pay | Admitting: *Deleted

## 2018-12-13 ENCOUNTER — Encounter (INDEPENDENT_AMBULATORY_CARE_PROVIDER_SITE_OTHER): Payer: Self-pay | Admitting: Internal Medicine

## 2018-12-13 ENCOUNTER — Ambulatory Visit (INDEPENDENT_AMBULATORY_CARE_PROVIDER_SITE_OTHER): Payer: Medicare Other | Admitting: Internal Medicine

## 2018-12-13 VITALS — BP 130/80 | HR 78 | Temp 98.1°F | Ht 67.0 in | Wt 128.2 lb

## 2018-12-13 DIAGNOSIS — Z860100 Personal history of colon polyps, unspecified: Secondary | ICD-10-CM | POA: Insufficient documentation

## 2018-12-13 DIAGNOSIS — C159 Malignant neoplasm of esophagus, unspecified: Secondary | ICD-10-CM | POA: Diagnosis not present

## 2018-12-13 MED ORDER — PEG 3350-KCL-NA BICARB-NACL 420 G PO SOLR: 4000.0000 mL | Freq: Once | ORAL | 0 refills | Status: AC

## 2018-12-13 NOTE — Progress Notes (Signed)
Subjective:    Patient ID: Eddie [name], male    DOB: [birth year], 73 y.o.   MRN: [id number]  HPI Here today for f/u. Last seen in our office in August of 2017.  Hx of squamous cell carcinoma of the esophagus, diagnosed in October of 2013. Treatment completed in January of 2014 utilizing continuous infusion 5-fluorouracil in conjunction with external beam radiotherapy. Appetite is good. He has lost about 3 pounds since his last visit in 2017. BMs are normal. He denies any dysphagia.    12/26/2013 Colonoscopy: Dr. Laural Golden. Routine screening: Impression:  Examination performed to cecum.  8 mm pedunculated polyp snared from distal sigmoid colon.  Small external hemorrhoids.   Notes Recorded by Rogene Houston, MD on 12/29/2013 at 7:24 PM Polyp is a tubular adenoma. Size 8 mm. Results reviewed with patient Next TCS in 5 years  Review of Systems  Past Medical History:  Diagnosis Date  . Chronic back pain   . Duodenal ulcer 09/2012  . GERD (gastroesophageal reflux disease)   . Helicobacter pylori (H. pylori) infection 09/2012  . Hypertension   . Severe malnutrition ([place]) 10/26/2012  . 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 Squamous cell carcinoma of esophagus ([place]) 09/25/2012    Past Surgical History:  Procedure Laterality Date  . COLONOSCOPY N/A 12/26/2013   Procedure: COLONOSCOPY;  Surgeon: Rogene Houston, MD;  [facility];  Service: Endoscopy;  Laterality: N/A;  830  . ESOPHAGOGASTRODUODENOSCOPY  09/21/2012   Procedure: ESOPHAGOGASTRODUODENOSCOPY (EGD);  Surgeon: Rogene Houston, MD;  [facility];  Service: Endoscopy;  Laterality: N/A;  345  . PEG PLACEMENT  09/21/2012   Procedure: PERCUTANEOUS ENDOSCOPIC GASTROSTOMY (PEG) PLACEMENT;  Surgeon: Rogene Houston, MD;  [facility];  Service: Endoscopy;  Laterality: N/A;  . PEG TUBE PLACEMENT  09/21/2012  . PORTACATH PLACEMENT    . REMOVAL OF GASTROSTOMY TUBE     august 2014     Allergies  Allergen Reactions  . Motrin [Ibuprofen] Swelling    Current Outpatient Medications on File Prior to Visit  Medication Sig Dispense Refill  . amLODipine (NORVASC) 10 MG tablet Take 1 tablet (10 mg total) by mouth daily. 90 tablet 3  . atorvastatin (LIPITOR) 40 MG tablet Take 1 tablet (40 mg total) by mouth daily. 90 tablet 3  . Cyanocobalamin (VITAMIN B-12 PO) Take 1 tablet by mouth daily.    . hydrochlorothiazide (HYDRODIURIL) 25 MG tablet Take 1 tablet (25 mg total) by mouth daily. 90 tablet 3  . terazosin (HYTRIN) 2 MG capsule Take 1 capsule (2 mg total) by mouth at bedtime. 90 capsule 3   No current facility-administered medications on file prior to visit.        Allergies  Allergen Reactions  . Motrin [Ibuprofen] Swelling          Objective:   Physical Exam Blood pressure 130/80, pulse 78, temperature 98.1 F (36.7 C), height 5\' 7"  (1.702 m), weight 128 lb 3.2 oz (58.2 kg). Alert and oriented. Skin warm and dry. Oral mucosa is moist.   . Sclera anicteric, conjunctivae is pink. Thyroid not enlarged. No cervical lymphadenopathy. Lungs clear. Heart regular rate and rhythm.  Abdomen is soft. Bowel sounds are positive. No hepatomegaly. No abdominal masses felt. No tenderness.  No edema to lower extremities.           Assessment & Plan:  Esophageal CA. He is dong well. He has no complaints. No  dysphagia. He will have OV in 1 year.  I discuss with Dr Laural Golden.  He does not need a repeat EGD at this time.  Colon polyp: Surveillance colonoscopy

## 2018-12-13 NOTE — Telephone Encounter (Signed)
Patient needs trilyte 

## 2018-12-13 NOTE — Patient Instructions (Signed)
OV in 1 year.  

## 2019-01-15 ENCOUNTER — Telehealth (INDEPENDENT_AMBULATORY_CARE_PROVIDER_SITE_OTHER): Payer: Self-pay | Admitting: *Deleted

## 2019-01-15 NOTE — Telephone Encounter (Signed)
Noted  

## 2019-01-15 NOTE — Telephone Encounter (Signed)
Patient called watns to cancel TCS for 01/17/19 says he has other stuff going on right now

## 2019-01-17 ENCOUNTER — Ambulatory Visit (HOSPITAL_COMMUNITY): Admission: RE | Admit: 2019-01-17 | Payer: Medicare Other | Source: Home / Self Care | Admitting: Internal Medicine

## 2019-01-17 ENCOUNTER — Encounter (HOSPITAL_COMMUNITY): Admission: RE | Payer: Self-pay | Source: Home / Self Care

## 2019-01-17 SURGERY — COLONOSCOPY
Anesthesia: Moderate Sedation

## 2019-05-13 ENCOUNTER — Other Ambulatory Visit: Payer: Self-pay

## 2019-05-13 ENCOUNTER — Ambulatory Visit (INDEPENDENT_AMBULATORY_CARE_PROVIDER_SITE_OTHER): Payer: Medicare Other | Admitting: Family Medicine

## 2019-05-13 VITALS — BP 110/62 | HR 87 | Temp 98.0°F | Resp 18 | Ht 67.0 in | Wt 130.6 lb

## 2019-05-13 DIAGNOSIS — Z1159 Encounter for screening for other viral diseases: Secondary | ICD-10-CM | POA: Diagnosis not present

## 2019-05-13 DIAGNOSIS — Z8501 Personal history of malignant neoplasm of esophagus: Secondary | ICD-10-CM | POA: Diagnosis not present

## 2019-05-13 DIAGNOSIS — N183 Chronic kidney disease, stage 3 unspecified: Secondary | ICD-10-CM

## 2019-05-13 DIAGNOSIS — I10 Essential (primary) hypertension: Secondary | ICD-10-CM | POA: Diagnosis not present

## 2019-05-13 DIAGNOSIS — Z114 Encounter for screening for human immunodeficiency virus [HIV]: Secondary | ICD-10-CM

## 2019-05-13 DIAGNOSIS — Z0001 Encounter for general adult medical examination with abnormal findings: Secondary | ICD-10-CM | POA: Diagnosis not present

## 2019-05-13 DIAGNOSIS — Z0000 Encounter for general adult medical examination without abnormal findings: Secondary | ICD-10-CM

## 2019-05-13 DIAGNOSIS — Z125 Encounter for screening for malignant neoplasm of prostate: Secondary | ICD-10-CM

## 2019-05-13 NOTE — Patient Instructions (Addendum)
Decrease alcohol to 2 beers a night  F/u 6 MONTHS

## 2019-05-13 NOTE — Progress Notes (Signed)
Subjective:   Patient presents for Medicare Annual/Subsequent preventive examination.  EatING  smaller amounts, but throiughout the day   Drinks ensure daily  No concerns   HTN- taking norvasc and hytrin BPH- taking hytrin, no difficulty with urine stream Hyperlipidemia- taking lipitor without difficulty   Colonoscopy to be rescheduled due to COVID-19   Review Past Medical/Family/Social: Per EMR   Risk Factors  Current exercise habits: walks some  Dietary issues discussed: Yes  Cardiac risk factors: Obesity (BMI >= 30 kg/m2).   Depression Screen  (Note: if answer to either of the following is "Yes", a more complete depression screening is indicated)  Over the past two weeks, have you felt down, depressed or hopeless? No Over the past two weeks, have you felt little interest or pleasure in doing things? No Have you lost interest or pleasure in daily life? No Do you often feel hopeless? No Do you cry easily over simple problems? No   Activities of Daily Living  In your present state of health, do you have any difficulty performing the following activities?:  Driving? No  Managing money? No  Feeding yourself? No  Getting from bed to chair? No  Climbing a flight of stairs? No  Preparing food and eating?: No  Bathing or showering? No  Getting dressed: No  Getting to the toilet? No  Using the toilet:No  Moving around from place to place: No  In the past year have you fallen or had a near fall?:No  Are you sexually active? No  Do you have more than one partner? No   Hearing Difficulties: No  Do you often ask people to speak up or repeat themselves? No  Do you experience ringing or noises in your ears? No Do you have difficulty understanding soft or whispered voices? No  Do you feel that you have a problem with memory? No Do you often misplace items? No  Do you feel safe at home? Yes  Cognitive Testing  Alert? Yes Normal Appearance?Yes  Oriented to person? Yes Place?  Yes  Time? Yes  Recall of three objects? Yes  Can perform simple calculations? Yes  Displays appropriate judgment?Yes  Can read the correct time from a watch face?Yes   List the Names of Other Physician/Practitioners you currently use:   Dr. Laural Golden    Screening Tests / Date Colonoscopy  dUE                     Pneumonia vaccines- UTD  Shingles- Declines  Tetanus/tdap dECLINES   ROS: GEN- denies fatigue, fever, weight loss,weakness, recent illness HEENT- denies eye drainage, change in vision, nasal discharge, CVS- denies chest pain, palpitations RESP- denies SOB, cough, wheeze ABD- denies N/V, change in stools, abd pain GU- denies dysuria, hematuria, dribbling, incontinence MSK- denies joint pain, muscle aches, injury Neuro- denies headache, dizziness, syncope, seizure activity  Physical: Vitals reviewed  GEN- NAD, alert and oriented x3 HEENT- PERRL, EOMI, non injected sclera, pink conjunctiva, MMM, oropharynx clear Neck- Supple, no thryomegaly CVS- RRR, no murmur RESP-CTAB ABD-NABS,soft,nt,nd EXT- No edema Pulses- Radial, DP- 2+   Assessment:    Annual wellness medicare exam   Plan:    During the course of the visit the patient was educated and counseled about appropriate screening and preventive services including:  Declines shingles tdap  PSA screening to be done for prostate cancer   Colon cancer screening he will contact gastroenterology and reschedule his colonoscopy  Chronic kidney disease recheck his renal  function.  History of esophageal carcinoma he is done well with regards to any dysphasia.  He is maintaining his weight for the most part.  Do recommend that he cut back on alcohol use as this is affecting his overall nutrition.  Discussed cutting his alcohol back to 2 beers a night he is currently drinking 3 or 4 a night  No recent falls negative depression screening  Hypertension blood pressure is controlled Diet review for nutrition referral? Yes  ____ Not Indicated __x__  Patient Instructions (the written plan) was given to the patient.  Medicare Attestation  I have personally reviewed:  The patient's medical and social history  Their use of alcohol, tobacco or illicit drugs  Their current medications and supplements  The patient's functional ability including ADLs,fall risks, home safety risks, cognitive, and hearing and visual impairment  Diet and physical activities  Evidence for depression or mood disorders  The patient's weight, height, BMI, and visual acuity have been recorded in the chart. I have made referrals, counseling, and provided education to the patient based on review of the above and I have provided the patient with a written personalized care plan for preventive services.

## 2019-05-14 ENCOUNTER — Encounter: Payer: Self-pay | Admitting: Family Medicine

## 2019-05-14 LAB — LIPID PANEL
Cholesterol: 161 mg/dL (ref <200)
HDL: 98 mg/dL (ref >=40)
LDL Cholesterol (Calc): 45 mg/dL (calc)
Non-HDL Cholesterol (Calc): 63 mg/dL (calc) (ref <130)
Total CHOL/HDL Ratio: 1.6 (calc) (ref <5.0)
Triglycerides: 96 mg/dL (ref <150)

## 2019-05-14 LAB — CBC WITH DIFFERENTIAL/PLATELET
Absolute Monocytes: 877 cells/uL (ref 200–950)
Basophils Absolute: 82 cells/uL (ref 0–200)
Basophils Relative: 0.8 %
Eosinophils Absolute: 122 cells/uL (ref 15–500)
Eosinophils Relative: 1.2 %
HCT: 42.1 % (ref 38.5–50.0)
Hemoglobin: 14.4 g/dL (ref 13.2–17.1)
Lymphs Abs: 1397 cells/uL (ref 850–3900)
MCH: 34.2 pg — ABNORMAL HIGH (ref 27.0–33.0)
MCHC: 34.2 g/dL (ref 32.0–36.0)
MCV: 100 fL (ref 80.0–100.0)
MPV: 9.5 fL (ref 7.5–12.5)
Monocytes Relative: 8.6 %
Neutro Abs: 7721 cells/uL (ref 1500–7800)
Neutrophils Relative %: 75.7 %
Platelets: 207 10*3/uL (ref 140–400)
RBC: 4.21 10*6/uL (ref 4.20–5.80)
RDW: 11.7 % (ref 11.0–15.0)
Total Lymphocyte: 13.7 %
WBC: 10.2 10*3/uL (ref 3.8–10.8)

## 2019-05-14 LAB — COMPREHENSIVE METABOLIC PANEL
AG Ratio: 1.9 (calc) (ref 1.0–2.5)
ALT: 17 U/L (ref 9–46)
AST: 27 U/L (ref 10–35)
Albumin: 4.6 g/dL (ref 3.6–5.1)
Alkaline phosphatase (APISO): 59 U/L (ref 35–144)
BUN/Creatinine Ratio: 11 (calc) (ref 6–22)
BUN: 15 mg/dL (ref 7–25)
CO2: 25 mmol/L (ref 20–32)
Calcium: 9.5 mg/dL (ref 8.6–10.3)
Chloride: 92 mmol/L — ABNORMAL LOW (ref 98–110)
Creat: 1.35 mg/dL — ABNORMAL HIGH (ref 0.70–1.18)
Globulin: 2.4 g/dL (calc) (ref 1.9–3.7)
Glucose, Bld: 86 mg/dL (ref 65–99)
Potassium: 4.7 mmol/L (ref 3.5–5.3)
Sodium: 129 mmol/L — ABNORMAL LOW (ref 135–146)
Total Bilirubin: 1 mg/dL (ref 0.2–1.2)
Total Protein: 7 g/dL (ref 6.1–8.1)

## 2019-05-14 LAB — HEPATITIS C ANTIBODY
Hepatitis C Ab: NONREACTIVE
SIGNAL TO CUT-OFF: 0.04 (ref <1.00)

## 2019-05-14 LAB — PSA: PSA: 1.8 ng/mL (ref <=4.0)

## 2019-05-14 LAB — HIV ANTIBODY (ROUTINE TESTING W REFLEX): HIV 1&2 Ab, 4th Generation: NONREACTIVE

## 2019-09-13 ENCOUNTER — Ambulatory Visit (INDEPENDENT_AMBULATORY_CARE_PROVIDER_SITE_OTHER): Payer: Medicare Other | Admitting: Family Medicine

## 2019-09-13 DIAGNOSIS — J069 Acute upper respiratory infection, unspecified: Secondary | ICD-10-CM

## 2019-09-13 NOTE — Progress Notes (Signed)
Subjective:    Patient ID: Eddie [name], male    DOB: 19-Dec-1945, 73 y.o.   MRN: [id number]  HPI Patient is a 73 year old African-American gentleman who is being seen today as a telephone visit.  Phone call began at 320.  Phone call concluded at 330.  Patient states that symptoms began Monday.  He believes that he has a head cold.  Symptoms include both ears feeling stopped up with decreased hearing.  He also complains of runny nose and head congestion.  He denies any sore throat.  He denies any sinus pain.  He denies any cough.  He denies any chest pain.  He denies any shortness of breath.  His symptoms are primarily limited to rhinorrhea and head congestion.  He denies any fever.  He denies any chills.  He has no known sick contacts.  He lives alone and he is currently retired.  Phone call was performed at 3:30 in the afternoon and therefore he is passed the time I can get him tested for COVID-19 today.  Next available testing will be on Monday. Carmon Ginsberg Past Surgical History:  Procedure Laterality Date  . COLONOSCOPY N/A 12/26/2013   Procedure: COLONOSCOPY;  Surgeon: Rogene Houston, MD;  [facility];  Service: Endoscopy;  Laterality: N/A;  830  . ESOPHAGOGASTRODUODENOSCOPY  09/21/2012   Procedure: ESOPHAGOGASTRODUODENOSCOPY (EGD);  Surgeon: Rogene Houston, MD;  [facility];  Service: Endoscopy;  Laterality: N/A;  345  . PEG PLACEMENT  09/21/2012   Procedure: PERCUTANEOUS ENDOSCOPIC GASTROSTOMY (PEG) PLACEMENT;  Surgeon: Rogene Houston, MD;  [facility];  Service: Endoscopy;  Laterality: N/A;  . PEG TUBE PLACEMENT  09/21/2012  . PORTACATH PLACEMENT    . REMOVAL OF GASTROSTOMY TUBE     august 2014   Current Outpatient Medications on File Prior to Visit  Medication Sig Dispense Refill  . amLODipine (NORVASC) 10 MG tablet Take 1 tablet (10 mg total) by mouth daily. 90 tablet 3  . atorvastatin (LIPITOR) 40 MG tablet Take 1 tablet (40 mg total) by mouth daily. 90  tablet 3  . Cyanocobalamin (VITAMIN B-12 PO) Take 1 tablet by mouth daily.    . hydrochlorothiazide (HYDRODIURIL) 25 MG tablet Take 1 tablet (25 mg total) by mouth daily. 90 tablet 3  . terazosin (HYTRIN) 2 MG capsule Take 1 capsule (2 mg total) by mouth at bedtime. 90 capsule 3   No current facility-administered medications on file prior to visit.    Allergies  Allergen Reactions  . Motrin [Ibuprofen] Swelling   Social History   Socioeconomic History  . Marital status: Married    Spouse name: Not on file  . Number of children: Not on file  . Years of education: Not on file  . Highest education level: Not on file  Occupational History  . Not on file  Social Needs  . Financial resource strain: Not on file  . Food insecurity    Worry: Not on file    Inability: Not on file  . Transportation needs    Medical: Not on file    Non-medical: Not on file  Tobacco Use  . Smoking status: Current Every Day Smoker    Packs/day: 1.00    Years: 50.00    Pack years: 50.00    Types: Cigarettes  . Smokeless tobacco: Never Used  . Tobacco comment: 1/2 to 1 pack x50 yrs.  Substance and Sexual Activity  . Alcohol use: Yes  Alcohol/week: 6.0 standard drinks    Types: 6 Cans of beer per week    Comment: twice a week  . Drug use: No  . Sexual activity: Not on file  Lifestyle  . Physical activity    Days per week: Not on file    Minutes per session: Not on file  . Stress: Not on file  Relationships  . Social Herbalist on phone: Not on file    Gets together: Not on file    Attends religious service: Not on file    Active member of club or organization: Not on file    Attends meetings of clubs or organizations: Not on file    Relationship status: Not on file  . Intimate partner violence    Fear of current or ex partner: Not on file    Emotionally abused: Not on file    Physically abused: Not on file    Forced sexual activity: Not on file  Other Topics Concern  . Not on  file  Social History Narrative  . Not on file      Review of Systems  All other systems reviewed and are negative.      Objective:   Physical Exam   Physical exam could not be performed today as patient was seen as a telephone visit.  However he is speaking full and complete sentences with no difficulty breathing.     Assessment & Plan:  URI, acute  Symptoms are consistent with a viral upper respiratory infection.  I recommended Coricidin HBP due to his history of hypertension for head congestion and rhinorrhea.  I also recommended Mucinex for congestion as well.  He can use Tylenol for any aches or pains that he has.  If symptoms persist into Monday or if they worsen he needs to be tested for COVID-19.  If he develops any shortness of breath or chest pain over the weekend he is to go to the emergency room.  Otherwise I recommend that he quarantine at home.

## 2019-09-21 ENCOUNTER — Emergency Department (HOSPITAL_COMMUNITY)
Admission: EM | Admit: 2019-09-21 | Discharge: 2019-09-21 | Disposition: A | Discharge status: Home / Self Care | Payer: Medicare Other | Attending: Emergency Medicine | Admitting: Emergency Medicine

## 2019-09-21 ENCOUNTER — Encounter (HOSPITAL_COMMUNITY): Payer: Self-pay | Admitting: Emergency Medicine

## 2019-09-21 ENCOUNTER — Other Ambulatory Visit: Payer: Self-pay

## 2019-09-21 DIAGNOSIS — N183 Chronic kidney disease, stage 3 unspecified: Secondary | ICD-10-CM | POA: Insufficient documentation

## 2019-09-21 DIAGNOSIS — Z79899 Other long term (current) drug therapy: Secondary | ICD-10-CM | POA: Insufficient documentation

## 2019-09-21 DIAGNOSIS — R21 Rash and other nonspecific skin eruption: Secondary | ICD-10-CM | POA: Diagnosis present

## 2019-09-21 DIAGNOSIS — F17210 Nicotine dependence, cigarettes, uncomplicated: Secondary | ICD-10-CM | POA: Insufficient documentation

## 2019-09-21 DIAGNOSIS — I2510 Atherosclerotic heart disease of native coronary artery without angina pectoris: Secondary | ICD-10-CM | POA: Diagnosis not present

## 2019-09-21 DIAGNOSIS — B029 Zoster without complications: Secondary | ICD-10-CM

## 2019-09-21 DIAGNOSIS — I129 Hypertensive chronic kidney disease with stage 1 through stage 4 chronic kidney disease, or unspecified chronic kidney disease: Secondary | ICD-10-CM | POA: Insufficient documentation

## 2019-09-21 MED ORDER — VALACYCLOVIR HCL 1 G PO TABS: 1000.0000 mg | ORAL_TABLET | Freq: Three times a day (TID) | ORAL | 0 refills | Status: DC

## 2019-09-21 MED ORDER — VALACYCLOVIR HCL 500 MG PO TABS
1000.0000 mg | ORAL_TABLET | Freq: Once | ORAL | Status: AC
  Administered 2019-09-21: 18:00:00 1000 mg via ORAL
  Filled 2019-09-21: qty 2

## 2019-09-21 MED ORDER — VALACYCLOVIR HCL 500 MG PO TABS: 500.0000 mg | ORAL_TABLET | Freq: Once | ORAL | Status: DC

## 2019-09-21 NOTE — ED Provider Notes (Signed)
Medical screening examination/treatment/procedure(s) were conducted as a shared visit with non-physician practitioner(s) and myself.  I personally evaluated the patient during the encounter.      Patient seen by me along with physician assistant.  The patient got flu vaccination about 2 weeks ago.  Started develop rash that is in a dermatomal position on the left side 2 days ago.  Rash is vesicular in nature.  Very consistent with herpes zoster.  Patient states that it hurts and burns.  Dermatome seems to be consistent with the T8 level.  Patient will be treated appropriate antiviral.   Fredia Sorrow, MD 09/21/19 1743

## 2019-09-21 NOTE — Discharge Instructions (Addendum)
It is important to take the medication as directed until its finished.  Try to avoid contact with possible pregnant females for people who may be immunocompromised.  Follow-up with your primary doctor for recheck, return to the ER for any worsening symptoms.

## 2019-09-21 NOTE — ED Triage Notes (Signed)
Per patient states he got influenza vaccination 2 weeks ago and started feeling bad. States rash appeared 2 days ago. States rash is burns and hurts and is unable to rest on rash that extends from front left chest around to back of left chest.

## 2019-09-21 NOTE — ED Provider Notes (Signed)
[facility] EMERGENCY DEPARTMENT Provider Note   CSN: [id number] Arrival date & time: 09/21/19  1526     History   Chief Complaint Chief Complaint  Patient presents with  . Generalized Body Aches  . Rash    HPI Eddie [name] is a 73 y.o. male.     HPI   Eddie [name] is a 73 y.o. male who presents to the Emergency Department complaining of a painful rash to his left chest and back.  Rash has been present for 3 days.  He states that he received a high dose influenza shot 2 weeks ago and began experiencing generalized fatigue and malaise shortly afterward.  He also reports noticing a pain and tingling to his left chest within a few days.  He describes having blisters to his chest that have now spread around to his left back.  Nothing makes his symptoms better or worse.  He denies shortness of breath, fever, chills, nausea or vomiting, numbness or weakness of his extremities.  He denies known COVID exposures.   Past Medical History:  Diagnosis Date  . Chronic back pain   . Duodenal ulcer 09/2012  . GERD (gastroesophageal reflux disease)   . Helicobacter pylori (H. pylori) infection 09/2012  . Hypertension   . Severe malnutrition ([place]) 10/26/2012  . 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 Squamous cell carcinoma of esophagus ([place]) 09/25/2012    Patient Active Problem List   Diagnosis Date Noted  . History of colonic polyps 12/13/2018  . Hyponatremia 03/11/2016  . Carotid artery disease ([place]) 01/22/2016  . Wheezing 07/21/2015  . History of esophageal cancer 07/15/2014  . Routine general medical examination at a health care facility 07/02/2014  . CKD (chronic kidney disease) stage 3, GFR 30-59 ml/min (HCC) 07/02/2014  . Adenoma of large intestine 01/27/2014  . Seasonal allergies 04/03/2013  . Duodenal ulcer 10/26/2012  . Edentulous 10/02/2012  . Squamous cell carcinoma of esophagus ([place]) 09/25/2012  . Essential hypertension, benign 07/29/2012  . Alcohol use  07/29/2012  . Pulmonary emphysema ([place]) 07/29/2012  . Tobacco use 07/29/2012    Past Surgical History:  Procedure Laterality Date  . COLONOSCOPY N/A 12/26/2013   Procedure: COLONOSCOPY;  Surgeon: Rogene Houston, MD;  [facility];  Service: Endoscopy;  Laterality: N/A;  830  . ESOPHAGOGASTRODUODENOSCOPY  09/21/2012   Procedure: ESOPHAGOGASTRODUODENOSCOPY (EGD);  Surgeon: Rogene Houston, MD;  [facility];  Service: Endoscopy;  Laterality: N/A;  345  . PEG PLACEMENT  09/21/2012   Procedure: PERCUTANEOUS ENDOSCOPIC GASTROSTOMY (PEG) PLACEMENT;  Surgeon: Rogene Houston, MD;  [facility];  Service: Endoscopy;  Laterality: N/A;  . PEG TUBE PLACEMENT  09/21/2012  . PORTACATH PLACEMENT    . REMOVAL OF GASTROSTOMY TUBE     august 2014        Home Medications    Prior to Admission medications   Medication Sig Start Date End Date Taking? Authorizing Provider  amLODipine (NORVASC) 10 MG tablet Take 1 tablet (10 mg total) by mouth daily. 11/12/18   [name], MD  atorvastatin (LIPITOR) 40 MG tablet Take 1 tablet (40 mg total) by mouth daily. 11/12/18   [place], Modena Nunnery, MD  Cyanocobalamin (VITAMIN B-12 PO) Take 1 tablet by mouth daily.    [provider]  hydrochlorothiazide (HYDRODIURIL) 25 MG tablet Take 1 tablet (25 mg total) by mouth daily. 11/12/18   [name], MD  terazosin (HYTRIN) 2 MG  capsule Take 1 capsule (2 mg total) by mouth at bedtime. 11/12/18   [name], MD    Family History Family History  Problem Relation Age of Onset  . Stroke Mother   . Hypertension Father   . Cancer Brother        Throat cancer  . Kidney disease Daughter     Social History Social History   Tobacco Use  . Smoking status: Current Every Day Smoker    Packs/day: 1.00    Years: 50.00    Pack years: 50.00    Types: Cigarettes  . Smokeless tobacco: Never Used  . Tobacco comment: 1/2 to 1 pack x50 yrs.  Substance Use Topics  .  Alcohol use: Yes    Alcohol/week: 6.0 standard drinks    Types: 6 Cans of beer per week    Comment: twice a week  . Drug use: No     Allergies   Motrin [ibuprofen]   Review of Systems Review of Systems  Constitutional: Positive for fatigue. Negative for activity change, appetite change, chills and fever.  HENT: Negative for facial swelling, sore throat and trouble swallowing.   Respiratory: Negative for chest tightness, shortness of breath and wheezing.   Cardiovascular: Positive for chest pain (chest wall pain). Negative for leg swelling.  Gastrointestinal: Negative for abdominal pain, diarrhea, nausea and vomiting.  Musculoskeletal: Positive for myalgias. Negative for neck pain and neck stiffness.  Skin: Positive for rash. Negative for wound.  Neurological: Negative for dizziness, weakness, numbness and headaches.     Physical Exam Updated Vital Signs BP (!) 136/93 (BP Location: Right Arm)   Pulse 88   Temp 99 F (37.2 C) (Oral)   Resp 16   Ht 5\' 7"  (1.702 m)   Wt 58.1 kg   SpO2 99%   BMI 20.05 kg/m   Physical Exam Vitals signs and nursing note reviewed.  Constitutional:      General: He is not in acute distress.    Appearance: He is well-developed. He is not ill-appearing or toxic-appearing.  HENT:     Head: Normocephalic and atraumatic.     Mouth/Throat:     Mouth: Mucous membranes are moist.  Neck:     Musculoskeletal: Normal range of motion and neck supple.  Cardiovascular:     Rate and Rhythm: Normal rate and regular rhythm.     Heart sounds: Normal heart sounds. No murmur.  Pulmonary:     Effort: Pulmonary effort is normal. No respiratory distress.     Breath sounds: Normal breath sounds.  Chest:     Chest wall: No tenderness.  Abdominal:     General: There is no distension.     Palpations: Abdomen is soft.     Tenderness: There is no abdominal tenderness. There is no guarding.  Musculoskeletal:        General: No tenderness.     Right lower leg:  No edema.     Left lower leg: No edema.  Lymphadenopathy:     Cervical: No cervical adenopathy.  Skin:    General: Skin is warm.     Capillary Refill: Capillary refill takes less than 2 seconds.     Findings: Rash present. No erythema.     Comments: Patient with vesicular, mildly erythematous rash of the left chest and back that appears consistent with zoster at the T8 level. No surrounding edema.  No pustules.   Neurological:     Mental Status: He is alert and oriented to  person, place, and time.     Sensory: No sensory deficit.     Motor: No weakness or abnormal muscle tone.      ED Treatments / Results  Labs (all labs ordered are listed, but only abnormal results are displayed) Labs Reviewed - No data to display  EKG None  Radiology No results found.  Procedures Procedures (including critical care time)  Medications Ordered in ED Medications  valACYclovir (VALTREX) tablet 1,000 mg (has no administration in time range)     Initial Impression / Assessment and Plan / ED Course  I have reviewed the triage vital signs and the nursing notes.  Pertinent labs & imaging results that were available during my care of the patient were reviewed by me and considered in my medical decision making (see chart for details).        Pt with rash to left chest and back that appears c/w zoster. Pt is well appearing, non-toxic appearing.  Vitals reviewed.   Pt also seen by Dr. Rogene Houston.  Pt agrees to tx plan with anti-viral and close out pt f/u.  Return precautions discussed.   Final Clinical Impressions(s) / ED Diagnoses   Final diagnoses:  Herpes zoster without complication    ED Discharge Orders    None       Kem Parkinson, PA-C 09/22/19 1812    Fredia Sorrow, MD 10/01/19 [phone]

## 2019-09-27 ENCOUNTER — Encounter: Payer: Self-pay | Admitting: Family Medicine

## 2019-09-27 ENCOUNTER — Other Ambulatory Visit: Payer: Self-pay

## 2019-09-27 ENCOUNTER — Ambulatory Visit (INDEPENDENT_AMBULATORY_CARE_PROVIDER_SITE_OTHER): Payer: Medicare Other | Admitting: Family Medicine

## 2019-09-27 VITALS — BP 138/66 | HR 100 | Temp 98.1°F | Resp 16 | Ht 67.0 in | Wt 128.0 lb

## 2019-09-27 DIAGNOSIS — B029 Zoster without complications: Secondary | ICD-10-CM | POA: Diagnosis not present

## 2019-09-27 MED ORDER — GABAPENTIN 300 MG PO CAPS: 300.0000 mg | ORAL_CAPSULE | Freq: Every day | ORAL | 3 refills | Status: DC

## 2019-09-27 NOTE — Progress Notes (Signed)
   Subjective:    Patient ID: Eddie [name], male    DOB: [birth year], 73 y.o.   MRN: [id number]  Patient presents for ER F/U (shingles- rash from mid back around L side to center chest)  Patient here for patient here for ER follow-up he was diagnosed with herpes zoster on October 17 around the T8 level.  He was prescribed Valtrex is here today for recheck. Did have viral URI he was seen on October 9 He states that the pain has improved during the day but he gets it worse at nighttime when he is moving around.  He has been taking aspirin every other day as needed for pain.  He had flu shot   Review Of Systems:  GEN- denies fatigue, fever, weight loss,weakness, recent illness HEENT- denies eye drainage, change in vision, nasal discharge, CVS- denies chest pain, palpitations RESP- denies SOB, cough, wheeze ABD- denies N/V, change in stools, abd pain GU- denies dysuria, hematuria, dribbling, incontinence MSK- denies joint pain, muscle aches, injury Neuro- denies headache, dizziness, syncope, seizure activity       Objective:    BP 138/66   Pulse 100   Temp 98.1 F (36.7 C) (Oral)   Resp 16   Ht 5\' 7"  (1.702 m)   Wt 128 lb (58.1 kg)   SpO2 98%   BMI 20.05 kg/m  GEN- NAD, alert and oriented x3 HEENT- PERRL, EOMI, non injected sclera, pink conjunctiva, MMM, oropharynx clear CVS- RRR, no murmur RESP-CTAB Blistering rash in various healing stages left mid abdomen to the midline of his lumbar spine posteriorly.  Mild tenderness to palpation.  Some clear fluid from vesicles.  No pus noted mild tenderness to palpation       Assessment & Plan:      Problem List Items Addressed This Visit    None    Visit Diagnoses    Herpes zoster without complication    -  Primary   Herpes zoster.  He is completing Valtrex.  We will start gabapentin 300 mg at bedtime for pain.  In general he is doing fairly well despite his significant outbreak.  He will call if there are any signs of infection.   We will follow-up in the office as scheduled in about 6 weeks.  Then reassess if he needs to stay on the gabapentin.  Advised not to put anything topically on the rash he does not need any creams.      Note: This dictation was prepared with Dragon dictation along with smaller phrase technology. Any transcriptional errors that result from this process are unintentional.

## 2019-09-27 NOTE — Patient Instructions (Signed)
Start Gabapentin at bedtime  Keep area clean  F/U as previous

## 2019-10-01 ENCOUNTER — Other Ambulatory Visit: Payer: Self-pay

## 2019-10-01 ENCOUNTER — Encounter (HOSPITAL_COMMUNITY): Payer: Self-pay

## 2019-10-01 ENCOUNTER — Emergency Department (HOSPITAL_COMMUNITY)
Admission: EM | Admit: 2019-10-01 | Discharge: 2019-10-01 | Disposition: A | Discharge status: Home / Self Care | Payer: Medicare Other | Attending: Emergency Medicine | Admitting: Emergency Medicine

## 2019-10-01 DIAGNOSIS — Z79899 Other long term (current) drug therapy: Secondary | ICD-10-CM | POA: Insufficient documentation

## 2019-10-01 DIAGNOSIS — I129 Hypertensive chronic kidney disease with stage 1 through stage 4 chronic kidney disease, or unspecified chronic kidney disease: Secondary | ICD-10-CM | POA: Diagnosis not present

## 2019-10-01 DIAGNOSIS — N183 Chronic kidney disease, stage 3 unspecified: Secondary | ICD-10-CM | POA: Diagnosis not present

## 2019-10-01 DIAGNOSIS — B029 Zoster without complications: Secondary | ICD-10-CM | POA: Insufficient documentation

## 2019-10-01 DIAGNOSIS — F17210 Nicotine dependence, cigarettes, uncomplicated: Secondary | ICD-10-CM | POA: Insufficient documentation

## 2019-10-01 MED ORDER — HYDROCODONE-ACETAMINOPHEN 5-325 MG PO TABS: ORAL_TABLET | ORAL | 0 refills | Status: DC

## 2019-10-01 MED ORDER — HYDROCODONE-ACETAMINOPHEN 5-325 MG PO TABS
1.0000 | ORAL_TABLET | Freq: Once | ORAL | Status: AC
  Administered 2019-10-01: 10:00:00 1 via ORAL
  Filled 2019-10-01: qty 1

## 2019-10-01 NOTE — Discharge Instructions (Addendum)
Your shingles rash appears to be healing well.  continue taking your gabapentin at bedtime as directed.  Call your primary provider to arrange follow-up if needed.

## 2019-10-01 NOTE — ED Notes (Signed)
Pt verbalized understanding and left with someone else driving.

## 2019-10-01 NOTE — ED Triage Notes (Signed)
Pt reports was diagnosed with shingles last week in ED.  Pt says still having a lot of pain.

## 2019-10-01 NOTE — ED Provider Notes (Signed)
[facility] EMERGENCY DEPARTMENT Provider Note   CSN: [id number] Arrival date & time: 10/01/19  [id number]     History   Chief Complaint Chief Complaint  Patient presents with  . Herpes Zoster    HPI Eddie [name] is a 73 y.o. male.     HPI   Eddie [name] is a 73 y.o. male who presents to the Emergency Department requesting recheck of zoster.  He presented here 10 days ago and seen by me for a burning, painful rash to his right abdomen and mid back.  He was treated with a 10 day course of Valtrex which he completed 3 days ago.  He states the rash is drying up, but continues to have when the affected area is touched and with certain movements.  He followed up with his PCP and prescribed gabapentin for the pain, but he states it isn't helping.  He denies swelling, surrounding redness, drainage, fever and chills    Past Medical History:  Diagnosis Date  . Chronic back pain   . Duodenal ulcer 09/2012  . GERD (gastroesophageal reflux disease)   . Helicobacter pylori (H. pylori) infection 09/2012  . Hypertension   . Severe malnutrition ([place]) 10/26/2012  . 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 Squamous cell carcinoma of esophagus ([place]) 09/25/2012    Patient Active Problem List   Diagnosis Date Noted  . History of colonic polyps 12/13/2018  . Hyponatremia 03/11/2016  . Carotid artery disease ([place]) 01/22/2016  . Wheezing 07/21/2015  . History of esophageal cancer 07/15/2014  . Routine general medical examination at a health care facility 07/02/2014  . CKD (chronic kidney disease) stage 3, GFR 30-59 ml/min (HCC) 07/02/2014  . Adenoma of large intestine 01/27/2014  . Seasonal allergies 04/03/2013  . Duodenal ulcer 10/26/2012  . Edentulous 10/02/2012  . Squamous cell carcinoma of esophagus ([place]) 09/25/2012  . Essential hypertension, benign 07/29/2012  . Alcohol use 07/29/2012  . Pulmonary emphysema ([place]) 07/29/2012  . Tobacco use 07/29/2012    Past Surgical History:   Procedure Laterality Date  . COLONOSCOPY N/A 12/26/2013   Procedure: COLONOSCOPY;  Surgeon: Rogene Houston, MD;  [facility];  Service: Endoscopy;  Laterality: N/A;  830  . ESOPHAGOGASTRODUODENOSCOPY  09/21/2012   Procedure: ESOPHAGOGASTRODUODENOSCOPY (EGD);  Surgeon: Rogene Houston, MD;  [facility];  Service: Endoscopy;  Laterality: N/A;  345  . PEG PLACEMENT  09/21/2012   Procedure: PERCUTANEOUS ENDOSCOPIC GASTROSTOMY (PEG) PLACEMENT;  Surgeon: Rogene Houston, MD;  [facility];  Service: Endoscopy;  Laterality: N/A;  . PEG TUBE PLACEMENT  09/21/2012  . PORTACATH PLACEMENT    . REMOVAL OF GASTROSTOMY TUBE     august 2014        Home Medications    Prior to Admission medications   Medication Sig Start Date End Date Taking? Authorizing Provider  amLODipine (NORVASC) 10 MG tablet Take 1 tablet (10 mg total) by mouth daily. 11/12/18   [name], MD  atorvastatin (LIPITOR) 40 MG tablet Take 1 tablet (40 mg total) by mouth daily. 11/12/18   [place], Modena Nunnery, MD  Cyanocobalamin (VITAMIN B-12 PO) Take 1 tablet by mouth daily.    [provider]  gabapentin (NEURONTIN) 300 MG capsule Take 1 capsule (300 mg total) by mouth at bedtime. 09/27/19   [place], Modena Nunnery, MD  hydrochlorothiazide (HYDRODIURIL) 25 MG tablet Take 1 tablet (25 mg total) by mouth daily. 11/12/18   [place],  Modena Nunnery, MD  terazosin (HYTRIN) 2 MG capsule Take 1 capsule (2 mg total) by mouth at bedtime. 11/12/18   [place], Modena Nunnery, MD  valACYclovir (VALTREX) 1000 MG tablet Take 1 tablet (1,000 mg total) by mouth 3 (three) times daily. 09/21/19   Kem Parkinson, PA-C    Family History Family History  Problem Relation Age of Onset  . Stroke Mother   . Hypertension Father   . Cancer Brother        Throat cancer  . Kidney disease Daughter     Social History Social History   Tobacco Use  . Smoking status: Current Every Day Smoker    Packs/day: 1.00    Years: 50.00     Pack years: 50.00    Types: Cigarettes  . Smokeless tobacco: Never Used  . Tobacco comment: 1/2 to 1 pack x50 yrs.  Substance Use Topics  . Alcohol use: Yes    Alcohol/week: 6.0 standard drinks    Types: 6 Cans of beer per week    Comment: twice a week  . Drug use: No     Allergies   Motrin [ibuprofen]   Review of Systems Review of Systems  Constitutional: Negative for activity change, appetite change, chills and fever.  Respiratory: Negative for cough, chest tightness and shortness of breath.   Cardiovascular: Negative for chest pain.  Gastrointestinal: Negative for abdominal pain and nausea.  Genitourinary: Negative for dysuria and flank pain.  Musculoskeletal: Negative for arthralgias.  Skin: Positive for rash.  Neurological: Negative for syncope, weakness and numbness.     Physical Exam Updated Vital Signs BP 131/81 (BP Location: Left Arm)   Pulse (!) 105   Temp 97.7 F (36.5 C) (Oral)   Resp 12   Ht 5\' 7"  (1.702 m)   Wt 58 kg   SpO2 99%   BMI 20.03 kg/m   Physical Exam Vitals signs and nursing note reviewed.  Constitutional:      Appearance: He is not ill-appearing or toxic-appearing.     Comments: Pt is uncomfortable appearing.  HENT:     Head: Atraumatic.     Mouth/Throat:     Mouth: Mucous membranes are moist.  Neck:     Musculoskeletal: Normal range of motion.  Cardiovascular:     Rate and Rhythm: Normal rate and regular rhythm.     Pulses: Normal pulses.  Pulmonary:     Effort: Pulmonary effort is normal.     Breath sounds: Normal breath sounds.  Abdominal:     Palpations: Abdomen is soft.     Tenderness: There is no abdominal tenderness.  Musculoskeletal: Normal range of motion.  Lymphadenopathy:     Cervical: No cervical adenopathy.  Skin:    General: Skin is warm.     Capillary Refill: Capillary refill takes less than 2 seconds.     Findings: Rash present.     Comments: Healing vesicular rash to the left chest and mid back.   vesicles appear crusted.  No new eruptions.  No surrounding erythema.  Rash is present at the level of the T 8-9 dermatome.        ED Treatments / Results  Labs (all labs ordered are listed, but only abnormal results are displayed) Labs Reviewed - No data to display  EKG None  Radiology No results found.  Procedures Procedures (including critical care time)  Medications Ordered in ED Medications  HYDROcodone-acetaminophen (NORCO/VICODIN) 5-325 MG per tablet 1 tablet (has no administration in time range)  Initial Impression / Assessment and Plan / ED Course  I have reviewed the triage vital signs and the nursing notes.  Pertinent labs & imaging results that were available during my care of the patient were reviewed by me and considered in my medical decision making (see chart for details).        Healing zoster.  Pt is well appearing and non-toxic.  Completed course of Valtrex.  Currently taking gabapentin w/o improvement.  I will provide a short course of Vicodin and he agrees to arrange PCP f/u.    Final Clinical Impressions(s) / ED Diagnoses   Final diagnoses:  Acute pain associated with herpes zoster    ED Discharge Orders    None       Kem Parkinson, PA-C 10/02/19 0841    Nat Christen, MD 10/03/19 1022

## 2019-10-08 ENCOUNTER — Other Ambulatory Visit: Payer: Self-pay | Admitting: Family Medicine

## 2019-10-08 ENCOUNTER — Ambulatory Visit: Payer: Medicare Other | Admitting: Family Medicine

## 2019-10-09 ENCOUNTER — Encounter: Payer: Self-pay | Admitting: Family Medicine

## 2019-10-09 ENCOUNTER — Ambulatory Visit (INDEPENDENT_AMBULATORY_CARE_PROVIDER_SITE_OTHER): Payer: Medicare Other | Admitting: Family Medicine

## 2019-10-09 ENCOUNTER — Other Ambulatory Visit: Payer: Self-pay

## 2019-10-09 VITALS — BP 128/70 | HR 82 | Temp 97.9°F | Resp 14 | Ht 67.0 in | Wt 127.0 lb

## 2019-10-09 DIAGNOSIS — B0229 Other postherpetic nervous system involvement: Secondary | ICD-10-CM | POA: Insufficient documentation

## 2019-10-09 MED ORDER — GABAPENTIN 300 MG PO CAPS: 300.0000 mg | ORAL_CAPSULE | Freq: Three times a day (TID) | ORAL | 3 refills | Status: DC

## 2019-10-09 MED ORDER — HYDROCODONE-ACETAMINOPHEN 7.5-325 MG PO TABS: 1.0000 | ORAL_TABLET | Freq: Four times a day (QID) | ORAL | 0 refills | Status: DC | PRN

## 2019-10-09 NOTE — Progress Notes (Signed)
   Subjective:    Patient ID: Eddie [name], male    DOB: 02/06/1946, 73 y.o.   MRN: [id number]  Patient presents for ER F/U (shingles- still having increased pain)  Patient here for ER follow-up.  He was seen recently secondary to shingles with postherpetic neuralgia.  He was started on gabapentin 300 mg but this is not helped.  He went to the emergency room due to severe pain on his left side.  He was given hydrocodone and advised to follow-up here in the office.  Unfortunately due to a shortage with the local pharmacies he was not able to get the hydrocodone for a week.  He states that it helped minimally.  He has not had any drainage from the lesion states they are actually dried up for the most part.  He does have difficulty sleeping because of discomfort it has affected his appetite a little bit.   Reviewed ER note  Review Of Systems:  GEN- denies fatigue, fever, weight loss,weakness, recent illness HEENT- denies eye drainage, change in vision, nasal discharge, CVS- denies chest pain, palpitations RESP- denies SOB, cough, wheeze ABD- denies N/V, change in stools, abd pain GU- denies dysuria, hematuria, dribbling, incontinence MSK- denies joint pain, muscle aches, injury Neuro- denies headache, dizziness, syncope, seizure activity       Objective:    BP 128/70   Pulse 82   Temp 97.9 F (36.6 C) (Temporal)   Resp 14   Ht 5\' 7"  (1.702 m)   Wt 127 lb (57.6 kg)   SpO2 97%   BMI 19.89 kg/m  GEN- NAD, alert and oriented x3 HEENT- PERRL, EOMI, non injected sclera, pink conjunctiva, MMM, oropharynx clear CVS- RRR, no murmur RESP-CTAB Skin- scabbed hyperpigmented lesions  left mid abdomen to the midline of his lumbar spine posteriorly.  Mild tenderness to palpation.  No vesicles, no pustules         Assessment & Plan:      Problem List Items Addressed This Visit      Unprioritized   Post herpetic neuralgia - Primary    Persistent postherpetic neuralgia.  We will increase  his gabapentin to 300 mg twice daily for the next 2 weeks then up to 3 times daily if needed.  I have also sent over hydrocodone 7.5/325 mg to be used as needed for breakthrough pain.  There is no sign of superinfection on exam today.  He is still maintaining his weight         Note: This dictation was prepared with Dragon dictation along with smaller phrase technology. Any transcriptional errors that result from this process are unintentional.

## 2019-10-09 NOTE — Assessment & Plan Note (Signed)
Persistent postherpetic neuralgia.  We will increase his gabapentin to 300 mg twice daily for the next 2 weeks then up to 3 times daily if needed.  I have also sent over hydrocodone 7.5/325 mg to be used as needed for breakthrough pain.  There is no sign of superinfection on exam today.  He is still maintaining his weight

## 2019-10-09 NOTE — Patient Instructions (Addendum)
Start taking gabapentin twice a day, do this for 2 weeks, if pain not improved, go to three times a day  F/U as previous

## 2019-10-21 ENCOUNTER — Telehealth: Payer: Self-pay | Admitting: *Deleted

## 2019-10-21 MED ORDER — GABAPENTIN 300 MG PO CAPS: 300.0000 mg | ORAL_CAPSULE | Freq: Three times a day (TID) | ORAL | 3 refills | Status: DC

## 2019-10-21 NOTE — Telephone Encounter (Signed)
Call placed to patient and patient made aware.  

## 2019-10-21 NOTE — Telephone Encounter (Signed)
Call pt increase his gabapentin to 300mg  three times a day, this is for the nerve pain, send over the refill for him The hydrocodone helps only a little, we have to control the nerve pain

## 2019-10-21 NOTE — Telephone Encounter (Signed)
Received call from patient.   Reports that he continues to have pain associated with Shingles. Reports no new areas or blisters. States that Hydrocodone/APAP is not effective in managing his pian.   MD please advise.

## 2019-10-22 ENCOUNTER — Emergency Department (HOSPITAL_COMMUNITY)
Admission: EM | Admit: 2019-10-22 | Discharge: 2019-10-22 | Disposition: A | Discharge status: Home / Self Care | Payer: Medicare Other | Attending: Emergency Medicine | Admitting: Emergency Medicine

## 2019-10-22 ENCOUNTER — Encounter (HOSPITAL_COMMUNITY): Payer: Self-pay | Admitting: *Deleted

## 2019-10-22 ENCOUNTER — Other Ambulatory Visit: Payer: Self-pay

## 2019-10-22 DIAGNOSIS — Z79899 Other long term (current) drug therapy: Secondary | ICD-10-CM | POA: Diagnosis not present

## 2019-10-22 DIAGNOSIS — F17210 Nicotine dependence, cigarettes, uncomplicated: Secondary | ICD-10-CM | POA: Insufficient documentation

## 2019-10-22 DIAGNOSIS — I2510 Atherosclerotic heart disease of native coronary artery without angina pectoris: Secondary | ICD-10-CM | POA: Diagnosis not present

## 2019-10-22 DIAGNOSIS — N183 Chronic kidney disease, stage 3 unspecified: Secondary | ICD-10-CM | POA: Insufficient documentation

## 2019-10-22 DIAGNOSIS — I129 Hypertensive chronic kidney disease with stage 1 through stage 4 chronic kidney disease, or unspecified chronic kidney disease: Secondary | ICD-10-CM | POA: Insufficient documentation

## 2019-10-22 DIAGNOSIS — B029 Zoster without complications: Secondary | ICD-10-CM | POA: Insufficient documentation

## 2019-10-22 DIAGNOSIS — Z85828 Personal history of other malignant neoplasm of skin: Secondary | ICD-10-CM | POA: Insufficient documentation

## 2019-10-22 MED ORDER — GABAPENTIN 300 MG PO CAPS: 300.0000 mg | ORAL_CAPSULE | Freq: Three times a day (TID) | ORAL | 3 refills | Status: DC

## 2019-10-22 MED ORDER — OXYCODONE-ACETAMINOPHEN 5-325 MG PO TABS
1.0000 | ORAL_TABLET | Freq: Once | ORAL | Status: AC
  Administered 2019-10-22: 1 via ORAL
  Filled 2019-10-22: qty 1

## 2019-10-22 MED ORDER — LIDOCAINE 5 % EX OINT: 1.0000 "application " | TOPICAL_OINTMENT | CUTANEOUS | 0 refills | Status: DC | PRN

## 2019-10-22 MED ORDER — OXYCODONE-ACETAMINOPHEN 5-325 MG PO TABS: 2.0000 | ORAL_TABLET | Freq: Four times a day (QID) | ORAL | 0 refills | Status: AC | PRN

## 2019-10-22 NOTE — Discharge Instructions (Signed)
You are seen today for pain from shingles.  I am going to have you increase your gabapentin.  Take 300 in the morning, 300 in the afternoon and you may take 600 at bedtime when your pain is the worst.  I will give you a prescription for a numbing cream as well.  You can apply this multiple times a day.  I will also give you a prescription for oxycodone.  This medication might make you sleepy or drowsy so be careful with it and only take it as prescribed.

## 2019-10-22 NOTE — ED Triage Notes (Signed)
States he cannot get any relief from shingles pain in abdomen and chest area, states it is affecting his appetite and is unable to sleep due to pain

## 2019-10-22 NOTE — ED Provider Notes (Addendum)
[facility] EMERGENCY DEPARTMENT Provider Note   CSN: [id number] Arrival date & time: 10/22/19  1413     History   Chief Complaint Chief Complaint  Patient presents with   Herpes Zoster    HPI Eddie [name] is a 73 y.o. male.     Patient is a 73 year old male with past medical history of chronic back pain, hypertension, postherpetic neuralgia, carotid artery disease, CKD presenting to the emergency department for postherpetic neuralgia.  Patient was first diagnosed with shingles on 17 October.  He has been seeing his primary care doctor for further management of this.  He is currently on 300 mg of gabapentin 3 times daily and was also given some hydrocodone.  Reports he has continuous pain in the area of the rash.  Pain is worse when he lays down to sleep at nighttime.  It is better when he cleanses it with warm water.  He denies any fever, chills, nausea, vomiting.  Denies any new symptoms.     Past Medical History:  Diagnosis Date   Chronic back pain    Duodenal ulcer 09/2012   GERD (gastroesophageal reflux disease)    Helicobacter pylori (H. pylori) infection 09/2012   Hypertension    Severe malnutrition ([place]) 10/26/2012   Squamous cell carcinoma Oct 2014   Esophogus   Squamous cell carcinoma of esophagus ([place]) 09/25/2012    Patient Active Problem List   Diagnosis Date Noted   Post herpetic neuralgia 10/09/2019   History of colonic polyps 12/13/2018   Hyponatremia 03/11/2016   Carotid artery disease ([place]) 01/22/2016   Wheezing 07/21/2015   History of esophageal cancer 07/15/2014   Routine general medical examination at a health care facility 07/02/2014   CKD (chronic kidney disease) stage 3, GFR 30-59 ml/min (HCC) 07/02/2014   Adenoma of large intestine 01/27/2014   Seasonal allergies 04/03/2013   Duodenal ulcer 10/26/2012   Edentulous 10/02/2012   Squamous cell carcinoma of esophagus ([employer]) 09/25/2012   Essential hypertension, benign  07/29/2012   Alcohol use 07/29/2012   Pulmonary emphysema ([place]) 07/29/2012   Tobacco use 07/29/2012    Past Surgical History:  Procedure Laterality Date   COLONOSCOPY N/A 12/26/2013   Procedure: COLONOSCOPY;  Surgeon: Rogene Houston, MD;  Location: [facility];  Service: Endoscopy;  Laterality: N/A;  830   ESOPHAGOGASTRODUODENOSCOPY  09/21/2012   Procedure: ESOPHAGOGASTRODUODENOSCOPY (EGD);  Surgeon: Rogene Houston, MD;  Location: [facility] ENDO SU[facility];  Service: Endoscopy;  Laterality: N/A;  345   PEG PLACEMENT  09/21/2012   Procedure: PERCUTANEOUS ENDOSCOPIC GASTROSTOMY (PEG) PLACEMENT;  Surgeon: Rogene Houston, MD;  Location: [facility] END[facility];  Service: Endoscopy;  Laterality: N/A;   PEG TUBE PLACEMENT  09/21/2012   PORTACATH PLACEMENT     REMOVAL OF GASTROSTOMY TUBE     august 2014        Home Medications    Prior to Admission medications   Medication Sig Start Date End Date Taking? Authorizing Provider  amLODipine (NORVASC) 10 MG tablet TAKE 1 TABLET BY MOUTH ONCE A DAY. 10/08/19   [name], MD  atorvastatin (LIPITOR) 40 MG tablet Take 1 tablet (40 mg total) by mouth daily. 11/12/18   [place], Modena Nunnery, MD  Cyanocobalamin (VITAMIN B-12 PO) Take 1 tablet by mouth daily.    [provider]  gabapentin (NEURONTIN) 300 MG capsule Take 1 capsule (300 mg total) by mouth 3 (three) times daily. Take 1 capsule in the morning, 1 capsule in the afternoon and  2 capsules at bedtime 10/22/19   Madilyn Hook A, PA-C  hydrochlorothiazide (HYDRODIURIL) 25 MG tablet Take 1 tablet (25 mg total) by mouth daily. 11/12/18   [place], Modena Nunnery, MD  HYDROcodone-acetaminophen (NORCO) 7.5-325 MG tablet Take 1 tablet by mouth every 6 (six) hours as needed for moderate pain. 10/09/19   [place], Modena Nunnery, MD  lidocaine (XYLOCAINE) 5 % ointment Apply 1 application topically as needed. 10/22/19   Alveria Apley, PA-C  oxyCODONE-acetaminophen (PERCOCET/ROXICET) 5-325 MG tablet Take 2 tablets  by mouth every 6 (six) hours as needed for up to 3 days for severe pain. 10/22/19 10/25/19  Madilyn Hook A, PA-C  terazosin (HYTRIN) 2 MG capsule Take 1 capsule (2 mg total) by mouth at bedtime. 11/12/18   [place], Modena Nunnery, MD  valACYclovir (VALTREX) 1000 MG tablet Take 1 tablet (1,000 mg total) by mouth 3 (three) times daily. 09/21/19   Kem Parkinson, PA-C    Family History Family History  Problem Relation Age of Onset   Stroke Mother    Hypertension Father    Cancer Brother        Throat cancer   Kidney disease Daughter     Social History Social History   Tobacco Use   Smoking status: Current Every Day Smoker    Packs/day: 1.00    Years: 50.00    Pack years: 50.00    Types: Cigarettes   Smokeless tobacco: Never Used   Tobacco comment: 1/2 to 1 pack x50 yrs.  Substance Use Topics   Alcohol use: Yes    Alcohol/week: 6.0 standard drinks    Types: 6 Cans of beer per week    Comment: twice a week   Drug use: No     Allergies   Motrin [ibuprofen]   Review of Systems Review of Systems  Constitutional: Negative for chills and fever.  Respiratory: Negative for cough and shortness of breath.   Cardiovascular: Negative for chest pain.  Gastrointestinal: Negative for nausea and vomiting.  Musculoskeletal: Negative for back pain and myalgias.  Skin: Positive for rash.  Neurological: Negative for dizziness and light-headedness.     Physical Exam Updated Vital Signs BP (!) 155/90    Pulse 72    Temp 97.6 F (36.4 C) (Oral)    Resp 16    Ht 5\' 7"  (1.702 m)    Wt 57.6 kg    SpO2 100%    BMI 19.89 kg/m   Physical Exam Vitals signs and nursing note reviewed.  Constitutional:      General: He is not in acute distress.    Appearance: Normal appearance. He is not ill-appearing, toxic-appearing or diaphoretic.  HENT:     Head: Normocephalic.  Eyes:     Conjunctiva/sclera: Conjunctivae normal.  Pulmonary:     Effort: Pulmonary effort is normal.  Chest:      Comments: Patient has a healing/scabbing rash in the distribution of T9 on the left all the way from the abdomen to the spine.  There are no open sores or blisters anymore there is no surrounding erythema, no drainage, no swelling.  He is sensitive in the skin to the area of the rash with just light touch. Skin:    General: Skin is warm and dry.     Coloration: Skin is not jaundiced.     Findings: Rash present. No bruising.  Neurological:     Mental Status: He is alert.  Psychiatric:        Mood and Affect: Mood  normal.      ED Treatments / Results  Labs (all labs ordered are listed, but only abnormal results are displayed) Labs Reviewed - No data to display  EKG None  Radiology No results found.  Procedures Procedures (including critical care time)  Medications Ordered in ED Medications  oxyCODONE-acetaminophen (PERCOCET/ROXICET) 5-325 MG per tablet 1 tablet (1 tablet Oral Given 10/22/19 1735)     Initial Impression / Assessment and Plan / ED Course  I have reviewed the triage vital signs and the nursing notes.  Pertinent labs & imaging results that were available during my care of the patient were reviewed by me and considered in my medical decision making (see chart for details).  Clinical Course as of Oct 21 1937  Tue Oct 22, 2019  1937 Patient was seen for pain relief of his herpes zoster.  Patient's rash on my exam appears to be healed but he does have sensitivity to the skin to still.  He is being treated for his post herpetic neuralgia by his primary care doctor.  He reports that he ran out of hydrocodone and is taking gabapentin.  I advised him he should increase his gabapentin and I will give him a short course of oxycodone.  I will also prescribe him some lidocaine gel.  He was advised to follow-up with his primary care doctor and that he may need to see pain management for intercostal nerve blocks which can also be done for pain treatment.   [KM]    Clinical  Course User Index [KM] Alveria Apley, PA-C       Based on review of vitals, medical screening exam, lab work and/or imaging, there does not appear to be an acute, emergent etiology for the patient's symptoms. Counseled pt on good return precautions and encouraged both PCP and ED follow-up as needed.  Prior to discharge, I also discussed incidental imaging findings with patient in detail and advised appropriate, recommended follow-up in detail.  Clinical Impression: 1. Herpes zoster without complication     Disposition: Discharge  Prior to providing a prescription for a controlled substance, I independently reviewed the patient's recent prescription history on the [place]. The patient had no recent or regular prescriptions and was deemed appropriate for a brief, less than 3 day prescription of narcotic for acute analgesia.  This note was prepared with assistance of Systems analyst. Occasional wrong-word or sound-a-like substitutions may have occurred due to the inherent limitations of voice recognition software.   Final Clinical Impressions(s) / ED Diagnoses   Final diagnoses:  Herpes zoster without complication    ED Discharge Orders         Ordered    lidocaine (XYLOCAINE) 5 % ointment  As needed     10/22/19 1721    oxyCODONE-acetaminophen (PERCOCET/ROXICET) 5-325 MG tablet  Every 6 hours PRN     10/22/19 1721    gabapentin (NEURONTIN) 300 MG capsule  3 times daily     10/22/19 1721           Kristine Royal 10/22/19 1805    Alveria Apley, PA-C 10/22/19 1937    Alveria Apley, PA-C 10/22/19 Melina Fiddler, MD 10/26/19 [phone]

## 2019-10-29 ENCOUNTER — Telehealth: Payer: Self-pay | Admitting: *Deleted

## 2019-10-29 MED ORDER — GABAPENTIN 300 MG PO CAPS: ORAL_CAPSULE | ORAL | 3 refills | Status: DC

## 2019-10-29 MED ORDER — OXYCODONE-ACETAMINOPHEN 5-325 MG PO TABS: 1.0000 | ORAL_TABLET | Freq: Four times a day (QID) | ORAL | 0 refills | Status: DC | PRN

## 2019-10-29 NOTE — Telephone Encounter (Signed)
Medication refilled  Oxycodone APAP #30 tabs given

## 2019-10-29 NOTE — Telephone Encounter (Signed)
Call placed to patient and patient made aware.  

## 2019-10-29 NOTE — Telephone Encounter (Signed)
Received call from patient.   Reports that he is completely out of medication for shingles. States that he has run out of Gabapentin and pharmacy will not fill yet. Call placed to pharmacy. Reports that there were questions in regards to new prescription from ER as 1) it was a different provider and 2) the quantity was not sufficient for a month's supply.   Patient also requested refill on pain medication. States that he was given Oxycodone/APAP in ER and was advised to take (2) as needed. States that this was more effective than the Hydrocodone/PAP PCP gave.   MD please advise.

## 2019-11-11 ENCOUNTER — Other Ambulatory Visit: Payer: Self-pay

## 2019-11-12 ENCOUNTER — Ambulatory Visit (INDEPENDENT_AMBULATORY_CARE_PROVIDER_SITE_OTHER): Payer: Medicare Other | Admitting: Family Medicine

## 2019-11-12 ENCOUNTER — Encounter: Payer: Self-pay | Admitting: Family Medicine

## 2019-11-12 VITALS — BP 126/64 | HR 78 | Temp 98.3°F | Resp 14 | Ht 67.0 in | Wt 126.0 lb

## 2019-11-12 DIAGNOSIS — I10 Essential (primary) hypertension: Secondary | ICD-10-CM | POA: Diagnosis not present

## 2019-11-12 DIAGNOSIS — B0229 Other postherpetic nervous system involvement: Secondary | ICD-10-CM

## 2019-11-12 MED ORDER — OXYCODONE-ACETAMINOPHEN 5-325 MG PO TABS: 1.0000 | ORAL_TABLET | Freq: Four times a day (QID) | ORAL | 0 refills | Status: DC | PRN

## 2019-11-12 MED ORDER — PREGABALIN 75 MG PO CAPS: 75.0000 mg | ORAL_CAPSULE | Freq: Two times a day (BID) | ORAL | 2 refills | Status: DC

## 2019-11-12 NOTE — Assessment & Plan Note (Addendum)
Sniffing and pain with regards to his shingles outbreak.  There is no sign of any superinfection the skin is actually healing nicely.  The gabapentin is not working so I [place] transition him over to Lyrica 75 mg twice a day and titrate up.  I have also refilled the oxycodone for now.  Tonight he will decrease his gabapentin to just 300 mg to the when he starts the Lyrica in the morning he would not have high doses in his system. We will also go back to the Lidoderm gel since the lesions have scabbed over.  We will follow-up via telehealth in 2 weeks and adjust his medications accordingly.

## 2019-11-12 NOTE — Progress Notes (Signed)
   Subjective:    Patient ID: Eddie [name], male    DOB: 1946/09/04, 73 y.o.   MRN: [id number]  Patient presents for Follow-up Patient here for recheck on medications and his postherpetic neuralgia.  He has had significant difficulty with his nerve pain status post shingles outbreak.  He is currently on gabapentin 300 mg twice daily and 600 mg at bedtime and oxycodone 5/325 mg although he recently ran out of this medication.  He does not feel like the nerve medicine is helping. Decreased appetite secondary to the pain.  He has tingling and burning sensation with his clothing and when he showers or anything touches the skin.  Sometimes he feels like the skin is swollen.  He has not had any draining lesions since they healed over  Hypertension he is taking his amlodipine and HCTZ as prescribed without any side effects  Lipidemia he is on atorvastatin 40 mg without difficulty, normal lipid panel in June    Review Of Systems:  GEN- denies fatigue, fever, weight loss,weakness, recent illness HEENT- denies eye drainage, change in vision, nasal discharge, CVS- denies chest pain, palpitations RESP- denies SOB, cough, wheeze ABD- denies N/V, change in stools, abd pain GU- denies dysuria, hematuria, dribbling, incontinence MSK- denies joint pain, muscle aches, injury Neuro- denies headache, dizziness, syncope, seizure activity       Objective:    BP 126/64   Pulse 78   Temp 98.3 F (36.8 C) (Temporal)   Resp 14   Ht 5\' 7"  (1.702 m)   Wt 126 lb (57.2 kg)   SpO2 98%   BMI 19.73 kg/m  GEN- NAD, alert and oriented x3 CVS- RRR, no murmur RESP-CTAB ABD-NABS,soft,NT,ND Skin- s hypopigmented scarring left mid abdomen to the midline of his lumbar spine posteriorly.  Mild tenderness to palpation.  No vesicles, no pustules  EXT- No edema Pulses- Radial, DP- 2+        Assessment & Plan:      Problem List Items Addressed This Visit      Unprioritized   Essential hypertension, benign -  Primary    Blood pressures well controlled no change in medication.  We will check his renal function today.      Relevant Orders   CBC with Differential   Basic metabolic panel   Post herpetic neuralgia    Sniffing and pain with regards to his shingles outbreak.  There is no sign of any superinfection the skin is actually healing nicely.  The gabapentin is not working so I [place] transition him over to Lyrica 75 mg twice a day and titrate up.  I have also refilled the oxycodone for now.  Tonight he will decrease his gabapentin to just 300 mg to the when he starts the Lyrica in the morning he would not have high doses in his system.         Note: This dictation was prepared with Dragon dictation along with smaller phrase technology. Any transcriptional errors that result from this process are unintentional.

## 2019-11-12 NOTE — Patient Instructions (Addendum)
We will call with lab results Start Lyrica 75mg  twice a day  Decrease the gabapentin, tonight only take 300mg  at bedtime Then tomorrow start the lyrica with 75mg  in the morning and 75mg  at bedtime, do not take any gabapentin Use the oxycodone as needed Go back to the lidoderm gel , you can use up to 4 times a day  F/U 2 weeks Telehealth

## 2019-11-12 NOTE — Assessment & Plan Note (Signed)
Blood pressures well controlled no change in medication.  We will check his renal function today.

## 2019-11-13 LAB — BASIC METABOLIC PANEL
BUN/Creatinine Ratio: 10 (calc) (ref 6–22)
BUN: 13 mg/dL (ref 7–25)
CO2: 23 mmol/L (ref 20–32)
Calcium: 9.6 mg/dL (ref 8.6–10.3)
Chloride: 97 mmol/L — ABNORMAL LOW (ref 98–110)
Creat: 1.3 mg/dL — ABNORMAL HIGH (ref 0.70–1.18)
Glucose, Bld: 101 mg/dL — ABNORMAL HIGH (ref 65–99)
Potassium: 3.9 mmol/L (ref 3.5–5.3)
Sodium: 132 mmol/L — ABNORMAL LOW (ref 135–146)

## 2019-11-13 LAB — CBC WITH DIFFERENTIAL/PLATELET
Absolute Monocytes: 906 cells/uL (ref 200–950)
Basophils Absolute: 82 cells/uL (ref 0–200)
Basophils Relative: 0.8 %
Eosinophils Absolute: 371 cells/uL (ref 15–500)
Eosinophils Relative: 3.6 %
HCT: 40.4 % (ref 38.5–50.0)
Hemoglobin: 13.6 g/dL (ref 13.2–17.1)
Lymphs Abs: 1885 cells/uL (ref 850–3900)
MCH: 32.9 pg (ref 27.0–33.0)
MCHC: 33.7 g/dL (ref 32.0–36.0)
MCV: 97.6 fL (ref 80.0–100.0)
MPV: 9.5 fL (ref 7.5–12.5)
Monocytes Relative: 8.8 %
Neutro Abs: 7056 cells/uL (ref 1500–7800)
Neutrophils Relative %: 68.5 %
Platelets: 249 10*3/uL (ref 140–400)
RBC: 4.14 10*6/uL — ABNORMAL LOW (ref 4.20–5.80)
RDW: 11.1 % (ref 11.0–15.0)
Total Lymphocyte: 18.3 %
WBC: 10.3 10*3/uL (ref 3.8–10.8)

## 2019-11-18 ENCOUNTER — Other Ambulatory Visit: Payer: Self-pay | Admitting: Family Medicine

## 2019-12-02 ENCOUNTER — Other Ambulatory Visit: Payer: Self-pay | Admitting: Family Medicine

## 2019-12-02 NOTE — Telephone Encounter (Signed)
Ok to refill Oxycodone/APAP??  Last office visit/ refill 11/12/2019.

## 2019-12-16 ENCOUNTER — Ambulatory Visit (INDEPENDENT_AMBULATORY_CARE_PROVIDER_SITE_OTHER): Payer: Medicare Other | Admitting: Nurse Practitioner

## 2019-12-17 ENCOUNTER — Other Ambulatory Visit: Payer: Self-pay

## 2019-12-17 ENCOUNTER — Encounter (INDEPENDENT_AMBULATORY_CARE_PROVIDER_SITE_OTHER): Payer: Self-pay | Admitting: Internal Medicine

## 2019-12-17 ENCOUNTER — Ambulatory Visit (INDEPENDENT_AMBULATORY_CARE_PROVIDER_SITE_OTHER): Payer: Medicare Other | Admitting: Internal Medicine

## 2019-12-17 VITALS — Wt 127.0 lb

## 2019-12-17 DIAGNOSIS — Z8501 Personal history of malignant neoplasm of esophagus: Secondary | ICD-10-CM | POA: Diagnosis not present

## 2019-12-17 DIAGNOSIS — Z86010 Personal history of colonic polyps: Secondary | ICD-10-CM | POA: Diagnosis not present

## 2019-12-17 NOTE — Progress Notes (Signed)
Virtual Visit via Telephone Note  Patient had face-to-face scheduled visit today.  Visit was changed to virtual/telephone visit because of ongoing Covid-19 pandemic and we both agree. I connected with [name] on 12/17/19 at  4:41 PM EST by telephone and verified that I am speaking with the correct person using two identifiers.  Location: Patient: home Provider: office   I discussed the limitations, risks, security and privacy concerns of performing an evaluation and management service by telephone and the availability of in person appointments. I also discussed with the patient that there may be a patient responsible charge related to this service. The patient expressed understanding and agreed to proceed.   History of Present Illness:  Patient is 74 year old Afro-American male who was diagnosed with squamous cell carcinoma of the esophagus in October 2013 when he presented with dysphagia and weight loss.  PEG tube was placed at a later date and he underwent chemoradiation therapy and responded well.  PEG tube was removed in April 2014.  PET scan was negative in January 2014 as well as in July 2015.  He was followed by oncology and his last chest CT was in January 2017.  He has remained in remission and was released from oncology.  Patient was last seen in the office and he was doing well.  Patient states that he develop shingles in October 2017 involving one of the left lower thoracic or lumbar dermatomes.  He said he is still having pain which comes and goes.  Rash has resolved.  He is presently on gabapentin teen.  He is not taking pregabalin anymore. He states he is doing well from GI standpoint.  His appetite is good.  He is maintaining his weight.  He denies heartburn dysphagia nausea or vomiting.  He also denies abdominal pain melena or rectal bleeding.  Medication list reviewed and updated.   Observations/Objective:  Patient reported his weight to be 127 pounds He weighed 128  pounds on 12/13/2018.  Assessment and Plan:  #1.  History of esophageal squamous cell carcinoma.  He was diagnosed in October 2013 and treated with chemoradiation.  He remains in remission.  He has no symptoms of GERD or dysphagia.  #2.  History of colonic adenoma.  He had 8 mm pedunculated tubular adenoma removed in January 2015.  He is due for surveillance colonoscopy which would be delayed until Covid-19 pandemic over.  Follow Up Instructions:  Patient advised to call office experiences dysphagia or heartburn. Surveillance colonoscopy to be scheduled later this year. Office visit in 1 year.  I discussed the assessment and treatment plan with the patient. The patient was provided an opportunity to ask questions and all were answered. The patient agreed with the plan and demonstrated an understanding of the instructions.   The patient was advised to call back or seek an in-person evaluation if the symptoms worsen or if the condition fails to improve as anticipated.  I provided 7 minutes of non-face-to-face time during this encounter.   Hildred Laser, MD

## 2019-12-31 ENCOUNTER — Other Ambulatory Visit: Payer: Self-pay | Admitting: Family Medicine

## 2019-12-31 NOTE — Telephone Encounter (Signed)
Ok to refill Oxycodone/APAP??  Last office visit 11/12/2019.  Last refill 12/02/2019.

## 2020-01-14 ENCOUNTER — Telehealth: Payer: Self-pay | Admitting: *Deleted

## 2020-01-14 MED ORDER — SHINGRIX 50 MCG/0.5ML IM SUSR: 0.5000 mL | Freq: Once | INTRAMUSCULAR | 1 refills | Status: AC

## 2020-01-14 NOTE — Telephone Encounter (Signed)
Received call from patient.   Reports that he had recent shingles infection that began on 09/21/2019.  Inquired as to when he can have shingles vaccine.   MD please advise.

## 2020-01-14 NOTE — Telephone Encounter (Signed)
Call placed to patient and patient made aware.   Prescription sent to pharmacy.  

## 2020-01-14 NOTE — Telephone Encounter (Signed)
He can get the vaccine now if he wants Okay to send to pharmacy

## 2020-02-03 ENCOUNTER — Other Ambulatory Visit: Payer: Self-pay | Admitting: Family Medicine

## 2020-02-03 NOTE — Telephone Encounter (Signed)
Ok to refill??  Last office visit 11/12/2019.  Last refill 12/31/2019.

## 2020-02-21 ENCOUNTER — Other Ambulatory Visit: Payer: Self-pay | Admitting: Family Medicine

## 2020-03-17 ENCOUNTER — Other Ambulatory Visit: Payer: Self-pay | Admitting: Family Medicine

## 2020-03-17 NOTE — Telephone Encounter (Signed)
Call pt and verify in the morning  He was supposed to stop gabapentin and take lyrica 75mg  BID What is he taking?  He should not be on both

## 2020-03-17 NOTE — Telephone Encounter (Signed)
Ok to refill Lyrica?  Medication is no longer on current list.

## 2020-03-18 NOTE — Telephone Encounter (Signed)
   Call pt D/C lyrica  Only take the gabapentin

## 2020-03-18 NOTE — Telephone Encounter (Signed)
Call placed to patient to inquire.   Reports that he has been taking both medications for nerve pain.   States that gabapentin does seem to work better than Lyrica.   MD please advise.

## 2020-04-06 ENCOUNTER — Other Ambulatory Visit: Payer: Self-pay | Admitting: Family Medicine

## 2020-04-21 ENCOUNTER — Encounter (INDEPENDENT_AMBULATORY_CARE_PROVIDER_SITE_OTHER): Payer: Self-pay | Admitting: *Deleted

## 2020-05-20 ENCOUNTER — Other Ambulatory Visit: Payer: Self-pay | Admitting: Family Medicine

## 2020-05-28 ENCOUNTER — Other Ambulatory Visit: Payer: Self-pay | Admitting: Family Medicine

## 2020-05-29 ENCOUNTER — Telehealth: Payer: Self-pay | Admitting: Family Medicine

## 2020-05-29 NOTE — Progress Notes (Signed)
  Chronic Care Management   Note  05/29/2020 Name: AREN PRYDE MRN: [id number] DOB: Mar 02, 1946  ARDELL MAKAREWICZ is a 74 y.o. year old male who is a primary care patient of [place], Modena Nunnery, MD. I reached out to [name] by phone today in response to a referral sent by Mr. Melvern Banker PCP, [name], MD.   Mr. Ruby was given information about Chronic Care Management services today including:  1. CCM service includes personalized support from designated clinical staff supervised by his physician, including individualized plan of care and coordination with other care providers 2. 24/7 contact phone numbers for assistance for urgent and routine care needs. 3. Service will only be billed when office clinical staff spend 20 minutes or more in a month to coordinate care. 4. Only one practitioner may furnish and bill the service in a calendar month. 5. The patient may stop CCM services at any time (effective at the end of the month) by phone call to the office staff.   Patient agreed to services and verbal consent obtained.   Follow up plan:   [place]

## 2020-05-29 NOTE — Progress Notes (Signed)
  Chronic Care Management   Outreach Note  05/29/2020 Name: Eddie [name] MRN: [id number] DOB: 1946-06-13  Referred by: [name], MD Reason for referral : Chronic Care Management (Initial CCM Outreach)   An unsuccessful telephone outreach was attempted today. The patient was referred to the pharmacist for assistance with care management and care coordination.   Follow Up Plan:   [place]

## 2020-06-09 ENCOUNTER — Encounter: Payer: Self-pay | Admitting: Family Medicine

## 2020-06-09 ENCOUNTER — Ambulatory Visit (INDEPENDENT_AMBULATORY_CARE_PROVIDER_SITE_OTHER): Payer: Medicare Other | Admitting: Family Medicine

## 2020-06-09 ENCOUNTER — Other Ambulatory Visit: Payer: Self-pay

## 2020-06-09 VITALS — BP 132/78 | HR 70 | Temp 98.6°F | Resp 14 | Ht 67.0 in | Wt 138.0 lb

## 2020-06-09 DIAGNOSIS — I6523 Occlusion and stenosis of bilateral carotid arteries: Secondary | ICD-10-CM

## 2020-06-09 DIAGNOSIS — I10 Essential (primary) hypertension: Secondary | ICD-10-CM | POA: Diagnosis not present

## 2020-06-09 DIAGNOSIS — Z720 Tobacco use: Secondary | ICD-10-CM | POA: Diagnosis not present

## 2020-06-09 DIAGNOSIS — N1832 Chronic kidney disease, stage 3b: Secondary | ICD-10-CM

## 2020-06-09 DIAGNOSIS — B0229 Other postherpetic nervous system involvement: Secondary | ICD-10-CM

## 2020-06-09 DIAGNOSIS — J432 Centrilobular emphysema: Secondary | ICD-10-CM

## 2020-06-09 DIAGNOSIS — Z125 Encounter for screening for malignant neoplasm of prostate: Secondary | ICD-10-CM

## 2020-06-09 NOTE — Assessment & Plan Note (Signed)
Recheck carotid duplex.  Continue Lipitor 40 mg.  Discussed tobacco cessation

## 2020-06-09 NOTE — Assessment & Plan Note (Signed)
Asymptomatic.  No current inhalers

## 2020-06-09 NOTE — Progress Notes (Signed)
   Subjective:    Patient ID: Eddie [name], male    DOB: 1946-02-25, 74 y.o.   MRN: [id number]  Patient presents for Follow-up (had breakfast, no lunch)   Pt here to f/u chronic medical problems   He has gained 10lbs   He is drinking ensure few times a week  appetite, appeite is good  bowel normal normally    HTN - Taking HCTZ, norvasc and hytrin    Post herpetic - he is taking gabapentin typically 2 at bedtime   , his pain is controlled with this regimen.   Hyperlipidemia-he is taking statin drug as described he also has carotid artery stenosis which is due for recheck.  Pulmonary emphysema he is not having symptoms from this.  He does continue to smoke is not ready to quit.  BPH- taking hytrin at bedtime      Review Of Systems:  GEN- denies fatigue, fever, weight loss,weakness, recent illness HEENT- denies eye drainage, change in vision, nasal discharge, CVS- denies chest pain, palpitations RESP- denies SOB, cough, wheeze ABD- denies N/V, change in stools, abd pain GU- denies dysuria, hematuria, dribbling, incontinence MSK- denies joint pain, muscle aches, injury Neuro- denies headache, dizziness, syncope, seizure activity       Objective:    BP 132/78   Pulse 70   Temp 98.6 F (37 C) (Temporal)   Resp 14   Ht 5\' 7"  (1.702 m)   Wt 138 lb (62.6 kg)   SpO2 96%   BMI 21.61 kg/m  GEN- NAD, alert and oriented x3 HEENT- PERRL, EOMI, non injected sclera, pink conjunctiva, MMM, oropharynx clear Neck- Supple, no thyromegaly CVS- RRR, no murmur RESP-CTAB ABD-NABS,soft,NT,ND EXT- No edema Pulses- Radial, DP- 2+        Assessment & Plan:      Problem List Items Addressed This Visit      Unprioritized   Carotid artery disease ([place])    Recheck carotid duplex.  Continue Lipitor 40 mg.  Discussed tobacco cessation      Relevant Orders   Lipid panel   US Carotid Duplex Bilateral   CKD (chronic kidney disease) stage 3, GFR 30-59 ml/min (HCC)   Essential  hypertension, benign - Primary    Pressure control no changes.      Relevant Orders   CBC with Differential/Platelet   Comprehensive metabolic panel   Lipid panel   Post herpetic neuralgia    Controlled with gabapentin now down to 600mg  at bedtime.      Pulmonary emphysema (HCC)    Asymptomatic.  No current inhalers      Tobacco use    Counseled on cessation.       Other Visit Diagnoses    Prostate cancer screening       Relevant Orders   PSA      Note: This dictation was prepared with Dragon dictation along with smaller phrase technology. Any transcriptional errors that result from this process are unintentional.

## 2020-06-09 NOTE — Assessment & Plan Note (Signed)
Controlled with gabapentin now down to 600mg  at bedtime.

## 2020-06-09 NOTE — Assessment & Plan Note (Signed)
Counseled on cessation 

## 2020-06-09 NOTE — Assessment & Plan Note (Signed)
Pressure control no changes.

## 2020-06-09 NOTE — Patient Instructions (Addendum)
We will call with lab results Carotid artery ultrasound to be done Work on the smoking Blood pressure looks good F/U Nov or December for Physical

## 2020-06-10 LAB — CBC WITH DIFFERENTIAL/PLATELET
Absolute Monocytes: 832 cells/uL (ref 200–950)
Basophils Absolute: 94 cells/uL (ref 0–200)
Basophils Relative: 0.9 %
Eosinophils Absolute: 322 cells/uL (ref 15–500)
Eosinophils Relative: 3.1 %
HCT: 45.5 % (ref 38.5–50.0)
Hemoglobin: 15.2 g/dL (ref 13.2–17.1)
Lymphs Abs: 2142 cells/uL (ref 850–3900)
MCH: 32.3 pg (ref 27.0–33.0)
MCHC: 33.4 g/dL (ref 32.0–36.0)
MCV: 96.8 fL (ref 80.0–100.0)
MPV: 10.3 fL (ref 7.5–12.5)
Monocytes Relative: 8 %
Neutro Abs: 7010 cells/uL (ref 1500–7800)
Neutrophils Relative %: 67.4 %
Platelets: 159 10*3/uL (ref 140–400)
RBC: 4.7 10*6/uL (ref 4.20–5.80)
RDW: 11.2 % (ref 11.0–15.0)
Total Lymphocyte: 20.6 %
WBC: 10.4 10*3/uL (ref 3.8–10.8)

## 2020-06-10 LAB — LIPID PANEL
Cholesterol: 133 mg/dL (ref <200)
HDL: 67 mg/dL (ref >=40)
LDL Cholesterol (Calc): 47 mg/dL (calc)
Non-HDL Cholesterol (Calc): 66 mg/dL (calc) (ref <130)
Total CHOL/HDL Ratio: 2 (calc) (ref <5.0)
Triglycerides: 109 mg/dL (ref <150)

## 2020-06-10 LAB — COMPREHENSIVE METABOLIC PANEL
AG Ratio: 1.8 (calc) (ref 1.0–2.5)
ALT: 12 U/L (ref 9–46)
AST: 20 U/L (ref 10–35)
Albumin: 4.5 g/dL (ref 3.6–5.1)
Alkaline phosphatase (APISO): 73 U/L (ref 35–144)
BUN/Creatinine Ratio: 16 (calc) (ref 6–22)
BUN: 23 mg/dL (ref 7–25)
CO2: 21 mmol/L (ref 20–32)
Calcium: 9.6 mg/dL (ref 8.6–10.3)
Chloride: 98 mmol/L (ref 98–110)
Creat: 1.4 mg/dL — ABNORMAL HIGH (ref 0.70–1.18)
Globulin: 2.5 g/dL (calc) (ref 1.9–3.7)
Glucose, Bld: 91 mg/dL (ref 65–99)
Potassium: 4.5 mmol/L (ref 3.5–5.3)
Sodium: 131 mmol/L — ABNORMAL LOW (ref 135–146)
Total Bilirubin: 0.9 mg/dL (ref 0.2–1.2)
Total Protein: 7 g/dL (ref 6.1–8.1)

## 2020-06-10 LAB — PSA: PSA: 1.4 ng/mL (ref <=4.0)

## 2020-06-11 ENCOUNTER — Encounter: Payer: Self-pay | Admitting: *Deleted

## 2020-07-01 ENCOUNTER — Ambulatory Visit (HOSPITAL_COMMUNITY)
Admission: RE | Admit: 2020-07-01 | Discharge: 2020-07-01 | Disposition: A | Discharge status: Home / Self Care | Payer: Medicare Other | Source: Ambulatory Visit | Attending: Family Medicine | Admitting: Family Medicine

## 2020-07-01 ENCOUNTER — Other Ambulatory Visit: Payer: Self-pay | Admitting: Family Medicine

## 2020-07-01 ENCOUNTER — Other Ambulatory Visit: Payer: Self-pay

## 2020-07-01 DIAGNOSIS — I6523 Occlusion and stenosis of bilateral carotid arteries: Secondary | ICD-10-CM | POA: Diagnosis not present

## 2020-07-06 ENCOUNTER — Other Ambulatory Visit: Payer: Self-pay | Admitting: Family Medicine

## 2020-07-27 NOTE — Chronic Care Management (AMB) (Signed)
Chronic Care Management Pharmacy  Name: Eddie [name]  MRN: [id number] DOB: 09-20-1946  Chief Complaint/ HPI  Eddie [name],  74 y.o. , male presents for their Initial CCM visit with the clinical pharmacist In office.  PCP : [name], MD  Their chronic conditions include: HTN, CKD, tobacco use.  Office Visits: 06/09/2020 [facility]) -   Counseled on smoking cessation again  No changes to medication   Stable overall  Consult Visit: none recent  Medications: Outpatient Encounter Medications as of 07/29/2020  Medication Sig  . amLODipine (NORVASC) 10 MG tablet TAKE 1 TABLET BY MOUTH ONCE A DAY.  Marland Kitchen atorvastatin (LIPITOR) 40 MG tablet TAKE 1 TABLET BY MOUTH ONCE A DAY.  Marland Kitchen Cyanocobalamin (VITAMIN B-12 PO) Take 1 tablet by mouth daily.  Marland Kitchen gabapentin (NEURONTIN) 300 MG capsule TAKE 1 CAPSULE BY MOUTH IN THE MORNING, 1 CAPSULE IN THE AFTERNOON, AND 2 CAPSULES AT BEDTIME.  . hydrochlorothiazide (HYDRODIURIL) 25 MG tablet TAKE ONE TABLET BY MOUTH ONCE DAILY.  Marland Kitchen terazosin (HYTRIN) 2 MG capsule TAKE ONE CAPSULE BY MOUTH AT BEDTIME.   No facility-administered encounter medications on file as of 07/29/2020.     Current Diagnosis/Assessment:   Emergency planning/management officer Strain: Low Risk   . Difficulty of Paying Living Expenses: Not very hard    Goals Addressed            This Visit's Progress   . Pharmacy Care Plan:       CARE PLAN ENTRY (see longitudinal plan of care for additional care plan information)  Current Barriers:  . Chronic Disease Management support, education, and care coordination needs related to HTN, CKD.   Hypertension BP Readings from Last 3 Encounters:  06/09/20 132/78  11/12/19 126/64  10/22/19 (!) 155/90   . Pharmacist Clinical Goal(s): o Over the next 180 days, patient will work with PharmD and providers to maintain BP goal <140/90 . Current regimen:  o Amlodipine 10mg  o HCTZ 25mg  daily . Interventions: o Reviewed home monitoring  practices o Discussed medication adherence . Patient self care activities - Over the next 180 days, patient will: o Check BP periodically, document, and provide at future appointments o Ensure daily salt intake < 2300 mg/day  CKD . Pharmacist Clinical Goal(s) o Over the next 180 days, patient will work with PharmD and providers to optimize medication related to CKD. . Current regimen:  o NO MEDS . Interventions: o Reviewed medication profile for safety with current kidney function. . Patient self care activities - Over the next 180 days, patient will: o Continue to take medications as directed o Maintain adequate hydration   Initial goal documentation        Hypertension   Office blood pressures are  BP Readings from Last 3 Encounters:  06/09/20 132/78  11/12/19 126/64  10/22/19 (!) 155/90    Patient has failed these meds in the past: none noted  Patient checks BP at home several times per month Patient is currently controlled on the following medications:   Amlodipine 10mg   HCTZ 25mg  tablet  Patient home BP readings are ranging: no log available   Patient very adherent to medications.  Denies swelling, dizziness, headaches.  No adverse effects noted.  Mows his own law as well as sister in laws.  Also walks a block or so daily to his sister in laws to get mail and back home.  No other physical activity.  Plan  Continue current medications     CKD  Kidney Function Lab Results  Component Value Date/Time   CREATININE 1.40 (H) 06/09/2020 03:54 PM   CREATININE 1.30 (H) 11/12/2019 02:32 PM   GFRNONAA 47 (L) 10/08/2018 02:41 PM   GFRAA 55 (L) 10/08/2018 02:41 PM   K 4.5 06/09/2020 03:54 PM   K 3.9 11/12/2019 02:32 PM      Evaluated current medication profile for safety with kidney function.  Plan  Continue current medications  Tobacco   Tobacco Status:  Social History   Tobacco Use  Smoking Status Current Every Day Smoker  . Packs/day: 1.00  .  Years: 50.00  . Pack years: 50.00  . Types: Cigarettes  Smokeless Tobacco Never Used  Tobacco Comment   1/2 to 1 pack x50 yrs.    Smoking status unchanged, patient not in mindset to quit right now.  Plan  Continue smoking cessation counseling.  Vaccines   Reviewed and discussed patient's vaccination history.    Immunization History  Administered Date(s) Administered  . Influenza, High Dose Seasonal PF 09/19/2016, 09/26/2018, 09/11/2019  . Influenza,inj,Quad PF,6+ Mos 11/12/2013, 09/09/2014, 11/18/2015, 09/07/2017  . Influenza-Unspecified 08/12/2019  . Moderna SARS-COVID-2 Vaccination 04/07/2020, 05/15/2020  . Pneumococcal Conjugate-13 11/12/2013  . Pneumococcal Polysaccharide-23 10/26/2012  . Zoster Recombinat (Shingrix) 01/14/2020, 03/16/2020    Plan  COVID-19 booster shot in February. Medication Management   . Miscellaneous medications:  o Terazosin 2mg  daily . OTC's:  o B12 daily . Patient currently uses [facility].  Phone #  [phone] . Patient reports using no specific method to organize medications and promote adherence. . Patient denies missed doses of medication.   Beverly Milch, PharmD Clinical Pharmacist [place] [phone]

## 2020-07-28 ENCOUNTER — Telehealth: Payer: Self-pay | Admitting: Pharmacist

## 2020-07-28 NOTE — Chronic Care Management (AMB) (Addendum)
    Chronic Care Management Pharmacy Assistant   Name: Eddie [name]  MRN: [id number] DOB: [birth year]  Reason for Encounter: Medication Review/ Initial Questions for Initial Pharmacist Visit on 07/29/20.  Patient Questions: Have you seen any other providers since your last visit? **no Any changes in your medications or health? no Any side effects from any medications? no Do you have an symptoms or problems not managed by your medications? no Any concerns about your health right now? No  Has your provider asked that you check blood pressure, blood sugar, or follow special diet at home? Yes- Patient may check blood pressure once a week or every other week depending on what he eats. Patient admits to eating pork a lot but is trying to cut back.  Do you get any type of exercise on a regular basis? Yes- Patient states he walks a lot and does his yard work.   Can you think of a goal you would like to reach for your health? No Do you have any problems getting your medications? No  Is there anything that you would like to discuss during the appointment? Patient would like to discuss shingles pain that he still gets almost a year ago. He was given Gabapentin by Dr Buelah Manis but still having some pain in that area he had shingles, left naval area that radiates to the back, feels it more where he had a feeding tube placed.   Patient aware to please bring medications, supplements and any blood pressure logs to appointment on 07/29/20.     PCP : [name], MD  Allergies:   Allergies  Allergen Reactions   Motrin [Ibuprofen] Swelling    Medications: Outpatient Encounter Medications as of 07/28/2020  Medication Sig   amLODipine (NORVASC) 10 MG tablet TAKE 1 TABLET BY MOUTH ONCE A DAY.   atorvastatin (LIPITOR) 40 MG tablet TAKE 1 TABLET BY MOUTH ONCE A DAY.   Cyanocobalamin (VITAMIN B-12 PO) Take 1 tablet by mouth daily.   gabapentin (NEURONTIN) 300 MG capsule TAKE 1 CAPSULE BY MOUTH IN THE  MORNING, 1 CAPSULE IN THE AFTERNOON, AND 2 CAPSULES AT BEDTIME.   hydrochlorothiazide (HYDRODIURIL) 25 MG tablet TAKE ONE TABLET BY MOUTH ONCE DAILY.   terazosin (HYTRIN) 2 MG capsule TAKE ONE CAPSULE BY MOUTH AT BEDTIME.   No facility-administered encounter medications on file as of 07/28/2020.    Current Diagnosis: Patient Active Problem List   Diagnosis Date Noted   Post herpetic neuralgia 10/09/2019   History of colonic polyps 12/13/2018   Hyponatremia 03/11/2016   Carotid artery disease ([place]) 01/22/2016   Wheezing 07/21/2015   History of esophageal cancer 07/15/2014   Routine general medical examination at a health care facility 07/02/2014   CKD (chronic kidney disease) stage 3, GFR 30-59 ml/min (HCC) 07/02/2014   Adenoma of large intestine 01/27/2014   Seasonal allergies 04/03/2013   Duodenal ulcer 10/26/2012   Edentulous 10/02/2012   Squamous cell carcinoma of esophagus ([place]) 09/25/2012   Essential hypertension, benign 07/29/2012   Alcohol use 07/29/2012   Pulmonary emphysema ([place]) 07/29/2012   Tobacco use 07/29/2012     Follow-Up:  Pharmacist Review- Patient has questions regarding lingering pain from Shingles.  Pattricia Boss, [place] Pharmacist Assistant [phone]

## 2020-07-29 ENCOUNTER — Other Ambulatory Visit: Payer: Self-pay

## 2020-07-29 ENCOUNTER — Ambulatory Visit: Payer: Medicare Other | Admitting: Pharmacist

## 2020-07-29 DIAGNOSIS — Z720 Tobacco use: Secondary | ICD-10-CM

## 2020-07-29 DIAGNOSIS — N1832 Chronic kidney disease, stage 3b: Secondary | ICD-10-CM

## 2020-07-29 DIAGNOSIS — I10 Essential (primary) hypertension: Secondary | ICD-10-CM

## 2020-07-29 NOTE — Patient Instructions (Addendum)
Visit Information Thank you for meeting with me today!  I look forward to working with you to help you meet all of your healthcare goals and answer any questions you may have.  Feel free to contact me anytime!  Goals Addressed            This Visit's Progress   . Pharmacy Care Plan:       CARE PLAN ENTRY (see longitudinal plan of care for additional care plan information)  Current Barriers:  . Chronic Disease Management support, education, and care coordination needs related to HTN, CKD.   Hypertension BP Readings from Last 3 Encounters:  06/09/20 132/78  11/12/19 126/64  10/22/19 (!) 155/90   . Pharmacist Clinical Goal(s): o Over the next 180 days, patient will work with PharmD and providers to maintain BP goal <140/90 . Current regimen:  o Amlodipine 10mg  o HCTZ 25mg  daily . Interventions: o Reviewed home monitoring practices o Discussed medication adherence . Patient self care activities - Over the next 180 days, patient will: o Check BP periodically, document, and provide at future appointments o Ensure daily salt intake < 2300 mg/day  CKD . Pharmacist Clinical Goal(s) o Over the next 180 days, patient will work with PharmD and providers to optimize medication related to CKD. . Current regimen:  o NO MEDS . Interventions: o Reviewed medication profile for safety with current kidney function. . Patient self care activities - Over the next 180 days, patient will: o Continue to take medications as directed o Maintain adequate hydration   Initial goal documentation        Mr. Mcgaugh was given information about Chronic Care Management services today including:  1. CCM service includes personalized support from designated clinical staff supervised by his physician, including individualized plan of care and coordination with other care providers 2. 24/7 contact phone numbers for assistance for urgent and routine care needs. 3. Standard insurance, coinsurance, copays  and deductibles apply for chronic care management only during months in which we provide at least 20 minutes of these services. Most insurances cover these services at 100%, however patients may be responsible for any copay, coinsurance and/or deductible if applicable. This service may help you avoid the need for more expensive face-to-face services. 4. Only one practitioner may furnish and bill the service in a calendar month. 5. The patient may stop CCM services at any time (effective at the end of the month) by phone call to the office staff.  Patient agreed to services and verbal consent obtained.   The patient verbalized understanding of instructions provided today and agreed to receive a mailed copy of patient instruction and/or educational materials. Telephone follow up appointment with pharmacy team member scheduled for: 6 months  Beverly Milch, PharmD Clinical Pharmacist [facility] [phone]   Health Risks of Smoking Smoking cigarettes is very bad for your health. Tobacco smoke has over 200 known poisons in it. It contains the poisonous gases nitrogen oxide and carbon monoxide. There are over 60 chemicals in tobacco smoke that cause cancer. Smoking is difficult to quit because a chemical in tobacco, called nicotine, causes addiction or dependence. When you smoke and inhale, nicotine is absorbed rapidly into the bloodstream through your lungs. Both inhaled and non-inhaled nicotine may be addictive. What are the risks of cigarette smoke? Cigarette smokers have an increased risk of many serious medical problems, including:  Lung cancer.  Lung disease, such as pneumonia, bronchitis, and emphysema.  Chest pain (angina) and  heart attack because the heart is not getting enough oxygen.  Heart disease and peripheral blood vessel disease.  High blood pressure (hypertension).  Stroke.  Oral cancer, including cancer of the lip, mouth, or voice box.  Bladder  cancer.  Pancreatic cancer.  Cervical cancer.  Pregnancy complications, including premature birth.  Stillbirths and smaller newborn babies, birth defects, and genetic damage to sperm.  Early menopause.  Lower estrogen level for women.  Infertility.  Facial wrinkles.  Blindness.  Increased risk of broken bones (fractures).  Senile dementia.  Stomach ulcers and internal bleeding.  Delayed wound healing and increased risk of complications during surgery.  Even smoking lightly shortens your life expectancy by several years. Because of secondhand smoke exposure, children of smokers have an increased risk of the following:  Sudden infant death syndrome (SIDS).  Respiratory infections.  Lung cancer.  Heart disease.  Ear infections. What are the benefits of quitting? There are many health benefits of quitting smoking. Here are some of them:  Within days of quitting smoking, your risk of having a heart attack decreases, your blood flow improves, and your lung capacity improves. Blood pressure, pulse rate, and breathing patterns start returning to normal soon after quitting.  Within months, your lungs may clear up completely.  Quitting for 10 years reduces your risk of developing lung cancer and heart disease to almost that of a nonsmoker.  People who quit may see an improvement in their overall quality of life. How do I quit smoking?     Smoking is an addiction with both physical and psychological effects, and longtime habits can be hard to change. Your health care provider can recommend:  Programs and community resources, which may include group support, education, or talk therapy.  Prescription medicines to help reduce cravings.  Nicotine replacement products, such as patches, gum, and nasal sprays. Use these products only as directed. Do not replace cigarette smoking with electronic cigarettes, which are commonly called e-cigarettes. The safety of e-cigarettes is  not known, and some may contain harmful chemicals.  A combination of two or more of these methods. Where to find more information  American Lung Association: www.lung.org  American Cancer Society: www.cancer.org Summary  Smoking cigarettes is very bad for your health. Cigarette smokers have an increased risk of many serious medical problems, including several cancers, heart disease, and stroke.  Smoking is an addiction with both physical and psychological effects, and longtime habits can be hard to change.  By stopping right away, you can greatly reduce the risk of medical problems for you and your family.  To help you quit smoking, your health care provider can recommend programs, community resources, prescription medicines, and nicotine replacement products such as patches, gum, and nasal sprays. This information is not intended to replace advice given to you by your health care provider. Make sure you discuss any questions you have with your health care provider. Document Revised: 02/22/2018 Document Reviewed: 11/25/2016 Elsevier Patient Education  2020 [employer].

## 2020-07-31 ENCOUNTER — Other Ambulatory Visit: Payer: Self-pay | Admitting: *Deleted

## 2020-07-31 ENCOUNTER — Other Ambulatory Visit: Payer: Self-pay | Admitting: Family Medicine

## 2020-07-31 DIAGNOSIS — N1832 Chronic kidney disease, stage 3b: Secondary | ICD-10-CM

## 2020-07-31 DIAGNOSIS — I10 Essential (primary) hypertension: Secondary | ICD-10-CM

## 2020-07-31 DIAGNOSIS — J432 Centrilobular emphysema: Secondary | ICD-10-CM

## 2020-07-31 DIAGNOSIS — I6523 Occlusion and stenosis of bilateral carotid arteries: Secondary | ICD-10-CM

## 2020-07-31 DIAGNOSIS — N182 Chronic kidney disease, stage 2 (mild): Secondary | ICD-10-CM

## 2020-08-31 ENCOUNTER — Other Ambulatory Visit: Payer: Self-pay | Admitting: Family Medicine

## 2020-09-30 ENCOUNTER — Other Ambulatory Visit: Payer: Self-pay | Admitting: Family Medicine

## 2020-10-06 ENCOUNTER — Other Ambulatory Visit: Payer: Self-pay | Admitting: Family Medicine

## 2020-10-20 ENCOUNTER — Encounter: Payer: Self-pay | Admitting: Family Medicine

## 2020-10-20 ENCOUNTER — Other Ambulatory Visit: Payer: Self-pay

## 2020-10-20 ENCOUNTER — Ambulatory Visit (INDEPENDENT_AMBULATORY_CARE_PROVIDER_SITE_OTHER): Payer: Medicare Other | Admitting: Family Medicine

## 2020-10-20 VITALS — BP 130/74 | HR 82 | Temp 98.0°F | Resp 14 | Ht 67.0 in | Wt 140.0 lb

## 2020-10-20 DIAGNOSIS — J432 Centrilobular emphysema: Secondary | ICD-10-CM | POA: Diagnosis not present

## 2020-10-20 DIAGNOSIS — N1832 Chronic kidney disease, stage 3b: Secondary | ICD-10-CM | POA: Diagnosis not present

## 2020-10-20 DIAGNOSIS — B0229 Other postherpetic nervous system involvement: Secondary | ICD-10-CM

## 2020-10-20 DIAGNOSIS — Z0001 Encounter for general adult medical examination with abnormal findings: Secondary | ICD-10-CM | POA: Diagnosis not present

## 2020-10-20 DIAGNOSIS — I10 Essential (primary) hypertension: Secondary | ICD-10-CM | POA: Diagnosis not present

## 2020-10-20 DIAGNOSIS — Z0000 Encounter for general adult medical examination without abnormal findings: Secondary | ICD-10-CM

## 2020-10-20 DIAGNOSIS — Z720 Tobacco use: Secondary | ICD-10-CM

## 2020-10-20 DIAGNOSIS — Z23 Encounter for immunization: Secondary | ICD-10-CM

## 2020-10-20 NOTE — Addendum Note (Signed)
Addended by: Sheral Flow on: 10/20/2020 03:57 PM   Modules accepted: Orders

## 2020-10-20 NOTE — Progress Notes (Signed)
Subjective:   Patient presents for Medicare Annual/Subsequent preventive examination.   Pt here for wellness visit He is now getting over a head cold, he took dayquil and [place] No fever. No difficulty breathing   HTN- taking HCTZ and norvasc   Hyperlipidemia- taking liptor  He still uses gabapentin prn, he is down to mostly 1 days  For post herpetic neuralgia, overall symptoms much better  He has been drinking ensure once a day typically before bedtime, weight has bene consistent   Prevegen he has been taking for the past few months and doing crossroad puzzles   History of esophogeal cancer  he continues to smoke, but chewing nicorette gum  pack last 1.5 days    Review Past Medical/Family/Social: Per EMR    Risk Factors  Current exercise habits: walks, cuts grass ,chores  Dietary issues discussed: no major concerns   Cardiac risk factors: sMOKER, htn , HYPERLIPIDEMIA   Depression Screen  (Note: if answer to either of the following is "Yes", a more complete depression screening is indicated)  Over the past two weeks, have you felt down, depressed or hopeless? No Over the past two weeks, have you felt little interest or pleasure in doing things? No Have you lost interest or pleasure in daily life? No Do you often feel hopeless? No Do you cry easily over simple problems? No   Activities of Daily Living  In your present state of health, do you have any difficulty performing the following activities?:  Driving? No  Managing money? No  Feeding yourself? No  Getting from bed to chair? No  Climbing a flight of stairs? No  Preparing food and eating?: No  Bathing or showering? No  Getting dressed: No  Getting to the toilet? No  Using the toilet:No  Moving around from place to place: No  In the past year have you fallen or had a near fall?:No    Hearing Difficulties: No  Do you often ask people to speak up or repeat themselves? No  Do you experience ringing or  noises in your ears? No Do you have difficulty understanding soft or whispered voices? No  Do you feel that you have a problem with memory? No Do you often misplace items? No  Do you feel safe at home? Yes  Cognitive Testing  Alert? Yes Normal Appearance?Yes  Oriented to person? Yes Place? Yes  Time? Yes  Recall of three objects? Yes  Can perform simple calculations? Yes  Displays appropriate judgment?Yes  Can read the correct time from a watch face?Yes   List the Names of Other Physician/Practitioners you currently use:   GI Dr. Laural Golden   Screening Tests / Date Colonoscopy   UTD                  Zostavax  UTD PNA- Due  Influenza Vaccine  Due COVID-19 UTD  Tetanus/tdap due  ROSL  GEN- denies fatigue, fever, weight loss,weakness, recent illness HEENT- denies eye drainage, change in vision, nasal discharge, CVS- denies chest pain, palpitations RESP- denies SOB, cough, wheeze ABD- denies N/V, change in stools, abd pain GU- denies dysuria, hematuria, dribbling, incontinence MSK- denies joint pain, muscle aches, injury Neuro- denies headache, dizziness, syncope, seizure activity  GEN- NAD, alert and oriented x3 HEENT- PERRL, EOMI, non injected sclera, pink conjunctiva, MMM, oropharynx clear,wears glasses , TM clear no effusion  Neck- Supple, no thryomegaly CVS- RRR, no murmur RESP-CTAB ABD-NABS,soft,nt ND  EXT- No edema Pulses- Radial, DP- 2+  Assessment:    Annual wellness medicare exam   Plan:    During the course of the visit the patient was educated and counseled about appropriate screening and preventive services including:   Immunizations- FLu and PNA 23 given , he can look into price of TDAP at pharmacy  Emphysema, recent URI resolved, no inhalers, continues to smoke but using nicorette gum to help  History of esophageal cancer he has follow-up yearly with gastroenterology.  Hypertension controlled no change in medication he does have underlying CKD we  will check metabolic panel today CBC.  Lipids have been at goal.  His weight has been steady he has been drinking Ensure before bedtime I think that is contributing to his belly  Advised that he does not have to drive daily now  Handout given for advanced directives    Post herpetic neuralgia he is weaning off gabapentin   Audit C/Fall/Depression screen neg  F/U 6 months    Diet review for nutrition referral? Yes ____ Not Indicated __x__  Patient Instructions (the written plan) was given to the patient.  Medicare Attestation  I have personally reviewed:  The patient's medical and social history  Their use of alcohol, tobacco or illicit drugs  Their current medications and supplements  The patient's functional ability including ADLs,fall risks, home safety risks, cognitive, and hearing and visual impairment  Diet and physical activities  Evidence for depression or mood disorders  The patient's weight, height, BMI, and visual acuity have been recorded in the chart. I have made referrals, counseling, and provided education to the patient based on review of the above and I have provided the patient with a written personalized care plan for preventive services.

## 2020-10-20 NOTE — Patient Instructions (Addendum)
F/U 6 months  Flu shot and pneumonia shot given We will call with lab results

## 2020-10-21 ENCOUNTER — Other Ambulatory Visit: Payer: Self-pay | Admitting: *Deleted

## 2020-10-21 DIAGNOSIS — D72829 Elevated white blood cell count, unspecified: Secondary | ICD-10-CM

## 2020-10-21 DIAGNOSIS — N179 Acute kidney failure, unspecified: Secondary | ICD-10-CM

## 2020-10-21 LAB — COMPREHENSIVE METABOLIC PANEL
AG Ratio: 1.8 (calc) (ref 1.0–2.5)
ALT: 17 U/L (ref 9–46)
AST: 23 U/L (ref 10–35)
Albumin: 4.8 g/dL (ref 3.6–5.1)
Alkaline phosphatase (APISO): 81 U/L (ref 35–144)
BUN/Creatinine Ratio: 14 (calc) (ref 6–22)
BUN: 25 mg/dL (ref 7–25)
CO2: 22 mmol/L (ref 20–32)
Calcium: 9.9 mg/dL (ref 8.6–10.3)
Chloride: 97 mmol/L — ABNORMAL LOW (ref 98–110)
Creat: 1.84 mg/dL — ABNORMAL HIGH (ref 0.70–1.18)
Globulin: 2.7 g/dL (calc) (ref 1.9–3.7)
Glucose, Bld: 89 mg/dL (ref 65–99)
Potassium: 4.5 mmol/L (ref 3.5–5.3)
Sodium: 133 mmol/L — ABNORMAL LOW (ref 135–146)
Total Bilirubin: 0.9 mg/dL (ref 0.2–1.2)
Total Protein: 7.5 g/dL (ref 6.1–8.1)

## 2020-10-21 LAB — CBC WITH DIFFERENTIAL/PLATELET
Absolute Monocytes: 1006 cells/uL — ABNORMAL HIGH (ref 200–950)
Basophils Absolute: 109 cells/uL (ref 0–200)
Basophils Relative: 0.8 %
Eosinophils Absolute: 340 cells/uL (ref 15–500)
Eosinophils Relative: 2.5 %
HCT: 46.5 % (ref 38.5–50.0)
Hemoglobin: 15.8 g/dL (ref 13.2–17.1)
Lymphs Abs: 2638 cells/uL (ref 850–3900)
MCH: 32.7 pg (ref 27.0–33.0)
MCHC: 34 g/dL (ref 32.0–36.0)
MCV: 96.3 fL (ref 80.0–100.0)
MPV: 9.7 fL (ref 7.5–12.5)
Monocytes Relative: 7.4 %
Neutro Abs: 9506 cells/uL — ABNORMAL HIGH (ref 1500–7800)
Neutrophils Relative %: 69.9 %
Platelets: 251 10*3/uL (ref 140–400)
RBC: 4.83 10*6/uL (ref 4.20–5.80)
RDW: 10.7 % — ABNORMAL LOW (ref 11.0–15.0)
Total Lymphocyte: 19.4 %
WBC: 13.6 10*3/uL — ABNORMAL HIGH (ref 3.8–10.8)

## 2020-10-28 ENCOUNTER — Other Ambulatory Visit: Payer: Self-pay

## 2020-10-28 ENCOUNTER — Other Ambulatory Visit: Payer: Medicare Other

## 2020-10-28 DIAGNOSIS — D72829 Elevated white blood cell count, unspecified: Secondary | ICD-10-CM

## 2020-10-28 DIAGNOSIS — N179 Acute kidney failure, unspecified: Secondary | ICD-10-CM | POA: Diagnosis not present

## 2020-10-29 LAB — CBC WITH DIFFERENTIAL/PLATELET
Absolute Monocytes: 932 cells/uL (ref 200–950)
Basophils Absolute: 145 cells/uL (ref 0–200)
Basophils Relative: 1.2 %
Eosinophils Absolute: 387 cells/uL (ref 15–500)
Eosinophils Relative: 3.2 %
HCT: 45.4 % (ref 38.5–50.0)
Hemoglobin: 15 g/dL (ref 13.2–17.1)
Lymphs Abs: 2468 cells/uL (ref 850–3900)
MCH: 31.9 pg (ref 27.0–33.0)
MCHC: 33 g/dL (ref 32.0–36.0)
MCV: 96.6 fL (ref 80.0–100.0)
MPV: 9.6 fL (ref 7.5–12.5)
Monocytes Relative: 7.7 %
Neutro Abs: 8168 cells/uL — ABNORMAL HIGH (ref 1500–7800)
Neutrophils Relative %: 67.5 %
Platelets: 261 10*3/uL (ref 140–400)
RBC: 4.7 10*6/uL (ref 4.20–5.80)
RDW: 10.7 % — ABNORMAL LOW (ref 11.0–15.0)
Total Lymphocyte: 20.4 %
WBC: 12.1 10*3/uL — ABNORMAL HIGH (ref 3.8–10.8)

## 2020-10-29 LAB — BASIC METABOLIC PANEL WITH GFR
BUN/Creatinine Ratio: 13 (calc) (ref 6–22)
BUN: 19 mg/dL (ref 7–25)
CO2: 24 mmol/L (ref 20–32)
Calcium: 9.9 mg/dL (ref 8.6–10.3)
Chloride: 105 mmol/L (ref 98–110)
Creat: 1.52 mg/dL — ABNORMAL HIGH (ref 0.70–1.18)
GFR, Est African American: 52 mL/min/{1.73_m2} — ABNORMAL LOW (ref >=60)
GFR, Est Non African American: 45 mL/min/{1.73_m2} — ABNORMAL LOW (ref >=60)
Glucose, Bld: 92 mg/dL (ref 65–99)
Potassium: 4.6 mmol/L (ref 3.5–5.3)
Sodium: 137 mmol/L (ref 135–146)

## 2020-10-30 ENCOUNTER — Other Ambulatory Visit: Payer: Self-pay | Admitting: Family Medicine

## 2020-11-12 ENCOUNTER — Ambulatory Visit: Payer: Self-pay | Admitting: Pharmacist

## 2020-11-12 DIAGNOSIS — I10 Essential (primary) hypertension: Secondary | ICD-10-CM

## 2020-11-12 NOTE — Chronic Care Management (AMB) (Signed)
Chronic Care Management   Follow Up Note   11/12/2020 Name: Eddie [name] MRN: [id number] DOB: 12-28-1945  Referred by: [name], MD Reason for referral : No chief complaint on file.   Eddie [name] is a 74 y.o. year old male who is a primary care patient of [place], Modena Nunnery, MD. The CCM team was consulted for assistance with chronic disease management and care coordination needs.    Review of patient status, including review of consultants reports, relevant laboratory and other test results, and collaboration with appropriate care team members and the patient's provider was performed as part of comprehensive patient evaluation and provision of chronic care management services.    SDOH (Social Determinants of Health) assessments performed: No See Care Plan activities for detailed interventions related to [facility])     Outpatient Encounter Medications as of 11/12/2020  Medication Sig  . amLODipine (NORVASC) 10 MG tablet TAKE 1 TABLET BY MOUTH ONCE A DAY.  Marland Kitchen atorvastatin (LIPITOR) 40 MG tablet TAKE 1 TABLET BY MOUTH ONCE A DAY.  Marland Kitchen Cyanocobalamin (VITAMIN B-12 PO) Take 1 tablet by mouth daily.  Marland Kitchen gabapentin (NEURONTIN) 300 MG capsule TAKE 1 CAPSULE BY MOUTH IN THE MORNING, 1 CAPSULE IN THE AFTERNOON, AND 2 CAPSULES AT BEDTIME. (Patient taking differently: 1- 2 cap PO Daily)  . terazosin (HYTRIN) 2 MG capsule TAKE ONE CAPSULE BY MOUTH AT BEDTIME.   No facility-administered encounter medications on file as of 11/12/2020.     Reviewed chart prior to disease state call. Spoke with patient regarding BP Recent OV  10/20/2020 [facility]) - HCTZ was discontinued, patient was to monitor his BP and call if > 140/90.  Recent Office Vitals: BP Readings from Last 3 Encounters:  10/20/20 130/74  06/09/20 132/78  11/12/19 126/64   Pulse Readings from Last 3 Encounters:  10/20/20 82  06/09/20 70  11/12/19 78    Wt Readings from Last 3 Encounters:  10/20/20 140 lb (63.5 kg)  06/09/20 138 lb  (62.6 kg)  12/17/19 127 lb (57.6 kg)     Kidney Function Lab Results  Component Value Date/Time   CREATININE 1.52 (H) 10/28/2020 03:22 PM   CREATININE 1.84 (H) 10/20/2020 03:19 PM   GFRNONAA 45 (L) 10/28/2020 03:22 PM   GFRAA 52 (L) 10/28/2020 03:22 PM    BMP Latest Ref Rng & Units 10/28/2020 10/20/2020 06/09/2020  Glucose 65 - 99 mg/dL 92 89 91  BUN 7 - 25 mg/dL 19 25 23   Creatinine 0.70 - 1.18 mg/dL 1.52(H) 1.84(H) 1.40(H)  BUN/Creat Ratio 6 - 22 (calc) 13 14 16   Sodium 135 - 146 mmol/L 137 133(L) 131(L)  Potassium 3.5 - 5.3 mmol/L 4.6 4.5 4.5  Chloride 98 - 110 mmol/L 105 97(L) 98  CO2 20 - 32 mmol/L 24 22 21   Calcium 8.6 - 10.3 mg/dL 9.9 9.9 9.6    . Current antihypertensive regimen:  o Amlodipine 10mg  daily o Terazosin 2mg  . How often are you checking your Blood Pressure? daily . Current home BP readings: 126/80 was the only number he could recall, but most readings have been in this range . What recent interventions/DTPs have been made by any provider to improve Blood Pressure control since last CPP Visit:  o Patient is no longer taking HCTZ . Any recent hospitalizations or ED visits since last visit with CPP? No . What diet changes have been made to improve Blood Pressure Control?  o None . What exercise is being done to improve your Blood  Pressure Control?  o Patient is walking "a little bit"  Adherence Review: Is the patient currently on ACE/ARB medication? No Does the patient have >5 day gap between last estimated fill dates? No  Patient reports he is feeling well overall.  BP has been well controlled since d/c of HCTZ.  He reports he went a few days without his terazosin over the thanksgiving holiday due to office closure.  Appears he is only getting a 30 days supply of this medication, will request for 90 day supply to be called in to pharmacy to promote better adherence.  Patient already gets 90 days supply on all other chronic medications.  Counseled patient to  call me a few days before he is running out of any medications so that we can make sure that this does not happen.  Beverly Milch, PharmD Clinical Pharmacist [place] [phone]

## 2020-11-17 ENCOUNTER — Telehealth: Payer: Self-pay | Admitting: *Deleted

## 2020-11-17 MED ORDER — TERAZOSIN HCL 2 MG PO CAPS
2.0000 mg | ORAL_CAPSULE | Freq: Every day | ORAL | 1 refills | Status: DC
Start: 2020-11-17 — End: 2021-03-01

## 2020-11-17 NOTE — Telephone Encounter (Signed)
-----   Message from Edythe Clarity, [facility] sent at 11/17/2020 10:40 AM EST ----- Marykay Lex,  This patient has requested we call in a 90 day Rx for his terazosin to [place] if that is ok with Dr. Buelah Manis,  Thanks! Beverly Milch, PharmD Clinical Pharmacist [employer] [phone]

## 2020-11-17 NOTE — Telephone Encounter (Signed)
Prescription sent to pharmacy.

## 2020-12-02 ENCOUNTER — Other Ambulatory Visit: Payer: Self-pay | Admitting: Family Medicine

## 2020-12-22 ENCOUNTER — Ambulatory Visit (INDEPENDENT_AMBULATORY_CARE_PROVIDER_SITE_OTHER): Payer: Medicare Other | Admitting: Internal Medicine

## 2020-12-28 ENCOUNTER — Other Ambulatory Visit: Payer: Self-pay

## 2020-12-28 ENCOUNTER — Encounter (INDEPENDENT_AMBULATORY_CARE_PROVIDER_SITE_OTHER): Payer: Self-pay | Admitting: Internal Medicine

## 2020-12-28 ENCOUNTER — Telehealth (INDEPENDENT_AMBULATORY_CARE_PROVIDER_SITE_OTHER): Payer: Medicare Other | Admitting: Internal Medicine

## 2020-12-28 VITALS — Ht 67.0 in | Wt 140.0 lb

## 2020-12-28 DIAGNOSIS — Z860100 Personal history of colon polyps, unspecified: Secondary | ICD-10-CM

## 2020-12-28 DIAGNOSIS — Z7189 Other specified counseling: Secondary | ICD-10-CM

## 2020-12-28 DIAGNOSIS — Z8501 Personal history of malignant neoplasm of esophagus: Secondary | ICD-10-CM | POA: Diagnosis not present

## 2020-12-29 NOTE — Progress Notes (Signed)
Virtual Visit via Telephone Note  I connected with Eddie [name] on 12/29/20 at  3:30 PM EST by telephone and verified that I am speaking with the correct person using two identifiers.  Location: Patient: home Provider: office   I discussed the limitations, risks, security and privacy concerns of performing an evaluation and management service by telephone and the availability of in person appointments. I also discussed with the patient that there may be a patient responsible charge related to this service. The patient expressed understanding and agreed to proceed.   History of Present Illness:  Patient is 75 year old Afro-American male who has history of squamous cell carcinoma of esophagus diagnosed in 2013 treated with chemoradiation and has remained in remission.  He also has history of colonic adenoma.  His last colonoscopy was in November 2014.  He was scheduled for colonoscopy in February 2020.  Was postponed because of Covid.  It has not yet been scheduled.  Patient states he is doing well as far as his GI system is concerned he has good appetite.  His weight is stable.  He denies heartburn dysphagia nausea or vomiting.  He also denies abdominal pain melena or rectal bleeding.  His bowels move daily.  He states he stays busy and walks every day weather permitting. He is taking gabapentin for post herpetic neuralgia. He says Dr. Buelah Manis stopped his HCTZ back in November 2021 because his serum creatinine was elevated.  His family history is negative for CRC.    Current Outpatient Medications:  .  amLODipine (NORVASC) 10 MG tablet, TAKE 1 TABLET BY MOUTH ONCE A DAY., Disp: 90 tablet, Rfl: 0 .  atorvastatin (LIPITOR) 40 MG tablet, TAKE 1 TABLET BY MOUTH ONCE A DAY., Disp: 90 tablet, Rfl: 0 .  Cyanocobalamin (VITAMIN B-12 PO), Take 1 tablet by mouth daily., Disp: , Rfl:  .  gabapentin (NEURONTIN) 300 MG capsule, TAKE 1 CAPSULE BY MOUTH IN THE MORNING, 1 CAPSULE IN THE AFTERNOON, AND 2  CAPSULES AT BEDTIME., Disp: 120 capsule, Rfl: 0 .  terazosin (HYTRIN) 2 MG capsule, Take 1 capsule (2 mg total) by mouth at bedtime., Disp: 90 capsule, Rfl: 1   Observations/Objective:  Patient reported his weight to be 140 pounds.  He weighed 120 pounds in February 2020.  Lab data from 10/28/2020 reviewed WBC 12.1, H&H 15 and 45.4 and platelet count 261K. Glucose 92 BUN 19 and creatinine 1.52.  (Serum creatinine was 1.84 on 10/20/2020). Serum sodium 137, potassium 4.6, chloride 105, CO2 24 Serum calcium 9.9  LFTs from 10/20/2020 Bilirubin 0.9, AP 81, AST 20, ALT 12, total protein 7.0 and albumin 4.5.   Assessment and Plan:  #1.  History of colonic adenoma.  He had 8 mm pedunculated tubular adenoma removed in November 2014.  Colonoscopy was postponed 2 years ago because of Covid pandemic.  He is not having any symptoms pertaining to lower GI tract.  We will plan colonoscopy within the next 2 to 3 months.  #2.  History of esophageal squamous cell carcinoma.  He was diagnosed back in September 2013 when he required PEG followed by chemoradiation.  PEG was subsequently removed.  He has done very well.  He has no symptoms such as dysphagia.  No clear-cut guidelines for surveillance in this group of patients.  Will discuss with Dr. Delton Coombes.   Follow Up Instructions:  Surveillance colonoscopy to be scheduled within the next 2 to 3 months. Office visit in 1 year.   I discussed the assessment and  treatment plan with the patient. The patient was provided an opportunity to ask questions and all were answered. The patient agreed with the plan and demonstrated an understanding of the instructions.   The patient was advised to call back or seek an in-person evaluation if the symptoms worsen or if the condition fails to improve as anticipated.  I provided 10 minutes of non-face-to-face time during this encounter.   Hildred Laser, MD

## 2021-01-04 ENCOUNTER — Other Ambulatory Visit: Payer: Self-pay | Admitting: Family Medicine

## 2021-02-03 ENCOUNTER — Ambulatory Visit (INDEPENDENT_AMBULATORY_CARE_PROVIDER_SITE_OTHER): Payer: Medicare Other | Admitting: Pharmacist

## 2021-02-03 DIAGNOSIS — Z720 Tobacco use: Secondary | ICD-10-CM

## 2021-02-03 DIAGNOSIS — I10 Essential (primary) hypertension: Secondary | ICD-10-CM | POA: Diagnosis not present

## 2021-02-03 NOTE — Progress Notes (Signed)
 Chronic Care Management Pharmacy Note  02/03/2021 Name:  Eddie [name] MRN:  [id number] DOB:  07/24/1946  Subjective: Eddie [name] is an 74 y.o. year old male who is a primary patient of [place], Kawanta F, MD.  The CCM team was consulted for assistance with disease management and care coordination needs.    Engaged with patient by telephone for follow up visit in response to provider referral for pharmacy case management and/or care coordination services.   Consent to Services:  The patient was given the following information about Chronic Care Management services today, agreed to services, and gave verbal consent: 1. CCM service includes personalized support from designated clinical staff supervised by the primary care provider, including individualized plan of care and coordination with other care providers 2. 24/7 contact phone numbers for assistance for urgent and routine care needs. 3. Service will only be billed when office clinical staff spend 20 minutes or more in a month to coordinate care. 4. Only one practitioner may furnish and bill the service in a calendar month. 5.The patient may stop CCM services at any time (effective at the end of the month) by phone call to the office staff. 6. The patient will be responsible for cost sharing (co-pay) of up to 20% of the service fee (after annual deductible is met). Patient agreed to services and consent obtained.  Patient Care Team: [place], Kawanta F, MD as PCP - General (Family Medicine) Palermo, James, MD as Consulting Physician (Radiation Oncology) Neijstrom, Eric, MD as Consulting Physician (Hematology and Oncology) Rehman, Najeeb U, MD as Consulting Physician (Gastroenterology) ,  L, RPH as Pharmacist (Pharmacist)  Recent office visits: 10/20/2020 () - HCTZ was discontinued, patient was to monitor his BP and call if > 140/90.  Recent consult visits: 12/28/20 (Rehman, Gastro) - no concerns, scheduled colonoscopy for 2  to 3 months out  Hospital visits: None in previous 6 months  Objective:  Lab Results  Component Value Date   CREATININE 1.52 (H) 10/28/2020   BUN 19 10/28/2020   GFRNONAA 45 (L) 10/28/2020   GFRAA 52 (L) 10/28/2020   NA 137 10/28/2020   K 4.6 10/28/2020   CALCIUM 9.9 10/28/2020   CO2 24 10/28/2020    Lab Results  Component Value Date/Time   HGBA1C 4.7 04/02/2014 08:49 AM   HGBA1C 5.0 01/10/2013 01:00 PM    Last diabetic Eye exam: No results found for: HMDIABEYEEXA  Last diabetic Foot exam: No results found for: HMDIABFOOTEX   Lab Results  Component Value Date   CHOL 133 06/09/2020   HDL 67 06/09/2020   LDLCALC 47 06/09/2020   TRIG 109 06/09/2020   CHOLHDL 2.0 06/09/2020    Hepatic Function Latest Ref Rng & Units 10/20/2020 06/09/2020 05/13/2019  Total Protein 6.1 - 8.1 g/dL 7.5 7.0 7.0  Albumin 3.6 - 5.1 g/dL - - -  AST 10 - 35 U/L 23 20 27  ALT 9 - 46 U/L 17 12 17  Alk Phosphatase 40 - 115 U/L - - -  Total Bilirubin 0.2 - 1.2 mg/dL 0.9 0.9 1.0    Lab Results  Component Value Date/Time   TSH 2.24 03/15/2016 07:51 AM   FREET4 1.6 03/15/2016 07:51 AM    CBC Latest Ref Rng & Units 10/28/2020 10/20/2020 06/09/2020  WBC 3.8 - 10.8 Thousand/uL 12.1(H) 13.6(H) 10.4  Hemoglobin 13.2 - 17.1 g/dL 15.0 15.8 15.2  Hematocrit 38.5 - 50.0 % 45.4 46.5 45.5  Platelets 140 - 400 Thousand/uL 261 251 159      No results found for: VD25OH  Clinical ASCVD: No  The 10-year ASCVD risk score (Goff DC Jr., et al., 2013) is: 27.8%   Values used to calculate the score:     Age: 74 years     Sex: Male     Is Non-Hispanic African American: Yes     Diabetic: No     Tobacco smoker: Yes     Systolic Blood Pressure: 130 mmHg     Is BP treated: Yes     HDL Cholesterol: 67 mg/dL     Total Cholesterol: 133 mg/dL    Depression screen PHQ 2/9 10/20/2020 06/09/2020 11/12/2019  Decreased Interest 0 0 0  Down, Depressed, Hopeless 0 0 0  PHQ - 2 Score 0 0 0  Altered sleeping - - -  Tired,  decreased energy - - -  Change in appetite - - -  Feeling bad or failure about yourself  - - -  Trouble concentrating - - -  Moving slowly or fidgety/restless - - -  Suicidal thoughts - - -  PHQ-9 Score - - -  Difficult doing work/chores - - -     Social History   Tobacco Use  Smoking Status Current Every Day Smoker  . Packs/day: 1.00  . Years: 50.00  . Pack years: 50.00  . Types: Cigarettes  Smokeless Tobacco Never Used  Tobacco Comment   1/2 to 1 pack x50 yrs.   BP Readings from Last 3 Encounters:  10/20/20 130/74  06/09/20 132/78  11/12/19 126/64   Pulse Readings from Last 3 Encounters:  10/20/20 82  06/09/20 70  11/12/19 78   Wt Readings from Last 3 Encounters:  12/28/20 140 lb (63.5 kg)  10/20/20 140 lb (63.5 kg)  06/09/20 138 lb (62.6 kg)    Assessment/Interventions: Review of patient past medical history, allergies, medications, health status, including review of consultants reports, laboratory and other test data, was performed as part of comprehensive evaluation and provision of chronic care management services.   SDOH:  (Social Determinants of Health) assessments and interventions performed: No   CCM Care Plan  Allergies  Allergen Reactions  . Motrin [Ibuprofen] Swelling    Medications Reviewed Today    Reviewed by ,  L, RPH (Pharmacist) on 02/03/21 at 1339  Med List Status: <None>  Medication Order Taking? Sig Documenting Provider Last Dose Status Informant  amLODipine (NORVASC) 10 MG tablet [id number] Yes TAKE 1 TABLET BY MOUTH ONCE A DAY. [place], Kawanta F, MD Taking Active   atorvastatin (LIPITOR) 40 MG tablet [id number] Yes TAKE 1 TABLET BY MOUTH ONCE A DAY. [place], Kawanta F, MD Taking Active   Cyanocobalamin (VITAMIN B-12 PO) [id number] Yes Take 1 tablet by mouth daily. [provider] Taking Active Self  gabapentin (NEURONTIN) 300 MG capsule [id number] Yes TAKE 1 CAPSULE BY MOUTH IN THE MORNING, 1 CAPSULE IN THE AFTERNOON,  AND 2 CAPSULES AT BEDTIME. [place], Kawanta F, MD Taking Active            Med Note (TODD, TAMMY M   Mon Dec 28, 2020  3:56 PM) Patient states that he takes one by mouth at bedtime.  terazosin (HYTRIN) 2 MG capsule [id number] Yes Take 1 capsule (2 mg total) by mouth at bedtime. , Kawanta F, MD Taking Active           Patient Active Problem List   Diagnosis Date Noted  . Post herpetic neuralgia 10/09/2019  . History of colonic polyps 12/13/2018  . Hyponatremia 03/11/2016  .   Carotid artery disease (HCC) 01/22/2016  . Wheezing 07/21/2015  . History of esophageal cancer 07/15/2014  . Routine general medical examination at a health care facility 07/02/2014  . CKD (chronic kidney disease) stage 3, GFR 30-59 ml/min (HCC) 07/02/2014  . Adenoma of large intestine 01/27/2014  . Seasonal allergies 04/03/2013  . Duodenal ulcer 10/26/2012  . Edentulous 10/02/2012  . Squamous cell carcinoma of esophagus (HCC) 09/25/2012  . Essential hypertension, benign 07/29/2012  . Alcohol use 07/29/2012  . Pulmonary emphysema (HCC) 07/29/2012  . Tobacco use 07/29/2012    Immunization History  Administered Date(s) Administered  . Fluad Quad(high Dose 65+) 10/20/2020  . Influenza, High Dose Seasonal PF 09/19/2016, 09/26/2018, 09/11/2019  . Influenza,inj,Quad PF,6+ Mos 11/12/2013, 09/09/2014, 11/18/2015, 09/07/2017  . Influenza-Unspecified 08/12/2019  . Moderna Sars-Covid-2 Vaccination 04/07/2020, 05/15/2020  . Pneumococcal Conjugate-13 11/12/2013  . Pneumococcal Polysaccharide-23 10/26/2012, 10/20/2020  . Zoster Recombinat (Shingrix) 01/14/2020, 03/16/2020    Conditions to be addressed/monitored:  HTN, CKD, tobacco use.  Care Plan : General Pharmacy (Adult)  Updates made by ,  L, RPH since 02/03/2021 12:00 AM    Problem: HTN, CKD, Tobacco Abuse   Priority: High  Onset Date: 02/03/2021    Long-Range Goal: Patient-Specific Goal   Start Date: 02/03/2021  Expected End Date: 08/06/2021   This Visit's Progress: On track  Priority: High  Note:   Current Barriers:  . No current barriers to medication identified.  Pharmacist Clinical Goal(s):  . Over the next 120 days, patient will achieve adherence to monitoring guidelines and medication adherence to achieve therapeutic efficacy . maintain control of blood pressure as evidenced by home monitoring   . contact provider office for questions/concerns as evidenced notation of same in electronic health record through collaboration with PharmD and provider.   Interventions: . 1:1 collaboration with [place], Kawanta F, MD regarding development and update of comprehensive plan of care as evidenced by provider attestation and co-signature . Inter-disciplinary care team collaboration (see longitudinal plan of care) . Comprehensive medication review performed; medication list updated in electronic medical record  Hypertension/CKD (BP goal <140/90) -Controlled -Current treatment: ? Amlodipine 10mg -Medications previously tried: none noted  -Current home readings: 128/80s   -Current exercise habits: yardwork mowing yards, will walk the block daily to get his sisters mail and back home -Denies hypotensive/hypertensive symptoms -Denies swelling -BP has remained steady off of the HCTZ -Educated on BP goals and benefits of medications for prevention of heart attack, stroke and kidney damage; Importance of home blood pressure monitoring; -Counseled to monitor BP at home a few times per week, document, and provide log at future appointments -Recommended to continue current medication  Tobacco use (Goal Smoking cessation) -Uncontrolled -Previous quit attempts: none -Current treatment  . None -He reports not being ready to quit yet -Currently smoking 1 ppd -Recommended he reach out when he wants to establish a quit date and we will provide support  Patient Goals/Self-Care Activities . Over the next 120 days, patient will:  - take  medications as prescribed check blood pressure once or twice weekly, document, and provide at future appointments  Follow Up Plan: The care management team will reach out to the patient again over the next 120 days.         Medication Assistance: None required.  Patient affirms current coverage meets needs.  Patient's preferred pharmacy is:  [facility] - [place], [place] - [address] [place] [address] Phone: [phone] Fax: [phone]  Uses pill   box? No - does not have too many to organize Pt endorses 100% compliance  We discussed: Benefits of medication synchronization, packaging and delivery as well as enhanced pharmacist oversight with [employer]. Patient decided to: Continue current medication management strategy  Care Plan and Follow Up Patient Decision:  Patient agrees to Care Plan and Follow-up.  Plan: The care management team will reach out to the patient again over the next 120 days.

## 2021-02-03 NOTE — Patient Instructions (Addendum)
Visit Information  Goals Addressed            This Visit's Progress   . Manage My Medicine       Timeframe:  Long-Range Goal Priority:  Medium Start Date:    02/03/21                         Expected End Date:  08/06/21                     Follow Up Date 07/03/21   - call for medicine refill 2 or 3 days before it runs out - keep a list of all the medicines I take; vitamins and herbals too - use a pillbox to sort medicine    Why is this important?   . These steps will help you keep on track with your medicines.   Notes:       Patient Care Plan: General Pharmacy (Adult)    Problem Identified: HTN, CKD, Tobacco Abuse   Priority: High  Onset Date: 02/03/2021    Long-Range Goal: Patient-Specific Goal   Start Date: 02/03/2021  Expected End Date: 08/06/2021  This Visit's Progress: On track  Priority: High  Note:   Current Barriers:  . No current barriers to medication identified.  Pharmacist Clinical Goal(s):  Marland Kitchen Over the next 120 days, patient will achieve adherence to monitoring guidelines and medication adherence to achieve therapeutic efficacy . maintain control of blood pressure as evidenced by home monitoring   . contact provider office for questions/concerns as evidenced notation of same in electronic health record through collaboration with PharmD and provider.   Interventions: . 1:1 collaboration with Buelah Manis, Modena Nunnery, MD regarding development and update of comprehensive plan of care as evidenced by provider attestation and co-signature . Inter-disciplinary care team collaboration (see longitudinal plan of care) . Comprehensive medication review performed; medication list updated in electronic medical record  Hypertension/CKD (BP goal <140/90) -Controlled -Current treatment: ? Amlodipine 10mg  -Medications previously tried: none noted  -Current home readings: 128/80s   -Current exercise habits: yardwork mowing yards, will walk the block daily to get his sisters  mail and back home -Denies hypotensive/hypertensive symptoms -Denies swelling -BP has remained steady off of the HCTZ -Educated on BP goals and benefits of medications for prevention of heart attack, stroke and kidney damage; Importance of home blood pressure monitoring; -Counseled to monitor BP at home a few times per week, document, and provide log at future appointments -Recommended to continue current medication  Tobacco use (Goal Smoking cessation) -Uncontrolled -Previous quit attempts: none -Current treatment  . None -He reports not being ready to quit yet -Currently smoking 1 ppd -Recommended he reach out when he wants to establish a quit date and we will provide support  Patient Goals/Self-Care Activities . Over the next 120 days, patient will:  - take medications as prescribed check blood pressure once or twice weekly, document, and provide at future appointments  Follow Up Plan: The care management team will reach out to the patient again over the next 120 days.         The patient verbalized understanding of instructions, educational materials, and care plan provided today and agreed to receive a mailed copy of patient instructions, educational materials, and care plan.  Telephone follow up appointment with pharmacy team member scheduled for: 4 months  Edythe Clarity, [facility]  Managing Your Hypertension Hypertension, also called high blood pressure, is when the force  of the blood pressing against the walls of the arteries is too strong. Arteries are blood vessels that carry blood from your heart throughout your body. Hypertension forces the heart to work harder to pump blood and may cause the arteries to become narrow or stiff. Understanding blood pressure readings Your personal target blood pressure may vary depending on your medical conditions, your age, and other factors. A blood pressure reading includes a higher number over a lower number. Ideally, your blood  pressure should be below 120/80. You should know that:  The first, or top, number is called the systolic pressure. It is a measure of the pressure in your arteries as your heart beats.  The second, or bottom number, is called the diastolic pressure. It is a measure of the pressure in your arteries as the heart relaxes. Blood pressure is classified into four stages. Based on your blood pressure reading, your health care provider may use the following stages to determine what type of treatment you need, if any. Systolic pressure and diastolic pressure are measured in a unit called mmHg. Normal  Systolic pressure: below 470.  Diastolic pressure: below 80. Elevated  Systolic pressure: 962-836.  Diastolic pressure: below 80. Hypertension stage 1  Systolic pressure: 629-476.  Diastolic pressure: 54-65. Hypertension stage 2  Systolic pressure: 035 or above.  Diastolic pressure: 90 or above. How can this condition affect me? Managing your hypertension is an important responsibility. Over time, hypertension can damage the arteries and decrease blood flow to important parts of the body, including the brain, heart, and kidneys. Having untreated or uncontrolled hypertension can lead to:  A heart attack.  A stroke.  A weakened blood vessel (aneurysm).  Heart failure.  Kidney damage.  Eye damage.  Metabolic syndrome.  Memory and concentration problems.  Vascular dementia. What actions can I take to manage this condition? Hypertension can be managed by making lifestyle changes and possibly by taking medicines. Your health care provider will help you make a plan to bring your blood pressure within a normal range. Nutrition  Eat a diet that is high in fiber and potassium, and low in salt (sodium), added sugar, and fat. An example eating plan is called the Dietary Approaches to Stop Hypertension (DASH) diet. To eat this way: ? Eat plenty of fresh fruits and vegetables. Try to fill  one-half of your plate at each meal with fruits and vegetables. ? Eat whole grains, such as whole-wheat pasta, brown rice, or whole-grain bread. Fill about one-fourth of your plate with whole grains. ? Eat low-fat dairy products. ? Avoid fatty cuts of meat, processed or cured meats, and poultry with skin. Fill about one-fourth of your plate with lean proteins such as fish, chicken without skin, beans, eggs, and tofu. ? Avoid pre-made and processed foods. These tend to be higher in sodium, added sugar, and fat.  Reduce your daily sodium intake. Most people with hypertension should eat less than 1,500 mg of sodium a day.   Lifestyle  Work with your health care provider to maintain a healthy body weight or to lose weight. Ask what an ideal weight is for you.  Get at least 30 minutes of exercise that causes your heart to beat faster (aerobic exercise) most days of the week. Activities may include walking, swimming, or biking.  Include exercise to strengthen your muscles (resistance exercise), such as weight lifting, as part of your weekly exercise routine. Try to do these types of exercises for 30 minutes at least 3  days a week.  Do not use any products that contain nicotine or tobacco, such as cigarettes, e-cigarettes, and chewing tobacco. If you need help quitting, ask your health care provider.  Control any long-term (chronic) conditions you have, such as high cholesterol or diabetes.  Identify your sources of stress and find ways to manage stress. This may include meditation, deep breathing, or making time for fun activities.   Alcohol use  Do not drink alcohol if: ? Your health care provider tells you not to drink. ? You are pregnant, may be pregnant, or are planning to become pregnant.  If you drink alcohol: ? Limit how much you use to:  0-1 drink a day for women.  0-2 drinks a day for men. ? Be aware of how much alcohol is in your drink. In the [place] one drink equals one 12 oz  bottle of beer (355 mL), one 5 oz glass of wine (148 mL), or one 1 oz glass of hard liquor (44 mL). Medicines Your health care provider may prescribe medicine if lifestyle changes are not enough to get your blood pressure under control and if:  Your systolic blood pressure is 130 or higher.  Your diastolic blood pressure is 80 or higher. Take medicines only as told by your health care provider. Follow the directions carefully. Blood pressure medicines must be taken as told by your health care provider. The medicine does not work as well when you skip doses. Skipping doses also puts you at risk for problems. Monitoring Before you monitor your blood pressure:  Do not smoke, drink caffeinated beverages, or exercise within 30 minutes before taking a measurement.  Use the bathroom and empty your bladder (urinate).  Sit quietly for at least 5 minutes before taking measurements. Monitor your blood pressure at home as told by your health care provider. To do this:  Sit with your back straight and supported.  Place your feet flat on the floor. Do not cross your legs.  Support your arm on a flat surface, such as a table. Make sure your upper arm is at heart level.  Each time you measure, take two or three readings one minute apart and record the results. You may also need to have your blood pressure checked regularly by your health care provider.   General information  Talk with your health care provider about your diet, exercise habits, and other lifestyle factors that may be contributing to hypertension.  Review all the medicines you take with your health care provider because there may be side effects or interactions.  Keep all visits as told by your health care provider. Your health care provider can help you create and adjust your plan for managing your high blood pressure. Where to find more information  National Heart, Lung, and Blood Institute: https://wilson-eaton.com/  American Heart  Association: www.heart.org Contact a health care provider if:  You think you are having a reaction to medicines you have taken.  You have repeated (recurrent) headaches.  You feel dizzy.  You have swelling in your ankles.  You have trouble with your vision. Get help right away if:  You develop a severe headache or confusion.  You have unusual weakness or numbness, or you feel faint.  You have severe pain in your chest or abdomen.  You vomit repeatedly.  You have trouble breathing. These symptoms may represent a serious problem that is an emergency. Do not wait to see if the symptoms will go away. Get medical help right  away. Call your local emergency services (911 in the [place] Do not drive yourself to the hospital. Summary  Hypertension is when the force of blood pumping through your arteries is too strong. If this condition is not controlled, it may put you at risk for serious complications.  Your personal target blood pressure may vary depending on your medical conditions, your age, and other factors. For most people, a normal blood pressure is less than 120/80.  Hypertension is managed by lifestyle changes, medicines, or both.  Lifestyle changes to help manage hypertension include losing weight, eating a healthy, low-sodium diet, exercising more, stopping smoking, and limiting alcohol. This information is not intended to replace advice given to you by your health care provider. Make sure you discuss any questions you have with your health care provider. Document Revised: 12/27/2019 Document Reviewed: 10/22/2019 Elsevier Patient Education  2021 [employer].

## 2021-02-24 ENCOUNTER — Encounter (INDEPENDENT_AMBULATORY_CARE_PROVIDER_SITE_OTHER): Payer: Self-pay

## 2021-02-24 ENCOUNTER — Telehealth (INDEPENDENT_AMBULATORY_CARE_PROVIDER_SITE_OTHER): Payer: Self-pay

## 2021-02-24 ENCOUNTER — Other Ambulatory Visit (INDEPENDENT_AMBULATORY_CARE_PROVIDER_SITE_OTHER): Payer: Self-pay

## 2021-02-24 DIAGNOSIS — Z86010 Personal history of colonic polyps: Secondary | ICD-10-CM

## 2021-02-24 MED ORDER — PEG 3350-KCL-NA BICARB-NACL 420 G PO SOLR: 4000.0000 mL | ORAL | 0 refills | Status: DC

## 2021-02-24 NOTE — Telephone Encounter (Signed)
LeighAnn Lovelace, CMA  

## 2021-03-01 ENCOUNTER — Other Ambulatory Visit: Payer: Self-pay | Admitting: Family Medicine

## 2021-03-01 ENCOUNTER — Encounter: Payer: Self-pay | Admitting: Internal Medicine

## 2021-03-01 ENCOUNTER — Other Ambulatory Visit: Payer: Self-pay

## 2021-03-01 ENCOUNTER — Ambulatory Visit (INDEPENDENT_AMBULATORY_CARE_PROVIDER_SITE_OTHER): Payer: Medicare Other | Admitting: Internal Medicine

## 2021-03-01 VITALS — BP 140/80 | HR 84 | Resp 18 | Ht 67.0 in | Wt 145.0 lb

## 2021-03-01 DIAGNOSIS — Z7689 Persons encountering health services in other specified circumstances: Secondary | ICD-10-CM | POA: Diagnosis not present

## 2021-03-01 DIAGNOSIS — I10 Essential (primary) hypertension: Secondary | ICD-10-CM

## 2021-03-01 DIAGNOSIS — Z720 Tobacco use: Secondary | ICD-10-CM

## 2021-03-01 DIAGNOSIS — B0229 Other postherpetic nervous system involvement: Secondary | ICD-10-CM | POA: Diagnosis not present

## 2021-03-01 DIAGNOSIS — N400 Enlarged prostate without lower urinary tract symptoms: Secondary | ICD-10-CM

## 2021-03-01 DIAGNOSIS — J432 Centrilobular emphysema: Secondary | ICD-10-CM

## 2021-03-01 DIAGNOSIS — F17210 Nicotine dependence, cigarettes, uncomplicated: Secondary | ICD-10-CM

## 2021-03-01 DIAGNOSIS — N401 Enlarged prostate with lower urinary tract symptoms: Secondary | ICD-10-CM | POA: Insufficient documentation

## 2021-03-01 DIAGNOSIS — N1832 Chronic kidney disease, stage 3b: Secondary | ICD-10-CM | POA: Diagnosis not present

## 2021-03-01 DIAGNOSIS — I6523 Occlusion and stenosis of bilateral carotid arteries: Secondary | ICD-10-CM

## 2021-03-01 MED ORDER — ATORVASTATIN CALCIUM 40 MG PO TABS: 40.0000 mg | ORAL_TABLET | Freq: Every day | ORAL | 1 refills | Status: DC

## 2021-03-01 MED ORDER — GABAPENTIN 300 MG PO CAPS: 600.0000 mg | ORAL_CAPSULE | Freq: Every day | ORAL | 1 refills | Status: DC

## 2021-03-01 MED ORDER — TERAZOSIN HCL 2 MG PO CAPS: 2.0000 mg | ORAL_CAPSULE | Freq: Every day | ORAL | 1 refills | Status: DC

## 2021-03-01 MED ORDER — AMLODIPINE BESYLATE 10 MG PO TABS: 10.0000 mg | ORAL_TABLET | Freq: Every day | ORAL | 1 refills | Status: DC

## 2021-03-01 NOTE — Patient Instructions (Signed)
Please continue taking medications as prescribed.  Please continue to follow low sodium diet and perform moderate exercise/walking at least 150 mins/week.

## 2021-03-02 LAB — BASIC METABOLIC PANEL
BUN/Creatinine Ratio: 12 (ref 10–24)
BUN: 18 mg/dL (ref 8–27)
CO2: 19 mmol/L — ABNORMAL LOW (ref 20–29)
Calcium: 9.7 mg/dL (ref 8.6–10.2)
Chloride: 103 mmol/L (ref 96–106)
Creatinine, Ser: 1.54 mg/dL — ABNORMAL HIGH (ref 0.76–1.27)
Glucose: 86 mg/dL (ref 65–99)
Potassium: 5 mmol/L (ref 3.5–5.2)
Sodium: 139 mmol/L (ref 134–144)
eGFR: 48 mL/min/{1.73_m2} — ABNORMAL LOW (ref >59)

## 2021-03-05 NOTE — Assessment & Plan Note (Signed)
Asked about quitting: confirms that he currently smokes cigarettes Advise to quit smoking: Educated about QUITTING to reduce the risk of cancer, cardio and cerebrovascular disease. Assess willingness: Unwilling to quit at this time, but is working on cutting back. Assist with counseling and pharmacotherapy: Counseled for 5 minutes and literature provided. Arrange for follow up: Follow up in 3 months and continue to offer help. 

## 2021-03-05 NOTE — Progress Notes (Signed)
New Patient Office Visit  Subjective:  Patient ID: Eddie [name], male    DOB: [birth year]  Age: 75 y.o. MRN: [id number]  CC:  Chief Complaint  Patient presents with  . New Patient (Initial Visit)    New patient former dr Buelah Manis pt establishing care    HPI Eddie [name] is a 75 year old male with PMH of HTN, esophageal ca. S/p chemoradiation, COPD, postherpetic neuralgia, CKD and colonic polyps who presents for establishing care. He is a former patient of Dr Buelah Manis.  He has been doing well overall.  BP is well-controlled. Takes medications regularly. Patient denies headache, dizziness, chest pain, dyspnea or palpitations.  He follows up with GI for h/o esophageal ca., which has been in remission since treatment (2013).  He takes Gabapentin for postherpetic neuralgia. Denies any rash or pain currently.  He takes Terazosin for BPH.  He has had COVID vaccine.  Past Medical History:  Diagnosis Date  . Chronic back pain   . Duodenal ulcer 09/2012  . GERD (gastroesophageal reflux disease)   . Helicobacter pylori (H. pylori) infection 09/2012  . Hypertension   . Severe malnutrition ([place]) 10/26/2012  . 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 Squamous cell carcinoma of esophagus ([place]) 09/25/2012    Past Surgical History:  Procedure Laterality Date  . COLONOSCOPY N/A 12/26/2013   Procedure: COLONOSCOPY;  Surgeon: Rogene Houston, MD;  Location: [facility];  Service: Endoscopy;  Laterality: N/A;  830  . ESOPHAGOGASTRODUODENOSCOPY  09/21/2012   Procedure: ESOPHAGOGASTRODUODENOSCOPY (EGD);  Surgeon: Rogene Houston, MD;  Location: [facility];  Service: Endoscopy;  Laterality: N/A;  345  . PEG PLACEMENT  09/21/2012   Procedure: PERCUTANEOUS ENDOSCOPIC GASTROSTOMY (PEG) PLACEMENT;  Surgeon: Rogene Houston, MD;  Location: [facility];  Service: Endoscopy;  Laterality: N/A;  . PEG TUBE PLACEMENT  09/21/2012  . PORTACATH PLACEMENT    . REMOVAL OF GASTROSTOMY TUBE      august 2014    Family History  Problem Relation Age of Onset  . Stroke Mother   . Hypertension Father   . Cancer Brother        Throat cancer  . Kidney disease Daughter     Social History   Socioeconomic History  . Marital status: Married    Spouse name: Not on file  . Number of children: Not on file  . Years of education: Not on file  . Highest education level: Not on file  Occupational History  . Not on file  Tobacco Use  . Smoking status: Current Every Day Smoker    Packs/day: 1.00    Years: 50.00    Pack years: 50.00    Types: Cigarettes  . Smokeless tobacco: Never Used  . Tobacco comment: 1/2 to 1 pack x50 yrs.  Substance and Sexual Activity  . Alcohol use: Yes    Alcohol/week: 6.0 standard drinks    Types: 6 Cans of beer per week    Comment: Patient states he drinks a couple of beers almost daily in the afternoon,  . Drug use: No  . Sexual activity: Not on file  Other Topics Concern  . Not on file  Social History Narrative  . Not on file   Social Determinants of Health   Financial Resource Strain: Low Risk   . Difficulty of Paying Living Expenses: Not very hard  Food Insecurity: Not on file  Transportation Needs: Not on file  Physical Activity: Not on file  Stress: Not on file  Social Connections: Not on file  Intimate Partner Violence: Not on file    ROS Review of Systems  Constitutional: Negative for chills and fever.  HENT: Negative for congestion and sore throat.   Eyes: Negative for pain and discharge.  Respiratory: Negative for cough and shortness of breath.   Cardiovascular: Negative for chest pain and palpitations.  Gastrointestinal: Negative for constipation, diarrhea, nausea and vomiting.  Endocrine: Negative for polydipsia and polyuria.  Genitourinary: Negative for dysuria and hematuria.  Musculoskeletal: Negative for neck pain and neck stiffness.  Skin: Negative for rash.  Neurological: Negative for dizziness, weakness, numbness and  headaches.  Psychiatric/Behavioral: Negative for agitation and behavioral problems.    Objective:   Today's Vitals: BP 140/80 (BP Location: Left Arm, Patient Position: Sitting, Cuff Size: Normal)   Pulse 84   Resp 18   Ht 5\' 7"  (1.702 m)   Wt 145 lb (65.8 kg)   SpO2 97%   BMI 22.71 kg/m   Physical Exam Vitals reviewed.  Constitutional:      General: He is not in acute distress.    Appearance: He is not diaphoretic.  HENT:     Head: Normocephalic and atraumatic.     Nose: Nose normal.     Mouth/Throat:     Mouth: Mucous membranes are moist.  Eyes:     General: No scleral icterus.    Extraocular Movements: Extraocular movements intact.  Cardiovascular:     Rate and Rhythm: Normal rate and regular rhythm.     Pulses: Normal pulses.     Heart sounds: Normal heart sounds. No murmur heard.   Pulmonary:     Breath sounds: Normal breath sounds. No wheezing or rales.  Abdominal:     Palpations: Abdomen is soft.     Tenderness: There is no abdominal tenderness.  Musculoskeletal:     Cervical back: Neck supple. No tenderness.     Right lower leg: No edema.     Left lower leg: No edema.  Skin:    General: Skin is warm.     Findings: No rash.  Neurological:     General: No focal deficit present.     Mental Status: He is alert and oriented to person, place, and time.  Psychiatric:        Mood and Affect: Mood normal.        Behavior: Behavior normal.     Assessment & Plan:   Problem List Items Addressed This Visit      Cardiovascular and Mediastinum   Essential hypertension, benign    BP Readings from Last 1 Encounters:  03/01/21 140/80   Well-controlled with Amlodipine Counseled for compliance with the medications Advised DASH diet and moderate exercise/walking, at least 150 mins/week       Relevant Medications   amLODipine (NORVASC) 10 MG tablet   atorvastatin (LIPITOR) 40 MG tablet   terazosin (HYTRIN) 2 MG capsule   Carotid artery disease (HCC)    On  statin      Relevant Medications   amLODipine (NORVASC) 10 MG tablet   atorvastatin (LIPITOR) 40 MG tablet   terazosin (HYTRIN) 2 MG capsule     Respiratory   Pulmonary emphysema (HCC)    Denies any dyspnea or wheezing Not on any inhalers        Nervous and Auditory   Post herpetic neuralgia    On Gabapentin      Relevant Medications   gabapentin (NEURONTIN) 300 MG capsule  Genitourinary   CKD (chronic kidney disease) stage 3, GFR 30-59 ml/min (HCC)    Stable Check BMP Avoid nephrotoxic agents      Relevant Orders   Basic Metabolic Panel (BMET) (Completed)   Benign prostatic hyperplasia without lower urinary tract symptoms    On Terazosin      Relevant Medications   terazosin (HYTRIN) 2 MG capsule     Other   Tobacco use    Asked about quitting: confirms that he currently smokes cigarettes Advise to quit smoking: Educated about QUITTING to reduce the risk of cancer, cardio and cerebrovascular disease. Assess willingness: Unwilling to quit at this time, but is working on cutting back. Assist with counseling and pharmacotherapy: Counseled for 5 minutes and literature provided. Arrange for follow up: Follow up in 3 months and continue to offer help.      Encounter to establish care - Primary    Care established Previous chart reviewed History and medications reviewed with the patient         Outpatient Encounter Medications as of 03/01/2021  Medication Sig  . Cyanocobalamin (VITAMIN B-12 PO) Take 1 tablet by mouth daily.  . [DISCONTINUED] amLODipine (NORVASC) 10 MG tablet TAKE 1 TABLET BY MOUTH ONCE A DAY.  . [DISCONTINUED] atorvastatin (LIPITOR) 40 MG tablet TAKE 1 TABLET BY MOUTH ONCE A DAY.  . [DISCONTINUED] gabapentin (NEURONTIN) 300 MG capsule TAKE 1 CAPSULE BY MOUTH IN THE MORNING, 1 CAPSULE IN THE AFTERNOON, AND 2 CAPSULES AT BEDTIME.  . [DISCONTINUED] terazosin (HYTRIN) 2 MG capsule Take 1 capsule (2 mg total) by mouth at bedtime.  Marland Kitchen amLODipine  (NORVASC) 10 MG tablet Take 1 tablet (10 mg total) by mouth daily.  Marland Kitchen atorvastatin (LIPITOR) 40 MG tablet Take 1 tablet (40 mg total) by mouth daily.  Marland Kitchen gabapentin (NEURONTIN) 300 MG capsule Take 2 capsules (600 mg total) by mouth at bedtime.  . polyethylene glycol-electrolytes (TRILYTE) 420 g solution Take 4,000 mLs by mouth as directed. (Patient not taking: Reported on 03/01/2021)  . terazosin (HYTRIN) 2 MG capsule Take 1 capsule (2 mg total) by mouth at bedtime.   No facility-administered encounter medications on file as of 03/01/2021.    Follow-up: Return in about 4 months (around 07/01/2021) for HTN and CKD.   Lindell Spar, MD

## 2021-03-05 NOTE — Assessment & Plan Note (Signed)
Care established Previous chart reviewed History and medications reviewed with the patient 

## 2021-03-05 NOTE — Assessment & Plan Note (Signed)
Stable Check BMP Avoid nephrotoxic agents

## 2021-03-05 NOTE — Assessment & Plan Note (Signed)
On Terazosin 

## 2021-03-05 NOTE — Assessment & Plan Note (Signed)
Denies any dyspnea or wheezing Not on any inhalers

## 2021-03-05 NOTE — Assessment & Plan Note (Signed)
BP Readings from Last 1 Encounters:  03/01/21 140/80   Well-controlled with Amlodipine Counseled for compliance with the medications Advised DASH diet and moderate exercise/walking, at least 150 mins/week

## 2021-03-05 NOTE — Assessment & Plan Note (Signed)
On Gabapentin 

## 2021-03-05 NOTE — Assessment & Plan Note (Signed)
On statin.

## 2021-03-23 ENCOUNTER — Other Ambulatory Visit: Payer: Self-pay

## 2021-03-23 ENCOUNTER — Other Ambulatory Visit (HOSPITAL_COMMUNITY)
Admission: RE | Admit: 2021-03-23 | Discharge: 2021-03-23 | Disposition: A | Discharge status: Home / Self Care | Payer: Medicare Other | Source: Ambulatory Visit | Attending: Internal Medicine | Admitting: Internal Medicine

## 2021-03-23 DIAGNOSIS — Z20822 Contact with and (suspected) exposure to covid-19: Secondary | ICD-10-CM | POA: Diagnosis not present

## 2021-03-23 DIAGNOSIS — Z01812 Encounter for preprocedural laboratory examination: Secondary | ICD-10-CM | POA: Diagnosis not present

## 2021-03-24 LAB — SARS CORONAVIRUS 2 (TAT 6-24 HRS): SARS Coronavirus 2: NEGATIVE

## 2021-03-25 ENCOUNTER — Encounter (HOSPITAL_COMMUNITY): Payer: Self-pay | Admitting: Internal Medicine

## 2021-03-25 ENCOUNTER — Ambulatory Visit (HOSPITAL_COMMUNITY)
Admission: RE | Admit: 2021-03-25 | Discharge: 2021-03-25 | Disposition: A | Discharge status: Home / Self Care | Payer: Medicare Other | Attending: Internal Medicine | Admitting: Internal Medicine

## 2021-03-25 ENCOUNTER — Encounter (HOSPITAL_COMMUNITY)
Admission: RE | Disposition: A | Discharge status: Home / Self Care | Payer: Self-pay | Source: Home / Self Care | Attending: Internal Medicine

## 2021-03-25 ENCOUNTER — Encounter (INDEPENDENT_AMBULATORY_CARE_PROVIDER_SITE_OTHER): Payer: Self-pay | Admitting: *Deleted

## 2021-03-25 ENCOUNTER — Other Ambulatory Visit: Payer: Self-pay

## 2021-03-25 DIAGNOSIS — I10 Essential (primary) hypertension: Secondary | ICD-10-CM

## 2021-03-25 DIAGNOSIS — Z886 Allergy status to analgesic agent status: Secondary | ICD-10-CM | POA: Diagnosis not present

## 2021-03-25 DIAGNOSIS — Z79899 Other long term (current) drug therapy: Secondary | ICD-10-CM | POA: Insufficient documentation

## 2021-03-25 DIAGNOSIS — F17210 Nicotine dependence, cigarettes, uncomplicated: Secondary | ICD-10-CM | POA: Insufficient documentation

## 2021-03-25 DIAGNOSIS — Z841 Family history of disorders of kidney and ureter: Secondary | ICD-10-CM | POA: Diagnosis not present

## 2021-03-25 DIAGNOSIS — Z8249 Family history of ischemic heart disease and other diseases of the circulatory system: Secondary | ICD-10-CM | POA: Diagnosis not present

## 2021-03-25 DIAGNOSIS — Z86010 Personal history of colonic polyps: Secondary | ICD-10-CM | POA: Diagnosis not present

## 2021-03-25 DIAGNOSIS — Z1211 Encounter for screening for malignant neoplasm of colon: Secondary | ICD-10-CM | POA: Insufficient documentation

## 2021-03-25 DIAGNOSIS — Z823 Family history of stroke: Secondary | ICD-10-CM | POA: Insufficient documentation

## 2021-03-25 DIAGNOSIS — Z8501 Personal history of malignant neoplasm of esophagus: Secondary | ICD-10-CM | POA: Diagnosis not present

## 2021-03-25 DIAGNOSIS — Z09 Encounter for follow-up examination after completed treatment for conditions other than malignant neoplasm: Secondary | ICD-10-CM | POA: Diagnosis not present

## 2021-03-25 DIAGNOSIS — K5730 Diverticulosis of large intestine without perforation or abscess without bleeding: Secondary | ICD-10-CM | POA: Insufficient documentation

## 2021-03-25 DIAGNOSIS — Z800 Family history of malignant neoplasm of digestive organs: Secondary | ICD-10-CM | POA: Diagnosis not present

## 2021-03-25 DIAGNOSIS — K644 Residual hemorrhoidal skin tags: Secondary | ICD-10-CM | POA: Diagnosis not present

## 2021-03-25 DIAGNOSIS — K219 Gastro-esophageal reflux disease without esophagitis: Secondary | ICD-10-CM | POA: Insufficient documentation

## 2021-03-25 HISTORY — PX: COLONOSCOPY: SHX5424

## 2021-03-25 LAB — HM COLONOSCOPY

## 2021-03-25 SURGERY — COLONOSCOPY
Anesthesia: Moderate Sedation

## 2021-03-25 MED ORDER — MEPERIDINE HCL 50 MG/ML IJ SOLN
INTRAMUSCULAR | Status: AC
  Filled 2021-03-25: qty 1

## 2021-03-25 MED ORDER — MEPERIDINE HCL 50 MG/ML IJ SOLN
INTRAMUSCULAR | Status: DC | PRN
  Administered 2021-03-25 (×2): 25 mg via INTRAVENOUS

## 2021-03-25 MED ORDER — SODIUM CHLORIDE 0.9 % IV SOLN: INTRAVENOUS | Status: DC

## 2021-03-25 MED ORDER — MIDAZOLAM HCL 5 MG/5ML IJ SOLN
INTRAMUSCULAR | Status: AC
  Filled 2021-03-25: qty 10

## 2021-03-25 MED ORDER — MIDAZOLAM HCL 5 MG/5ML IJ SOLN
INTRAMUSCULAR | Status: DC | PRN
  Administered 2021-03-25: 2 mg via INTRAVENOUS
  Administered 2021-03-25: 1 mg via INTRAVENOUS

## 2021-03-25 MED ORDER — STERILE WATER FOR IRRIGATION IR SOLN
Status: DC | PRN
  Administered 2021-03-25: 200 mL

## 2021-03-25 NOTE — Op Note (Signed)
[facility] Patient Name: Eddie [name] Procedure Date: 03/25/2021 8:29 AM MRN: [id number] Date of Birth: June 02, 1946 Attending MD: Hildred Laser , MD CSN: [id number] Age: 75 Admit Type: Outpatient Procedure:                Colonoscopy Indications:              High risk colon cancer surveillance: Personal                            history of colonic polyps Providers:                Hildred Laser, MD, Crystal Page, Nelma Rothman,                            Technician Referring MD:             Lindell Spar, MD Medicines:                Meperidine 50 mg IV, Midazolam 3 mg IV Complications:            No immediate complications. Estimated Blood Loss:     Estimated blood loss: none. Procedure:                Pre-Anesthesia Assessment:                           - Prior to the procedure, a History and Physical                            was performed, and patient medications and                            allergies were reviewed. The patient's tolerance of                            previous anesthesia was also reviewed. The risks                            and benefits of the procedure and the sedation                            options and risks were discussed with the patient.                            All questions were answered, and informed consent                            was obtained. Prior Anticoagulants: The patient has                            taken no previous anticoagulant or antiplatelet                            agents. ASA Grade Assessment: II - A patient with  mild systemic disease. After reviewing the risks                            and benefits, the patient was deemed in                            satisfactory condition to undergo the procedure.                           After obtaining informed consent, the colonoscope                            was passed under direct vision. Throughout the                            procedure, the  patient's blood pressure, pulse, and                            oxygen saturations were monitored continuously. The                            [id number]-[id number] ([id number]) scope was introduced through                            the anus and advanced to the the cecum, identified                            by appendiceal orifice and ileocecal valve. The                            colonoscopy was performed without difficulty. The                            patient tolerated the procedure well. The quality                            of the bowel preparation was good. The ileocecal                            valve, appendiceal orifice, and rectum were                            photographed. Scope In: 8:49:51 AM Scope Out: 9:04:01 AM Scope Withdrawal Time: 0 hours 9 minutes 30 seconds  Total Procedure Duration: 0 hours 14 minutes 10 seconds  Findings:      The perianal and digital rectal examinations were normal.      Scattered diverticula were found in the sigmoid colon.      The exam was otherwise normal throughout the examined colon.      External hemorrhoids were found during retroflexion. The hemorrhoids       were small. Impression:               - Diverticulosis in the sigmoid colon.                           -  External hemorrhoids.                           - No specimens collected. Moderate Sedation:      Moderate (conscious) sedation was administered by the endoscopy nurse       and supervised by the endoscopist. The following parameters were       monitored: oxygen saturation, heart rate, blood pressure, CO2       capnography and response to care. Total physician intraservice time was       17 minutes. Recommendation:           - Patient has a contact number available for                            emergencies. The signs and symptoms of potential                            delayed complications were discussed with the                            patient. Return to normal activities  tomorrow.                            Written discharge instructions were provided to the                            patient.                           - High fiber diet today.                           - Continue present medications.                           - May consider another colonoscopy in 7 years for                            surveillance. Procedure Code(s):        --- Professional ---                           [phone], Colonoscopy, flexible; diagnostic, including                            collection of specimen(s) by brushing or washing,                            when performed (separate procedure)                           G0500, Moderate sedation services provided by the                            same physician or other qualified health care  professional performing a gastrointestinal                            endoscopic service that sedation supports,                            requiring the presence of an independent trained                            observer to assist in the monitoring of the                            patient's level of consciousness and physiological                            status; initial 15 minutes of intra-service time;                            patient age 79 years or older (additional time may                            be reported with [phone], as appropriate) Diagnosis Code(s):        --- Professional ---                           Z86.010, Personal history of colonic polyps                           K64.4, Residual hemorrhoidal skin tags                           K57.30, Diverticulosis of large intestine without                            perforation or abscess without bleeding CPT copyright 2019 American Medical Association. All rights reserved. The codes documented in this report are preliminary and upon coder review may  be revised to meet current compliance requirements. Hildred Laser, MD Hildred Laser,  MD 03/25/2021 9:12:48 AM This report has been signed electronically. Number of Addenda: 0

## 2021-03-25 NOTE — H&P (Signed)
Eddie [name] is an 75 y.o. male.   Chief Complaint: Eddie [name] is here for colonoscopy HPI: Eddie [name] is 75 year old African-American male who is here for surveillance colonoscopy.  His last exam was in January 2015 with removal of 8 mm pedunculated tubular adenoma.  He has no GI complaints.  His bowels are regular he denies abdominal pain melena or rectal bleeding.  His appetite is good and his weight is stable.  Family history is negative for CRC. Personal history significant for squamous cell carcinoma the esophagus for which she was treated over 8 years ago.  He remains in remission.  Past Medical History:  Diagnosis Date  . Chronic back pain   . Duodenal ulcer 09/2012  . GERD (gastroesophageal reflux disease)   . Helicobacter pylori (H. pylori) infection 09/2012  . Hypertension   . Severe malnutrition ([place]) 10/26/2012  . 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 Squamous cell carcinoma of esophagus ([place]) 09/25/2012    Past Surgical History:  Procedure Laterality Date  . COLONOSCOPY N/A 12/26/2013   Procedure: COLONOSCOPY;  Surgeon: Rogene Houston, MD;  Location: [facility];  Service: Endoscopy;  Laterality: N/A;  830  . ESOPHAGOGASTRODUODENOSCOPY  09/21/2012   Procedure: ESOPHAGOGASTRODUODENOSCOPY (EGD);  Surgeon: Rogene Houston, MD;  Location: [facility];  Service: Endoscopy;  Laterality: N/A;  345  . PEG PLACEMENT  09/21/2012   Procedure: PERCUTANEOUS ENDOSCOPIC GASTROSTOMY (PEG) PLACEMENT;  Surgeon: Rogene Houston, MD;  Location: [facility];  Service: Endoscopy;  Laterality: N/A;  . PEG TUBE PLACEMENT  09/21/2012  . PORTACATH PLACEMENT    . REMOVAL OF GASTROSTOMY TUBE     august 2014    Family History  Problem Relation Age of Onset  . Stroke Mother   . Hypertension Father   . Cancer Brother        Throat cancer  . Kidney disease Daughter    Social History:  reports that he has been smoking cigarettes. He has a 50.00 pack-year smoking history. He has never  used smokeless tobacco. He reports current alcohol use of about 6.0 standard drinks of alcohol per week. He reports that he does not use drugs.  Allergies:  Allergies  Allergen Reactions  . Motrin [Ibuprofen] Swelling    Medications Prior to Admission  Medication Sig Dispense Refill  . amLODipine (NORVASC) 10 MG tablet Take 1 tablet (10 mg total) by mouth daily. (Eddie [name] taking differently: Take 10 mg by mouth in the morning.) 90 tablet 1  . atorvastatin (LIPITOR) 40 MG tablet Take 1 tablet (40 mg total) by mouth daily. (Eddie [name] taking differently: Take 40 mg by mouth in the morning.) 90 tablet 1  . gabapentin (NEURONTIN) 300 MG capsule Take 2 capsules (600 mg total) by mouth at bedtime. 180 capsule 1  . polyethylene glycol-electrolytes (TRILYTE) 420 g solution Take 4,000 mLs by mouth as directed. 4000 mL 0  . terazosin (HYTRIN) 2 MG capsule Take 1 capsule (2 mg total) by mouth at bedtime. 90 capsule 1  . vitamin B-12 (CYANOCOBALAMIN) 500 MCG tablet Take 500 mcg by mouth in the morning.      Results for orders placed or performed during the hospital encounter of 03/23/21 (from the past 48 hour(s))  SARS CORONAVIRUS 2 (TAT 6-24 HRS) Nasopharyngeal Nasopharyngeal Swab     Status: None   Collection Time: 03/23/21  8:50 AM   Specimen: Nasopharyngeal Swab  Result Value Ref Range   SARS Coronavirus 2 NEGATIVE NEGATIVE  Comment: (NOTE) SARS-CoV-2 target nucleic acids are NOT DETECTED.  The SARS-CoV-2 RNA is generally detectable in upper and lower respiratory specimens during the acute phase of infection. Negative results do not preclude SARS-CoV-2 infection, do not rule out co-infections with other pathogens, and should not be used as the sole basis for treatment or other Eddie [name] management decisions. Negative results must be combined with clinical observations, Eddie [name] history, and epidemiological information. The expected result is Negative.  Fact Sheet for  Patients: SugarRoll.be  Fact Sheet for Healthcare Providers: https://www.woods-mathews.com/  This test is not yet approved or cleared by the [place] FDA and  has been authorized for detection and/or diagnosis of SARS-CoV-2 by FDA under an Emergency Use Authorization (EUA). This EUA will remain  in effect (meaning this test can be used) for the duration of the COVID-19 declaration under Se ction 564(b)(1) of the Act, 21 U.S.C. section 360bbb-3(b)(1), unless the authorization is terminated or revoked sooner.  Performed at [facility], [place] [address] [place], [place] [address]    No results found.  Review of Systems  Blood pressure 134/84, pulse 85, temperature 98.6 F (37 C), temperature source Oral, resp. rate 15, height 5\' 7"  (1.702 m), weight 64.9 kg, SpO2 98 %. Physical Exam HENT:     Mouth/Throat:     Mouth: Mucous membranes are moist.     Pharynx: Oropharynx is clear.  Eyes:     General: No scleral icterus.    Conjunctiva/sclera: Conjunctivae normal.  Cardiovascular:     Rate and Rhythm: Normal rate and regular rhythm.     Heart sounds: Normal heart sounds. No murmur heard.   Pulmonary:     Effort: Pulmonary effort is normal.     Breath sounds: Normal breath sounds.  Abdominal:     Comments: Abdomen is symmetrical.  He has hypopigmented rash in left upper quadrant(ports herpetic rash) he has small umbilical hernia and scarring in left upper quadrant site of gastrostomy tube.  Abdomen is soft and nontender with organomegaly or masses.  Musculoskeletal:        General: No swelling.     Cervical back: Neck supple.  Lymphadenopathy:     Cervical: No cervical adenopathy.  Skin:    General: Skin is warm and dry.  Neurological:     Mental Status: He is alert.      Assessment/Plan  History of colonic adenoma Surveillance colonoscopy  Hildred Laser, MD 03/25/2021, 8:38 AM

## 2021-03-25 NOTE — Discharge Instructions (Signed)
Resume usual medications as before. High-fiber diet. No driving for 24 hours. May consider another exam in 7 years   Diverticulosis  Diverticulosis is a condition that develops when small pouches (diverticula) form in the wall of the large intestine (colon). The colon is where water is absorbed and stool (feces) is formed. The pouches form when the inside layer of the colon pushes through weak spots in the outer layers of the colon. You may have a few pouches or many of them. The pouches usually do not cause problems unless they become inflamed or infected. When this happens, the condition is called diverticulitis. What are the causes? The cause of this condition is not known. What increases the risk? The following factors may make you more likely to develop this condition:  Being older than age 68. Your risk for this condition increases with age. Diverticulosis is rare among people younger than age 31. By age 59, many people have it.  Eating a low-fiber diet.  Having frequent constipation.  Being overweight.  Not getting enough exercise.  Smoking.  Taking over-the-counter pain medicines, like aspirin and ibuprofen.  Having a family history of diverticulosis. What are the signs or symptoms? In most people, there are no symptoms of this condition. If you do have symptoms, they may include:  Bloating.  Cramps in the abdomen.  Constipation or diarrhea.  Pain in the lower left side of the abdomen. How is this diagnosed? Because diverticulosis usually has no symptoms, it is most often diagnosed during an exam for other colon problems. The condition may be diagnosed by:  Using a flexible scope to examine the colon (colonoscopy).  Taking an X-ray of the colon after dye has been put into the colon (barium enema).  Having a CT scan. How is this treated? You may not need treatment for this condition. Your health care provider may recommend treatment to prevent problems. You may  need treatment if you have symptoms or if you previously had diverticulitis. Treatment may include:  Eating a high-fiber diet.  Taking a fiber supplement.  Taking a live bacteria supplement (probiotic).  Taking medicine to relax your colon.   Follow these instructions at home: Medicines  Take over-the-counter and prescription medicines only as told by your health care provider.  If told by your health care provider, take a fiber supplement or probiotic. Constipation prevention Your condition may cause constipation. To prevent or treat constipation, you may need to:  Drink enough fluid to keep your urine pale yellow.  Take over-the-counter or prescription medicines.  Eat foods that are high in fiber, such as beans, whole grains, and fresh fruits and vegetables.  Limit foods that are high in fat and processed sugars, such as fried or sweet foods.   General instructions  Try not to strain when you have a bowel movement.  Keep all follow-up visits as told by your health care provider. This is important. Contact a health care provider if you:  Have pain in your abdomen.  Have bloating.  Have cramps.  Have not had a bowel movement in 3 days. Get help right away if:  Your pain gets worse.  Your bloating becomes very bad.  You have a fever or chills, and your symptoms suddenly get worse.  You vomit.  You have bowel movements that are bloody or black.  You have bleeding from your rectum. Summary  Diverticulosis is a condition that develops when small pouches (diverticula) form in the wall of the large intestine (colon).  You may have a few pouches or many of them.  This condition is most often diagnosed during an exam for other colon problems.  Treatment may include increasing the fiber in your diet, taking supplements, or taking medicines. This information is not intended to replace advice given to you by your health care provider. Make sure you discuss any  questions you have with your health care provider. Document Revised: 06/20/2019 Document Reviewed: 06/20/2019 Elsevier Patient Education  2021 [place].  Hemorrhoids Hemorrhoids are swollen veins that may develop:  In the butt (rectum). These are called internal hemorrhoids.  Around the opening of the butt (anus). These are called external hemorrhoids. Hemorrhoids can cause pain, itching, or bleeding. Most of the time, they do not cause serious problems. They usually get better with diet changes, lifestyle changes, and other home treatments. What are the causes? This condition may be caused by:  Having trouble pooping (constipation).  Pushing hard (straining) to poop.  Watery poop (diarrhea).  Pregnancy.  Being very overweight (obese).  Sitting for long periods of time.  Heavy lifting or other activity that causes you to strain.  Anal sex.  Riding a bike for a long period of time. What are the signs or symptoms? Symptoms of this condition include:  Pain.  Itching or soreness in the butt.  Bleeding from the butt.  Leaking poop.  Swelling in the area.  One or more lumps around the opening of your butt. How is this diagnosed? A doctor can often diagnose this condition by looking at the affected area. The doctor may also:  Do an exam that involves feeling the area with a gloved hand (digital rectal exam).  Examine the area inside your butt using a small tube (anoscope).  Order blood tests. This may be done if you have lost a lot of blood.  Have you get a test that involves looking inside the colon using a flexible tube with a camera on the end (sigmoidoscopy or colonoscopy). How is this treated? This condition can usually be treated at home. Your doctor may tell you to change what you eat, make lifestyle changes, or try home treatments. If these do not help, procedures can be done to remove the hemorrhoids or make them smaller. These may involve:  Placing  rubber bands at the base of the hemorrhoids to cut off their blood supply.  Injecting medicine into the hemorrhoids to shrink them.  Shining a type of light energy onto the hemorrhoids to cause them to fall off.  Doing surgery to remove the hemorrhoids or cut off their blood supply. Follow these instructions at home: Eating and drinking  Eat foods that have a lot of fiber in them. These include whole grains, beans, nuts, fruits, and vegetables.  Ask your doctor about taking products that have added fiber (fibersupplements).  Reduce the amount of fat in your diet. You can do this by: ? Eating low-fat dairy products. ? Eating less red meat. ? Avoiding processed foods.  Drink enough fluid to keep your pee (urine) pale yellow.   Managing pain and swelling  Take a warm-water bath (sitz bath) for 20 minutes to ease pain. Do this 3-4 times a day. You may do this in a bathtub or using a portable sitz bath that fits over the toilet.  If told, put ice on the painful area. It may be helpful to use ice between your warm baths. ? Put ice in a plastic bag. ? Place a towel between your  skin and the bag. ? Leave the ice on for 20 minutes, 2-3 times a day.   General instructions  Take over-the-counter and prescription medicines only as told by your doctor. ? Medicated creams and medicines may be used as told.  Exercise often. Ask your doctor how much and what kind of exercise is best for you.  Go to the bathroom when you have the urge to poop. Do not wait.  Avoid pushing too hard when you poop.  Keep your butt dry and clean. Use wet toilet paper or moist towelettes after pooping.  Do not sit on the toilet for a long time.  Keep all follow-up visits as told by your doctor. This is important. Contact a doctor if you:  Have pain and swelling that do not get better with treatment or medicine.  Have trouble pooping.  Cannot poop.  Have pain or swelling outside the area of the  hemorrhoids. Get help right away if you have:  Bleeding that will not stop. Summary  Hemorrhoids are swollen veins in the butt or around the opening of the butt.  They can cause pain, itching, or bleeding.  Eat foods that have a lot of fiber in them. These include whole grains, beans, nuts, fruits, and vegetables.  Take a warm-water bath (sitz bath) for 20 minutes to ease pain. Do this 3-4 times a day. This information is not intended to replace advice given to you by your health care provider. Make sure you discuss any questions you have with your health care provider. Document Revised: 11/29/2018 Document Reviewed: 04/12/2018 Elsevier Patient Education  2021 [place].  Colonoscopy, Adult, Care After This sheet gives you information about how to care for yourself after your procedure. Your doctor may also give you more specific instructions. If you have problems or questions, call your doctor. What can I expect after the procedure? After the procedure, it is common to have:  A small amount of blood in your poop (stool) for 24 hours.  Some gas.  Mild cramping or bloating in your belly (abdomen). Follow these instructions at home: Eating and drinking  Drink enough fluid to keep your pee (urine) pale yellow.  Follow instructions from your doctor about what you cannot eat or drink.  Return to your normal diet as told by your doctor. Avoid heavy or fried foods that are hard to digest.   Activity  Rest as told by your doctor.  Do not sit for a long time without moving. Get up to take short walks every 1-2 hours. This is important. Ask for help if you feel weak or unsteady.  Return to your normal activities as told by your doctor. Ask your doctor what activities are safe for you. To help cramping and bloating:  Try walking around.  Put heat on your belly as told by your doctor. Use the heat source that your doctor recommends, such as a moist heat pack or a heating  pad. ? Put a towel between your skin and the heat source. ? Leave the heat on for 20-30 minutes. ? Remove the heat if your skin turns bright red. This is very important if you are unable to feel pain, heat, or cold. You may have a greater risk of getting burned.   General instructions  If you were given a medicine to help you relax (sedative) during your procedure, it can affect you for many hours. Do not drive or use machinery until your doctor says that it is safe.  For the first 24 hours after the procedure: ? Do not sign important documents. ? Do not drink alcohol. ? Do your daily activities more slowly than normal. ? Eat foods that are soft and easy to digest.  Take over-the-counter or prescription medicines only as told by your doctor.  Keep all follow-up visits as told by your doctor. This is important. Contact a doctor if:  You have blood in your poop 2-3 days after the procedure. Get help right away if:  You have more than a small amount of blood in your poop.  You see large clumps of tissue (blood clots) in your poop.  Your belly is swollen.  You feel like you may vomit (nauseous).  You vomit.  You have a fever.  You have belly pain that gets worse, and medicine does not help your pain. Summary  After the procedure, it is common to have a small amount of blood in your poop. You may also have mild cramping and bloating in your belly.  If you were given a medicine to help you relax (sedative) during your procedure, it can affect you for many hours. Do not drive or use machinery until your doctor says that it is safe.  Get help right away if you have a lot of blood in your poop, feel like you may vomit, have a fever, or have more belly pain. This information is not intended to replace advice given to you by your health care provider. Make sure you discuss any questions you have with your health care provider. Document Revised: 09/27/2019 Document Reviewed:  06/17/2019 Elsevier Patient Education  [place].

## 2021-03-30 ENCOUNTER — Encounter (HOSPITAL_COMMUNITY): Payer: Self-pay | Admitting: Internal Medicine

## 2021-05-14 ENCOUNTER — Other Ambulatory Visit: Payer: Self-pay | Admitting: *Deleted

## 2021-05-14 DIAGNOSIS — I10 Essential (primary) hypertension: Secondary | ICD-10-CM

## 2021-05-14 DIAGNOSIS — B0229 Other postherpetic nervous system involvement: Secondary | ICD-10-CM

## 2021-05-14 DIAGNOSIS — I6523 Occlusion and stenosis of bilateral carotid arteries: Secondary | ICD-10-CM

## 2021-05-14 DIAGNOSIS — N400 Enlarged prostate without lower urinary tract symptoms: Secondary | ICD-10-CM

## 2021-05-14 MED ORDER — ATORVASTATIN CALCIUM 40 MG PO TABS: 40.0000 mg | ORAL_TABLET | Freq: Every day | ORAL | 1 refills | Status: DC

## 2021-05-14 MED ORDER — GABAPENTIN 300 MG PO CAPS: 600.0000 mg | ORAL_CAPSULE | Freq: Every day | ORAL | 1 refills | Status: DC

## 2021-05-14 MED ORDER — TERAZOSIN HCL 2 MG PO CAPS: 2.0000 mg | ORAL_CAPSULE | Freq: Every day | ORAL | 1 refills | Status: DC

## 2021-05-14 MED ORDER — AMLODIPINE BESYLATE 10 MG PO TABS: 10.0000 mg | ORAL_TABLET | Freq: Every day | ORAL | 1 refills | Status: DC

## 2021-06-10 ENCOUNTER — Telehealth: Payer: Self-pay

## 2021-07-01 ENCOUNTER — Ambulatory Visit: Payer: Medicare Other | Admitting: Internal Medicine

## 2021-07-28 ENCOUNTER — Other Ambulatory Visit: Payer: Self-pay

## 2021-07-28 ENCOUNTER — Ambulatory Visit (INDEPENDENT_AMBULATORY_CARE_PROVIDER_SITE_OTHER): Payer: Medicare Other | Admitting: Internal Medicine

## 2021-07-28 ENCOUNTER — Encounter: Payer: Self-pay | Admitting: Internal Medicine

## 2021-07-28 VITALS — BP 136/81 | HR 87 | Ht 67.0 in | Wt 145.0 lb

## 2021-07-28 DIAGNOSIS — N400 Enlarged prostate without lower urinary tract symptoms: Secondary | ICD-10-CM | POA: Diagnosis not present

## 2021-07-28 DIAGNOSIS — N1831 Chronic kidney disease, stage 3a: Secondary | ICD-10-CM | POA: Diagnosis not present

## 2021-07-28 DIAGNOSIS — Z0000 Encounter for general adult medical examination without abnormal findings: Secondary | ICD-10-CM

## 2021-07-28 DIAGNOSIS — Z720 Tobacco use: Secondary | ICD-10-CM | POA: Diagnosis not present

## 2021-07-28 DIAGNOSIS — Z0001 Encounter for general adult medical examination with abnormal findings: Secondary | ICD-10-CM

## 2021-07-28 DIAGNOSIS — I10 Essential (primary) hypertension: Secondary | ICD-10-CM

## 2021-07-28 NOTE — Patient Instructions (Signed)
Please continue taking medications as prescribed.  Please continue to follow low salt diet and perform moderate exercise/walking at least 150 mins/week.  Please get fasting blood tests done before the next visit.

## 2021-07-28 NOTE — Progress Notes (Signed)
Established Patient Office Visit  Subjective:  Patient ID: Eddie [name], male    DOB: Apr 10, 1946  Age: 75 y.o. MRN: [id number]  CC:  Chief Complaint  Patient presents with   Hypertension    Follow up    HPI Eddie [name] is a 75 year old male with PMH of HTN, esophageal ca. S/p chemoradiation, COPD, postherpetic neuralgia, CKD and colonic polyps who presents for follow up of his chronic medical conditions.  BP is well-controlled. Takes medications regularly. Patient denies headache, dizziness, chest pain, dyspnea or palpitations.  He takes Terazosin for BPH.  He has CKD stage 3a. Denies any dysuria, hematuria, urinary resistance or hesitance. Does report nocturia.  Past Medical History:  Diagnosis Date   Chronic back pain    Duodenal ulcer 09/2012   GERD (gastroesophageal reflux disease)    Helicobacter pylori (H. pylori) infection 09/2012   Hypertension    Severe malnutrition ([place]) 10/26/2012   Squamous cell carcinoma Oct 2014   Esophogus   Squamous cell carcinoma of esophagus ([place]) 09/25/2012    Past Surgical History:  Procedure Laterality Date   COLONOSCOPY N/A 12/26/2013   Procedure: COLONOSCOPY;  Surgeon: Rogene Houston, MD;  Location: [facility];  Service: Endoscopy;  Laterality: N/A;  830   COLONOSCOPY N/A 03/25/2021   Procedure: COLONOSCOPY;  Surgeon: Rogene Houston, MD;  Location: [facility];  Service: Endoscopy;  Laterality: N/A;  AM   ESOPHAGOGASTRODUODENOSCOPY  09/21/2012   Procedure: ESOPHAGOGASTRODUODENOSCOPY (EGD);  Surgeon: Rogene Houston, MD;  Location: [facility];  Service: Endoscopy;  Laterality: N/A;  345   PEG PLACEMENT  09/21/2012   Procedure: PERCUTANEOUS ENDOSCOPIC GASTROSTOMY (PEG) PLACEMENT;  Surgeon: Rogene Houston, MD;  Location: [facility];  Service: Endoscopy;  Laterality: N/A;   PEG TUBE PLACEMENT  09/21/2012   PORTACATH PLACEMENT     REMOVAL OF GASTROSTOMY TUBE     august 2014    Family History  Problem Relation Age of  Onset   Stroke Mother    Hypertension Father    Cancer Brother        Throat cancer   Kidney disease Daughter     Social History   Socioeconomic History   Marital status: Married    Spouse name: Not on file   Number of children: Not on file   Years of education: Not on file   Highest education level: Not on file  Occupational History   Not on file  Tobacco Use   Smoking status: Every Day    Packs/day: 1.00    Years: 50.00    Pack years: 50.00    Types: Cigarettes   Smokeless tobacco: Never   Tobacco comments:    1/2 to 1 pack x50 yrs.  Substance and Sexual Activity   Alcohol use: Yes    Alcohol/week: 6.0 standard drinks    Types: 6 Cans of beer per week    Comment: Patient states he drinks a couple of beers almost daily in the afternoon,   Drug use: No   Sexual activity: Not on file  Other Topics Concern   Not on file  Social History Narrative   Not on file   Social Determinants of Health   Financial Resource Strain: Low Risk    Difficulty of Paying Living Expenses: Not very hard  Food Insecurity: Not on file  Transportation Needs: Not on file  Physical Activity: Not on file  Stress: Not on file  Social Connections: Not on file  Intimate Partner Violence: Not on file    Outpatient Medications Prior to Visit  Medication Sig Dispense Refill   amLODipine (NORVASC) 10 MG tablet Take 1 tablet (10 mg total) by mouth daily. 90 tablet 1   atorvastatin (LIPITOR) 40 MG tablet Take 1 tablet (40 mg total) by mouth daily. 90 tablet 1   gabapentin (NEURONTIN) 300 MG capsule Take 2 capsules (600 mg total) by mouth at bedtime. 180 capsule 1   terazosin (HYTRIN) 2 MG capsule Take 1 capsule (2 mg total) by mouth at bedtime. 90 capsule 1   vitamin B-12 (CYANOCOBALAMIN) 500 MCG tablet Take 500 mcg by mouth in the morning.     polyethylene glycol-electrolytes (TRILYTE) 420 g solution Take 4,000 mLs by mouth as directed. (Patient not taking: Reported on 07/28/2021) 4000 mL 0   No  facility-administered medications prior to visit.    Allergies  Allergen Reactions   Motrin [Ibuprofen] Swelling    ROS Review of Systems  Constitutional:  Negative for chills and fever.  HENT:  Negative for congestion and sore throat.   Eyes:  Negative for pain and discharge.  Respiratory:  Negative for cough and shortness of breath.   Cardiovascular:  Negative for chest pain and palpitations.  Gastrointestinal:  Negative for constipation, diarrhea, nausea and vomiting.  Endocrine: Negative for polydipsia and polyuria.  Genitourinary:  Negative for dysuria and hematuria.  Musculoskeletal:  Negative for neck pain and neck stiffness.  Skin:  Negative for rash.  Neurological:  Negative for dizziness, weakness, numbness and headaches.  Psychiatric/Behavioral:  Negative for agitation and behavioral problems.      Objective:    Physical Exam Vitals reviewed.  Constitutional:      General: He is not in acute distress.    Appearance: He is not diaphoretic.  HENT:     Head: Normocephalic and atraumatic.     Nose: Nose normal.     Mouth/Throat:     Mouth: Mucous membranes are moist.  Eyes:     General: No scleral icterus.    Extraocular Movements: Extraocular movements intact.  Cardiovascular:     Rate and Rhythm: Normal rate and regular rhythm.     Pulses: Normal pulses.     Heart sounds: Normal heart sounds. No murmur heard. Pulmonary:     Breath sounds: Normal breath sounds. No wheezing or rales.  Abdominal:     Palpations: Abdomen is soft.     Tenderness: There is no abdominal tenderness.  Musculoskeletal:     Cervical back: Neck supple. No tenderness.     Right lower leg: No edema.     Left lower leg: No edema.  Skin:    General: Skin is warm.     Findings: No rash.  Neurological:     General: No focal deficit present.     Mental Status: He is alert and oriented to person, place, and time.  Psychiatric:        Mood and Affect: Mood normal.        Behavior:  Behavior normal.    BP 136/81 (BP Location: Right Arm, Patient Position: Sitting, Cuff Size: Large)   Pulse 87   Ht _0  (1.702 m)   Wt 145 lb (65.8 kg)   SpO2 97%   BMI 22.71 kg/m  Wt Readings from Last 3 Encounters:  07/28/21 145 lb (65.8 kg)  03/25/21 143 lb (64.9 kg)  03/01/21 145 lb (65.8 kg)     Health Maintenance Due  Topic Date Due  INFLUENZA VACCINE  07/05/2021    There are no preventive care reminders to display for this patient.  Lab Results  Component Value Date   TSH 2.24 03/15/2016   Lab Results  Component Value Date   WBC 12.1 (H) 10/28/2020   HGB 15.0 10/28/2020   HCT 45.4 10/28/2020   MCV 96.6 10/28/2020   PLT 261 10/28/2020   Lab Results  Component Value Date   NA 139 03/01/2021   K 5.0 03/01/2021   CO2 19 (L) 03/01/2021   GLUCOSE 86 03/01/2021   BUN 18 03/01/2021   CREATININE 1.54 (H) 03/01/2021   BILITOT 0.9 10/20/2020   ALKPHOS 64 07/22/2016   AST 23 10/20/2020   ALT 17 10/20/2020   PROT 7.5 10/20/2020   ALBUMIN 4.5 07/22/2016   CALCIUM 9.7 03/01/2021   EGFR 48 (L) 03/01/2021   Lab Results  Component Value Date   CHOL 133 06/09/2020   Lab Results  Component Value Date   HDL 67 06/09/2020   Lab Results  Component Value Date   LDLCALC 47 06/09/2020   Lab Results  Component Value Date   TRIG 109 06/09/2020   Lab Results  Component Value Date   CHOLHDL 2.0 06/09/2020   Lab Results  Component Value Date   HGBA1C 4.7 04/02/2014      Assessment & Plan:   Problem List Items Addressed This Visit       Cardiovascular and Mediastinum   Essential hypertension, benign - Primary    BP Readings from Last 1 Encounters:  07/28/21 136/81  Well-controlled with Amlodipine Counseled for compliance with the medications Advised DASH diet and moderate exercise/walking, at least 150 mins/week      Relevant Orders   CBC with Differential/Platelet     Genitourinary   CKD (chronic kidney disease) stage 3, GFR 30-59 ml/min  (HCC)    Check CMP Avoid nephrotoxic agents Plan to start ACEi or ARB based on next CMP and UA      Relevant Orders   CMP14+EGFR   Urinalysis, Routine w reflex microscopic   Benign prostatic hyperplasia without lower urinary tract symptoms    On Terazosin      Relevant Orders   PSA     No orders of the defined types were placed in this encounter.   Follow-up: Return in about 3 months (around 10/28/2021) for Annual physical.    Lindell Spar, MD

## 2021-07-28 NOTE — Assessment & Plan Note (Signed)
Smokes about 1 pack/day ° °Asked about quitting: confirms that he currently smokes cigarettes °Advise to quit smoking: Educated about QUITTING to reduce the risk of cancer, cardio and cerebrovascular disease. °Assess willingness: Unwilling to quit at this time, but is working on cutting back. °Assist with counseling and pharmacotherapy: Counseled for 5 minutes and literature provided. °Arrange for follow up: Follow up in 3 months and continue to offer help. °

## 2021-07-28 NOTE — Assessment & Plan Note (Signed)
Check CMP Avoid nephrotoxic agents Plan to start ACEi or ARB based on next CMP and UA

## 2021-07-28 NOTE — Assessment & Plan Note (Signed)
On Terazosin 

## 2021-07-28 NOTE — Assessment & Plan Note (Signed)
BP Readings from Last 1 Encounters:  07/28/21 136/81   Well-controlled with Amlodipine Counseled for compliance with the medications Advised DASH diet and moderate exercise/walking, at least 150 mins/week

## 2021-10-13 ENCOUNTER — Other Ambulatory Visit: Payer: Self-pay | Admitting: Internal Medicine

## 2021-10-13 DIAGNOSIS — I10 Essential (primary) hypertension: Secondary | ICD-10-CM

## 2021-10-13 DIAGNOSIS — I6523 Occlusion and stenosis of bilateral carotid arteries: Secondary | ICD-10-CM

## 2021-10-13 DIAGNOSIS — N400 Enlarged prostate without lower urinary tract symptoms: Secondary | ICD-10-CM

## 2021-10-13 DIAGNOSIS — B0229 Other postherpetic nervous system involvement: Secondary | ICD-10-CM

## 2021-10-22 ENCOUNTER — Other Ambulatory Visit: Payer: Self-pay

## 2021-10-22 ENCOUNTER — Ambulatory Visit (INDEPENDENT_AMBULATORY_CARE_PROVIDER_SITE_OTHER): Payer: Medicare Other | Admitting: *Deleted

## 2021-10-22 DIAGNOSIS — Z0000 Encounter for general adult medical examination without abnormal findings: Secondary | ICD-10-CM

## 2021-10-22 DIAGNOSIS — I700 Atherosclerosis of aorta: Secondary | ICD-10-CM | POA: Diagnosis not present

## 2021-10-22 DIAGNOSIS — R7989 Other specified abnormal findings of blood chemistry: Secondary | ICD-10-CM | POA: Diagnosis not present

## 2021-10-22 DIAGNOSIS — Z122 Encounter for screening for malignant neoplasm of respiratory organs: Secondary | ICD-10-CM | POA: Diagnosis not present

## 2021-10-22 DIAGNOSIS — Z0100 Encounter for examination of eyes and vision without abnormal findings: Secondary | ICD-10-CM | POA: Diagnosis not present

## 2021-10-22 DIAGNOSIS — Z131 Encounter for screening for diabetes mellitus: Secondary | ICD-10-CM | POA: Diagnosis not present

## 2021-10-22 DIAGNOSIS — I779 Disorder of arteries and arterioles, unspecified: Secondary | ICD-10-CM | POA: Diagnosis not present

## 2021-10-22 DIAGNOSIS — I10 Essential (primary) hypertension: Secondary | ICD-10-CM | POA: Diagnosis not present

## 2021-10-22 DIAGNOSIS — E559 Vitamin D deficiency, unspecified: Secondary | ICD-10-CM | POA: Diagnosis not present

## 2021-10-22 DIAGNOSIS — N1831 Chronic kidney disease, stage 3a: Secondary | ICD-10-CM | POA: Diagnosis not present

## 2021-10-22 DIAGNOSIS — N183 Chronic kidney disease, stage 3 unspecified: Secondary | ICD-10-CM | POA: Diagnosis not present

## 2021-10-22 NOTE — Progress Notes (Signed)
Subjective:   Eddie [name] is a 75 y.o. male who presents for Medicare Annual/Subsequent preventive examination.  I connected with  Eddie [name] on 10/22/21 by a audio enabled telemedicine application and verified that I am speaking with the correct person using two identifiers.  Patient Location: Home  Provider Location: Home Office  I discussed the limitations of evaluation and management by telemedicine. The patient expressed understanding and agreed to proceed.   Review of Systems     Eddie [name] , Thank you for taking time to come for your Medicare Wellness Visit. I appreciate your ongoing commitment to your health goals. Please review the following plan we discussed and let me know if I can assist you in the future.   These are the goals we discussed:  Goals      Manage My Medicine     Timeframe:  Long-Range Goal Priority:  Medium Start Date:    02/03/21                         Expected End Date:  08/06/21                     Follow Up Date 07/03/21   - call for medicine refill 2 or 3 days before it runs out - keep a list of all the medicines I take; vitamins and herbals too - use a pillbox to sort medicine    Why is this important?   These steps will help you keep on track with your medicines.   Notes:         This is a list of the screening recommended for you and due dates:  Health Maintenance  Topic Date Due   COVID-19 Vaccine (3 - Moderna risk series) 02/22/2021   Flu Shot  07/05/2021   Tetanus Vaccine  07/28/2022*   Colon Cancer Screening  03/25/2028   Pneumonia Vaccine  Completed   Hepatitis C Screening: USPSTF Recommendation to screen - Ages 18-79 yo.  Completed   Zoster (Shingles) Vaccine  Completed   HPV Vaccine  Aged Out  *Topic was postponed. The date shown is not the original due date.    Cardiac Risk Factors include: advanced age (>1men, >37 women);hypertension;male gender;smoking/ tobacco exposure     Objective:    Today's Vitals    10/22/21 1051  PainSc: 0-No pain   There is no height or weight on file to calculate BMI.  Advanced Directives 10/22/2021 03/25/2021 10/20/2020 10/22/2019 10/01/2019 05/13/2019 11/18/2015  Does Patient Have a Medical Advance Directive? No No No No No No No  Would patient like information on creating a medical advance directive? Yes (MAU/Ambulatory/Procedural Areas - Information given) No - Patient declined Yes (MAU/Ambulatory/Procedural Areas - Information given) No - Patient declined - - -  Pre-existing out of facility DNR order (yellow form or pink MOST form) - - - - - - -    Current Medications (verified) Outpatient Encounter Medications as of 10/22/2021  Medication Sig   amLODipine (NORVASC) 10 MG tablet TAKE 1 TABLET BY MOUTH ONCE DAILY   atorvastatin (LIPITOR) 40 MG tablet TAKE 1 TABLET BY MOUTH ONCE DAILY   gabapentin (NEURONTIN) 300 MG capsule TAKE 2 CAPSULES BY MOUTH AT BEDTIME   terazosin (HYTRIN) 2 MG capsule TAKE 1 CAPSULE BY MOUTH AT  BEDTIME   vitamin B-12 (CYANOCOBALAMIN) 500 MCG tablet Take 500 mcg by mouth in the morning.   No facility-administered encounter medications  on file as of 10/22/2021.    Allergies (verified) Motrin [ibuprofen]   History: Past Medical History:  Diagnosis Date   Chronic back pain    Duodenal ulcer 09/2012   GERD (gastroesophageal reflux disease)    Helicobacter pylori (H. pylori) infection 09/2012   Hypertension    Severe malnutrition ([place]) 10/26/2012   Squamous cell carcinoma Oct 2014   Esophogus   Squamous cell carcinoma of esophagus ([place]) 09/25/2012   Past Surgical History:  Procedure Laterality Date   COLONOSCOPY N/A 12/26/2013   Procedure: COLONOSCOPY;  Surgeon: Rogene Houston, MD;  [facility];  Service: Endoscopy;  Laterality: N/A;  830   COLONOSCOPY N/A 03/25/2021   Procedure: COLONOSCOPY;  Surgeon: Rogene Houston, MD;  [facility];  Service: Endoscopy;  Laterality: N/A;  AM    ESOPHAGOGASTRODUODENOSCOPY  09/21/2012   Procedure: ESOPHAGOGASTRODUODENOSCOPY (EGD);  Surgeon: Rogene Houston, MD;  [facility]: [facility];  Service: Endoscopy;  Laterality: N/A;  345   PEG PLACEMENT  09/21/2012   Procedure: PERCUTANEOUS ENDOSCOPIC GASTROSTOMY (PEG) PLACEMENT;  Surgeon: Rogene Houston, MD;  Location: [facility];  Service: Endoscopy;  Laterality: N/A;   PEG TUBE PLACEMENT  09/21/2012   PORTACATH PLACEMENT     REMOVAL OF GASTROSTOMY TUBE     august 2014   Family History  Problem Relation Age of Onset   Stroke Mother    Hypertension Father    Cancer Brother        Throat cancer   Kidney disease Daughter    Social History   Socioeconomic History   Marital status: Widowed    Spouse name: Not on file   Number of children: 4   Years of education: 10   Highest education level: Not on file  Occupational History   Not on file  Tobacco Use   Smoking status: Every Day    Packs/day: 1.00    Years: 50.00    Pack years: 50.00    Types: Cigarettes   Smokeless tobacco: Never   Tobacco comments:    1/2 to 1 pack x50 yrs.  Substance and Sexual Activity   Alcohol use: Yes    Alcohol/week: 6.0 standard drinks    Types: 6 Cans of beer per week    Comment: Patient states he drinks a couple of beers almost daily in the afternoon,   Drug use: No   Sexual activity: Not on file  Other Topics Concern   Not on file  Social History Narrative   Patient lives with friend has 4 children   Social Determinants of Health   Financial Resource Strain: Low Risk    Difficulty of Paying Living Expenses: Not hard at all  Food Insecurity: No Food Insecurity   Worried About Charity fundraiser in the Last Year: Never true   Arboriculturist in the Last Year: Never true  Transportation Needs: No Transportation Needs   Lack of Transportation (Medical): No   Lack of Transportation (Non-Medical): No  Physical Activity: Sufficiently Active   Days of Exercise per Week: 7 days    Minutes of Exercise per Session: 50 min  Stress: No Stress Concern Present   Feeling of Stress : Not at all  Social Connections: Moderately Isolated   Frequency of Communication with Friends and Family: More than three times a week   Frequency of Social Gatherings with Friends and Family: More than three times a week   Attends Religious Services: 1 to  4 times per year   Active Member of Clubs or Organizations: No   Attends Archivist Meetings: Never   Marital Status: Widowed    Tobacco Counseling Ready to quit: Yes Counseling given: Yes Tobacco comments: 1/2 to 1 pack x50 yrs.   Clinical Intake:  Pre-visit preparation completed: Yes  Pain : No/denies pain Pain Score: 0-No pain     BMI - recorded: 22.71 Nutritional Status: BMI of 19-24  Normal Nutritional Risks: None Diabetes: No  How often do you need to have someone help you when you read instructions, pamphlets, or other written materials from your doctor or pharmacy?: 1 - Never What is the last grade level you completed in school?: 10th grade  Diabetic?no  Interpreter Needed?: No      Activities of Daily Living In your present state of health, do you have any difficulty performing the following activities: 10/22/2021  Hearing? N  Vision? N  Difficulty concentrating or making decisions? N  Walking or climbing stairs? N  Dressing or bathing? N  Doing errands, shopping? N  Preparing Food and eating ? N  Using the Toilet? N  In the past six months, have you accidently leaked urine? N  Do you have problems with loss of bowel control? N  Managing your Medications? N  Managing your Finances? N  Housekeeping or managing your Housekeeping? N  Some recent data might be hidden    Patient Care Team: Lindell Spar, MD as PCP - General (Internal Medicine) Gatha Mayer, MD as Consulting Physician (Radiation Oncology) Everardo All, MD as Consulting Physician (Hematology and Oncology) Rogene Houston, MD as Consulting Physician (Gastroenterology) Edythe Clarity, [facility] as Pharmacist (Pharmacist)  Indicate any recent Medical Services you may have received from other than [facility] providers in the past year (date may be approximate).     Assessment:   This is a routine wellness examination for [place].  Hearing/Vision screen No results found.  Dietary issues and exercise activities discussed: Current Exercise Habits: Home exercise routine, Type of exercise: walking, Time (Minutes): 60, Frequency (Times/Week): 7, Weekly Exercise (Minutes/Week): 420, Intensity: Mild, Exercise limited by: None identified   Goals Addressed             This Visit's Progress    Manage My Medicine   On track    Timeframe:  Long-Range Goal Priority:  Medium Start Date:    02/03/21                         Expected End Date:  08/06/21                     Follow Up Date 07/03/21   - call for medicine refill 2 or 3 days before it runs out - keep a list of all the medicines I take; vitamins and herbals too - use a pillbox to sort medicine    Why is this important?   These steps will help you keep on track with your medicines.   Notes:       Depression Screen PHQ 2/9 Scores 10/22/2021 10/22/2021 07/28/2021 03/01/2021 10/20/2020 06/09/2020 11/12/2019  PHQ - 2 Score 0 0 0 0 0 0 0  PHQ- 9 Score - - - - - - -    Fall Risk Fall Risk  10/22/2021 07/28/2021 03/01/2021 10/20/2020 06/09/2020  Falls in the past year? 0 0 0 0 0  Number falls in past yr: 0 0  0 - -  Injury with Fall? 0 0 0 - -  Risk for fall due to : No Fall Risks No Fall Risks No Fall Risks No Fall Risks No Fall Risks  Follow up Falls evaluation completed Falls evaluation completed Falls evaluation completed Falls evaluation completed Falls evaluation completed    [place]:  Any stairs in or around the home? No  If so, are there any without handrails? No  Home free of loose throw rugs in walkways, pet beds,  electrical cords, etc? Yes  Adequate lighting in your home to reduce risk of falls? Yes   ASSISTIVE DEVICES UTILIZED TO PREVENT FALLS:  Life alert? No  Use of a cane, walker or w/c? No  Grab bars in the bathroom? Yes  Shower chair or bench in shower? No  Elevated toilet seat or a handicapped toilet? No    Cognitive Function: MMSE - Mini Mental State Exam 10/22/2021  Not completed: Unable to complete     6CIT Screen 10/22/2021  What Year? 0 points  What month? 0 points  What time? 0 points  Count back from 20 0 points  Months in reverse 2 points  Repeat phrase 0 points  Total Score 2    Immunizations Immunization History  Administered Date(s) Administered   Fluad Quad(high Dose 65+) 10/20/2020   Influenza, High Dose Seasonal PF 09/19/2016, 09/26/2018, 09/11/2019   Influenza,inj,Quad PF,6+ Mos 11/12/2013, 09/09/2014, 11/18/2015, 09/07/2017   Influenza-Unspecified 08/12/2019   Moderna SARS-COV2 Booster Vaccination 01/25/2021   Moderna Sars-Covid-2 Vaccination 04/07/2020, 05/15/2020   Pneumococcal Conjugate-13 11/12/2013   Pneumococcal Polysaccharide-23 10/26/2012, 10/20/2020   Zoster Recombinat (Shingrix) 01/14/2020, 03/16/2020    TDAP status: Due, Education has been provided regarding the importance of this vaccine. Advised may receive this vaccine at local pharmacy or Health Dept. Aware to provide a copy of the vaccination record if obtained from local pharmacy or Health Dept. Verbalized acceptance and understanding.  Flu Vaccine status: Due, Education has been provided regarding the importance of this vaccine. Advised may receive this vaccine at local pharmacy or Health Dept. Aware to provide a copy of the vaccination record if obtained from local pharmacy or Health Dept. Verbalized acceptance and understanding.  Pneumococcal vaccine status: Up to date  Covid-19 vaccine status: Completed vaccines  Qualifies for Shingles Vaccine? Yes   Zostavax completed Yes    Shingrix Completed?: Yes  Screening Tests Health Maintenance  Topic Date Due   COVID-19 Vaccine (3 - Moderna risk series) 02/22/2021   INFLUENZA VACCINE  07/05/2021   TETANUS/TDAP  07/28/2022 (Originally 03/10/1965)   COLONOSCOPY (Pts 45-76yrs Insurance coverage will need to be confirmed)  03/25/2028   Pneumonia Vaccine 42+ Years old  Completed   Hepatitis C Screening  Completed   Zoster Vaccines- Shingrix  Completed   HPV VACCINES  Aged Out    Health Maintenance  Health Maintenance Due  Topic Date Due   COVID-19 Vaccine (3 - Moderna risk series) 02/22/2021   INFLUENZA VACCINE  07/05/2021    Colorectal cancer screening: Type of screening: Colonoscopy. Completed 03-25-21. Repeat every 7 years  Lung Cancer Screening: (Low Dose CT Chest recommended if Age 46-80 years, 30 pack-year currently smoking OR have quit w/in 15years.) does qualify.   Lung Cancer Screening Referral: Ordered  Additional Screening:  Hepatitis C Screening: does qualify; Completed 05-13-19  Vision Screening: Recommended annual ophthalmology exams for early detection of glaucoma and other disorders of the eye. Is the patient up to date with  their annual eye exam?  No  Who is the provider or what is the name of the office in which the patient attends annual eye exams? Doesn't have an eye doctor If pt is not established with a provider, would they like to be referred to a provider to establish care? Yes .   Dental Screening: Recommended annual dental exams for proper oral hygiene  Community Resource Referral / Chronic Care Management: CRR required this visit?  No   CCM required this visit?  No      Plan:     I have personally reviewed and noted the following in the patient's chart:   Medical and social history Use of alcohol, tobacco or illicit drugs  Current medications and supplements including opioid prescriptions. Patient is not currently taking opioid prescriptions. Functional ability and  status Nutritional status Physical activity Advanced directives List of other physicians Hospitalizations, surgeries, and ER visits in previous 12 months Vitals Screenings to include cognitive, depression, and falls Referrals and appointments  In addition, I have reviewed and discussed with patient certain preventive protocols, quality metrics, and best practice recommendations. A written personalized care plan for preventive services as well as general preventive health recommendations were provided to patient.     Shelda Altes, CMA   10/22/2021   Nurse Notes:  Mr. Regis , Thank you for taking time to come for your Medicare Wellness Visit. I appreciate your ongoing commitment to your health goals. Please review the following plan we discussed and let me know if I can assist you in the future.   These are the goals we discussed:  Goals      Manage My Medicine     Timeframe:  Long-Range Goal Priority:  Medium Start Date:    02/03/21                         Expected End Date:  08/06/21                     Follow Up Date 07/03/21   - call for medicine refill 2 or 3 days before it runs out - keep a list of all the medicines I take; vitamins and herbals too - use a pillbox to sort medicine    Why is this important?   These steps will help you keep on track with your medicines.   Notes:         This is a list of the screening recommended for you and due dates:  Health Maintenance  Topic Date Due   COVID-19 Vaccine (3 - Moderna risk series) 02/22/2021   Flu Shot  07/05/2021   Tetanus Vaccine  07/28/2022*   Colon Cancer Screening  03/25/2028   Pneumonia Vaccine  Completed   Hepatitis C Screening: USPSTF Recommendation to screen - Ages 18-79 yo.  Completed   Zoster (Shingles) Vaccine  Completed   HPV Vaccine  Aged Out  *Topic was postponed. The date shown is not the original due date.

## 2021-10-22 NOTE — Patient Instructions (Addendum)
Eddie [name] , Thank you for taking time to come for your Medicare Wellness Visit. I appreciate your ongoing commitment to your health goals. Please review the following plan we discussed and let me know if I can assist you in the future.   These are the goals we discussed:  Goals      Manage My Medicine     Timeframe:  Long-Range Goal Priority:  Medium Start Date:    02/03/21                         Expected End Date:  08/06/21                     Follow Up Date 07/03/21   - call for medicine refill 2 or 3 days before it runs out - keep a list of all the medicines I take; vitamins and herbals too - use a pillbox to sort medicine    Why is this important?   These steps will help you keep on track with your medicines.   Notes:         This is a list of the screening recommended for you and due dates:  Health Maintenance  Topic Date Due   COVID-19 Vaccine (3 - Moderna risk series) 02/22/2021   Flu Shot  07/05/2021   Tetanus Vaccine  07/28/2022*   Colon Cancer Screening  03/25/2028   Pneumonia Vaccine  Completed   Hepatitis C Screening: USPSTF Recommendation to screen - Ages 18-79 yo.  Completed   Zoster (Shingles) Vaccine  Completed   HPV Vaccine  Aged Out  *Topic was postponed. The date shown is not the original due date.   Eddie [name] , Thank you for taking time to come for your Medicare Wellness Visit. I appreciate your ongoing commitment to your health goals. Please review the following plan we discussed and let me know if I can assist you in the future.   Screening recommendations/referrals: Colonoscopy: Due 03-25-28 Recommended yearly ophthalmology/optometry visit for glaucoma screening and checkup Recommended yearly dental visit for hygiene and checkup  Vaccinations: Influenza vaccine: Due now Pneumococcal vaccine: Completed Tdap vaccine: Due now Shingles vaccine: Completed    Advanced directives: Information provided  Conditions/risks identified:  Hypertension  Next appointment: 1 year  Preventive Care 73 Years and Older, Male Preventive care refers to lifestyle choices and visits with your health care provider that can promote health and wellness. What does preventive care include? A yearly physical exam. This is also called an annual well check. Dental exams once or twice a year. Routine eye exams. Ask your health care provider how often you should have your eyes checked. Personal lifestyle choices, including: Daily care of your teeth and gums. Regular physical activity. Eating a healthy diet. Avoiding tobacco and drug use. Limiting alcohol use. Practicing safe sex. Taking low doses of aspirin every day. Taking vitamin and mineral supplements as recommended by your health care provider. What happens during an annual well check? The services and screenings done by your health care provider during your annual well check will depend on your age, overall health, lifestyle risk factors, and family history of disease. Counseling  Your health care provider may ask you questions about your: Alcohol use. Tobacco use. Drug use. Emotional well-being. Home and relationship well-being. Sexual activity. Eating habits. History of falls. Memory and ability to understand (cognition). Work and work Statistician. Screening  You may have the following tests or measurements:  Height, weight, and BMI. Blood pressure. Lipid and cholesterol levels. These may be checked every 5 years, or more frequently if you are over 26 years old. Skin check. Lung cancer screening. You may have this screening every year starting at age 73 if you have a 30-pack-year history of smoking and currently smoke or have quit within the past 15 years. Fecal occult blood test (FOBT) of the stool. You may have this test every year starting at age 73. Flexible sigmoidoscopy or colonoscopy. You may have a sigmoidoscopy every 5 years or a colonoscopy every 10 years  starting at age 63. Prostate cancer screening. Recommendations will vary depending on your family history and other risks. Hepatitis C blood test. Hepatitis B blood test. Sexually transmitted disease (STD) testing. Diabetes screening. This is done by checking your blood sugar (glucose) after you have not eaten for a while (fasting). You may have this done every 1-3 years. Abdominal aortic aneurysm (AAA) screening. You may need this if you are a current or former smoker. Osteoporosis. You may be screened starting at age 37 if you are at high risk. Talk with your health care provider about your test results, treatment options, and if necessary, the need for more tests. Vaccines  Your health care provider may recommend certain vaccines, such as: Influenza vaccine. This is recommended every year. Tetanus, diphtheria, and acellular pertussis (Tdap, Td) vaccine. You may need a Td booster every 10 years. Zoster vaccine. You may need this after age 73. Pneumococcal 13-valent conjugate (PCV13) vaccine. One dose is recommended after age 73. Pneumococcal polysaccharide (PPSV23) vaccine. One dose is recommended after age 73. Talk to your health care provider about which screenings and vaccines you need and how often you need them. This information is not intended to replace advice given to you by your health care provider. Make sure you discuss any questions you have with your health care provider. Document Released: 12/18/2015 Document Revised: 08/10/2016 Document Reviewed: 09/22/2015 Elsevier Interactive Patient Education  2017 [place] Prevention in the Home Falls can cause injuries. They can happen to people of all ages. There are many things you can do to make your home safe and to help prevent falls. What can I do on the outside of my home? Regularly fix the edges of walkways and driveways and fix any cracks. Remove anything that might make you trip as you walk through a door, such as  a raised step or threshold. Trim any bushes or trees on the path to your home. Use bright outdoor lighting. Clear any walking paths of anything that might make someone trip, such as rocks or tools. Regularly check to see if handrails are loose or broken. Make sure that both sides of any steps have handrails. Any raised decks and porches should have guardrails on the edges. Have any leaves, snow, or ice cleared regularly. Use sand or salt on walking paths during winter. Clean up any spills in your garage right away. This includes oil or grease spills. What can I do in the bathroom? Use night lights. Install grab bars by the toilet and in the tub and shower. Do not use towel bars as grab bars. Use non-skid mats or decals in the tub or shower. If you need to sit down in the shower, use a plastic, non-slip stool. Keep the floor dry. Clean up any water that spills on the floor as soon as it happens. Remove soap buildup in the tub or shower regularly. Attach bath mats  securely with double-sided non-slip rug tape. Do not have throw rugs and other things on the floor that can make you trip. What can I do in the bedroom? Use night lights. Make sure that you have a light by your bed that is easy to reach. Do not use any sheets or blankets that are too big for your bed. They should not hang down onto the floor. Have a firm chair that has side arms. You can use this for support while you get dressed. Do not have throw rugs and other things on the floor that can make you trip. What can I do in the kitchen? Clean up any spills right away. Avoid walking on wet floors. Keep items that you use a lot in easy-to-reach places. If you need to reach something above you, use a strong step stool that has a grab bar. Keep electrical cords out of the way. Do not use floor polish or wax that makes floors slippery. If you must use wax, use non-skid floor wax. Do not have throw rugs and other things on the floor  that can make you trip. What can I do with my stairs? Do not leave any items on the stairs. Make sure that there are handrails on both sides of the stairs and use them. Fix handrails that are broken or loose. Make sure that handrails are as long as the stairways. Check any carpeting to make sure that it is firmly attached to the stairs. Fix any carpet that is loose or worn. Avoid having throw rugs at the top or bottom of the stairs. If you do have throw rugs, attach them to the floor with carpet tape. Make sure that you have a light switch at the top of the stairs and the bottom of the stairs. If you do not have them, ask someone to add them for you. What else can I do to help prevent falls? Wear shoes that: Do not have high heels. Have rubber bottoms. Are comfortable and fit you well. Are closed at the toe. Do not wear sandals. If you use a stepladder: Make sure that it is fully opened. Do not climb a closed stepladder. Make sure that both sides of the stepladder are locked into place. Ask someone to hold it for you, if possible. Clearly mark and make sure that you can see: Any grab bars or handrails. First and last steps. Where the edge of each step is. Use tools that help you move around (mobility aids) if they are needed. These include: Canes. Walkers. Scooters. Crutches. Turn on the lights when you go into a dark area. Replace any light bulbs as soon as they burn out. Set up your furniture so you have a clear path. Avoid moving your furniture around. If any of your floors are uneven, fix them. If there are any pets around you, be aware of where they are. Review your medicines with your doctor. Some medicines can make you feel dizzy. This can increase your chance of falling. Ask your doctor what other things that you can do to help prevent falls. This information is not intended to replace advice given to you by your health care provider. Make sure you discuss any questions you  have with your health care provider. Document Released: 09/17/2009 Document Revised: 04/28/2016 Document Reviewed: 12/26/2014 Elsevier Interactive Patient Education  2017 [employer].

## 2021-10-23 LAB — LIPID PANEL
Chol/HDL Ratio: 2.3 ratio (ref 0.0–5.0)
Cholesterol, Total: 173 mg/dL (ref 100–199)
HDL: 76 mg/dL (ref >39)
LDL Chol Calc (NIH): 81 mg/dL (ref 0–99)
Triglycerides: 89 mg/dL (ref 0–149)
VLDL Cholesterol Cal: 16 mg/dL (ref 5–40)

## 2021-10-23 LAB — URINALYSIS, ROUTINE W REFLEX MICROSCOPIC
Bilirubin, UA: NEGATIVE
Glucose, UA: NEGATIVE
Ketones, UA: NEGATIVE
Nitrite, UA: NEGATIVE
RBC, UA: NEGATIVE
Specific Gravity, UA: 1.007 (ref 1.005–1.030)
Urobilinogen, Ur: 0.2 mg/dL (ref 0.2–1.0)
pH, UA: 6 (ref 5.0–7.5)

## 2021-10-23 LAB — MICROSCOPIC EXAMINATION
Bacteria, UA: NONE SEEN
Casts: NONE SEEN /lpf
Epithelial Cells (non renal): NONE SEEN /hpf (ref 0–10)
RBC, Urine: NONE SEEN /hpf (ref 0–2)

## 2021-10-23 LAB — CBC WITH DIFFERENTIAL/PLATELET
Basophils Absolute: 0.1 10*3/uL (ref 0.0–0.2)
Basos: 1 %
EOS (ABSOLUTE): 0.4 10*3/uL (ref 0.0–0.4)
Eos: 4 %
Hematocrit: 48.8 % (ref 37.5–51.0)
Hemoglobin: 16.5 g/dL (ref 13.0–17.7)
Immature Grans (Abs): 0.1 10*3/uL (ref 0.0–0.1)
Immature Granulocytes: 1 %
Lymphocytes Absolute: 2.6 10*3/uL (ref 0.7–3.1)
Lymphs: 22 %
MCH: 32.7 pg (ref 26.6–33.0)
MCHC: 33.8 g/dL (ref 31.5–35.7)
MCV: 97 fL (ref 79–97)
Monocytes Absolute: 1.1 10*3/uL — ABNORMAL HIGH (ref 0.1–0.9)
Monocytes: 9 %
Neutrophils Absolute: 7.4 10*3/uL — ABNORMAL HIGH (ref 1.4–7.0)
Neutrophils: 63 %
Platelets: 239 10*3/uL (ref 150–450)
RBC: 5.05 x10E6/uL (ref 4.14–5.80)
RDW: 10.8 % — ABNORMAL LOW (ref 11.6–15.4)
WBC: 11.7 10*3/uL — ABNORMAL HIGH (ref 3.4–10.8)

## 2021-10-23 LAB — HEMOGLOBIN A1C
Est. average glucose Bld gHb Est-mCnc: 80 mg/dL
Hgb A1c MFr Bld: 4.4 % — ABNORMAL LOW (ref 4.8–5.6)

## 2021-10-23 LAB — CMP14+EGFR
ALT: 12 IU/L (ref 0–44)
AST: 22 IU/L (ref 0–40)
Albumin/Globulin Ratio: 2.2 (ref 1.2–2.2)
Albumin: 4.9 g/dL — ABNORMAL HIGH (ref 3.7–4.7)
Alkaline Phosphatase: 96 IU/L (ref 44–121)
BUN/Creatinine Ratio: 10 (ref 10–24)
BUN: 14 mg/dL (ref 8–27)
Bilirubin Total: 1 mg/dL (ref 0.0–1.2)
CO2: 19 mmol/L — ABNORMAL LOW (ref 20–29)
Calcium: 9.6 mg/dL (ref 8.6–10.2)
Chloride: 99 mmol/L (ref 96–106)
Creatinine, Ser: 1.36 mg/dL — ABNORMAL HIGH (ref 0.76–1.27)
Globulin, Total: 2.2 g/dL (ref 1.5–4.5)
Glucose: 92 mg/dL (ref 70–99)
Potassium: 4.9 mmol/L (ref 3.5–5.2)
Sodium: 136 mmol/L (ref 134–144)
Total Protein: 7.1 g/dL (ref 6.0–8.5)
eGFR: 55 mL/min/{1.73_m2} — ABNORMAL LOW (ref >59)

## 2021-10-23 LAB — PSA: Prostate Specific Ag, Serum: 2.9 ng/mL (ref 0.0–4.0)

## 2021-10-23 LAB — VITAMIN D 25 HYDROXY (VIT D DEFICIENCY, FRACTURES): Vit D, 25-Hydroxy: 29.6 ng/mL — ABNORMAL LOW (ref 30.0–100.0)

## 2021-10-23 LAB — TSH: TSH: 3.63 u[IU]/mL (ref 0.450–4.500)

## 2021-10-26 ENCOUNTER — Encounter: Payer: Self-pay | Admitting: Internal Medicine

## 2021-10-26 ENCOUNTER — Other Ambulatory Visit: Payer: Self-pay

## 2021-10-26 ENCOUNTER — Ambulatory Visit (INDEPENDENT_AMBULATORY_CARE_PROVIDER_SITE_OTHER): Payer: Medicare Other | Admitting: Internal Medicine

## 2021-10-26 VITALS — BP 128/70 | HR 90 | Resp 18 | Ht 67.0 in | Wt 148.1 lb

## 2021-10-26 DIAGNOSIS — Z720 Tobacco use: Secondary | ICD-10-CM | POA: Diagnosis not present

## 2021-10-26 DIAGNOSIS — Z23 Encounter for immunization: Secondary | ICD-10-CM | POA: Diagnosis not present

## 2021-10-26 DIAGNOSIS — J309 Allergic rhinitis, unspecified: Secondary | ICD-10-CM

## 2021-10-26 DIAGNOSIS — E559 Vitamin D deficiency, unspecified: Secondary | ICD-10-CM | POA: Diagnosis not present

## 2021-10-26 DIAGNOSIS — I10 Essential (primary) hypertension: Secondary | ICD-10-CM | POA: Diagnosis not present

## 2021-10-26 DIAGNOSIS — Z0001 Encounter for general adult medical examination with abnormal findings: Secondary | ICD-10-CM

## 2021-10-26 MED ORDER — FLUTICASONE PROPIONATE 50 MCG/ACT NA SUSP: 2.0000 | Freq: Every day | NASAL | 6 refills | Status: DC

## 2021-10-26 NOTE — Assessment & Plan Note (Signed)
Last vitamin D Lab Results  Component Value Date   VD25OH 29.6 (L) 10/22/2021   Advised to take vitamin D 2000 IU daily 

## 2021-10-26 NOTE — Progress Notes (Signed)
Established Patient Office Visit  Subjective:  Patient ID: Eddie [name], male    DOB: March 15, 1946  Age: 75 y.o. MRN: [id number]  CC:  Chief Complaint  Patient presents with   Annual Exam    Annual exam     HPI Eddie [name] is a 75 y.o. male with past medical history of HTN, esophageal ca. S/p chemoradiation, COPD, postherpetic neuralgia, CKD and colonic polyps who presents for annual physical.  He has been doing well overall.  BP is well-controlled. Takes medications regularly. Patient denies headache, dizziness, chest pain, dyspnea or palpitations.  Blood tests were reviewed and discussed with the patient. He has CKD stage 3a.  His GFR has improved slightly compared to prior.  Denies any dysuria, hematuria, urinary resistance or hesitance. Does report nocturia.  He has cut down smoking to half pack per day now.  He complains of mild pain around the nostrils and runny nose for about a week now.  Denies any fever, chills, sore throat, nausea or vomiting currently.  He states that his symptoms started after he went out in rain.  Denies any recent sick contacts.  He received flu vaccine in the office today.  Past Medical History:  Diagnosis Date   Chronic back pain    Duodenal ulcer 09/2012   GERD (gastroesophageal reflux disease)    Helicobacter pylori (H. pylori) infection 09/2012   Hypertension    Severe malnutrition ([place]) 10/26/2012   Squamous cell carcinoma Oct 2014   Esophogus   Squamous cell carcinoma of esophagus ([place]) 09/25/2012    Past Surgical History:  Procedure Laterality Date   COLONOSCOPY N/A 12/26/2013   Procedure: COLONOSCOPY;  Surgeon: Rogene Houston, MD;  [facility];  Service: Endoscopy;  Laterality: N/A;  830   COLONOSCOPY N/A 03/25/2021   Procedure: COLONOSCOPY;  Surgeon: Rogene Houston, MD;  [facility];  Service: Endoscopy;  Laterality: N/A;  AM   ESOPHAGOGASTRODUODENOSCOPY  09/21/2012   Procedure: ESOPHAGOGASTRODUODENOSCOPY  (EGD);  Surgeon: Rogene Houston, MD;  [facility]: [facility];  Service: Endoscopy;  Laterality: N/A;  345   PEG PLACEMENT  09/21/2012   Procedure: PERCUTANEOUS ENDOSCOPIC GASTROSTOMY (PEG) PLACEMENT;  Surgeon: Rogene Houston, MD;  [facility];  Service: Endoscopy;  Laterality: N/A;   PEG TUBE PLACEMENT  09/21/2012   PORTACATH PLACEMENT     REMOVAL OF GASTROSTOMY TUBE     august 2014    Family History  Problem Relation Age of Onset   Stroke Mother    Hypertension Father    Cancer Brother        Throat cancer   Kidney disease Daughter     Social History   Socioeconomic History   Marital status: Widowed    Spouse name: Not on file   Number of children: 4   Years of education: 10   Highest education level: Not on file  Occupational History   Not on file  Tobacco Use   Smoking status: Every Day    Packs/day: 1.00    Years: 50.00    Pack years: 50.00    Types: Cigarettes   Smokeless tobacco: Never   Tobacco comments:    1/2 to 1 pack x50 yrs.  Substance and Sexual Activity   Alcohol use: Yes    Alcohol/week: 6.0 standard drinks    Types: 6 Cans of beer per week    Comment: Patient states he drinks a couple of beers almost daily in the  afternoon,   Drug use: No   Sexual activity: Not on file  Other Topics Concern   Not on file  Social History Narrative   Patient lives with friend has 4 children   Social Determinants of Health   Financial Resource Strain: Low Risk    Difficulty of Paying Living Expenses: Not hard at all  Food Insecurity: No Food Insecurity   Worried About Charity fundraiser in the Last Year: Never true   Arboriculturist in the Last Year: Never true  Transportation Needs: No Transportation Needs   Lack of Transportation (Medical): No   Lack of Transportation (Non-Medical): No  Physical Activity: Sufficiently Active   Days of Exercise per Week: 7 days   Minutes of Exercise per Session: 50 min  Stress: No Stress Concern Present    Feeling of Stress : Not at all  Social Connections: Moderately Isolated   Frequency of Communication with Friends and Family: More than three times a week   Frequency of Social Gatherings with Friends and Family: More than three times a week   Attends Religious Services: 1 to 4 times per year   Active Member of [employer] or Organizations: No   Attends Archivist Meetings: Never   Marital Status: Widowed  Human resources officer Violence: Not At Risk   Fear of Current or Ex-Partner: No   Emotionally Abused: No   Physically Abused: No   Sexually Abused: No    Outpatient Medications Prior to Visit  Medication Sig Dispense Refill   amLODipine (NORVASC) 10 MG tablet TAKE 1 TABLET BY MOUTH ONCE DAILY 90 tablet 3   atorvastatin (LIPITOR) 40 MG tablet TAKE 1 TABLET BY MOUTH ONCE DAILY 90 tablet 3   gabapentin (NEURONTIN) 300 MG capsule TAKE 2 CAPSULES BY MOUTH AT BEDTIME 180 capsule 3   terazosin (HYTRIN) 2 MG capsule TAKE 1 CAPSULE BY MOUTH AT  BEDTIME 90 capsule 3   vitamin B-12 (CYANOCOBALAMIN) 500 MCG tablet Take 500 mcg by mouth in the morning.     No facility-administered medications prior to visit.    Allergies  Allergen Reactions   Motrin [Ibuprofen] Swelling    ROS Review of Systems  Constitutional:  Negative for chills and fever.  HENT:  Negative for congestion and sore throat.   Eyes:  Negative for pain and discharge.  Respiratory:  Negative for cough and shortness of breath.   Cardiovascular:  Negative for chest pain and palpitations.  Gastrointestinal:  Negative for diarrhea, nausea and vomiting.  Endocrine: Negative for polydipsia and polyuria.  Genitourinary:  Negative for dysuria and hematuria.  Musculoskeletal:  Negative for neck pain and neck stiffness.  Skin:  Negative for rash.  Neurological:  Negative for dizziness, weakness, numbness and headaches.  Psychiatric/Behavioral:  Negative for agitation and behavioral problems.      Objective:    Physical  Exam Vitals reviewed.  Constitutional:      General: He is not in acute distress.    Appearance: He is not diaphoretic.  HENT:     Head: Normocephalic and atraumatic.     Nose: Nose normal.     Mouth/Throat:     Mouth: Mucous membranes are moist.  Eyes:     General: No scleral icterus.    Extraocular Movements: Extraocular movements intact.  Cardiovascular:     Rate and Rhythm: Normal rate and regular rhythm.     Pulses: Normal pulses.     Heart sounds: Normal heart sounds. No murmur heard.  Pulmonary:     Breath sounds: Normal breath sounds. No wheezing or rales.  Abdominal:     Palpations: Abdomen is soft.     Tenderness: There is no abdominal tenderness.  Musculoskeletal:     Cervical back: Neck supple. No tenderness.     Right lower leg: No edema.     Left lower leg: No edema.  Skin:    General: Skin is warm.     Findings: No rash.  Neurological:     General: No focal deficit present.     Mental Status: He is alert and oriented to person, place, and time.     Cranial Nerves: No cranial nerve deficit.     Sensory: No sensory deficit.     Motor: No weakness.  Psychiatric:        Mood and Affect: Mood normal.        Behavior: Behavior normal.    BP 128/70 (BP Location: Left Arm, Cuff Size: Normal)   Pulse 90   Resp 18   Ht 5' 7"  (1.702 m)   Wt 148 lb 1.9 oz (67.2 kg)   SpO2 98%   BMI 23.20 kg/m  Wt Readings from Last 3 Encounters:  10/26/21 148 lb 1.9 oz (67.2 kg)  07/28/21 145 lb (65.8 kg)  03/25/21 143 lb (64.9 kg)    Lab Results  Component Value Date   TSH 3.630 10/22/2021   Lab Results  Component Value Date   WBC 11.7 (H) 10/22/2021   HGB 16.5 10/22/2021   HCT 48.8 10/22/2021   MCV 97 10/22/2021   PLT 239 10/22/2021   Lab Results  Component Value Date   NA 136 10/22/2021   K 4.9 10/22/2021   CO2 19 (L) 10/22/2021   GLUCOSE 92 10/22/2021   BUN 14 10/22/2021   CREATININE 1.36 (H) 10/22/2021   BILITOT 1.0 10/22/2021   ALKPHOS 96 10/22/2021    AST 22 10/22/2021   ALT 12 10/22/2021   PROT 7.1 10/22/2021   ALBUMIN 4.9 (H) 10/22/2021   CALCIUM 9.6 10/22/2021   EGFR 55 (L) 10/22/2021   Lab Results  Component Value Date   CHOL 173 10/22/2021   Lab Results  Component Value Date   HDL 76 10/22/2021   Lab Results  Component Value Date   LDLCALC 81 10/22/2021   Lab Results  Component Value Date   TRIG 89 10/22/2021   Lab Results  Component Value Date   CHOLHDL 2.3 10/22/2021   Lab Results  Component Value Date   HGBA1C 4.4 (L) 10/22/2021      Assessment & Plan:   Problem List Items Addressed This Visit       Encounter for general adult medical examination with abnormal findings - Primary   Physical exam as documented. Counseling done  re healthy lifestyle involving commitment to 150 minutes exercise per week, heart healthy diet, and attaining healthy weight.The importance of adequate sleep also discussed. Changes in health habits are decided on by the patient with goals and time frames  set for achieving them. Immunization and cancer screening needs are specifically addressed at this visit.       Cardiovascular and Mediastinum   Essential hypertension, benign    BP Readings from Last 1 Encounters:  10/26/21 128/70  Well-controlled with Amlodipine Counseled for compliance with the medications Advised DASH diet and moderate exercise/walking, at least 150 mins/week        Other   Tobacco use    Smokes about 0.5 pack/day  now, cut down from 1 pack/day  Asked about quitting: confirms that he currently smokes cigarettes Advise to quit smoking: Educated about QUITTING to reduce the risk of cancer, cardio and cerebrovascular disease. Assess willingness: Unwilling to quit at this time, but is working on cutting back. Assist with counseling and pharmacotherapy: Counseled for 5 minutes and literature provided. Arrange for follow up: Follow up in 3 months and continue to offer help.            Vitamin D  deficiency    Last vitamin D Lab Results  Component Value Date   VD25OH 29.6 (L) 10/22/2021  Advised to take vitamin D 2000 IU daily      Other Visit Diagnoses     Allergic sinusitis       Relevant Medications   fluticasone (FLONASE) 50 MCG/ACT nasal spray       Meds ordered this encounter  Medications   fluticasone (FLONASE) 50 MCG/ACT nasal spray    Sig: Place 2 sprays into both nostrils daily.    Dispense:  16 g    Refill:  6    Follow-up: Return in about 6 months (around 04/25/2022) for HTN and HLD.    Lindell Spar, MD

## 2021-10-26 NOTE — Patient Instructions (Signed)
Please continue taking medications as prescribed.  Please start taking Vitamin D 2000 IU once daily.  Please use Flonase for nasal congestion. Okay to use vaporizer for nasal congestion.  Please continue to follow low salt diet and ambulate as tolerated.

## 2021-10-26 NOTE — Assessment & Plan Note (Signed)
BP Readings from Last 1 Encounters:  10/26/21 128/70   Well-controlled with Amlodipine Counseled for compliance with the medications Advised DASH diet and moderate exercise/walking, at least 150 mins/week

## 2021-10-26 NOTE — Assessment & Plan Note (Signed)
Smokes about 0.5 pack/day now, cut down from 1 pack/day  Asked about quitting: confirms that he currently smokes cigarettes Advise to quit smoking: Educated about QUITTING to reduce the risk of cancer, cardio and cerebrovascular disease. Assess willingness: Unwilling to quit at this time, but is working on cutting back. Assist with counseling and pharmacotherapy: Counseled for 5 minutes and literature provided. Arrange for follow up: Follow up in 3 months and continue to offer help.

## 2021-10-26 NOTE — Assessment & Plan Note (Signed)

## 2021-10-27 ENCOUNTER — Encounter: Payer: Medicare Other | Admitting: Internal Medicine

## 2021-10-27 DIAGNOSIS — Z23 Encounter for immunization: Secondary | ICD-10-CM | POA: Diagnosis not present

## 2021-12-03 ENCOUNTER — Ambulatory Visit (HOSPITAL_COMMUNITY)
Admission: RE | Admit: 2021-12-03 | Discharge: 2021-12-03 | Disposition: A | Discharge status: Home / Self Care | Payer: Medicare Other | Source: Ambulatory Visit | Attending: Internal Medicine | Admitting: Internal Medicine

## 2021-12-03 ENCOUNTER — Other Ambulatory Visit: Payer: Self-pay

## 2021-12-03 DIAGNOSIS — F17210 Nicotine dependence, cigarettes, uncomplicated: Secondary | ICD-10-CM | POA: Insufficient documentation

## 2021-12-03 DIAGNOSIS — J439 Emphysema, unspecified: Secondary | ICD-10-CM | POA: Insufficient documentation

## 2021-12-03 DIAGNOSIS — I2510 Atherosclerotic heart disease of native coronary artery without angina pectoris: Secondary | ICD-10-CM | POA: Diagnosis not present

## 2021-12-03 DIAGNOSIS — I700 Atherosclerosis of aorta: Secondary | ICD-10-CM | POA: Diagnosis not present

## 2021-12-03 DIAGNOSIS — Z122 Encounter for screening for malignant neoplasm of respiratory organs: Secondary | ICD-10-CM | POA: Insufficient documentation

## 2021-12-07 ENCOUNTER — Encounter: Payer: Self-pay | Admitting: Internal Medicine

## 2021-12-07 DIAGNOSIS — I700 Atherosclerosis of aorta: Secondary | ICD-10-CM | POA: Insufficient documentation

## 2022-03-31 ENCOUNTER — Encounter (INDEPENDENT_AMBULATORY_CARE_PROVIDER_SITE_OTHER): Payer: Self-pay | Admitting: Gastroenterology

## 2022-03-31 ENCOUNTER — Ambulatory Visit (INDEPENDENT_AMBULATORY_CARE_PROVIDER_SITE_OTHER): Payer: Medicare Other | Admitting: Gastroenterology

## 2022-03-31 VITALS — BP 163/81 | HR 76 | Temp 97.7°F | Ht 67.0 in | Wt 149.9 lb

## 2022-03-31 DIAGNOSIS — Z86010 Personal history of colonic polyps: Secondary | ICD-10-CM

## 2022-03-31 DIAGNOSIS — Z8501 Personal history of malignant neoplasm of esophagus: Secondary | ICD-10-CM

## 2022-03-31 NOTE — Progress Notes (Signed)
? ?Referring Provider: Lindell Spar, MD ?Primary Care Physician:  Lindell Spar, MD ?Primary GI Physician: Laural Golden ? ?Chief Complaint  ?Patient presents with  ? 1 year follow up  ?  Patient states he is doing well at this time he denies any new or worsening symptoms   ? ?HPI:   ?Eddie [name] is a 76 y.o. male with past medical history of esophageal squamous cell carcinoma (2013), s/p chemoradiation,  ? ?Patient presenting today for follow up. ? ?History of esophageal squamous cell carcinoma, diagnosed  in September 2013 when he required PEG followed by chemoradiation.  PEG was subsequently removed.  has done well without dysphagia or GERD since. ? ?Last seen virtually on 12/28/20 for follow up, doing well at that time without any GI symptoms. Recommended colonoscopy be updated shortly thereafter, done in April 2022, as outlined below.  ? ?Today, pattient states that he is dong well. No complaints today. No red flag symptoms. Patient denies melena, hematochezia, nausea, vomiting, diarrhea, constipation, dysphagia, odyonophagia, early satiety or weight loss. States appetite is good. He is having no episodes of heartburn or acid regurgitation.  ? ?Last Colonoscopy:03/25/21- Diverticulosis in the sigmoid colon. ?- External hemorrhoids. ?- No specimens collected. ?Last Endoscopy:2013 ? ?Recommendations:  ?Repeat colonoscopy April 2029 ? ?Past Medical History:  ?Diagnosis Date  ? Chronic back pain   ? Duodenal ulcer 09/2012  ? GERD (gastroesophageal reflux disease)   ? Helicobacter pylori (H. pylori) infection 09/2012  ? Hypertension   ? Severe malnutrition ([place]) 10/26/2012  ? Squamous cell carcinoma Oct 2014  ? Esophogus  ? Squamous cell carcinoma of esophagus ([place]) 09/25/2012  ? ? ?Past Surgical History:  ?Procedure Laterality Date  ? COLONOSCOPY N/A 12/26/2013  ? Procedure: COLONOSCOPY;  Surgeon: Rogene Houston, MD;  [facility];  Service: Endoscopy;  Laterality: N/A;  830  ? COLONOSCOPY N/A 03/25/2021  ?  Procedure: COLONOSCOPY;  Surgeon: Rogene Houston, MD;  [facility];  Service: Endoscopy;  Laterality: N/A;  AM  ? ESOPHAGOGASTRODUODENOSCOPY  09/21/2012  ? Procedure: ESOPHAGOGASTRODUODENOSCOPY (EGD);  Surgeon: Rogene Houston, MD;  [facility];  Service: Endoscopy;  Laterality: N/A;  345  ? PEG PLACEMENT  09/21/2012  ? Procedure: PERCUTANEOUS ENDOSCOPIC GASTROSTOMY (PEG) PLACEMENT;  Surgeon: Rogene Houston, MD;  [facility];  Service: Endoscopy;  Laterality: N/A;  ? PEG TUBE PLACEMENT  09/21/2012  ? PORTACATH PLACEMENT    ? REMOVAL OF GASTROSTOMY TUBE    ? august 2014  ? ? ?Current Outpatient Medications  ?Medication Sig Dispense Refill  ? amLODipine (NORVASC) 10 MG tablet TAKE 1 TABLET BY MOUTH ONCE DAILY 90 tablet 3  ? atorvastatin (LIPITOR) 40 MG tablet TAKE 1 TABLET BY MOUTH ONCE DAILY 90 tablet 3  ? fluticasone (FLONASE) 50 MCG/ACT nasal spray Place 2 sprays into both nostrils daily. 16 g 6  ? gabapentin (NEURONTIN) 300 MG capsule TAKE 2 CAPSULES BY MOUTH AT BEDTIME 180 capsule 3  ? terazosin (HYTRIN) 2 MG capsule TAKE 1 CAPSULE BY MOUTH AT  BEDTIME 90 capsule 3  ? vitamin B-12 (CYANOCOBALAMIN) 500 MCG tablet Take 500 mcg by mouth in the morning.    ? ?No current facility-administered medications for this visit.  ? ? ?Allergies as of 03/31/2022 - Review Complete 10/26/2021  ?Allergen Reaction Noted  ? Motrin [ibuprofen] Swelling 07/16/2012  ? ? ?Family History  ?Problem Relation Age of Onset  ? Stroke Mother   ? Hypertension Father   ?  Cancer Brother   ?     Throat cancer  ? Kidney disease Daughter   ? ? ?Social History  ? ?Socioeconomic History  ? Marital status: Widowed  ?  Spouse name: Not on file  ? Number of children: 4  ? Years of education: 10  ? Highest education level: Not on file  ?Occupational History  ? Not on file  ?Tobacco Use  ? Smoking status: Every Day  ?  Packs/day: 1.00  ?  Years: 50.00  ?  Pack years: 50.00  ?  Types: Cigarettes  ? Smokeless tobacco:  Never  ? Tobacco comments:  ?  1/2 to 1 pack x50 yrs.  ?Substance and Sexual Activity  ? Alcohol use: Yes  ?  Alcohol/week: 6.0 standard drinks  ?  Types: 6 Cans of beer per week  ?  Comment: Patient states he drinks a couple of beers almost daily in the afternoon,  ? Drug use: No  ? Sexual activity: Not on file  ?Other Topics Concern  ? Not on file  ?Social History Narrative  ? Patient lives with friend has 4 children  ? ?Social Determinants of Health  ? ?Financial Resource Strain: Low Risk   ? Difficulty of Paying Living Expenses: Not hard at all  ?Food Insecurity: No Food Insecurity  ? Worried About Charity fundraiser in the Last Year: Never true  ? Ran Out of Food in the Last Year: Never true  ?Transportation Needs: No Transportation Needs  ? Lack of Transportation (Medical): No  ? Lack of Transportation (Non-Medical): No  ?Physical Activity: Sufficiently Active  ? Days of Exercise per Week: 7 days  ? Minutes of Exercise per Session: 50 min  ?Stress: No Stress Concern Present  ? Feeling of Stress : Not at all  ?Social Connections: Moderately Isolated  ? Frequency of Communication with Friends and Family: More than three times a week  ? Frequency of Social Gatherings with Friends and Family: More than three times a week  ? Attends Religious Services: 1 to 4 times per year  ? Active Member of Clubs or Organizations: No  ? Attends Archivist Meetings: Never  ? Marital Status: Widowed  ? ?Review of systems ?General: negative for malaise, night sweats, fever, chills, weight loss ?Neck: Negative for lumps, goiter, pain and significant neck swelling ?Resp: Negative for cough, wheezing, dyspnea at rest ?CV: Negative for chest pain, leg swelling, palpitations, orthopnea ?GI: denies melena, hematochezia, nausea, vomiting, diarrhea, constipation, dysphagia, odyonophagia, early satiety or unintentional weight loss.  ?MSK: Negative for joint pain or swelling, back pain, and muscle pain. ?Derm: Negative for itching  or rash ?Psych: Denies depression, anxiety, memory loss, confusion. No homicidal or suicidal ideation.  ?Heme: Negative for prolonged bleeding, bruising easily, and swollen nodes. ?Endocrine: Negative for cold or heat intolerance, polyuria, polydipsia and goiter. ?Neuro: negative for tremor, gait imbalance, syncope and seizures. ?The remainder of the review of systems is noncontributory. ? ?Physical Exam: ?BP (!) 163/81 (BP Location: Left Arm, Patient Position: Sitting, Cuff Size: Normal)   Pulse 76   Temp 97.7 ?F (36.5 ?C) (Oral)   Ht '5\' 7"'$  (1.702 m)   Wt 149 lb 14.4 oz (68 kg)   BMI 23.48 kg/m?  ?General:   Alert and oriented. No distress noted. Pleasant and cooperative.  ?Head:  Normocephalic and atraumatic. ?Eyes:  Conjuctiva clear without scleral icterus. ?Mouth:  Oral mucosa pink and moist. Good dentition. No lesions. ?Heart: Normal rate and rhythm, s1  and s2 heart sounds present.  ?Lungs: Clear lung sounds in all lobes. Respirations equal and unlabored. ?Abdomen:  +BS, soft, non-tender and non-distended. No rebound or guarding. No HSM or masses noted. ?Derm: No palmar erythema or jaundice ?Msk:  Symmetrical without gross deformities. Normal posture. ?Extremities:  Without edema. ?Neurologic:  Alert and  oriented x4 ?Psych:  Alert and cooperative. Normal mood and affect. ? ?Invalid input(s): 6 MONTHS  ? ?ASSESSMENT: ?Eddie [name] is a 76 y.o. male presenting today for routine visit. ? ?History of esophageal squamous cell carcinoma in 2013, s/p chemoradiation. Doing well since with no issues or evidence of recurrence. Denies any GERD symptoms. No red flag symptoms. Patient denies melena, hematochezia, nausea, vomiting, diarrhea, constipation, dysphagia, odyonophagia, early satiety or weight loss.  ? ?History of tubular adenoma, patient is up to date on colonoscopy ? ? ?PLAN:  ?Patient to let us know of any new GI symptoms ? ?Follow Up: ?1 year ? ?Jakyah Bradby L. Alver Sorrow, MSN, APRN, AGNP-C ?Adult-Gerontology Nurse  Practitioner ?[facility] ? ?

## 2022-03-31 NOTE — Patient Instructions (Signed)
Please let us know if you have any new or concerning GI symptoms such as difficulty swallowing, acid reflux symptoms, changes in weight or appetite ? ?Follow up 1 year ?

## 2022-04-25 ENCOUNTER — Ambulatory Visit (INDEPENDENT_AMBULATORY_CARE_PROVIDER_SITE_OTHER): Payer: Medicare Other | Admitting: Internal Medicine

## 2022-04-25 ENCOUNTER — Encounter: Payer: Self-pay | Admitting: Internal Medicine

## 2022-04-25 VITALS — BP 122/72 | HR 68 | Resp 18 | Ht 67.0 in | Wt 148.2 lb

## 2022-04-25 DIAGNOSIS — Z720 Tobacco use: Secondary | ICD-10-CM | POA: Diagnosis not present

## 2022-04-25 DIAGNOSIS — N1831 Chronic kidney disease, stage 3a: Secondary | ICD-10-CM | POA: Diagnosis not present

## 2022-04-25 DIAGNOSIS — J432 Centrilobular emphysema: Secondary | ICD-10-CM | POA: Diagnosis not present

## 2022-04-25 DIAGNOSIS — F17210 Nicotine dependence, cigarettes, uncomplicated: Secondary | ICD-10-CM | POA: Diagnosis not present

## 2022-04-25 DIAGNOSIS — B0229 Other postherpetic nervous system involvement: Secondary | ICD-10-CM

## 2022-04-25 DIAGNOSIS — I700 Atherosclerosis of aorta: Secondary | ICD-10-CM | POA: Diagnosis not present

## 2022-04-25 DIAGNOSIS — J302 Other seasonal allergic rhinitis: Secondary | ICD-10-CM | POA: Diagnosis not present

## 2022-04-25 DIAGNOSIS — I10 Essential (primary) hypertension: Secondary | ICD-10-CM

## 2022-04-25 MED ORDER — AZELASTINE HCL 0.1 % NA SOLN: 2.0000 | Freq: Two times a day (BID) | NASAL | 12 refills | Status: DC

## 2022-04-25 MED ORDER — CHANTIX STARTING MONTH PAK 0.5 MG X 11 & 1 MG X 42 PO TBPK: ORAL_TABLET | ORAL | 0 refills | Status: AC

## 2022-04-25 NOTE — Progress Notes (Unsigned)
Established Patient Office Visit  Subjective:  Patient ID: Eddie [name], male    DOB: [birth year]  Age: 76 y.o. MRN: [id number]  CC:  Chief Complaint  Patient presents with   Follow-up    6 month follow up HTN and HLD     HPI Eddie [name] is a 76 y.o. male with past medical history of HTN, esophageal ca. s/p chemoradiation, COPD, postherpetic neuralgia, CKD and colonic polyps who presents for f/u of his chronic medical conditions.  BP is well-controlled. Takes medications regularly. Patient denies headache, dizziness, chest pain, dyspnea or palpitations.   He has CKD stage 3a.  His GFR had improved slightly compared to prior in the last visit.  Denies any dysuria, hematuria, urinary resistance or hesitance. Does report nocturia.  He has cut down smoking to half pack per day now. He is interested in medical help to quit smoking.  He uses Flonase for allergic rhinitis, but has received a letter from his insurance about its recall.   Past Medical History:  Diagnosis Date   Chronic back pain    Duodenal ulcer 09/2012   GERD (gastroesophageal reflux disease)    Helicobacter pylori (H. pylori) infection 09/2012   Hypertension    Severe malnutrition ([place]) 10/26/2012   Squamous cell carcinoma Oct 2014   Esophogus   Squamous cell carcinoma of esophagus ([place]) 09/25/2012    Past Surgical History:  Procedure Laterality Date   COLONOSCOPY N/A 12/26/2013   Procedure: COLONOSCOPY;  Surgeon: Rogene Houston, MD;  [facility];  Service: Endoscopy;  Laterality: N/A;  830   COLONOSCOPY N/A 03/25/2021   Procedure: COLONOSCOPY;  Surgeon: Rogene Houston, MD;  [facility];  Service: Endoscopy;  Laterality: N/A;  AM   ESOPHAGOGASTRODUODENOSCOPY  09/21/2012   Procedure: ESOPHAGOGASTRODUODENOSCOPY (EGD);  Surgeon: Rogene Houston, MD;  [facility];  Service: Endoscopy;  Laterality: N/A;  345   PEG PLACEMENT  09/21/2012   Procedure: PERCUTANEOUS ENDOSCOPIC  GASTROSTOMY (PEG) PLACEMENT;  Surgeon: Rogene Houston, MD;  [facility];  Service: Endoscopy;  Laterality: N/A;   PEG TUBE PLACEMENT  09/21/2012   PORTACATH PLACEMENT     REMOVAL OF GASTROSTOMY TUBE     august 2014    Family History  Problem Relation Age of Onset   Stroke Mother    Hypertension Father    Cancer Brother        Throat cancer   Kidney disease Daughter     Social History   Socioeconomic History   Marital status: Widowed    Spouse name: Not on file   Number of children: 4   Years of education: 10   Highest education level: Not on file  Occupational History   Not on file  Tobacco Use   Smoking status: Every Day    Packs/day: 1.00    Years: 50.00    Pack years: 50.00    Types: Cigarettes   Smokeless tobacco: Never   Tobacco comments:    1/2 to 1 pack x50 yrs.  Substance and Sexual Activity   Alcohol use: Yes    Alcohol/week: 6.0 standard drinks    Types: 6 Cans of beer per week    Comment: Patient states he drinks a couple of beers almost daily in the afternoon,   Drug use: No   Sexual activity: Not on file  Other Topics Concern   Not on file  Social History Narrative   Patient lives with  friend has 4 children   Social Determinants of Radio broadcast assistant Strain: Low Risk    Difficulty of Paying Living Expenses: Not hard at all  Food Insecurity: No Food Insecurity   Worried About Charity fundraiser in the Last Year: Never true   Arboriculturist in the Last Year: Never true  Transportation Needs: No Transportation Needs   Lack of Transportation (Medical): No   Lack of Transportation (Non-Medical): No  Physical Activity: Sufficiently Active   Days of Exercise per Week: 7 days   Minutes of Exercise per Session: 50 min  Stress: No Stress Concern Present   Feeling of Stress : Not at all  Social Connections: Moderately Isolated   Frequency of Communication with Friends and Family: More than three times a week   Frequency of  Social Gatherings with Friends and Family: More than three times a week   Attends Religious Services: 1 to 4 times per year   Active Member of [employer] or Organizations: No   Attends Archivist Meetings: Never   Marital Status: Widowed  Human resources officer Violence: Not At Risk   Fear of Current or Ex-Partner: No   Emotionally Abused: No   Physically Abused: No   Sexually Abused: No    Outpatient Medications Prior to Visit  Medication Sig Dispense Refill   amLODipine (NORVASC) 10 MG tablet TAKE 1 TABLET BY MOUTH ONCE DAILY 90 tablet 3   atorvastatin (LIPITOR) 40 MG tablet TAKE 1 TABLET BY MOUTH ONCE DAILY 90 tablet 3   gabapentin (NEURONTIN) 300 MG capsule TAKE 2 CAPSULES BY MOUTH AT BEDTIME 180 capsule 3   terazosin (HYTRIN) 2 MG capsule TAKE 1 CAPSULE BY MOUTH AT  BEDTIME 90 capsule 3   vitamin B-12 (CYANOCOBALAMIN) 500 MCG tablet Take 500 mcg by mouth in the morning.     fluticasone (FLONASE) 50 MCG/ACT nasal spray Place 2 sprays into both nostrils daily. 16 g 6   No facility-administered medications prior to visit.    Allergies  Allergen Reactions   Motrin [Ibuprofen] Swelling    ROS Review of Systems  Constitutional:  Negative for chills and fever.  HENT:  Positive for congestion and postnasal drip. Negative for sore throat.   Eyes:  Negative for pain and discharge.  Respiratory:  Negative for cough and shortness of breath.   Cardiovascular:  Negative for chest pain and palpitations.  Gastrointestinal:  Negative for diarrhea, nausea and vomiting.  Endocrine: Negative for polydipsia and polyuria.  Genitourinary:  Negative for dysuria and hematuria.  Musculoskeletal:  Negative for neck pain and neck stiffness.  Skin:  Negative for rash.  Neurological:  Negative for dizziness, weakness, numbness and headaches.  Psychiatric/Behavioral:  Negative for agitation and behavioral problems.      Objective:    Physical Exam Vitals reviewed.  Constitutional:       General: He is not in acute distress.    Appearance: He is not diaphoretic.  HENT:     Head: Normocephalic and atraumatic.     Nose: Congestion present.     Mouth/Throat:     Mouth: Mucous membranes are moist.  Eyes:     General: No scleral icterus.    Extraocular Movements: Extraocular movements intact.  Cardiovascular:     Rate and Rhythm: Normal rate and regular rhythm.     Pulses: Normal pulses.     Heart sounds: Normal heart sounds. No murmur heard. Pulmonary:     Breath sounds: Normal breath  sounds. No wheezing or rales.  Musculoskeletal:     Cervical back: Neck supple. No tenderness.     Right lower leg: No edema.     Left lower leg: No edema.  Skin:    General: Skin is warm.     Findings: No rash.  Neurological:     General: No focal deficit present.     Mental Status: He is alert and oriented to person, place, and time.     Sensory: No sensory deficit.     Motor: No weakness.  Psychiatric:        Mood and Affect: Mood normal.        Behavior: Behavior normal.    BP 122/72 (BP Location: Right Arm, Patient Position: Sitting, Cuff Size: Normal)   Pulse 68   Resp 18   Ht 5' 7"  (1.702 m)   Wt 148 lb 3.2 oz (67.2 kg)   SpO2 97%   BMI 23.21 kg/m  Wt Readings from Last 3 Encounters:  04/25/22 148 lb 3.2 oz (67.2 kg)  03/31/22 149 lb 14.4 oz (68 kg)  12/03/21 148 lb (67.1 kg)    Lab Results  Component Value Date   TSH 3.630 10/22/2021   Lab Results  Component Value Date   WBC 11.6 (H) 04/25/2022   HGB 15.8 04/25/2022   HCT 45.9 04/25/2022   MCV 96 04/25/2022   PLT 239 04/25/2022   Lab Results  Component Value Date   NA 132 (L) 04/25/2022   K 4.8 04/25/2022   CO2 19 (L) 04/25/2022   GLUCOSE 84 04/25/2022   BUN 18 04/25/2022   CREATININE 1.65 (H) 04/25/2022   BILITOT 1.0 10/22/2021   ALKPHOS 96 10/22/2021   AST 22 10/22/2021   ALT 12 10/22/2021   PROT 7.1 10/22/2021   ALBUMIN 4.9 (H) 10/22/2021   CALCIUM 9.4 04/25/2022   EGFR 43 (L) 04/25/2022    Lab Results  Component Value Date   CHOL 164 04/25/2022   Lab Results  Component Value Date   HDL 72 04/25/2022   Lab Results  Component Value Date   LDLCALC 63 04/25/2022   Lab Results  Component Value Date   TRIG 176 (H) 04/25/2022   Lab Results  Component Value Date   CHOLHDL 2.3 04/25/2022   Lab Results  Component Value Date   HGBA1C 4.4 (L) 10/22/2021      Assessment & Plan:   Problem List Items Addressed This Visit       Cardiovascular and Mediastinum   Essential hypertension, benign - Primary    BP Readings from Last 1 Encounters:  04/25/22 122/72  Well-controlled with Amlodipine Counseled for compliance with the medications Advised DASH diet and moderate exercise/walking, at least 150 mins/week      Aortic atherosclerosis (HCC)    Noted noted on CT chest On statin BP WNL       Relevant Orders   Lipid Profile (Completed)     Respiratory   Pulmonary emphysema ([place])    Is asymptomatic currently Needs to quit smoking       Relevant Medications   Varenicline Tartrate, Starter, (CHANTIX STARTING MONTH PAK) 0.5 MG X 11 & 1 MG X 42 TBPK   azelastine (ASTELIN) 0.1 % nasal spray     Nervous and Auditory   Post herpetic neuralgia    On Gabapentin         Genitourinary   CKD (chronic kidney disease) stage 3, GFR 30-59 ml/min (HCC)    Check  CMP Avoid nephrotoxic agents Plan to start ACEi or ARB based on next CMP and UA       Relevant Orders   CBC with Differential/Platelet (Completed)   Basic Metabolic Panel (BMET) (Completed)     Other   Tobacco abuse    Smokes about 0.5 pack/day now, cut down from 1 pack/day  Asked about quitting: confirms that he currently smokes cigarettes Advise to quit smoking: Educated about QUITTING to reduce the risk of cancer, cardio and cerebrovascular disease. Assess willingness: Unwilling to quit at this time, but is working on cutting back. Assist with counseling and pharmacotherapy: Counseled for 5  minutes and literature provided. Started Chantix. Arrange for follow up: Follow up in 3 months and continue to offer help.      Relevant Medications   Varenicline Tartrate, Starter, (CHANTIX STARTING MONTH PAK) 0.5 MG X 11 & 1 MG X 42 TBPK   Seasonal allergies    Switched from Flonase to azelastine nasal spray       Relevant Medications   azelastine (ASTELIN) 0.1 % nasal spray    Meds ordered this encounter  Medications   Varenicline Tartrate, Starter, (CHANTIX STARTING MONTH PAK) 0.5 MG X 11 & 1 MG X 42 TBPK    Sig: Take 0.5 mg by mouth daily for 3 days, THEN 0.5 mg 2 (two) times daily for 4 days, THEN 1 mg 2 (two) times daily for 21 days.    Dispense:  53 each    Refill:  0   azelastine (ASTELIN) 0.1 % nasal spray    Sig: Place 2 sprays into both nostrils 2 (two) times daily. Use in each nostril as directed    Dispense:  30 mL    Refill:  12    Follow-up: Return in about 6 months (around 10/26/2022) for Annual physical (after 11/22).    Lindell Spar, MD

## 2022-04-25 NOTE — Patient Instructions (Signed)
Please start taking Chantix as prescribed for smoking cessation.  Please use Azelastine nasal spray for allergies.  Please continue taking other medications as prescribed.  Please continue to follow low salt diet and ambulate as tolerated.

## 2022-04-26 LAB — BASIC METABOLIC PANEL
BUN/Creatinine Ratio: 11 (ref 10–24)
BUN: 18 mg/dL (ref 8–27)
CO2: 19 mmol/L — ABNORMAL LOW (ref 20–29)
Calcium: 9.4 mg/dL (ref 8.6–10.2)
Chloride: 101 mmol/L (ref 96–106)
Creatinine, Ser: 1.65 mg/dL — ABNORMAL HIGH (ref 0.76–1.27)
Glucose: 84 mg/dL (ref 70–99)
Potassium: 4.8 mmol/L (ref 3.5–5.2)
Sodium: 132 mmol/L — ABNORMAL LOW (ref 134–144)
eGFR: 43 mL/min/{1.73_m2} — ABNORMAL LOW (ref >59)

## 2022-04-26 LAB — CBC WITH DIFFERENTIAL/PLATELET
Basophils Absolute: 0.1 10*3/uL (ref 0.0–0.2)
Basos: 1 %
EOS (ABSOLUTE): 0.3 10*3/uL (ref 0.0–0.4)
Eos: 2 %
Hematocrit: 45.9 % (ref 37.5–51.0)
Hemoglobin: 15.8 g/dL (ref 13.0–17.7)
Immature Grans (Abs): 0.1 10*3/uL (ref 0.0–0.1)
Immature Granulocytes: 1 %
Lymphocytes Absolute: 2.2 10*3/uL (ref 0.7–3.1)
Lymphs: 19 %
MCH: 33 pg (ref 26.6–33.0)
MCHC: 34.4 g/dL (ref 31.5–35.7)
MCV: 96 fL (ref 79–97)
Monocytes Absolute: 1 10*3/uL — ABNORMAL HIGH (ref 0.1–0.9)
Monocytes: 9 %
Neutrophils Absolute: 8 10*3/uL — ABNORMAL HIGH (ref 1.4–7.0)
Neutrophils: 68 %
Platelets: 239 10*3/uL (ref 150–450)
RBC: 4.79 x10E6/uL (ref 4.14–5.80)
RDW: 10.9 % — ABNORMAL LOW (ref 11.6–15.4)
WBC: 11.6 10*3/uL — ABNORMAL HIGH (ref 3.4–10.8)

## 2022-04-26 LAB — LIPID PANEL
Chol/HDL Ratio: 2.3 ratio (ref 0.0–5.0)
Cholesterol, Total: 164 mg/dL (ref 100–199)
HDL: 72 mg/dL (ref >39)
LDL Chol Calc (NIH): 63 mg/dL (ref 0–99)
Triglycerides: 176 mg/dL — ABNORMAL HIGH (ref 0–149)
VLDL Cholesterol Cal: 29 mg/dL (ref 5–40)

## 2022-04-26 NOTE — Assessment & Plan Note (Signed)
Check CMP Avoid nephrotoxic agents Plan to start ACEi or ARB based on next CMP and UA

## 2022-04-26 NOTE — Assessment & Plan Note (Signed)
Switched from [facility] to azelastine nasal spray

## 2022-04-26 NOTE — Assessment & Plan Note (Signed)
Is asymptomatic currently Needs to quit smoking

## 2022-04-26 NOTE — Assessment & Plan Note (Signed)
On Gabapentin 

## 2022-04-26 NOTE — Assessment & Plan Note (Signed)
Smokes about 0.5 pack/day now, cut down from 1 pack/day  Asked about quitting: confirms that he currently smokes cigarettes Advise to quit smoking: Educated about QUITTING to reduce the risk of cancer, cardio and cerebrovascular disease. Assess willingness: Unwilling to quit at this time, but is working on cutting back. Assist with counseling and pharmacotherapy: Counseled for 5 minutes and literature provided. Started Chantix. Arrange for follow up: Follow up in 3 months and continue to offer help. 

## 2022-04-26 NOTE — Assessment & Plan Note (Signed)
BP Readings from Last 1 Encounters:  04/25/22 122/72   Well-controlled with Amlodipine Counseled for compliance with the medications Advised DASH diet and moderate exercise/walking, at least 150 mins/week

## 2022-04-26 NOTE — Assessment & Plan Note (Signed)
Noted noted on CT chest On statin BP WNL

## 2022-07-18 ENCOUNTER — Other Ambulatory Visit: Payer: Self-pay | Admitting: Family Medicine

## 2022-07-18 DIAGNOSIS — N400 Enlarged prostate without lower urinary tract symptoms: Secondary | ICD-10-CM

## 2022-07-18 DIAGNOSIS — I10 Essential (primary) hypertension: Secondary | ICD-10-CM

## 2022-07-18 DIAGNOSIS — I6523 Occlusion and stenosis of bilateral carotid arteries: Secondary | ICD-10-CM

## 2022-07-18 DIAGNOSIS — B0229 Other postherpetic nervous system involvement: Secondary | ICD-10-CM

## 2022-10-31 ENCOUNTER — Encounter: Payer: Self-pay | Admitting: Internal Medicine

## 2022-10-31 ENCOUNTER — Ambulatory Visit (INDEPENDENT_AMBULATORY_CARE_PROVIDER_SITE_OTHER): Payer: Medicare Other | Admitting: Internal Medicine

## 2022-10-31 VITALS — BP 132/70 | HR 77 | Ht 67.0 in | Wt 152.2 lb

## 2022-10-31 DIAGNOSIS — Z720 Tobacco use: Secondary | ICD-10-CM | POA: Diagnosis not present

## 2022-10-31 DIAGNOSIS — Z0001 Encounter for general adult medical examination with abnormal findings: Secondary | ICD-10-CM

## 2022-10-31 DIAGNOSIS — I10 Essential (primary) hypertension: Secondary | ICD-10-CM | POA: Diagnosis not present

## 2022-10-31 DIAGNOSIS — E782 Mixed hyperlipidemia: Secondary | ICD-10-CM | POA: Diagnosis not present

## 2022-10-31 DIAGNOSIS — Z23 Encounter for immunization: Secondary | ICD-10-CM | POA: Diagnosis not present

## 2022-10-31 DIAGNOSIS — E785 Hyperlipidemia, unspecified: Secondary | ICD-10-CM | POA: Diagnosis not present

## 2022-10-31 DIAGNOSIS — B0229 Other postherpetic nervous system involvement: Secondary | ICD-10-CM | POA: Diagnosis not present

## 2022-10-31 DIAGNOSIS — N400 Enlarged prostate without lower urinary tract symptoms: Secondary | ICD-10-CM

## 2022-10-31 DIAGNOSIS — N1831 Chronic kidney disease, stage 3a: Secondary | ICD-10-CM

## 2022-10-31 DIAGNOSIS — R7301 Impaired fasting glucose: Secondary | ICD-10-CM | POA: Diagnosis not present

## 2022-10-31 DIAGNOSIS — E559 Vitamin D deficiency, unspecified: Secondary | ICD-10-CM

## 2022-10-31 DIAGNOSIS — E039 Hypothyroidism, unspecified: Secondary | ICD-10-CM | POA: Diagnosis not present

## 2022-10-31 NOTE — Assessment & Plan Note (Addendum)
BP Readings from Last 1 Encounters:  10/31/22 132/70   Well-controlled with Amlodipine Counseled for compliance with the medications Advised DASH diet and moderate exercise/walking, at least 150 mins/week

## 2022-10-31 NOTE — Assessment & Plan Note (Signed)
Smokes about 0.5 pack/day now, cut down from 1 pack/day  Asked about quitting: confirms that he currently smokes cigarettes Advise to quit smoking: Educated about QUITTING to reduce the risk of cancer, cardio and cerebrovascular disease. Assess willingness: Unwilling to quit at this time, but is working on cutting back. Assist with counseling and pharmacotherapy: Counseled for 5 minutes and literature provided. Started Chantix. Arrange for follow up: Follow up in 3 months and continue to offer help.

## 2022-10-31 NOTE — Progress Notes (Signed)
Established Patient Office Visit  Subjective:  Patient ID: Eddie [name], male    DOB: Jan 04, 1946  Age: 76 y.o. MRN: [id number]  CC:  Chief Complaint  Patient presents with   Annual Exam    CPE    HPI Eddie [name] is a 76 y.o. male with past medical history of HTN, esophageal ca. s/p chemoradiation, COPD, postherpetic neuralgia, CKD and colonic polyps who presents for annual physical.  BP is well-controlled. Takes medications regularly. Patient denies headache, dizziness, chest pain, dyspnea or palpitations.   He has CKD stage 3a.  His GFR had worsened slightly compared to prior in the last visit.  Denies any dysuria, hematuria, urinary resistance or hesitance. Does report nocturia, takes terazosin for BPH.  He takes Gabapentin for postherpetic neuralgia. Denies any rash or pain currently.      Past Medical History:  Diagnosis Date   Chronic back pain    Duodenal ulcer 09/2012   GERD (gastroesophageal reflux disease)    Helicobacter pylori (H. pylori) infection 09/2012   Hypertension    Severe malnutrition ([place]) 10/26/2012   Squamous cell carcinoma Oct 2014   Esophogus   Squamous cell carcinoma of esophagus ([place]) 09/25/2012    Past Surgical History:  Procedure Laterality Date   COLONOSCOPY N/A 12/26/2013   Procedure: COLONOSCOPY;  Surgeon: Rogene Houston, MD;  [facility];  Service: Endoscopy;  Laterality: N/A;  830   COLONOSCOPY N/A 03/25/2021   Procedure: COLONOSCOPY;  Surgeon: Rogene Houston, MD;  [facility];  Service: Endoscopy;  Laterality: N/A;  AM   ESOPHAGOGASTRODUODENOSCOPY  09/21/2012   Procedure: ESOPHAGOGASTRODUODENOSCOPY (EGD);  Surgeon: Rogene Houston, MD;  [facility];  Service: Endoscopy;  Laterality: N/A;  345   PEG PLACEMENT  09/21/2012   Procedure: PERCUTANEOUS ENDOSCOPIC GASTROSTOMY (PEG) PLACEMENT;  Surgeon: Rogene Houston, MD;  [facility];  Service: Endoscopy;  Laterality: N/A;   PEG TUBE  PLACEMENT  09/21/2012   PORTACATH PLACEMENT     REMOVAL OF GASTROSTOMY TUBE     august 2014    Family History  Problem Relation Age of Onset   Stroke Mother    Hypertension Father    Cancer Brother        Throat cancer   Kidney disease Daughter     Social History   Socioeconomic History   Marital status: Widowed    Spouse name: Not on file   Number of children: 4   Years of education: 10   Highest education level: Not on file  Occupational History   Not on file  Tobacco Use   Smoking status: Every Day    Packs/day: 1.00    Years: 50.00    Total pack years: 50.00    Types: Cigarettes   Smokeless tobacco: Never   Tobacco comments:    1/2 to 1 pack x50 yrs.  Substance and Sexual Activity   Alcohol use: Yes    Alcohol/week: 6.0 standard drinks of alcohol    Types: 6 Cans of beer per week    Comment: Patient states he drinks a couple of beers almost daily in the afternoon,   Drug use: No   Sexual activity: Not on file  Other Topics Concern   Not on file  Social History Narrative   Patient lives with friend has 4 children   Social Determinants of Health   Financial Resource Strain: Low Risk  (10/22/2021)   Overall Financial Resource Strain ([place])  Difficulty of Paying Living Expenses: Not hard at all  Food Insecurity: No Food Insecurity (10/22/2021)   Hunger Vital Sign    Worried About Running Out of Food in the Last Year: Never true    Ran Out of Food in the Last Year: Never true  Transportation Needs: No Transportation Needs (10/22/2021)   PRAPARE - Hydrologist (Medical): No    Lack of Transportation (Non-Medical): No  Physical Activity: Sufficiently Active (10/22/2021)   Exercise Vital Sign    Days of Exercise per Week: 7 days    Minutes of Exercise per Session: 50 min  Stress: No Stress Concern Present (10/22/2021)   [place]    Feeling of Stress : Not at  all  Social Connections: Moderately Isolated (10/22/2021)   Social Connection and Isolation Panel [NHANES]    Frequency of Communication with Friends and Family: More than three times a week    Frequency of Social Gatherings with Friends and Family: More than three times a week    Attends Religious Services: 1 to 4 times per year    Active Member of [employer] or Organizations: No    Attends Archivist Meetings: Never    Marital Status: Widowed  Intimate Partner Violence: Not At Risk (10/22/2021)   Humiliation, Afraid, Rape, and Kick questionnaire    Fear of Current or Ex-Partner: No    Emotionally Abused: No    Physically Abused: No    Sexually Abused: No    Outpatient Medications Prior to Visit  Medication Sig Dispense Refill   amLODipine (NORVASC) 10 MG tablet TAKE 1 TABLET BY MOUTH ONCE  DAILY 100 tablet 2   atorvastatin (LIPITOR) 40 MG tablet TAKE 1 TABLET BY MOUTH ONCE  DAILY 100 tablet 2   azelastine (ASTELIN) 0.1 % nasal spray Place 2 sprays into both nostrils 2 (two) times daily. Use in each nostril as directed 30 mL 12   gabapentin (NEURONTIN) 300 MG capsule TAKE 2 CAPSULES BY MOUTH AT  BEDTIME 200 capsule 2   terazosin (HYTRIN) 2 MG capsule TAKE 1 CAPSULE BY MOUTH AT  BEDTIME 100 capsule 2   vitamin B-12 (CYANOCOBALAMIN) 500 MCG tablet Take 500 mcg by mouth in the morning.     No facility-administered medications prior to visit.    Allergies  Allergen Reactions   Motrin [Ibuprofen] Swelling    ROS Review of Systems  Constitutional:  Negative for chills and fever.  HENT:  Negative for congestion, postnasal drip and sore throat.   Eyes:  Negative for pain and discharge.  Respiratory:  Negative for cough and shortness of breath.   Cardiovascular:  Negative for chest pain and palpitations.  Gastrointestinal:  Negative for diarrhea, nausea and vomiting.  Endocrine: Negative for polydipsia and polyuria.  Genitourinary:  Negative for dysuria and hematuria.   Musculoskeletal:  Negative for neck pain and neck stiffness.  Skin:  Negative for rash.  Neurological:  Negative for dizziness, weakness, numbness and headaches.  Psychiatric/Behavioral:  Negative for agitation and behavioral problems.       Objective:    Physical Exam Vitals reviewed.  Constitutional:      General: He is not in acute distress.    Appearance: He is not diaphoretic.  HENT:     Head: Normocephalic and atraumatic.     Nose: No congestion.     Mouth/Throat:     Mouth: Mucous membranes are moist.  Eyes:  General: No scleral icterus.    Extraocular Movements: Extraocular movements intact.  Cardiovascular:     Rate and Rhythm: Normal rate and regular rhythm.     Pulses: Normal pulses.     Heart sounds: Normal heart sounds. No murmur heard. Pulmonary:     Breath sounds: Normal breath sounds. No wheezing or rales.  Musculoskeletal:     Cervical back: Neck supple. No tenderness.     Right lower leg: No edema.     Left lower leg: No edema.  Skin:    General: Skin is warm.     Findings: No rash.  Neurological:     General: No focal deficit present.     Mental Status: He is alert and oriented to person, place, and time.     Sensory: No sensory deficit.     Motor: No weakness.  Psychiatric:        Mood and Affect: Mood normal.        Behavior: Behavior normal.     BP 132/70 (BP Location: Left Arm, Cuff Size: Normal)   Pulse 77   Ht _0  (1.702 m)   Wt 152 lb 3.2 oz (69 kg)   SpO2 96%   BMI 23.84 kg/m  Wt Readings from Last 3 Encounters:  10/31/22 152 lb 3.2 oz (69 kg)  04/25/22 148 lb 3.2 oz (67.2 kg)  03/31/22 149 lb 14.4 oz (68 kg)    Lab Results  Component Value Date   TSH 3.630 10/22/2021   Lab Results  Component Value Date   WBC 11.6 (H) 04/25/2022   HGB 15.8 04/25/2022   HCT 45.9 04/25/2022   MCV 96 04/25/2022   PLT 239 04/25/2022   Lab Results  Component Value Date   NA 132 (L) 04/25/2022   K 4.8 04/25/2022   CO2 19 (L)  04/25/2022   GLUCOSE 84 04/25/2022   BUN 18 04/25/2022   CREATININE 1.65 (H) 04/25/2022   BILITOT 1.0 10/22/2021   ALKPHOS 96 10/22/2021   AST 22 10/22/2021   ALT 12 10/22/2021   PROT 7.1 10/22/2021   ALBUMIN 4.9 (H) 10/22/2021   CALCIUM 9.4 04/25/2022   EGFR 43 (L) 04/25/2022   Lab Results  Component Value Date   CHOL 164 04/25/2022   Lab Results  Component Value Date   HDL 72 04/25/2022   Lab Results  Component Value Date   LDLCALC 63 04/25/2022   Lab Results  Component Value Date   TRIG 176 (H) 04/25/2022   Lab Results  Component Value Date   CHOLHDL 2.3 04/25/2022   Lab Results  Component Value Date   HGBA1C 4.4 (L) 10/22/2021      Assessment & Plan:   Problem List Items Addressed This Visit       Cardiovascular and Mediastinum   Essential hypertension, benign    BP Readings from Last 1 Encounters:  10/31/22 132/70  Well-controlled with Amlodipine Counseled for compliance with the medications Advised DASH diet and moderate exercise/walking, at least 150 mins/week      Relevant Orders   TSH   CMP14+EGFR   CBC with Differential/Platelet     Nervous and Auditory   Post herpetic neuralgia    On Gabapentin        Genitourinary   CKD (chronic kidney disease) stage 3, GFR 30-59 ml/min (HCC)    Check CMP Avoid nephrotoxic agents Plan to start ACEi or ARB based on next CMP and urine microalbumin/creatinine ratio  Relevant Orders   VITAMIN D 25 Hydroxy (Vit-D Deficiency, Fractures)   Hemoglobin A1c   CMP14+EGFR   Urine Microalbumin w/creat. ratio   Benign prostatic hyperplasia without lower urinary tract symptoms    On Terazosin      Relevant Orders   PSA     Other   Tobacco abuse    Smokes about 0.5 pack/day now, cut down from 1 pack/day  Asked about quitting: confirms that he currently smokes cigarettes Advise to quit smoking: Educated about QUITTING to reduce the risk of cancer, cardio and cerebrovascular disease. Assess  willingness: Unwilling to quit at this time, but is working on cutting back. Assist with counseling and pharmacotherapy: Counseled for 5 minutes and literature provided. Started Chantix. Arrange for follow up: Follow up in 3 months and continue to offer help.      Encounter for general adult medical examination with abnormal findings - Primary    Physical exam as documented. Counseling done  re healthy lifestyle involving commitment to 150 minutes exercise per week, heart healthy diet, and attaining healthy weight.The importance of adequate sleep also discussed. Changes in health habits are decided on by the patient with goals and time frames  set for achieving them. Immunization and cancer screening needs are specifically addressed at this visit.      Relevant Orders   TSH   Lipid panel   Hemoglobin A1c   CMP14+EGFR   CBC with Differential/Platelet   Vitamin D deficiency    Last vitamin D Lab Results  Component Value Date   VD25OH 29.6 (L) 10/22/2021  Advised to take vitamin D 2000 IU daily      Relevant Orders   VITAMIN D 25 Hydroxy (Vit-D Deficiency, Fractures)   Mixed hyperlipidemia    On Lipitor Check lipid profile      Relevant Orders   Lipid panel   Other Visit Diagnoses     Need for immunization against influenza       Relevant Orders   Flu Vaccine QUAD High Dose(Fluad) (Completed)       No orders of the defined types were placed in this encounter.   Follow-up: Return in about 4 months (around 03/01/2023) for HTN and CKD.    Lindell Spar, MD

## 2022-10-31 NOTE — Patient Instructions (Signed)
Please continue taking medications as prescribed.  Please continue to follow low salt diet and ambulate as tolerated. 

## 2022-10-31 NOTE — Assessment & Plan Note (Signed)
On Gabapentin 

## 2022-10-31 NOTE — Assessment & Plan Note (Signed)
On Lipitor Check lipid profile

## 2022-10-31 NOTE — Assessment & Plan Note (Signed)

## 2022-10-31 NOTE — Assessment & Plan Note (Signed)
Last vitamin D Lab Results  Component Value Date   VD25OH 29.6 (L) 10/22/2021   Advised to take vitamin D 2000 IU daily

## 2022-10-31 NOTE — Assessment & Plan Note (Signed)
Check CMP Avoid nephrotoxic agents Plan to start ACEi or ARB based on next CMP and urine microalbumin/creatinine ratio

## 2022-10-31 NOTE — Assessment & Plan Note (Signed)
On Terazosin 

## 2022-11-01 ENCOUNTER — Other Ambulatory Visit: Payer: Self-pay

## 2022-11-01 DIAGNOSIS — R972 Elevated prostate specific antigen [PSA]: Secondary | ICD-10-CM

## 2022-11-02 LAB — CMP14+EGFR
ALT: 17 IU/L (ref 0–44)
AST: 24 IU/L (ref 0–40)
Albumin/Globulin Ratio: 1.8 (ref 1.2–2.2)
Albumin: 4.6 g/dL (ref 3.8–4.8)
Alkaline Phosphatase: 113 IU/L (ref 44–121)
BUN/Creatinine Ratio: 7 — ABNORMAL LOW (ref 10–24)
BUN: 12 mg/dL (ref 8–27)
Bilirubin Total: 0.8 mg/dL (ref 0.0–1.2)
CO2: 20 mmol/L (ref 20–29)
Calcium: 9.7 mg/dL (ref 8.6–10.2)
Chloride: 102 mmol/L (ref 96–106)
Creatinine, Ser: 1.61 mg/dL — ABNORMAL HIGH (ref 0.76–1.27)
Globulin, Total: 2.5 g/dL (ref 1.5–4.5)
Glucose: 94 mg/dL (ref 70–99)
Potassium: 4.5 mmol/L (ref 3.5–5.2)
Sodium: 137 mmol/L (ref 134–144)
Total Protein: 7.1 g/dL (ref 6.0–8.5)
eGFR: 45 mL/min/{1.73_m2} — ABNORMAL LOW (ref >59)

## 2022-11-02 LAB — LIPID PANEL
Chol/HDL Ratio: 2 ratio (ref 0.0–5.0)
Cholesterol, Total: 161 mg/dL (ref 100–199)
HDL: 79 mg/dL (ref >39)
LDL Chol Calc (NIH): 62 mg/dL (ref 0–99)
Triglycerides: 112 mg/dL (ref 0–149)
VLDL Cholesterol Cal: 20 mg/dL (ref 5–40)

## 2022-11-02 LAB — HEMOGLOBIN A1C
Est. average glucose Bld gHb Est-mCnc: 85 mg/dL
Hgb A1c MFr Bld: 4.6 % — ABNORMAL LOW (ref 4.8–5.6)

## 2022-11-02 LAB — CBC WITH DIFFERENTIAL/PLATELET
Basophils Absolute: 0.1 10*3/uL (ref 0.0–0.2)
Basos: 1 %
EOS (ABSOLUTE): 0.6 10*3/uL — ABNORMAL HIGH (ref 0.0–0.4)
Eos: 5 %
Hematocrit: 48.2 % (ref 37.5–51.0)
Hemoglobin: 16.1 g/dL (ref 13.0–17.7)
Immature Grans (Abs): 0.1 10*3/uL (ref 0.0–0.1)
Immature Granulocytes: 1 %
Lymphocytes Absolute: 2.8 10*3/uL (ref 0.7–3.1)
Lymphs: 22 %
MCH: 32 pg (ref 26.6–33.0)
MCHC: 33.4 g/dL (ref 31.5–35.7)
MCV: 96 fL (ref 79–97)
Monocytes Absolute: 0.9 10*3/uL (ref 0.1–0.9)
Monocytes: 7 %
Neutrophils Absolute: 8.1 10*3/uL — ABNORMAL HIGH (ref 1.4–7.0)
Neutrophils: 64 %
Platelets: 226 10*3/uL (ref 150–450)
RBC: 5.03 x10E6/uL (ref 4.14–5.80)
RDW: 10.9 % — ABNORMAL LOW (ref 11.6–15.4)
WBC: 12.6 10*3/uL — ABNORMAL HIGH (ref 3.4–10.8)

## 2022-11-02 LAB — PSA: Prostate Specific Ag, Serum: 5.6 ng/mL — ABNORMAL HIGH (ref 0.0–4.0)

## 2022-11-02 LAB — VITAMIN D 25 HYDROXY (VIT D DEFICIENCY, FRACTURES): Vit D, 25-Hydroxy: 46.8 ng/mL (ref 30.0–100.0)

## 2022-11-02 LAB — TSH: TSH: 4.52 u[IU]/mL — ABNORMAL HIGH (ref 0.450–4.500)

## 2022-11-02 LAB — MICROALBUMIN / CREATININE URINE RATIO
Creatinine, Urine: 61.5 mg/dL
Microalb/Creat Ratio: 539 mg/g creat — ABNORMAL HIGH (ref 0–29)
Microalbumin, Urine: 331.6 ug/mL

## 2022-11-15 ENCOUNTER — Ambulatory Visit (INDEPENDENT_AMBULATORY_CARE_PROVIDER_SITE_OTHER): Payer: Medicare Other

## 2022-11-15 DIAGNOSIS — Z0000 Encounter for general adult medical examination without abnormal findings: Secondary | ICD-10-CM | POA: Diagnosis not present

## 2022-11-15 DIAGNOSIS — Z0100 Encounter for examination of eyes and vision without abnormal findings: Secondary | ICD-10-CM | POA: Diagnosis not present

## 2022-11-15 NOTE — Patient Instructions (Signed)
Eddie [name] , Thank you for taking time to come for your Medicare Wellness Visit. I appreciate your ongoing commitment to your health goals. Please review the following plan we discussed and let me know if I can assist you in the future.   Screening recommendations/referrals: Colonoscopy: Completed Recommended yearly ophthalmology/optometry visit for glaucoma screening and checkup Recommended yearly dental visit for hygiene and checkup  Vaccinations: Influenza vaccine: Completed Pneumococcal vaccine: Completed Tdap vaccine: Due Shingles vaccine: Completed    Advanced directives: patient declined  Conditions/risks identified: falls, hypertension  Next appointment: 1 year  Preventive Care 70 Years and Older, Male Preventive care refers to lifestyle choices and visits with your health care provider that can promote health and wellness. What does preventive care include? A yearly physical exam. This is also called an annual well check. Dental exams once or twice a year. Routine eye exams. Ask your health care provider how often you should have your eyes checked. Personal lifestyle choices, including: Daily care of your teeth and gums. Regular physical activity. Eating a healthy diet. Avoiding tobacco and drug use. Limiting alcohol use. Practicing safe sex. Taking low doses of aspirin every day. Taking vitamin and mineral supplements as recommended by your health care provider. What happens during an annual well check? The services and screenings done by your health care provider during your annual well check will depend on your age, overall health, lifestyle risk factors, and family history of disease. Counseling  Your health care provider may ask you questions about your: Alcohol use. Tobacco use. Drug use. Emotional well-being. Home and relationship well-being. Sexual activity. Eating habits. History of falls. Memory and ability to understand (cognition). Work and work  Statistician. Screening  You may have the following tests or measurements: Height, weight, and BMI. Blood pressure. Lipid and cholesterol levels. These may be checked every 5 years, or more frequently if you are over 59 years old. Skin check. Lung cancer screening. You may have this screening every year starting at age 38 if you have a 30-pack-year history of smoking and currently smoke or have quit within the past 15 years. Fecal occult blood test (FOBT) of the stool. You may have this test every year starting at age 35. Flexible sigmoidoscopy or colonoscopy. You may have a sigmoidoscopy every 5 years or a colonoscopy every 10 years starting at age 36. Prostate cancer screening. Recommendations will vary depending on your family history and other risks. Hepatitis C blood test. Hepatitis B blood test. Sexually transmitted disease (STD) testing. Diabetes screening. This is done by checking your blood sugar (glucose) after you have not eaten for a while (fasting). You may have this done every 1-3 years. Abdominal aortic aneurysm (AAA) screening. You may need this if you are a current or former smoker. Osteoporosis. You may be screened starting at age 58 if you are at high risk. Talk with your health care provider about your test results, treatment options, and if necessary, the need for more tests. Vaccines  Your health care provider may recommend certain vaccines, such as: Influenza vaccine. This is recommended every year. Tetanus, diphtheria, and acellular pertussis (Tdap, Td) vaccine. You may need a Td booster every 10 years. Zoster vaccine. You may need this after age 55. Pneumococcal 13-valent conjugate (PCV13) vaccine. One dose is recommended after age 25. Pneumococcal polysaccharide (PPSV23) vaccine. One dose is recommended after age 78. Talk to your health care provider about which screenings and vaccines you need and how often you need them. This information  is not intended to replace  advice given to you by your health care provider. Make sure you discuss any questions you have with your health care provider. Document Released: 12/18/2015 Document Revised: 08/10/2016 Document Reviewed: 09/22/2015 Elsevier Interactive Patient Education  2017 [place] Prevention in the Home Falls can cause injuries. They can happen to people of all ages. There are many things you can do to make your home safe and to help prevent falls. What can I do on the outside of my home? Regularly fix the edges of walkways and driveways and fix any cracks. Remove anything that might make you trip as you walk through a door, such as a raised step or threshold. Trim any bushes or trees on the path to your home. Use bright outdoor lighting. Clear any walking paths of anything that might make someone trip, such as rocks or tools. Regularly check to see if handrails are loose or broken. Make sure that both sides of any steps have handrails. Any raised decks and porches should have guardrails on the edges. Have any leaves, snow, or ice cleared regularly. Use sand or salt on walking paths during winter. Clean up any spills in your garage right away. This includes oil or grease spills. What can I do in the bathroom? Use night lights. Install grab bars by the toilet and in the tub and shower. Do not use towel bars as grab bars. Use non-skid mats or decals in the tub or shower. If you need to sit down in the shower, use a plastic, non-slip stool. Keep the floor dry. Clean up any water that spills on the floor as soon as it happens. Remove soap buildup in the tub or shower regularly. Attach bath mats securely with double-sided non-slip rug tape. Do not have throw rugs and other things on the floor that can make you trip. What can I do in the bedroom? Use night lights. Make sure that you have a light by your bed that is easy to reach. Do not use any sheets or blankets that are too big for your bed.  They should not hang down onto the floor. Have a firm chair that has side arms. You can use this for support while you get dressed. Do not have throw rugs and other things on the floor that can make you trip. What can I do in the kitchen? Clean up any spills right away. Avoid walking on wet floors. Keep items that you use a lot in easy-to-reach places. If you need to reach something above you, use a strong step stool that has a grab bar. Keep electrical cords out of the way. Do not use floor polish or wax that makes floors slippery. If you must use wax, use non-skid floor wax. Do not have throw rugs and other things on the floor that can make you trip. What can I do with my stairs? Do not leave any items on the stairs. Make sure that there are handrails on both sides of the stairs and use them. Fix handrails that are broken or loose. Make sure that handrails are as long as the stairways. Check any carpeting to make sure that it is firmly attached to the stairs. Fix any carpet that is loose or worn. Avoid having throw rugs at the top or bottom of the stairs. If you do have throw rugs, attach them to the floor with carpet tape. Make sure that you have a light switch at the top of  the stairs and the bottom of the stairs. If you do not have them, ask someone to add them for you. What else can I do to help prevent falls? Wear shoes that: Do not have high heels. Have rubber bottoms. Are comfortable and fit you well. Are closed at the toe. Do not wear sandals. If you use a stepladder: Make sure that it is fully opened. Do not climb a closed stepladder. Make sure that both sides of the stepladder are locked into place. Ask someone to hold it for you, if possible. Clearly mark and make sure that you can see: Any grab bars or handrails. First and last steps. Where the edge of each step is. Use tools that help you move around (mobility aids) if they are needed. These  include: Canes. Walkers. Scooters. Crutches. Turn on the lights when you go into a dark area. Replace any light bulbs as soon as they burn out. Set up your furniture so you have a clear path. Avoid moving your furniture around. If any of your floors are uneven, fix them. If there are any pets around you, be aware of where they are. Review your medicines with your doctor. Some medicines can make you feel dizzy. This can increase your chance of falling. Ask your doctor what other things that you can do to help prevent falls. This information is not intended to replace advice given to you by your health care provider. Make sure you discuss any questions you have with your health care provider. Document Released: 09/17/2009 Document Revised: 04/28/2016 Document Reviewed: 12/26/2014 Elsevier Interactive Patient Education  2017 [employer].

## 2022-11-15 NOTE — Progress Notes (Signed)
Subjective:   Eddie [name] is a 76 y.o. male who presents for Medicare Annual/Subsequent preventive examination.  I connected with  [name] on 11/15/22 by a audio enabled telemedicine application and verified that I am speaking with the correct person using two identifiers.  Patient Location: Home  Provider Location: Office/Clinic  I discussed the limitations of evaluation and management by telemedicine. The patient expressed understanding and agreed to proceed.   Review of Systems     Mr. Eddie [name] , Thank you for taking time to come for your Medicare Wellness Visit. I appreciate your ongoing commitment to your health goals. Please review the following plan we discussed and let me know if I can assist you in the future.   These are the goals we discussed:  Goals      Manage My Medicine     Timeframe:  Long-Range Goal Priority:  Medium Start Date:    02/03/21                         Expected End Date:  08/06/21                     Follow Up Date 07/03/21   - call for medicine refill 2 or 3 days before it runs out - keep a list of all the medicines I take; vitamins and herbals too - use a pillbox to sort medicine    Why is this important?   These steps will help you keep on track with your medicines.   Notes:         This is a list of the screening recommended for you and due dates:  Health Maintenance  Topic Date Due   DTaP/Tdap/Td vaccine (1 - Tdap) Never done   COVID-19 Vaccine (3 - Moderna risk series) 02/22/2021   Screening for Lung Cancer  12/03/2022   Medicare Annual Wellness Visit  11/16/2023   Colon Cancer Screening  03/25/2028   Pneumonia Vaccine  Completed   Flu Shot  Completed   Hepatitis C Screening: USPSTF Recommendation to screen - Ages 18-79 yo.  Completed   Zoster (Shingles) Vaccine  Completed   HPV Vaccine  Aged Out          Objective:    There were no vitals filed for this visit. There is no height or weight on file to calculate  BMI.     10/22/2021   10:56 AM 03/25/2021    7:44 AM 10/20/2020    3:00 PM 10/22/2019    3:15 PM 10/01/2019    9:13 AM 05/13/2019    2:44 PM 11/18/2015   12:52 PM  Advanced Directives  Does Patient Have a Medical Advance Directive? No No No No No No No  Would patient like information on creating a medical advance directive? Yes (MAU/Ambulatory/Procedural Areas - Information given) No - Patient declined Yes (MAU/Ambulatory/Procedural Areas - Information given) No - Patient declined       Current Medications (verified) Outpatient Encounter Medications as of 11/15/2022  Medication Sig   amLODipine (NORVASC) 10 MG tablet TAKE 1 TABLET BY MOUTH ONCE  DAILY   atorvastatin (LIPITOR) 40 MG tablet TAKE 1 TABLET BY MOUTH ONCE  DAILY   azelastine (ASTELIN) 0.1 % nasal spray Place 2 sprays into both nostrils 2 (two) times daily. Use in each nostril as directed   gabapentin (NEURONTIN) 300 MG capsule TAKE 2 CAPSULES BY MOUTH AT  BEDTIME  terazosin (HYTRIN) 2 MG capsule TAKE 1 CAPSULE BY MOUTH AT  BEDTIME   vitamin B-12 (CYANOCOBALAMIN) 500 MCG tablet Take 500 mcg by mouth in the morning.   No facility-administered encounter medications on file as of 11/15/2022.    Allergies (verified) Motrin [ibuprofen]   History: Past Medical History:  Diagnosis Date   Chronic back pain    Duodenal ulcer 09/2012   GERD (gastroesophageal reflux disease)    Helicobacter pylori (H. pylori) infection 09/2012   Hypertension    Severe malnutrition ([place]) 10/26/2012   Squamous cell carcinoma Oct 2014   Esophogus   Squamous cell carcinoma of esophagus ([place]) 09/25/2012   Past Surgical History:  Procedure Laterality Date   COLONOSCOPY N/A 12/26/2013   Procedure: COLONOSCOPY;  Surgeon: Rogene Houston, MD;  [facility];  Service: Endoscopy;  Laterality: N/A;  830   COLONOSCOPY N/A 03/25/2021   Procedure: COLONOSCOPY;  Surgeon: Rogene Houston, MD;  [facility];  Service: Endoscopy;   Laterality: N/A;  AM   ESOPHAGOGASTRODUODENOSCOPY  09/21/2012   Procedure: ESOPHAGOGASTRODUODENOSCOPY (EGD);  Surgeon: Rogene Houston, MD;  [facility];  Service: Endoscopy;  Laterality: N/A;  345   PEG PLACEMENT  09/21/2012   Procedure: PERCUTANEOUS ENDOSCOPIC GASTROSTOMY (PEG) PLACEMENT;  Surgeon: Rogene Houston, MD;  [facility];  Service: Endoscopy;  Laterality: N/A;   PEG TUBE PLACEMENT  09/21/2012   PORTACATH PLACEMENT     REMOVAL OF GASTROSTOMY TUBE     august 2014   Family History  Problem Relation Age of Onset   Stroke Mother    Hypertension Father    Cancer Brother        Throat cancer   Kidney disease Daughter    Social History   Socioeconomic History   Marital status: Widowed    Spouse name: Not on file   Number of children: 4   Years of education: 10   Highest education level: Not on file  Occupational History   Not on file  Tobacco Use   Smoking status: Every Day    Packs/day: 1.00    Years: 50.00    Total pack years: 50.00    Types: Cigarettes   Smokeless tobacco: Never   Tobacco comments:    1/2 to 1 pack x50 yrs.  Substance and Sexual Activity   Alcohol use: Yes    Alcohol/week: 6.0 standard drinks of alcohol    Types: 6 Cans of beer per week    Comment: Patient states he drinks a couple of beers almost daily in the afternoon,   Drug use: No   Sexual activity: Not on file  Other Topics Concern   Not on file  Social History Narrative   Patient lives with friend has 4 children   Social Determinants of Health   Financial Resource Strain: Low Risk  (10/22/2021)   Overall Financial Resource Strain (CARDIA)    Difficulty of Paying Living Expenses: Not hard at all  Food Insecurity: No Food Insecurity (10/22/2021)   Hunger Vital Sign    Worried About Running Out of Food in the Last Year: Never true    Ran Out of Food in the Last Year: Never true  Transportation Needs: No Transportation Needs (10/22/2021)   PRAPARE -  Hydrologist (Medical): No    Lack of Transportation (Non-Medical): No  Physical Activity: Sufficiently Active (10/22/2021)   Exercise Vital Sign    Days of Exercise per  Week: 7 days    Minutes of Exercise per Session: 50 min  Stress: No Stress Concern Present (10/22/2021)   [place]    Feeling of Stress : Not at all  Social Connections: Moderately Isolated (10/22/2021)   Social Connection and Isolation Panel [NHANES]    Frequency of Communication with Friends and Family: More than three times a week    Frequency of Social Gatherings with Friends and Family: More than three times a week    Attends Religious Services: 1 to 4 times per year    Active Member of [employer] or Organizations: No    Attends Archivist Meetings: Never    Marital Status: Widowed    Tobacco Counseling Ready to quit: Not Answered Counseling given: Not Answered Tobacco comments: 1/2 to 1 pack x50 yrs.   Clinical Intake:                 Diabetic? No         Activities of Daily Living     No data to display           Patient Care Team: Lindell Spar, MD as PCP - General (Internal Medicine) Gatha Mayer, MD as Consulting Physician (Radiation Oncology) Everardo All, MD as Consulting Physician (Hematology and Oncology) Rogene Houston, MD as Consulting Physician (Gastroenterology) Edythe Clarity, [facility] as Pharmacist (Pharmacist)  Indicate any recent Medical Services you may have received from other than [facility] providers in the past year (date may be approximate).     Assessment:   This is a routine wellness examination for [place].  Hearing/Vision screen No results found.  Dietary issues and exercise activities discussed:     Goals Addressed   None   Depression Screen    10/31/2022    3:43 PM 04/25/2022    3:46 PM 10/26/2021    3:51 PM 10/22/2021   10:57 AM  10/22/2021   10:54 AM 07/28/2021    3:34 PM 03/01/2021    2:01 PM  PHQ 2/9 Scores  PHQ - 2 Score 0 0 0 0 0 0 0    Fall Risk    10/31/2022    3:43 PM 04/25/2022    3:46 PM 10/26/2021    3:51 PM 10/22/2021   10:57 AM 07/28/2021    3:34 PM  [place] in the past year? 0 0 0 0 0  Number falls in past yr: 0 0 0 0 0  Injury with Fall? 0 0 0 0 0  Risk for fall due to : No Fall Risks No Fall Risks No Fall Risks No Fall Risks No Fall Risks  Follow up Falls evaluation completed Falls evaluation completed Falls evaluation completed Falls evaluation completed Falls evaluation completed    FALL RISK PREVENTION PERTAINING TO THE HOME:  Any stairs in or around the home? No  If so, are there any without handrails?  NA Home free of loose throw rugs in walkways, pet beds, electrical cords, etc? Yes  Adequate lighting in your home to reduce risk of falls? Yes   ASSISTIVE DEVICES UTILIZED TO PREVENT FALLS:  Life alert? No  Use of a cane, walker or w/c? No  Grab bars in the bathroom? Yes  Shower chair or bench in shower? No  Elevated toilet seat or a handicapped toilet? Yes     Cognitive Function:    10/22/2021   10:58 AM  MMSE - Mini Mental State  Exam  Not completed: Unable to complete        10/22/2021   10:58 AM  6CIT Screen  What Year? 0 points  What month? 0 points  What time? 0 points  Count back from 20 0 points  Months in reverse 2 points  Repeat phrase 0 points  Total Score 2 points    Immunizations Immunization History  Administered Date(s) Administered   Fluad Quad(high Dose 65+) 10/20/2020, 10/27/2021, 10/31/2022   Influenza, High Dose Seasonal PF 09/19/2016, 09/26/2018, 09/11/2019   Influenza,inj,Quad PF,6+ Mos 11/12/2013, 09/09/2014, 11/18/2015, 09/07/2017   Influenza-Unspecified 08/12/2019   Moderna SARS-COV2 Booster Vaccination 01/25/2021   Moderna Sars-Covid-2 Vaccination 04/07/2020, 05/15/2020   Pneumococcal Conjugate-13 11/12/2013    Pneumococcal Polysaccharide-23 10/26/2012, 10/20/2020   Zoster Recombinat (Shingrix) 01/14/2020, 03/16/2020    TDAP status: Due, Education has been provided regarding the importance of this vaccine. Advised may receive this vaccine at local pharmacy or Health Dept. Aware to provide a copy of the vaccination record if obtained from local pharmacy or Health Dept. Verbalized acceptance and understanding.  Flu Vaccine status: Up to date  Pneumococcal vaccine status: Up to date  Covid-19 vaccine status: Completed vaccines  Qualifies for Shingles Vaccine? Yes   Zostavax completed Yes   Shingrix Completed?: Yes  Screening Tests Health Maintenance  Topic Date Due   DTaP/Tdap/Td (1 - Tdap) Never done   COVID-19 Vaccine (3 - Moderna risk series) 02/22/2021   Lung Cancer Screening  12/03/2022   Medicare Annual Wellness (AWV)  11/16/2023   COLONOSCOPY (Pts 45-41yr Insurance coverage will need to be confirmed)  03/25/2028   Pneumonia Vaccine 76 Years old  Completed   INFLUENZA VACCINE  Completed   Hepatitis C Screening  Completed   Zoster Vaccines- Shingrix  Completed   HPV VACCINES  Aged Out    Health Maintenance  Health Maintenance Due  Topic Date Due   DTaP/Tdap/Td (1 - Tdap) Never done   COVID-19 Vaccine (3 - Moderna risk series) 02/22/2021   Lung Cancer Screening  12/03/2022    Colorectal cancer screening: Type of screening: Colonoscopy. Completed 03/25/2021. Repeat every 7 years  Lung Cancer Screening: (Low Dose CT Chest recommended if Age 74-80years, 30 pack-year currently smoking OR have quit w/in 15years.) does qualify.   Lung Cancer Screening Referral: Due 12/03/2022  Additional Screening:  Hepatitis C Screening: does qualify; Completed 05/13/2019  Vision Screening: Recommended annual ophthalmology exams for early detection of glaucoma and other disorders of the eye. Is the patient up to date with their annual eye exam?  No  Who is the provider or what is the name  of the office in which the patient attends annual eye exams? NA If pt is not established with a provider, would they like to be referred to a provider to establish care? Yes .   Dental Screening: Recommended annual dental exams for proper oral hygiene  Community Resource Referral / Chronic Care Management: CRR required this visit?  No   CCM required this visit?  No      Plan:     I have personally reviewed and noted the following in the patient's chart:   Medical and social history Use of alcohol, tobacco or illicit drugs  Current medications and supplements including opioid prescriptions. Patient is not currently taking opioid prescriptions. Functional ability and status Nutritional status Physical activity Advanced directives List of other physicians Hospitalizations, surgeries, and ER visits in previous 12 months Vitals Screenings to include cognitive, depression, and falls Referrals  and appointments  In addition, I have reviewed and discussed with patient certain preventive protocols, quality metrics, and best practice recommendations. A written personalized care plan for preventive services as well as general preventive health recommendations were provided to patient.     Johny Drilling, [place]   11/15/2022   Nurse Notes:  Mr. Troy , Thank you for taking time to come for your Medicare Wellness Visit. I appreciate your ongoing commitment to your health goals. Please review the following plan we discussed and let me know if I can assist you in the future.   These are the goals we discussed:  Goals      Manage My Medicine     Timeframe:  Long-Range Goal Priority:  Medium Start Date:    02/03/21                         Expected End Date:  08/06/21                     Follow Up Date 07/03/21   - call for medicine refill 2 or 3 days before it runs out - keep a list of all the medicines I take; vitamins and herbals too - use a pillbox to sort medicine    Why is  this important?   These steps will help you keep on track with your medicines.   Notes:         This is a list of the screening recommended for you and due dates:  Health Maintenance  Topic Date Due   DTaP/Tdap/Td vaccine (1 - Tdap) Never done   COVID-19 Vaccine (3 - Moderna risk series) 02/22/2021   Screening for Lung Cancer  12/03/2022   Medicare Annual Wellness Visit  11/16/2023   Colon Cancer Screening  03/25/2028   Pneumonia Vaccine  Completed   Flu Shot  Completed   Hepatitis C Screening: USPSTF Recommendation to screen - Ages 18-79 yo.  Completed   Zoster (Shingles) Vaccine  Completed   HPV Vaccine  Aged Out

## 2022-11-30 ENCOUNTER — Encounter: Payer: Self-pay | Admitting: Urology

## 2023-01-02 NOTE — Progress Notes (Signed)
H&P  Chief Complaint: Elevated PSA  History of Present Illness: 77 yo male sent by Dr Posey Pronto for PSA elevation. His PSA data is as follows:  06/2014--1.23 07/2015--1.67 10/2018--1.4 05/2019--1.8 06/2020--1.4 10/2021--2.9 10/2022--5.6  There is no family history of prostate cancer.  He is on a small dose of terazosin at night but otherwise has no real urinary concerns.  Past Medical History:  Diagnosis Date   Chronic back pain    Duodenal ulcer 09/2012   GERD (gastroesophageal reflux disease)    Helicobacter pylori (H. pylori) infection 09/2012   Hypertension    Severe malnutrition ([place]) 10/26/2012   Squamous cell carcinoma Oct 2014   Esophogus   Squamous cell carcinoma of esophagus ([place]) 09/25/2012    Past Surgical History:  Procedure Laterality Date   COLONOSCOPY N/A 12/26/2013   Procedure: COLONOSCOPY;  Surgeon: Rogene Houston, MD;  [facility];  Service: Endoscopy;  Laterality: N/A;  830   COLONOSCOPY N/A 03/25/2021   Procedure: COLONOSCOPY;  Surgeon: Rogene Houston, MD;  [facility];  Service: Endoscopy;  Laterality: N/A;  AM   ESOPHAGOGASTRODUODENOSCOPY  09/21/2012   Procedure: ESOPHAGOGASTRODUODENOSCOPY (EGD);  Surgeon: Rogene Houston, MD;  [facility];  Service: Endoscopy;  Laterality: N/A;  345   PEG PLACEMENT  09/21/2012   Procedure: PERCUTANEOUS ENDOSCOPIC GASTROSTOMY (PEG) PLACEMENT;  Surgeon: Rogene Houston, MD;  [facility];  Service: Endoscopy;  Laterality: N/A;   PEG TUBE PLACEMENT  09/21/2012   PORTACATH PLACEMENT     REMOVAL OF GASTROSTOMY TUBE     august 2014    Home Medications:  Allergies as of 01/03/2023       Reactions   Motrin [ibuprofen] Swelling        Medication List        Accurate as of January 02, 2023  5:39 PM. If you have any questions, ask your nurse or doctor.          amLODipine 10 MG tablet Commonly known as: NORVASC TAKE 1 TABLET BY MOUTH ONCE  DAILY   atorvastatin 40  MG tablet Commonly known as: LIPITOR TAKE 1 TABLET BY MOUTH ONCE  DAILY   azelastine 0.1 % nasal spray Commonly known as: ASTELIN Place 2 sprays into both nostrils 2 (two) times daily. Use in each nostril as directed   cyanocobalamin 500 MCG tablet Commonly known as: VITAMIN B12 Take 500 mcg by mouth in the morning.   gabapentin 300 MG capsule Commonly known as: NEURONTIN TAKE 2 CAPSULES BY MOUTH AT  BEDTIME   terazosin 2 MG capsule Commonly known as: HYTRIN TAKE 1 CAPSULE BY MOUTH AT  BEDTIME        Allergies:  Allergies  Allergen Reactions   Motrin [Ibuprofen] Swelling    Family History  Problem Relation Age of Onset   Stroke Mother    Hypertension Father    Cancer Brother        Throat cancer   Kidney disease Daughter     Social History:  reports that he has been smoking cigarettes. He has a 50.00 pack-year smoking history. He has never used smokeless tobacco. He reports current alcohol use of about 6.0 standard drinks of alcohol per week. He reports that he does not use drugs.  ROS: A complete review of systems was performed.  All systems are negative except for pertinent findings as noted.  Physical Exam:  Vital signs in last 24 hours: There were no vitals taken for  this visit. Constitutional:  Alert and oriented, No acute distress Cardiovascular: Regular rate  Respiratory: Normal respiratory effort GI: Abdomen is soft, nontender, nondistended, no abdominal masses.  There are no inguinal hernias. Genitourinary:  penis uncircumcised.  Normal anal sphincter tone.  Prostate 30 g, symmetric, nonnodular, nontender. Lymphatic: No lymphadenopathy Neurologic: Grossly intact, no focal deficits Psychiatric: Normal mood and affect  I have reviewed prior pt notes  I have reviewed notes from referring/previous physicians  I have reviewed urinalysis results  I have reviewed prior PSA results    Impression/Assessment:  Elevated PSA, benign exam  Plan:  I  will recheck his PSA today  If still elevated, proceed with ultrasound and biopsy.  If back to normal I will still put him on surveillance

## 2023-01-03 ENCOUNTER — Ambulatory Visit: Payer: Medicare Other | Admitting: Urology

## 2023-01-03 ENCOUNTER — Encounter: Payer: Self-pay | Admitting: Urology

## 2023-01-03 VITALS — BP 150/81 | HR 74

## 2023-01-03 DIAGNOSIS — R972 Elevated prostate specific antigen [PSA]: Secondary | ICD-10-CM | POA: Diagnosis not present

## 2023-01-03 LAB — URINALYSIS, ROUTINE W REFLEX MICROSCOPIC
Bilirubin, UA: NEGATIVE
Glucose, UA: NEGATIVE
Ketones, UA: NEGATIVE
Nitrite, UA: NEGATIVE
RBC, UA: NEGATIVE
Specific Gravity, UA: 1.01 (ref 1.005–1.030)
Urobilinogen, Ur: 0.2 mg/dL (ref 0.2–1.0)
pH, UA: 5 (ref 5.0–7.5)

## 2023-01-03 LAB — MICROSCOPIC EXAMINATION: Bacteria, UA: NONE SEEN

## 2023-01-04 LAB — PSA: Prostate Specific Ag, Serum: 3.6 ng/mL (ref 0.0–4.0)

## 2023-01-06 ENCOUNTER — Telehealth: Payer: Self-pay

## 2023-01-06 NOTE — Telephone Encounter (Signed)
Patient is aware his PSA down to 3.6. HE does not need a prostate Bx--just followup here in 6 mos w/ PSA. I see that he has an appt here later this month. He does not need that. Patient voiced understanding

## 2023-01-06 NOTE — Telephone Encounter (Signed)
-----   Message from Franchot Gallo, MD sent at 01/05/2023  5:39 PM EST ----- Please call pt--good news PSA down to 3.6. HE does not need a prostate Bx--just followup here in 6 mos w/ PSA. I see that he has an appt here later this month. He does not need that. ----- Message ----- From: Sherrilyn Rist, CMA Sent: 01/04/2023  10:29 AM EST To: Franchot Gallo, MD  Please review

## 2023-01-10 ENCOUNTER — Ambulatory Visit: Payer: Medicare Other | Admitting: Urology

## 2023-03-03 ENCOUNTER — Ambulatory Visit (INDEPENDENT_AMBULATORY_CARE_PROVIDER_SITE_OTHER): Payer: Medicare Other | Admitting: Internal Medicine

## 2023-03-03 ENCOUNTER — Encounter: Payer: Self-pay | Admitting: Internal Medicine

## 2023-03-03 VITALS — BP 142/78 | HR 78 | Ht 67.0 in | Wt 159.6 lb

## 2023-03-03 DIAGNOSIS — Z720 Tobacco use: Secondary | ICD-10-CM

## 2023-03-03 DIAGNOSIS — B0229 Other postherpetic nervous system involvement: Secondary | ICD-10-CM | POA: Diagnosis not present

## 2023-03-03 DIAGNOSIS — N401 Enlarged prostate with lower urinary tract symptoms: Secondary | ICD-10-CM | POA: Diagnosis not present

## 2023-03-03 DIAGNOSIS — I700 Atherosclerosis of aorta: Secondary | ICD-10-CM | POA: Diagnosis not present

## 2023-03-03 DIAGNOSIS — E782 Mixed hyperlipidemia: Secondary | ICD-10-CM | POA: Diagnosis not present

## 2023-03-03 DIAGNOSIS — F17210 Nicotine dependence, cigarettes, uncomplicated: Secondary | ICD-10-CM

## 2023-03-03 DIAGNOSIS — J432 Centrilobular emphysema: Secondary | ICD-10-CM | POA: Diagnosis not present

## 2023-03-03 DIAGNOSIS — R351 Nocturia: Secondary | ICD-10-CM

## 2023-03-03 DIAGNOSIS — R7989 Other specified abnormal findings of blood chemistry: Secondary | ICD-10-CM

## 2023-03-03 DIAGNOSIS — I10 Essential (primary) hypertension: Secondary | ICD-10-CM | POA: Diagnosis not present

## 2023-03-03 DIAGNOSIS — N1831 Chronic kidney disease, stage 3a: Secondary | ICD-10-CM | POA: Diagnosis not present

## 2023-03-03 DIAGNOSIS — Z122 Encounter for screening for malignant neoplasm of respiratory organs: Secondary | ICD-10-CM

## 2023-03-03 MED ORDER — LOSARTAN POTASSIUM 25 MG PO TABS: 25.0000 mg | ORAL_TABLET | Freq: Every day | ORAL | 1 refills | Status: DC

## 2023-03-03 MED ORDER — ALBUTEROL SULFATE HFA 108 (90 BASE) MCG/ACT IN AERS: 2.0000 | INHALATION_SPRAY | Freq: Four times a day (QID) | RESPIRATORY_TRACT | 2 refills | Status: DC | PRN

## 2023-03-03 NOTE — Assessment & Plan Note (Signed)
Denies any recent change in appetite or weight, chronic fatigue, tremors or palpitations Check TSH and free T4

## 2023-03-03 NOTE — Assessment & Plan Note (Signed)
On Terazosin 

## 2023-03-03 NOTE — Assessment & Plan Note (Addendum)
BP Readings from Last 1 Encounters:  03/03/23 (!) 148/87   Uncontrolled with Amlodipine Added losartan 25 mg QD Check BMP after 2 weeks Plan to increase dose of losartan for proteinuria Counseled for compliance with the medications Advised DASH diet and moderate exercise/walking, at least 150 mins/week

## 2023-03-03 NOTE — Assessment & Plan Note (Addendum)
Noted noted on CT chest On statin

## 2023-03-03 NOTE — Assessment & Plan Note (Signed)
Check CMP Avoid nephrotoxic agents Has proteinuria - started losartan

## 2023-03-03 NOTE — Assessment & Plan Note (Signed)
On Gabapentin 

## 2023-03-03 NOTE — Assessment & Plan Note (Addendum)
Has mild dyspnea upon exertion Prescribed albuterol inhaler as needed for dyspnea and wheezing Needs to quit smoking

## 2023-03-03 NOTE — Assessment & Plan Note (Addendum)
On Lipitor Checked lipid profile 

## 2023-03-03 NOTE — Patient Instructions (Signed)
Please start taking Losartan as prescribed.  Please get blood tests done after 2 weeks.  Please continue to take medications as prescribed.  Please continue to follow low salt diet and perform moderate exercise/walking at least 150 mins/week.

## 2023-03-03 NOTE — Progress Notes (Signed)
Established Patient Office Visit  Subjective:  Patient ID: Eddie [name], male    DOB: [birth year]  Age: 77 y.o. MRN: [id number]  CC:  Chief Complaint  Patient presents with   Hypertension    Four month follow up for hypertension and CKD    HPI Eddie [name] is a 77 y.o. male with past medical history of HTN, esophageal ca. s/p chemoradiation, COPD, postherpetic neuralgia, CKD and colonic polyps who presents for f/u of his chronic medical conditions.  BP is elevated today. Takes medications regularly. Patient denies headache, dizziness, chest pain, or palpitations.   He has CKD stage 3a.  His GFR had improved slightly compared to prior in the last visit.  Denies any dysuria, hematuria, urinary resistance or hesitance. Does report nocturia.  He had proteinuria on last urine test.  He has cut down smoking to half pack per day now. He was not able to tolerate Chantix due to nightmares.  He complains of mild dyspnea upon exertion.  Denies any fever, chills or wheezing currently.  He uses Flonase for allergic rhinitis.    Past Medical History:  Diagnosis Date   Chronic back pain    Duodenal ulcer 09/2012   GERD (gastroesophageal reflux disease)    Helicobacter pylori (H. pylori) infection 09/2012   Hypertension    Severe malnutrition ([place]) 10/26/2012   Squamous cell carcinoma Oct 2014   Esophogus   Squamous cell carcinoma of esophagus ([place]) 09/25/2012    Past Surgical History:  Procedure Laterality Date   COLONOSCOPY N/A 12/26/2013   Procedure: COLONOSCOPY;  Surgeon: Rogene Houston, MD;  [facility];  Service: Endoscopy;  Laterality: N/A;  830   COLONOSCOPY N/A 03/25/2021   Procedure: COLONOSCOPY;  Surgeon: Rogene [place], MD;  [facility];  Service: Endoscopy;  Laterality: N/A;  AM   ESOPHAGOGASTRODUODENOSCOPY  09/21/2012   Procedure: ESOPHAGOGASTRODUODENOSCOPY (EGD);  Surgeon: Rogene [place], MD;  [facility];  Service: Endoscopy;   Laterality: N/A;  345   PEG PLACEMENT  09/21/2012   Procedure: PERCUTANEOUS ENDOSCOPIC GASTROSTOMY (PEG) PLACEMENT;  Surgeon: Rogene [place], MD;  [facility];  Service: Endoscopy;  Laterality: N/A;   PEG TUBE PLACEMENT  09/21/2012   PORTACATH PLACEMENT     REMOVAL OF GASTROSTOMY TUBE     august 2014    Family History  Problem Relation Age of Onset   Stroke Mother    Hypertension Father    Cancer Brother        Throat cancer   Kidney disease Daughter     Social History   Socioeconomic History   Marital status: Widowed    Spouse name: Not on file   Number of children: 4   Years of education: 10   Highest education level: Not on file  Occupational History   Not on file  Tobacco Use   Smoking status: Every Day    Packs/day: 1.00    Years: 50.00    Additional pack years: 0.00    Total pack years: 50.00    Types: Cigarettes   Smokeless tobacco: Never   Tobacco comments:    1/2 to 1 pack x50 yrs.  Substance and Sexual Activity   Alcohol use: Yes    Alcohol/week: 6.0 standard drinks of alcohol    Types: 6 Cans of beer per week    Comment: Patient states he drinks a couple of beers almost daily in the afternoon,   Drug use: No  Sexual activity: Not on file  Other Topics Concern   Not on file  Social History Narrative   Patient lives with friend has 4 children   Social Determinants of Health   Financial Resource Strain: Low Risk  (11/15/2022)   Overall Financial Resource Strain (CARDIA)    Difficulty of Paying Living Expenses: Not hard at all  Food Insecurity: No Food Insecurity (11/15/2022)   Hunger Vital Sign    Worried About Running Out of Food in the Last Year: Never true    Ran Out of Food in the Last Year: Never true  Transportation Needs: No Transportation Needs (11/15/2022)   PRAPARE - Hydrologist (Medical): No    Lack of Transportation (Non-Medical): No  Physical Activity: Sufficiently Active (11/15/2022)    Exercise Vital Sign    Days of Exercise per Week: 7 days    Minutes of Exercise per Session: 50 min  Stress: No Stress Concern Present (11/15/2022)   [place]    Feeling of Stress : Not at all  Social Connections: Socially Isolated (11/15/2022)   Social Connection and Isolation Panel [NHANES]    Frequency of Communication with Friends and Family: Three times a week    Frequency of Social Gatherings with Friends and Family: Three times a week    Attends Religious Services: Never    Active Member of Clubs or Organizations: No    Attends Archivist Meetings: Never    Marital Status: Widowed  Intimate Partner Violence: Not At Risk (11/15/2022)   Humiliation, Afraid, Rape, and Kick questionnaire    Fear of Current or Ex-Partner: No    Emotionally Abused: No    Physically Abused: No    Sexually Abused: No    Outpatient Medications Prior to Visit  Medication Sig Dispense Refill   amLODipine (NORVASC) 10 MG tablet TAKE 1 TABLET BY MOUTH ONCE  DAILY 100 tablet 2   atorvastatin (LIPITOR) 40 MG tablet TAKE 1 TABLET BY MOUTH ONCE  DAILY 100 tablet 2   azelastine (ASTELIN) 0.1 % nasal spray Place 2 sprays into both nostrils 2 (two) times daily. Use in each nostril as directed 30 mL 12   gabapentin (NEURONTIN) 300 MG capsule TAKE 2 CAPSULES BY MOUTH AT  BEDTIME 200 capsule 2   terazosin (HYTRIN) 2 MG capsule TAKE 1 CAPSULE BY MOUTH AT  BEDTIME 100 capsule 2   vitamin B-12 (CYANOCOBALAMIN) 500 MCG tablet Take 500 mcg by mouth in the morning.     No facility-administered medications prior to visit.    Allergies  Allergen Reactions   Motrin [Ibuprofen] Swelling    ROS Review of Systems  Constitutional:  Negative for chills and fever.  HENT:  Positive for congestion. Negative for postnasal drip and sore throat.   Eyes:  Negative for pain and discharge.  Respiratory:  Positive for shortness of breath. Negative for  cough.   Cardiovascular:  Negative for chest pain and palpitations.  Gastrointestinal:  Negative for diarrhea, nausea and vomiting.  Endocrine: Negative for polydipsia and polyuria.  Genitourinary:  Negative for dysuria and hematuria.  Musculoskeletal:  Negative for neck pain and neck stiffness.  Skin:  Negative for rash.  Neurological:  Negative for dizziness, weakness, numbness and headaches.  Psychiatric/Behavioral:  Negative for agitation and behavioral problems.       Objective:    Physical Exam Vitals reviewed.  Constitutional:      General: He is not  in acute distress.    Appearance: He is not diaphoretic.  HENT:     Head: Normocephalic and atraumatic.     Nose: Congestion present.     Mouth/Throat:     Mouth: Mucous membranes are moist.  Eyes:     General: No scleral icterus.    Extraocular Movements: Extraocular movements intact.  Cardiovascular:     Rate and Rhythm: Normal rate and regular rhythm.     Pulses: Normal pulses.     Heart sounds: Normal heart sounds. No murmur heard. Pulmonary:     Breath sounds: Normal breath sounds. No wheezing or rales.  Musculoskeletal:     Cervical back: Neck supple. No tenderness.     Right lower leg: No edema.     Left lower leg: No edema.  Skin:    General: Skin is warm.     Findings: No rash.  Neurological:     General: No focal deficit present.     Mental Status: He is alert and oriented to person, place, and time.     Sensory: No sensory deficit.     Motor: No weakness.  Psychiatric:        Mood and Affect: Mood normal.        Behavior: Behavior normal.     BP (!) 142/78 (BP Location: Left Arm, Cuff Size: Normal)   Pulse 78   Ht 5\' 7"  (1.702 m)   Wt 159 lb 9.6 oz (72.4 kg)   SpO2 94%   BMI 25.00 kg/m  Wt Readings from Last 3 Encounters:  03/03/23 159 lb 9.6 oz (72.4 kg)  10/31/22 152 lb 3.2 oz (69 kg)  04/25/22 148 lb 3.2 oz (67.2 kg)    Lab Results  Component Value Date   TSH 4.520 (H) 10/31/2022    Lab Results  Component Value Date   WBC 12.6 (H) 10/31/2022   HGB 16.1 10/31/2022   HCT 48.2 10/31/2022   MCV 96 10/31/2022   PLT 226 10/31/2022   Lab Results  Component Value Date   NA 137 10/31/2022   K 4.5 10/31/2022   CO2 20 10/31/2022   GLUCOSE 94 10/31/2022   BUN 12 10/31/2022   CREATININE 1.61 (H) 10/31/2022   BILITOT 0.8 10/31/2022   ALKPHOS 113 10/31/2022   AST 24 10/31/2022   ALT 17 10/31/2022   PROT 7.1 10/31/2022   ALBUMIN 4.6 10/31/2022   CALCIUM 9.7 10/31/2022   EGFR 45 (L) 10/31/2022   Lab Results  Component Value Date   CHOL 161 10/31/2022   Lab Results  Component Value Date   HDL 79 10/31/2022   Lab Results  Component Value Date   LDLCALC 62 10/31/2022   Lab Results  Component Value Date   TRIG 112 10/31/2022   Lab Results  Component Value Date   CHOLHDL 2.0 10/31/2022   Lab Results  Component Value Date   HGBA1C 4.6 (L) 10/31/2022      Assessment & Plan:   Problem List Items Addressed This Visit       Cardiovascular and Mediastinum   Essential hypertension, benign - Primary    BP Readings from Last 1 Encounters:  03/03/23 (!) 148/87  Uncontrolled with Amlodipine Added losartan 25 mg QD Check BMP after 2 weeks Plan to increase dose of losartan for proteinuria Counseled for compliance with the medications Advised DASH diet and moderate exercise/walking, at least 150 mins/week      Relevant Medications   losartan (COZAAR) 25 MG tablet  Aortic atherosclerosis (HCC)    Noted noted on CT chest On statin      Relevant Medications   losartan (COZAAR) 25 MG tablet     Respiratory   Pulmonary emphysema (HCC)    Has mild dyspnea upon exertion Prescribed albuterol inhaler as needed for dyspnea and wheezing Needs to quit smoking      Relevant Medications   albuterol (VENTOLIN HFA) 108 (90 Base) MCG/ACT inhaler     Nervous and Auditory   Post herpetic neuralgia    On Gabapentin        Genitourinary   CKD (chronic  kidney disease) stage 3, GFR 30-59 ml/min (HCC)    Check CMP Avoid nephrotoxic agents Has proteinuria - started losartan      Relevant Orders   Basic Metabolic Panel (BMET)   Phosphorus   Benign prostatic hyperplasia with lower urinary tract symptoms    On Terazosin        Other   Tobacco abuse    Smokes about 0.5 pack/day now, cut down from 1 pack/day  Asked about quitting: confirms that he currently smokes cigarettes Advise to quit smoking: Educated about QUITTING to reduce the risk of cancer, cardio and cerebrovascular disease. Assess willingness: Unwilling to quit at this time, but is working on cutting back. Assist with counseling and pharmacotherapy: Counseled for 5 minutes and literature provided. Did not tolerate Chantix. Arrange for follow up: Follow up in 3 months and continue to offer help.      Mixed hyperlipidemia    On Lipitor Checked lipid profile      Relevant Medications   losartan (COZAAR) 25 MG tablet   Elevated TSH    Denies any recent change in appetite or weight, chronic fatigue, tremors or palpitations Check TSH and free T4      Relevant Orders   TSH + free T4   Other Visit Diagnoses     Screening for lung cancer       Relevant Orders   CT CHEST LUNG CANCER SCREENING LOW DOSE WO CONTRAST       Meds ordered this encounter  Medications   losartan (COZAAR) 25 MG tablet    Sig: Take 1 tablet (25 mg total) by mouth daily.    Dispense:  90 tablet    Refill:  1   albuterol (VENTOLIN HFA) 108 (90 Base) MCG/ACT inhaler    Sig: Inhale 2 puffs into the lungs every 6 (six) hours as needed for wheezing or shortness of breath.    Dispense:  18 g    Refill:  2    Okay to substitute to generic/formulary Albuterol.    Follow-up: Return in about 4 months (around 07/03/2023) for HTN and CKD.    Lindell Spar, MD

## 2023-03-03 NOTE — Assessment & Plan Note (Addendum)
Smokes about 0.5 pack/day now, cut down from 1 pack/day  Asked about quitting: confirms that he currently smokes cigarettes Advise to quit smoking: Educated about QUITTING to reduce the risk of cancer, cardio and cerebrovascular disease. Assess willingness: Unwilling to quit at this time, but is working on cutting back. Assist with counseling and pharmacotherapy: Counseled for 5 minutes and literature provided. Did not tolerate Chantix. Arrange for follow up: Follow up in 3 months and continue to offer help.

## 2023-03-16 DIAGNOSIS — I10 Essential (primary) hypertension: Secondary | ICD-10-CM | POA: Diagnosis not present

## 2023-03-16 DIAGNOSIS — E039 Hypothyroidism, unspecified: Secondary | ICD-10-CM | POA: Diagnosis not present

## 2023-03-16 DIAGNOSIS — R7989 Other specified abnormal findings of blood chemistry: Secondary | ICD-10-CM | POA: Diagnosis not present

## 2023-03-16 DIAGNOSIS — N1831 Chronic kidney disease, stage 3a: Secondary | ICD-10-CM | POA: Diagnosis not present

## 2023-03-17 LAB — BASIC METABOLIC PANEL
BUN/Creatinine Ratio: 9 — ABNORMAL LOW (ref 10–24)
BUN: 16 mg/dL (ref 8–27)
CO2: 17 mmol/L — ABNORMAL LOW (ref 20–29)
Calcium: 9.8 mg/dL (ref 8.6–10.2)
Chloride: 96 mmol/L (ref 96–106)
Creatinine, Ser: 1.82 mg/dL — ABNORMAL HIGH (ref 0.76–1.27)
Glucose: 91 mg/dL (ref 70–99)
Potassium: 4.3 mmol/L (ref 3.5–5.2)
Sodium: 133 mmol/L — ABNORMAL LOW (ref 134–144)
eGFR: 38 mL/min/{1.73_m2} — ABNORMAL LOW (ref >59)

## 2023-03-17 LAB — TSH+FREE T4
Free T4: 1.08 ng/dL (ref 0.82–1.77)
TSH: 3.7 u[IU]/mL (ref 0.450–4.500)

## 2023-03-17 LAB — PHOSPHORUS: Phosphorus: 3.1 mg/dL (ref 2.8–4.1)

## 2023-03-30 ENCOUNTER — Other Ambulatory Visit: Payer: Self-pay | Admitting: Internal Medicine

## 2023-03-30 DIAGNOSIS — I6523 Occlusion and stenosis of bilateral carotid arteries: Secondary | ICD-10-CM

## 2023-03-30 DIAGNOSIS — I10 Essential (primary) hypertension: Secondary | ICD-10-CM

## 2023-03-30 DIAGNOSIS — N400 Enlarged prostate without lower urinary tract symptoms: Secondary | ICD-10-CM

## 2023-03-30 DIAGNOSIS — B0229 Other postherpetic nervous system involvement: Secondary | ICD-10-CM

## 2023-04-03 ENCOUNTER — Encounter (INDEPENDENT_AMBULATORY_CARE_PROVIDER_SITE_OTHER): Payer: Self-pay | Admitting: Gastroenterology

## 2023-04-03 ENCOUNTER — Ambulatory Visit (INDEPENDENT_AMBULATORY_CARE_PROVIDER_SITE_OTHER): Payer: Medicare Other | Admitting: Gastroenterology

## 2023-04-03 VITALS — BP 137/77 | HR 69 | Temp 98.1°F | Ht 67.0 in | Wt 161.3 lb

## 2023-04-03 DIAGNOSIS — Z8501 Personal history of malignant neoplasm of esophagus: Secondary | ICD-10-CM | POA: Diagnosis not present

## 2023-04-03 NOTE — Patient Instructions (Signed)
I'm glad you are doing well As discussed would recommend surveillance upper endoscopy as last was over 10 years ago, if you decide you would like to proceed with this, please make me aware  Follow up 1 year

## 2023-04-03 NOTE — Progress Notes (Signed)
Referring Provider: Anabel Halon, MD Primary Care Physician:  Anabel Halon, MD Primary GI Physician: Levon Hedger   Chief Complaint  Patient presents with   history of esophageal cancer    Follow up on history of esophageal cancer. Reports doing well and no concerns today.    HPI:   Eddie [name] is a 77 y.o. male with past medical history of esophageal squamous cell carcinoma (2013), s/p chemoradiation,   Patient presenting today for follow up of history of esophageal cancer  History of esophageal squamous cell carcinoma, diagnosed  in September 2013 when he required PEG followed by chemoradiation.  PEG was subsequently removed.  has done well without dysphagia or GERD since.   Last seen April 2023, at that time doing well, no GI complaints, no red flag symptoms.   Present:   He is doing well. He has no GI complaints today. Appetite is good. He reports he has some weight gain. No red flag symptoms. Patient denies melena, hematochezia, nausea, vomiting, diarrhea, constipation, dysphagia, odyonophagia, early satiety or weight loss.   Last Colonoscopy:03/25/21- Diverticulosis in the sigmoid colon. - External hemorrhoids. - No specimens collected. Last Endoscopy:2013   Recommendations:  Repeat colonoscopy April 2029   Past Medical History:  Diagnosis Date   Chronic back pain    Duodenal ulcer 09/2012   GERD (gastroesophageal reflux disease)    Helicobacter pylori (H. pylori) infection 09/2012   Hypertension    Severe malnutrition (HCC) 10/26/2012   Squamous cell carcinoma Oct 2014   Esophogus   Squamous cell carcinoma of esophagus ([facility]CC) 09/25/2012    Past Surgical History:  Procedure Laterality Date   COLONOSCOPY N/A 12/26/2013   Procedure: COLONOSCOPY;  Surgeon: Malissa Hippo, MD;  [facility];  Service: Endoscopy;  Laterality: N/A;  830   COLONOSCOPY N/A 03/25/2021   Procedure: COLONOSCOPY;  Surgeon: Malissa Hippo, MD;  [facility];   Service: Endoscopy;  Laterality: N/A;  AM   ESOPHAGOGASTRODUODENOSCOPY  09/21/2012   Procedure: ESOPHAGOGASTRODUODENOSCOPY (EGD);  Surgeon: Malissa Hippo, MD;  [facility];  Service: Endoscopy;  Laterality: N/A;  345   PEG PLACEMENT  09/21/2012   Procedure: PERCUTANEOUS ENDOSCOPIC GASTROSTOMY (PEG) PLACEMENT;  Surgeon: Malissa Hippo, MD;  [facility];  Service: Endoscopy;  Laterality: N/A;   PEG TUBE PLACEMENT  09/21/2012   PORTACATH PLACEMENT     REMOVAL OF GASTROSTOMY TUBE     august 2014    Current Outpatient Medications  Medication Sig Dispense Refill   albuterol (VENTOLIN HFA) 108 (90 Base) MCG/ACT inhaler Inhale 2 puffs into the lungs every 6 (six) hours as needed for wheezing or shortness of breath. 18 g 2   amLODipine (NORVASC) 10 MG tablet TAKE 1 TABLET BY MOUTH ONCE  DAILY 100 tablet 2   atorvastatin (LIPITOR) 40 MG tablet TAKE 1 TABLET BY MOUTH ONCE  DAILY 100 tablet 2   azelastine (ASTELIN) 0.1 % nasal spray Place 2 sprays into both nostrils 2 (two) times daily. Use in each nostril as directed 30 mL 12   gabapentin (NEURONTIN) 300 MG capsule TAKE 2 CAPSULES BY MOUTH AT  BEDTIME 200 capsule 2   losartan (COZAAR) 25 MG tablet Take 1 tablet (25 mg total) by mouth daily. 90 tablet 1   terazosin (HYTRIN) 2 MG capsule TAKE 1 CAPSULE BY MOUTH AT  BEDTIME 100 capsule 2   vitamin B-12 (CYANOCOBALAMIN) 500 MCG tablet Take 500 mcg by mouth in the  morning.     No current facility-administered medications for this visit.    Allergies as of 04/03/2023 - Review Complete 04/03/2023  Allergen Reaction Noted   Motrin [ibuprofen] Swelling 07/16/2012    Family History  Problem Relation Age of Onset   Stroke Mother    Hypertension Father    Cancer Brother        Throat cancer   Kidney disease Daughter     Social History   Socioeconomic History   Marital status: Widowed    Spouse name: Not on file   Number of children: 4   Years of education: 10    Highest education level: Not on file  Occupational History   Not on file  Tobacco Use   Smoking status: Every Day    Packs/day: 1.00    Years: 50.00    Additional pack years: 0.00    Total pack years: 50.00    Types: Cigarettes    Passive exposure: Current   Smokeless tobacco: Never   Tobacco comments:    1/2 to 1 pack x50 yrs.  Substance and Sexual Activity   Alcohol use: Yes    Alcohol/week: 6.0 standard drinks of alcohol    Types: 6 Cans of beer per week    Comment: Patient states he drinks a couple of beers almost daily in the afternoon,   Drug use: No   Sexual activity: Not on file  Other Topics Concern   Not on file  Social History Narrative   Patient lives with friend has 4 children   Social Determinants of Health   Financial Resource Strain: Low Risk  (11/15/2022)   Overall Financial Resource Strain (CARDIA)    Difficulty of Paying Living Expenses: Not hard at all  Food Insecurity: No Food Insecurity (11/15/2022)   Hunger Vital Sign    Worried About Running Out of Food in the Last Year: Never true    Ran Out of Food in the Last Year: Never true  Transportation Needs: No Transportation Needs (11/15/2022)   PRAPARE - Administrator, Civil Service (Medical): No    Lack of Transportation (Non-Medical): No  Physical Activity: Sufficiently Active (11/15/2022)   Exercise Vital Sign    Days of Exercise per Week: 7 days    Minutes of Exercise per Session: 50 min  Stress: No Stress Concern Present (11/15/2022)   [employer] of Occupational Health - Occupational Stress Questionnaire    Feeling of Stress : Not at all  Social Connections: Socially Isolated (11/15/2022)   Social Connection and Isolation Panel [NHANES]    Frequency of Communication with Friends and Family: Three times a week    Frequency of Social Gatherings with Friends and Family: Three times a week    Attends Religious Services: Never    Active Member of Clubs or Organizations: No     Attends Banker Meetings: Never    Marital Status: Widowed   Review of systems General: negative for malaise, night sweats, fever, chills, weight loss Neck: Negative for lumps, goiter, pain and significant neck swelling Resp: Negative for cough, wheezing, dyspnea at rest CV: Negative for chest pain, leg swelling, palpitations, orthopnea GI: denies melena, hematochezia, nausea, vomiting, diarrhea, constipation, dysphagia, odyonophagia, early satiety or unintentional weight loss.  MSK: Negative for joint pain or swelling, back pain, and muscle pain. Derm: Negative for itching or rash Psych: Denies depression, anxiety, memory loss, confusion. No homicidal or suicidal ideation.  Heme: Negative for prolonged bleeding, bruising easily,  and swollen nodes. Endocrine: Negative for cold or heat intolerance, polyuria, polydipsia and goiter. Neuro: negative for tremor, gait imbalance, syncope and seizures. The remainder of the review of systems is noncontributory.  Physical Exam: BP 137/77 (BP Location: Left Arm, Patient Position: Sitting, Cuff Size: Large)   Pulse 69   Temp 98.1 F (36.7 C) (Oral)   Ht 5\' 7"  (1.702 m)   Wt 161 lb 4.8 oz (73.2 kg)   BMI 25.26 kg/m  General:   Alert and oriented. No distress noted. Pleasant and cooperative.  Head:  Normocephalic and atraumatic. Eyes:  Conjuctiva clear without scleral icterus. Mouth:  Oral mucosa pink and moist. Good dentition. No lesions. Heart: Normal rate and rhythm, s1 and s2 heart sounds present.  Lungs: Clear lung sounds in all lobes. Respirations equal and unlabored. Abdomen:  +BS, soft, non-tender and non-distended. No rebound or guarding. No HSM or masses noted. Derm: No palmar erythema or jaundice Msk:  Symmetrical without gross deformities. Normal posture. Extremities:  Without edema. Neurologic:  Alert and  oriented x4 Psych:  Alert and cooperative. Normal mood and affect.  Invalid input(s): "6 MONTHS"    ASSESSMENT: TROI FLORENDO is a 77 y.o. male presenting today for follow up of esophageal cancer in 2013  Patient doing well today. He has no GI complaints. No red flag symptoms. Patient denies melena, hematochezia, nausea, vomiting, diarrhea, constipation, dysphagia, odyonophagia, early satiety or weight loss. While there are no clear cut guidelines on surveillance EGDs for esophageal cancer, would recommend repeating surveillance EGD as there has been no evaluation since 2013. At this time patient is unsure if he wants to proceed with this, he will think about it and let me know if he wishes to move forward with it.   PLAN:  Pt to consider surveillance EGD   All questions were answered, patient verbalized understanding and is in agreement with plan as outlined above.   Follow Up: 1 year   Nohely Whitehorn L. Jeanmarie Hubert, MSN, APRN, AGNP-C Adult-Gerontology Nurse Practitioner [facility]  I have reviewed the note and agree with the APP's assessment as described in this progress note  Katrinka Blazing, MD Gastroenterology and Hepatology [facility]

## 2023-04-04 NOTE — Addendum Note (Signed)
Addended by: Dolores Frame on: 04/04/2023 01:53 PM   Modules accepted: Level of Service

## 2023-06-02 ENCOUNTER — Other Ambulatory Visit: Payer: Self-pay | Admitting: Internal Medicine

## 2023-06-02 DIAGNOSIS — J302 Other seasonal allergic rhinitis: Secondary | ICD-10-CM

## 2023-06-07 ENCOUNTER — Other Ambulatory Visit: Payer: Self-pay | Admitting: *Deleted

## 2023-06-07 DIAGNOSIS — F17210 Nicotine dependence, cigarettes, uncomplicated: Secondary | ICD-10-CM

## 2023-06-07 DIAGNOSIS — Z122 Encounter for screening for malignant neoplasm of respiratory organs: Secondary | ICD-10-CM

## 2023-06-07 DIAGNOSIS — Z87891 Personal history of nicotine dependence: Secondary | ICD-10-CM

## 2023-07-04 ENCOUNTER — Encounter: Payer: Self-pay | Admitting: Internal Medicine

## 2023-07-04 ENCOUNTER — Ambulatory Visit (INDEPENDENT_AMBULATORY_CARE_PROVIDER_SITE_OTHER): Payer: Medicare Other | Admitting: Internal Medicine

## 2023-07-04 VITALS — BP 138/77 | HR 73 | Ht 67.0 in | Wt 159.6 lb

## 2023-07-04 DIAGNOSIS — Z720 Tobacco use: Secondary | ICD-10-CM

## 2023-07-04 DIAGNOSIS — I10 Essential (primary) hypertension: Secondary | ICD-10-CM | POA: Diagnosis not present

## 2023-07-04 DIAGNOSIS — B0229 Other postherpetic nervous system involvement: Secondary | ICD-10-CM | POA: Diagnosis not present

## 2023-07-04 DIAGNOSIS — N1831 Chronic kidney disease, stage 3a: Secondary | ICD-10-CM

## 2023-07-04 DIAGNOSIS — N401 Enlarged prostate with lower urinary tract symptoms: Secondary | ICD-10-CM

## 2023-07-04 DIAGNOSIS — J302 Other seasonal allergic rhinitis: Secondary | ICD-10-CM

## 2023-07-04 DIAGNOSIS — J432 Centrilobular emphysema: Secondary | ICD-10-CM

## 2023-07-04 DIAGNOSIS — R351 Nocturia: Secondary | ICD-10-CM

## 2023-07-04 MED ORDER — AZELASTINE HCL 0.1 % NA SOLN: 2.0000 | Freq: Two times a day (BID) | NASAL | 2 refills | Status: DC

## 2023-07-04 NOTE — Assessment & Plan Note (Addendum)
Well controlled On Terazosin

## 2023-07-04 NOTE — Assessment & Plan Note (Addendum)
Has mild dyspnea upon exertion Recently started albuterol inhaler as needed for dyspnea and wheezing Needs to quit smoking If frequent need of  albuterol inhaler, plan to add maintenance inhaler

## 2023-07-04 NOTE — Assessment & Plan Note (Signed)
Switched from [employer] to azelastine nasal spray - now better

## 2023-07-04 NOTE — Assessment & Plan Note (Addendum)
BP Readings from Last 1 Encounters:  07/04/23 138/77   Well-controlled with Amlodipine 10 mg QD and losartan 25 mg QD now Plan to increase dose of losartan for proteinuria after checking CMP Counseled for compliance with the medications Advised DASH diet and moderate exercise/walking, at least 150 mins/week

## 2023-07-04 NOTE — Assessment & Plan Note (Signed)
On Gabapentin 600 mg qHS

## 2023-07-04 NOTE — Assessment & Plan Note (Signed)
Smokes about 0.5 pack/day now, cut down from 1 pack/day  Asked about quitting: confirms that he currently smokes cigarettes Advise to quit smoking: Educated about QUITTING to reduce the risk of cancer, cardio and cerebrovascular disease. Assess willingness: Unwilling to quit at this time, but is working on cutting back. Assist with counseling and pharmacotherapy: Counseled for 5 minutes and literature provided. Did not tolerate Chantix. Arrange for follow up: Follow up in 3 months and continue to offer help.

## 2023-07-04 NOTE — Assessment & Plan Note (Addendum)
Check CMP - recent decrease in GFR likely due to ARB Avoid nephrotoxic agents Has proteinuria - on losartan now Needs to improve hydration

## 2023-07-04 NOTE — Progress Notes (Signed)
Established Patient Office Visit  Subjective:  Patient ID: Eddie [name], male    DOB: 1946-05-07  Age: 77 y.o. MRN: [id number]  CC:  Chief Complaint  Patient presents with   Hypertension    Four month follow up    Chronic Kidney Disease    Four month follow up     HPI Eddie [name] is a 77 y.o. male with past medical history of HTN, esophageal ca. s/p chemoradiation, COPD, postherpetic neuralgia, CKD and colonic polyps who presents for f/u of his chronic medical conditions.  BP is wnl today. Takes medications regularly. Patient denies headache, dizziness, chest pain, or palpitations.  He has CKD stage 3a.  His GFR had worsened slightly compared to prior in the last visit. Of note, he was recently placed on losartan.  Denies any dysuria, hematuria, urinary resistance or hesitance. Does report nocturia.  He had proteinuria on last urine test.  He has cut down smoking to half pack per day now. He was not able to tolerate Chantix due to nightmares.  He complains of mild dyspnea upon exertion, but has felt better with albuterol inhaler as needed.  Denies any fever, chills or wheezing currently.  He uses azelastine nasal spray for allergic rhinitis.   Past Medical History:  Diagnosis Date   Chronic back pain    Duodenal ulcer 09/2012   GERD (gastroesophageal reflux disease)    Helicobacter pylori (H. pylori) infection 09/2012   Hypertension    Severe malnutrition (HCC) 10/26/2012   Squamous cell carcinoma Oct 2014   Esophogus   Squamous cell carcinoma of esophagus (HCC) 09/25/2012    Past Surgical History:  Procedure Laterality Date   COLONOSCOPY N/A 12/26/2013   Procedure: COLONOSCOPY;  Surgeon: Malissa Hippo, MD;  Location: [facility];  Service: Endoscopy;  Laterality: N/A;  830   COLONOSCOPY N/A 03/25/2021   Procedure: COLONOSCOPY;  Surgeon: Malissa Hippo, MD;  Location: [facility];  Service: Endoscopy;  Laterality: N/A;  AM   ESOPHAGOGASTRODUODENOSCOPY  09/21/2012    Procedure: ESOPHAGOGASTRODUODENOSCOPY (EGD);  Surgeon: Malissa Hippo, MD;  Location: [facility];  Service: Endoscopy;  Laterality: N/A;  345   PEG PLACEMENT  09/21/2012   Procedure: PERCUTANEOUS ENDOSCOPIC GASTROSTOMY (PEG) PLACEMENT;  Surgeon: Malissa Hippo, MD;  Location: [facility];  Service: Endoscopy;  Laterality: N/A;   PEG TUBE PLACEMENT  09/21/2012   PORTACATH PLACEMENT     REMOVAL OF GASTROSTOMY TUBE     august 2014    Family History  Problem Relation Age of Onset   Stroke Mother    Hypertension Father    Cancer Brother        Throat cancer   Kidney disease Daughter     Social History   Socioeconomic History   Marital status: Widowed    Spouse name: Not on file   Number of children: 4   Years of education: 10   Highest education level: Not on file  Occupational History   Not on file  Tobacco Use   Smoking status: Every Day    Current packs/day: 1.00    Average packs/day: 1 pack/day for 50.0 years (50.0 ttl pk-yrs)    Types: Cigarettes    Passive exposure: Current   Smokeless tobacco: Never   Tobacco comments:    1/2 to 1 pack x50 yrs.  Substance and Sexual Activity   Alcohol use: Yes    Alcohol/week: 6.0 standard drinks of alcohol    Types:  6 Cans of beer per week    Comment: Patient states he drinks a couple of beers almost daily in the afternoon,   Drug use: No   Sexual activity: Not on file  Other Topics Concern   Not on file  Social History Narrative   Patient lives with friend has 4 children   Social Determinants of Health   Financial Resource Strain: Low Risk  (11/15/2022)   Overall Financial Resource Strain (CARDIA)    Difficulty of Paying Living Expenses: Not hard at all  Food Insecurity: No Food Insecurity (11/15/2022)   Hunger Vital Sign    Worried About Running Out of Food in the Last Year: Never true    Ran Out of Food in the Last Year: Never true  Transportation Needs: No Transportation Needs (11/15/2022)   PRAPARE -  Administrator, Civil Service (Medical): No    Lack of Transportation (Non-Medical): No  Physical Activity: Sufficiently Active (11/15/2022)   Exercise Vital Sign    Days of Exercise per Week: 7 days    Minutes of Exercise per Session: 50 min  Stress: No Stress Concern Present (11/15/2022)   [employer] of Occupational Health - Occupational Stress Questionnaire    Feeling of Stress : Not at all  Social Connections: Socially Isolated (11/15/2022)   Social Connection and Isolation Panel [NHANES]    Frequency of Communication with Friends and Family: Three times a week    Frequency of Social Gatherings with Friends and Family: Three times a week    Attends Religious Services: Never    Active Member of Clubs or Organizations: No    Attends Banker Meetings: Never    Marital Status: Widowed  Intimate Partner Violence: Not At Risk (11/15/2022)   Humiliation, Afraid, Rape, and Kick questionnaire    Fear of Current or Ex-Partner: No    Emotionally Abused: No    Physically Abused: No    Sexually Abused: No    Outpatient Medications Prior to Visit  Medication Sig Dispense Refill   albuterol (VENTOLIN HFA) 108 (90 Base) MCG/ACT inhaler Inhale 2 puffs into the lungs every 6 (six) hours as needed for wheezing or shortness of breath. 18 g 2   amLODipine (NORVASC) 10 MG tablet TAKE 1 TABLET BY MOUTH ONCE  DAILY 100 tablet 2   atorvastatin (LIPITOR) 40 MG tablet TAKE 1 TABLET BY MOUTH ONCE  DAILY 100 tablet 2   gabapentin (NEURONTIN) 300 MG capsule TAKE 2 CAPSULES BY MOUTH AT  BEDTIME 200 capsule 2   losartan (COZAAR) 25 MG tablet Take 1 tablet (25 mg total) by mouth daily. 90 tablet 1   terazosin (HYTRIN) 2 MG capsule TAKE 1 CAPSULE BY MOUTH AT  BEDTIME 100 capsule 2   vitamin B-12 (CYANOCOBALAMIN) 500 MCG tablet Take 500 mcg by mouth in the morning.     azelastine (ASTELIN) 0.1 % nasal spray PLACE 2 SPRAYS INTO BOTH NOSTRILS TWICE DAILY AS DIRECTED. 30 mL 0   No  facility-administered medications prior to visit.    Allergies  Allergen Reactions   Motrin [Ibuprofen] Swelling    ROS Review of Systems  Constitutional:  Negative for chills and fever.  HENT:  Positive for congestion. Negative for postnasal drip and sore throat.   Eyes:  Negative for pain and discharge.  Respiratory:  Positive for shortness of breath. Negative for cough.   Cardiovascular:  Negative for chest pain and palpitations.  Gastrointestinal:  Negative for diarrhea, nausea and vomiting.  Endocrine: Negative for polydipsia and polyuria.  Genitourinary:  Negative for dysuria and hematuria.  Musculoskeletal:  Negative for neck pain and neck stiffness.  Skin:  Negative for rash.  Neurological:  Negative for dizziness, weakness, numbness and headaches.  Psychiatric/Behavioral:  Negative for agitation and behavioral problems.       Objective:    Physical Exam Vitals reviewed.  Constitutional:      General: He is not in acute distress.    Appearance: He is not diaphoretic.  HENT:     Head: Normocephalic and atraumatic.     Nose: Congestion present.     Mouth/Throat:     Mouth: Mucous membranes are moist.  Eyes:     General: No scleral icterus.    Extraocular Movements: Extraocular movements intact.  Cardiovascular:     Rate and Rhythm: Normal rate and regular rhythm.     Pulses: Normal pulses.     Heart sounds: Normal heart sounds. No murmur heard. Pulmonary:     Breath sounds: Normal breath sounds. No wheezing or rales.  Musculoskeletal:     Cervical back: Neck supple. No tenderness.     Right lower leg: No edema.     Left lower leg: No edema.  Skin:    General: Skin is warm.     Findings: No rash.  Neurological:     General: No focal deficit present.     Mental Status: He is alert and oriented to person, place, and time.     Sensory: No sensory deficit.     Motor: No weakness.  Psychiatric:        Mood and Affect: Mood normal.        Behavior: Behavior  normal.     BP 138/77 (BP Location: Left Arm, Patient Position: Sitting, Cuff Size: Normal)   Pulse 73   Ht 5\' 7"  (1.702 m)   Wt 159 lb 9.6 oz (72.4 kg)   SpO2 95%   BMI 25.00 kg/m  Wt Readings from Last 3 Encounters:  07/04/23 159 lb 9.6 oz (72.4 kg)  04/03/23 161 lb 4.8 oz (73.2 kg)  03/03/23 159 lb 9.6 oz (72.4 kg)    Lab Results  Component Value Date   TSH 3.700 03/16/2023   Lab Results  Component Value Date   WBC 12.6 (H) 10/31/2022   HGB 16.1 10/31/2022   HCT 48.2 10/31/2022   MCV 96 10/31/2022   PLT 226 10/31/2022   Lab Results  Component Value Date   NA 133 (L) 03/16/2023   K 4.3 03/16/2023   CO2 17 (L) 03/16/2023   GLUCOSE 91 03/16/2023   BUN 16 03/16/2023   CREATININE 1.82 (H) 03/16/2023   BILITOT 0.8 10/31/2022   ALKPHOS 113 10/31/2022   AST 24 10/31/2022   ALT 17 10/31/2022   PROT 7.1 10/31/2022   ALBUMIN 4.6 10/31/2022   CALCIUM 9.8 03/16/2023   EGFR 38 (L) 03/16/2023   Lab Results  Component Value Date   CHOL 161 10/31/2022   Lab Results  Component Value Date   HDL 79 10/31/2022   Lab Results  Component Value Date   LDLCALC 62 10/31/2022   Lab Results  Component Value Date   TRIG 112 10/31/2022   Lab Results  Component Value Date   CHOLHDL 2.0 10/31/2022   Lab Results  Component Value Date   HGBA1C 4.6 (L) 10/31/2022      Assessment & Plan:   Problem List Items Addressed This Visit  Cardiovascular and Mediastinum   Essential hypertension, benign    BP Readings from Last 1 Encounters:  07/04/23 138/77   Well-controlled with Amlodipine 10 mg QD and losartan 25 mg QD now Plan to increase dose of losartan for proteinuria after checking CMP Counseled for compliance with the medications Advised DASH diet and moderate exercise/walking, at least 150 mins/week        Respiratory   Pulmonary emphysema (HCC) - Primary    Has mild dyspnea upon exertion Recently started albuterol inhaler as needed for dyspnea and  wheezing Needs to quit smoking If frequent need of  albuterol inhaler, plan to add maintenance inhaler      Relevant Medications   azelastine (ASTELIN) 0.1 % nasal spray     Nervous and Auditory   Post herpetic neuralgia    On Gabapentin 600 mg qHS        Genitourinary   CKD (chronic kidney disease) stage 3, GFR 30-59 ml/min (HCC)    Check CMP - recent decrease in GFR likely due to ARB Avoid nephrotoxic agents Has proteinuria - on losartan now Needs to improve hydration      Relevant Orders   CMP14+EGFR   Parathyroid hormone, intact (no Ca)   Urine Microalbumin w/creat. ratio   Benign prostatic hyperplasia with lower urinary tract symptoms    Well controlled On Terazosin        Other   Tobacco abuse    Smokes about 0.5 pack/day now, cut down from 1 pack/day  Asked about quitting: confirms that he currently smokes cigarettes Advise to quit smoking: Educated about QUITTING to reduce the risk of cancer, cardio and cerebrovascular disease. Assess willingness: Unwilling to quit at this time, but is working on cutting back. Assist with counseling and pharmacotherapy: Counseled for 5 minutes and literature provided. Did not tolerate Chantix. Arrange for follow up: Follow up in 3 months and continue to offer help.      Seasonal allergies    Switched from Flonase to azelastine nasal spray - now better      Relevant Medications   azelastine (ASTELIN) 0.1 % nasal spray     Meds ordered this encounter  Medications   azelastine (ASTELIN) 0.1 % nasal spray    Sig: Place 2 sprays into both nostrils 2 (two) times daily. Use in each nostril as directed    Dispense:  30 mL    Refill:  2    Follow-up: Return in about 4 months (around 11/04/2023).    Anabel Halon, MD

## 2023-07-04 NOTE — Patient Instructions (Signed)
Please continue to take medications as prescribed.  Please continue to follow low salt diet and perform moderate exercise/walking at least 150 mins/week.  Please maintain at least 64 ounces of fluid in a day.

## 2023-07-18 ENCOUNTER — Other Ambulatory Visit: Payer: Medicare Other

## 2023-07-18 NOTE — Patient Instructions (Signed)

## 2023-07-18 NOTE — Progress Notes (Unsigned)
Virtual Visit via Telephone Note  I connected with Eddie [name] on 07/18/23 at  8:30 AM EDT by telephone and verified that I am speaking with the correct person using two identifiers.  Location: Patient: Home Provider: Office   I discussed the limitations, risks, security and privacy concerns of performing an evaluation and management service by telephone and the availability of in person appointments. I also discussed with the patient that there may be a patient responsible charge related to this service. The patient expressed understanding and agreed to proceed.  Shared Decision Making Visit Lung Cancer Screening Program [phone])   Eligibility: Age 77 y.o. Pack Years Smoking History Calculation 45 (# packs/per year x # years smoked) Recent History of coughing up blood  no Unexplained weight loss? no ( >Than 15 pounds within the last 6 months ) Prior History Lung / other cancer no (Diagnosis within the last 5 years already requiring surveillance chest CT Scans). Smoking Status Current Smoker Former Smokers: Years since quit: < 1 year  Quit Date: NA  Visit Components: Discussion included one or more decision making aids. yes Discussion included risk/benefits of screening. yes Discussion included potential follow up diagnostic testing for abnormal scans. yes Discussion included meaning and risk of over diagnosis. yes Discussion included meaning and risk of False Positives. yes Discussion included meaning of total radiation exposure. yes  Counseling Included: Importance of adherence to annual lung cancer LDCT screening. yes Impact of comorbidities on ability to participate in the program. yes Ability and willingness to under diagnostic treatment. yes  Smoking Cessation Counseling: Current Smokers:  Discussed importance of smoking cessation. yes Information about tobacco cessation classes and interventions provided to patient. yes Patient provided with "ticket" for LDCT Scan.  NA Symptomatic Patient. no  Counseling(Intermediate counseling: > three minutes) [id number] Diagnosis Code: Tobacco Use Z72.0 Asymptomatic Patient yes  Counseling (Intermediate counseling: > three minutes counseling) [id number] Former Smokers:  Discussed the importance of maintaining cigarette abstinence. yes Diagnosis Code: Personal History of Nicotine Dependence. U98.119 Information about tobacco cessation classes and interventions provided to patient. Yes Patient provided with "ticket" for LDCT Scan. NA Written Order for Lung Cancer Screening with LDCT placed in Epic. Yes (CT Chest Lung Cancer Screening Low Dose W/O CM) [id number] Z12.2-Screening of respiratory organs Z87.891-Personal history of nicotine dependence  I have spent 25 minutes of face to face/ virtual visit   time with Mr Greenwell discussing the risks and benefits of lung cancer screening. We viewed / discussed a power point together that explained in detail the above noted topics. We paused at intervals to allow for questions to be asked and answered to ensure understanding.We discussed that the single most powerful action that he can take to decrease his risk of developing lung cancer is to quit smoking. We discussed whether or not he is ready to commit to setting a quit date. We discussed options for tools to aid in quitting smoking including nicotine replacement therapy, non-nicotine medications, support groups, Quit Smart classes, and behavior modification. We discussed that often times setting smaller, more achievable goals, such as eliminating 1 cigarette a day for a week and then 2 cigarettes a day for a week can be helpful in slowly decreasing the number of cigarettes smoked. This allows for a sense of accomplishment as well as providing a clinical benefit. I provided  him  with smoking cessation  information  with contact information for community resources, classes, free nicotine replacement therapy, and access to mobile apps, text  messaging,  and on-line smoking cessation help. I have also provided  him  the office contact information in the event he needs to contact me, or the screening staff. We discussed the time and location of the scan, and that either Abigail Miyamoto RN, Karlton Lemon, RN  or I will call / send a letter with the results within 24-72 hours of receiving them. The patient verbalized understanding of all of  the above and had no further questions upon leaving the office. They have my contact information in the event they have any further questions.  I spent 3-5 minutes counseling on smoking cessation and the health risks of continued tobacco abuse.  I explained to the patient that there has been a high incidence of coronary artery disease noted on these exams. I explained that this is a non-gated exam therefore degree or severity cannot be determined. This patient is on statin therapy. I have asked the patient to follow-up with their PCP regarding any incidental finding of coronary artery disease and management with diet or medication as their PCP  feels is clinically indicated. The patient verbalized understanding of the above and had no further questions upon completion of the visit.     Glenford Bayley, NP

## 2023-07-19 ENCOUNTER — Ambulatory Visit (HOSPITAL_COMMUNITY)
Admission: RE | Admit: 2023-07-19 | Discharge: 2023-07-19 | Disposition: A | Discharge status: Home / Self Care | Payer: Medicare Other | Source: Ambulatory Visit | Attending: Acute Care | Admitting: Acute Care

## 2023-07-19 ENCOUNTER — Ambulatory Visit (INDEPENDENT_AMBULATORY_CARE_PROVIDER_SITE_OTHER): Payer: Medicare Other | Admitting: Primary Care

## 2023-07-19 ENCOUNTER — Encounter (HOSPITAL_COMMUNITY): Payer: Self-pay

## 2023-07-19 DIAGNOSIS — Z87891 Personal history of nicotine dependence: Secondary | ICD-10-CM

## 2023-07-19 DIAGNOSIS — Z122 Encounter for screening for malignant neoplasm of respiratory organs: Secondary | ICD-10-CM

## 2023-07-19 DIAGNOSIS — F17210 Nicotine dependence, cigarettes, uncomplicated: Secondary | ICD-10-CM

## 2023-07-25 ENCOUNTER — Ambulatory Visit: Payer: Medicare Other | Admitting: Urology

## 2023-07-25 DIAGNOSIS — R972 Elevated prostate specific antigen [PSA]: Secondary | ICD-10-CM

## 2023-08-31 ENCOUNTER — Other Ambulatory Visit: Payer: Self-pay | Admitting: Internal Medicine

## 2023-08-31 DIAGNOSIS — I10 Essential (primary) hypertension: Secondary | ICD-10-CM

## 2023-10-09 ENCOUNTER — Other Ambulatory Visit: Payer: Self-pay | Admitting: Internal Medicine

## 2023-10-09 DIAGNOSIS — J302 Other seasonal allergic rhinitis: Secondary | ICD-10-CM

## 2023-11-06 ENCOUNTER — Ambulatory Visit (INDEPENDENT_AMBULATORY_CARE_PROVIDER_SITE_OTHER): Payer: Medicare Other | Admitting: Internal Medicine

## 2023-11-06 ENCOUNTER — Encounter: Payer: Self-pay | Admitting: Internal Medicine

## 2023-11-06 VITALS — BP 136/62 | HR 74 | Ht 67.0 in | Wt 160.8 lb

## 2023-11-06 DIAGNOSIS — N1832 Chronic kidney disease, stage 3b: Secondary | ICD-10-CM | POA: Diagnosis not present

## 2023-11-06 DIAGNOSIS — J432 Centrilobular emphysema: Secondary | ICD-10-CM | POA: Diagnosis not present

## 2023-11-06 DIAGNOSIS — N401 Enlarged prostate with lower urinary tract symptoms: Secondary | ICD-10-CM

## 2023-11-06 DIAGNOSIS — Z23 Encounter for immunization: Secondary | ICD-10-CM

## 2023-11-06 DIAGNOSIS — Z0001 Encounter for general adult medical examination with abnormal findings: Secondary | ICD-10-CM

## 2023-11-06 DIAGNOSIS — R739 Hyperglycemia, unspecified: Secondary | ICD-10-CM | POA: Diagnosis not present

## 2023-11-06 DIAGNOSIS — E782 Mixed hyperlipidemia: Secondary | ICD-10-CM

## 2023-11-06 DIAGNOSIS — R351 Nocturia: Secondary | ICD-10-CM

## 2023-11-06 DIAGNOSIS — N400 Enlarged prostate without lower urinary tract symptoms: Secondary | ICD-10-CM

## 2023-11-06 DIAGNOSIS — I10 Essential (primary) hypertension: Secondary | ICD-10-CM

## 2023-11-06 NOTE — Assessment & Plan Note (Signed)
Has mild dyspnea upon exertion Has albuterol inhaler as needed for dyspnea and wheezing Needs to quit smoking If frequent need of albuterol inhaler, plan to add maintenance inhaler

## 2023-11-06 NOTE — Patient Instructions (Addendum)
Please continue to take medications as prescribed.  Please continue to follow low salt diet and perform moderate exercise/walking at least 150 mins/week.  Please maintain at least 64 ounces of fluid intake in a day.  Please consider getting Tdap vaccine at local pharmacy.

## 2023-11-06 NOTE — Assessment & Plan Note (Signed)
On Lipitor Checked lipid profile

## 2023-11-06 NOTE — Assessment & Plan Note (Signed)
 Physical exam as documented. Counseling done  re healthy lifestyle involving commitment to 150 minutes exercise per week, heart healthy diet, and attaining healthy weight.The importance of adequate sleep also discussed. Immunization and cancer screening needs are specifically addressed at this visit.

## 2023-11-06 NOTE — Assessment & Plan Note (Signed)
BP Readings from Last 1 Encounters:  11/06/23 136/62   Well-controlled with Amlodipine 10 mg QD and losartan 25 mg QD now On losartan for proteinuria Counseled for compliance with the medications Advised DASH diet and moderate exercise/walking, at least 150 mins/week

## 2023-11-06 NOTE — Progress Notes (Unsigned)
Established Patient Office Visit  Subjective:  Patient ID: Eddie [name], male    DOB: 10-Mar-1946  Age: 77 y.o. MRN: [id number]  CC:  Chief Complaint  Patient presents with   Hypertension    Follow up     HPI [name] is a 77 y.o. male with past medical history of HTN, esophageal ca. s/p chemoradiation, COPD, postherpetic neuralgia, CKD and colonic polyps who presents for f/u of his chronic medical conditions.  BP is wnl today. Takes medications regularly. Patient denies headache, dizziness, chest pain, or palpitations.  He has CKD stage 3a.  His GFR had worsened slightly compared to prior in the last visit. Of note, he was recently placed on losartan.  Denies any dysuria, hematuria, urinary resistance or hesitance. Does report nocturia.  He had proteinuria on last urine test.  He has cut down smoking to half pack per day now. He was not able to tolerate Chantix due to nightmares.  He complains of mild dyspnea upon exertion, but has felt better with albuterol inhaler as needed.  Denies any fever, chills or wheezing currently.  He uses azelastine nasal spray for allergic rhinitis.   Past Medical History:  Diagnosis Date   Chronic back pain    Duodenal ulcer 09/2012   GERD (gastroesophageal reflux disease)    Helicobacter pylori (H. pylori) infection 09/2012   Hypertension    Severe malnutrition (HCC) 10/26/2012   Squamous cell carcinoma Oct 2014   Esophogus   Squamous cell carcinoma of esophagus (HCC) 09/25/2012    Past Surgical History:  Procedure Laterality Date   COLONOSCOPY N/A 12/26/2013   Procedure: COLONOSCOPY;  Surgeon: Malissa Hippo, MD;  Location: [facility];  Service: Endoscopy;  Laterality: N/A;  830   COLONOSCOPY N/A 03/25/2021   Procedure: COLONOSCOPY;  Surgeon: Malissa Hippo, MD;  Location: [facility];  Service: Endoscopy;  Laterality: N/A;  AM   ESOPHAGOGASTRODUODENOSCOPY  09/21/2012   Procedure: ESOPHAGOGASTRODUODENOSCOPY (EGD);  Surgeon: Malissa Hippo, MD;  Location: [facility];  Service: Endoscopy;  Laterality: N/A;  345   PEG PLACEMENT  09/21/2012   Procedure: PERCUTANEOUS ENDOSCOPIC GASTROSTOMY (PEG) PLACEMENT;  Surgeon: Malissa Hippo, MD;  Location: [facility];  Service: Endoscopy;  Laterality: N/A;   PEG TUBE PLACEMENT  09/21/2012   PORTACATH PLACEMENT     REMOVAL OF GASTROSTOMY TUBE     august 2014    Family History  Problem Relation Age of Onset   Stroke Mother    Hypertension Father    Cancer Brother        Throat cancer   Kidney disease Daughter     Social History   Socioeconomic History   Marital status: Widowed    Spouse name: Not on file   Number of children: 4   Years of education: 10   Highest education level: Not on file  Occupational History   Not on file  Tobacco Use   Smoking status: Every Day    Current packs/day: 1.00    Average packs/day: 1 pack/day for 50.0 years (50.0 ttl pk-yrs)    Types: Cigarettes    Passive exposure: Current   Smokeless tobacco: Never   Tobacco comments:    1/2 to 1 pack x50 yrs.  Substance and Sexual Activity   Alcohol use: Yes    Alcohol/week: 6.0 standard drinks of alcohol    Types: 6 Cans of beer per week    Comment: Patient states he drinks a  couple of beers almost daily in the afternoon,   Drug use: No   Sexual activity: Not on file  Other Topics Concern   Not on file  Social History Narrative   Patient lives with friend has 4 children   Social Determinants of Health   Financial Resource Strain: Low Risk  (11/15/2022)   Overall Financial Resource Strain (CARDIA)    Difficulty of Paying Living Expenses: Not hard at all  Food Insecurity: No Food Insecurity (11/15/2022)   Hunger Vital Sign    Worried About Running Out of Food in the Last Year: Never true    Ran Out of Food in the Last Year: Never true  Transportation Needs: No Transportation Needs (11/15/2022)   PRAPARE - Administrator, Civil Service (Medical): No    Lack of  Transportation (Non-Medical): No  Physical Activity: Sufficiently Active (11/15/2022)   Exercise Vital Sign    Days of Exercise per Week: 7 days    Minutes of Exercise per Session: 50 min  Stress: No Stress Concern Present (11/15/2022)   [employer] of Occupational Health - Occupational Stress Questionnaire    Feeling of Stress : Not at all  Social Connections: Socially Isolated (11/15/2022)   Social Connection and Isolation Panel [NHANES]    Frequency of Communication with Friends and Family: Three times a week    Frequency of Social Gatherings with Friends and Family: Three times a week    Attends Religious Services: Never    Active Member of Clubs or Organizations: No    Attends Banker Meetings: Never    Marital Status: Widowed  Intimate Partner Violence: Not At Risk (11/15/2022)   Humiliation, Afraid, Rape, and Kick questionnaire    Fear of Current or Ex-Partner: No    Emotionally Abused: No    Physically Abused: No    Sexually Abused: No    Outpatient Medications Prior to Visit  Medication Sig Dispense Refill   albuterol (VENTOLIN HFA) 108 (90 Base) MCG/ACT inhaler Inhale 2 puffs into the lungs every 6 (six) hours as needed for wheezing or shortness of breath. 18 g 2   amLODipine (NORVASC) 10 MG tablet TAKE 1 TABLET BY MOUTH ONCE  DAILY 100 tablet 2   atorvastatin (LIPITOR) 40 MG tablet TAKE 1 TABLET BY MOUTH ONCE  DAILY 100 tablet 2   azelastine (ASTELIN) 0.1 % nasal spray PLACE 2 SPRAYS INTO BOTH NOSTRILS 2 TIMES A DAY 30 mL 2   gabapentin (NEURONTIN) 300 MG capsule TAKE 2 CAPSULES BY MOUTH AT  BEDTIME 200 capsule 2   losartan (COZAAR) 25 MG tablet TAKE (1) TABLET BY MOUTH ONCE DAILY. 90 tablet 1   terazosin (HYTRIN) 2 MG capsule TAKE 1 CAPSULE BY MOUTH AT  BEDTIME 100 capsule 2   vitamin B-12 (CYANOCOBALAMIN) 500 MCG tablet Take 500 mcg by mouth in the morning.     No facility-administered medications prior to visit.    Allergies  Allergen Reactions    Motrin [Ibuprofen] Swelling    ROS Review of Systems  Constitutional:  Negative for chills and fever.  HENT:  Positive for congestion. Negative for postnasal drip and sore throat.   Eyes:  Negative for pain and discharge.  Respiratory:  Positive for shortness of breath. Negative for cough.   Cardiovascular:  Negative for chest pain and palpitations.  Gastrointestinal:  Negative for diarrhea, nausea and vomiting.  Endocrine: Negative for polydipsia and polyuria.  Genitourinary:  Negative for dysuria and hematuria.  Musculoskeletal:  Negative for neck pain and neck stiffness.  Skin:  Negative for rash.  Neurological:  Negative for dizziness, weakness, numbness and headaches.  Psychiatric/Behavioral:  Negative for agitation and behavioral problems.       Objective:    Physical Exam Vitals reviewed.  Constitutional:      General: He is not in acute distress.    Appearance: He is not diaphoretic.  HENT:     Head: Normocephalic and atraumatic.     Nose: Congestion present.     Mouth/Throat:     Mouth: Mucous membranes are moist.  Eyes:     General: No scleral icterus.    Extraocular Movements: Extraocular movements intact.  Cardiovascular:     Rate and Rhythm: Normal rate and regular rhythm.     Pulses: Normal pulses.     Heart sounds: Normal heart sounds. No murmur heard. Pulmonary:     Breath sounds: Normal breath sounds. No wheezing or rales.  Musculoskeletal:     Cervical back: Neck supple. No tenderness.     Right lower leg: No edema.     Left lower leg: No edema.  Skin:    General: Skin is warm.     Findings: No rash.  Neurological:     General: No focal deficit present.     Mental Status: He is alert and oriented to person, place, and time.     Sensory: No sensory deficit.     Motor: No weakness.  Psychiatric:        Mood and Affect: Mood normal.        Behavior: Behavior normal.     BP (!) 159/76 (BP Location: Right Arm, Patient Position: Sitting, Cuff  Size: Normal)   Pulse 74   Ht 5\' 7"  (1.702 m)   Wt 160 lb 12.8 oz (72.9 kg)   SpO2 95%   BMI 25.18 kg/m  Wt Readings from Last 3 Encounters:  11/06/23 160 lb 12.8 oz (72.9 kg)  07/04/23 159 lb 9.6 oz (72.4 kg)  04/03/23 161 lb 4.8 oz (73.2 kg)    Lab Results  Component Value Date   TSH 3.700 03/16/2023   Lab Results  Component Value Date   WBC 12.6 (H) 10/31/2022   HGB 16.1 10/31/2022   HCT 48.2 10/31/2022   MCV 96 10/31/2022   PLT 226 10/31/2022   Lab Results  Component Value Date   NA 130 (L) 07/04/2023   K 4.8 07/04/2023   CO2 19 (L) 07/04/2023   GLUCOSE 82 07/04/2023   BUN 12 07/04/2023   CREATININE 1.91 (H) 07/04/2023   BILITOT 1.0 07/04/2023   ALKPHOS 112 07/04/2023   AST 21 07/04/2023   ALT 15 07/04/2023   PROT 6.9 07/04/2023   ALBUMIN 4.4 07/04/2023   CALCIUM 9.5 07/04/2023   EGFR 36 (L) 07/04/2023   Lab Results  Component Value Date   CHOL 161 10/31/2022   Lab Results  Component Value Date   HDL 79 10/31/2022   Lab Results  Component Value Date   LDLCALC 62 10/31/2022   Lab Results  Component Value Date   TRIG 112 10/31/2022   Lab Results  Component Value Date   CHOLHDL 2.0 10/31/2022   Lab Results  Component Value Date   HGBA1C 4.6 (L) 10/31/2022      Assessment & Plan:   Problem List Items Addressed This Visit   None     No orders of the defined types were placed in this encounter.   Follow-up:  No follow-ups on file.    Anabel Halon, MD

## 2023-11-06 NOTE — Assessment & Plan Note (Signed)
Well controlled On Terazosin

## 2023-11-06 NOTE — Assessment & Plan Note (Signed)
Check CMP - recent decrease in GFR likely due to ARB, recheck CMP, if persistent decline, will stop ARB Avoid nephrotoxic agents Has proteinuria - on losartan now Needs to improve hydration

## 2023-11-07 LAB — CMP14+EGFR
ALT: 17 [IU]/L (ref 0–44)
AST: 20 [IU]/L (ref 0–40)
Albumin: 4.6 g/dL (ref 3.8–4.8)
Alkaline Phosphatase: 118 [IU]/L (ref 44–121)
BUN/Creatinine Ratio: 6 — ABNORMAL LOW (ref 10–24)
BUN: 11 mg/dL (ref 8–27)
Bilirubin Total: 0.9 mg/dL (ref 0.0–1.2)
CO2: 18 mmol/L — ABNORMAL LOW (ref 20–29)
Calcium: 9.4 mg/dL (ref 8.6–10.2)
Chloride: 95 mmol/L — ABNORMAL LOW (ref 96–106)
Creatinine, Ser: 1.7 mg/dL — ABNORMAL HIGH (ref 0.76–1.27)
Globulin, Total: 2.3 g/dL (ref 1.5–4.5)
Glucose: 84 mg/dL (ref 70–99)
Potassium: 4.8 mmol/L (ref 3.5–5.2)
Sodium: 129 mmol/L — ABNORMAL LOW (ref 134–144)
Total Protein: 6.9 g/dL (ref 6.0–8.5)
eGFR: 42 mL/min/{1.73_m2} — ABNORMAL LOW (ref >59)

## 2023-11-07 LAB — CBC WITH DIFFERENTIAL/PLATELET
Basophils Absolute: 0.1 10*3/uL (ref 0.0–0.2)
Basos: 1 %
EOS (ABSOLUTE): 0.6 10*3/uL — ABNORMAL HIGH (ref 0.0–0.4)
Eos: 5 %
Hematocrit: 43.7 % (ref 37.5–51.0)
Hemoglobin: 14.7 g/dL (ref 13.0–17.7)
Immature Grans (Abs): 0.1 10*3/uL (ref 0.0–0.1)
Immature Granulocytes: 1 %
Lymphocytes Absolute: 2.4 10*3/uL (ref 0.7–3.1)
Lymphs: 20 %
MCH: 32.5 pg (ref 26.6–33.0)
MCHC: 33.6 g/dL (ref 31.5–35.7)
MCV: 97 fL (ref 79–97)
Monocytes Absolute: 0.9 10*3/uL (ref 0.1–0.9)
Monocytes: 8 %
Neutrophils Absolute: 7.5 10*3/uL — ABNORMAL HIGH (ref 1.4–7.0)
Neutrophils: 65 %
Platelets: 243 10*3/uL (ref 150–450)
RBC: 4.52 x10E6/uL (ref 4.14–5.80)
RDW: 11.3 % — ABNORMAL LOW (ref 11.6–15.4)
WBC: 11.6 10*3/uL — ABNORMAL HIGH (ref 3.4–10.8)

## 2023-11-07 LAB — LIPID PANEL
Chol/HDL Ratio: 1.8 {ratio} (ref 0.0–5.0)
Cholesterol, Total: 151 mg/dL (ref 100–199)
HDL: 84 mg/dL (ref >39)
LDL Chol Calc (NIH): 55 mg/dL (ref 0–99)
Triglycerides: 57 mg/dL (ref 0–149)
VLDL Cholesterol Cal: 12 mg/dL (ref 5–40)

## 2023-11-07 LAB — HEMOGLOBIN A1C
Est. average glucose Bld gHb Est-mCnc: 82 mg/dL
Hgb A1c MFr Bld: 4.5 % — ABNORMAL LOW (ref 4.8–5.6)

## 2023-11-07 LAB — VITAMIN D 25 HYDROXY (VIT D DEFICIENCY, FRACTURES): Vit D, 25-Hydroxy: 19.8 ng/mL — ABNORMAL LOW (ref 30.0–100.0)

## 2023-11-07 LAB — PSA: Prostate Specific Ag, Serum: 2.9 ng/mL (ref 0.0–4.0)

## 2023-11-07 LAB — TSH: TSH: 3.78 u[IU]/mL (ref 0.450–4.500)

## 2023-12-12 ENCOUNTER — Other Ambulatory Visit: Payer: Self-pay | Admitting: Internal Medicine

## 2023-12-12 DIAGNOSIS — I10 Essential (primary) hypertension: Secondary | ICD-10-CM

## 2023-12-12 DIAGNOSIS — B0229 Other postherpetic nervous system involvement: Secondary | ICD-10-CM

## 2023-12-12 DIAGNOSIS — I6523 Occlusion and stenosis of bilateral carotid arteries: Secondary | ICD-10-CM

## 2023-12-12 DIAGNOSIS — N400 Enlarged prostate without lower urinary tract symptoms: Secondary | ICD-10-CM

## 2024-01-14 ENCOUNTER — Other Ambulatory Visit: Payer: Self-pay | Admitting: Internal Medicine

## 2024-01-14 DIAGNOSIS — J302 Other seasonal allergic rhinitis: Secondary | ICD-10-CM

## 2024-02-09 ENCOUNTER — Emergency Department (HOSPITAL_COMMUNITY)

## 2024-02-09 ENCOUNTER — Emergency Department (HOSPITAL_COMMUNITY)
Admission: EM | Admit: 2024-02-09 | Discharge: 2024-02-09 | Disposition: A | Discharge status: Home / Self Care | Attending: Emergency Medicine | Admitting: Emergency Medicine

## 2024-02-09 ENCOUNTER — Other Ambulatory Visit: Payer: Self-pay

## 2024-02-09 ENCOUNTER — Encounter (HOSPITAL_COMMUNITY): Payer: Self-pay | Admitting: Emergency Medicine

## 2024-02-09 DIAGNOSIS — E871 Hypo-osmolality and hyponatremia: Secondary | ICD-10-CM | POA: Diagnosis not present

## 2024-02-09 DIAGNOSIS — I10 Essential (primary) hypertension: Secondary | ICD-10-CM

## 2024-02-09 DIAGNOSIS — Z79899 Other long term (current) drug therapy: Secondary | ICD-10-CM | POA: Insufficient documentation

## 2024-02-09 DIAGNOSIS — B0229 Other postherpetic nervous system involvement: Secondary | ICD-10-CM

## 2024-02-09 DIAGNOSIS — U071 COVID-19: Secondary | ICD-10-CM | POA: Diagnosis present

## 2024-02-09 DIAGNOSIS — I6523 Occlusion and stenosis of bilateral carotid arteries: Secondary | ICD-10-CM

## 2024-02-09 DIAGNOSIS — N179 Acute kidney failure, unspecified: Secondary | ICD-10-CM | POA: Insufficient documentation

## 2024-02-09 DIAGNOSIS — N183 Chronic kidney disease, stage 3 unspecified: Secondary | ICD-10-CM | POA: Diagnosis present

## 2024-02-09 DIAGNOSIS — R059 Cough, unspecified: Secondary | ICD-10-CM | POA: Diagnosis not present

## 2024-02-09 DIAGNOSIS — Z789 Other specified health status: Secondary | ICD-10-CM | POA: Diagnosis present

## 2024-02-09 DIAGNOSIS — F1090 Alcohol use, unspecified, uncomplicated: Secondary | ICD-10-CM | POA: Diagnosis present

## 2024-02-09 DIAGNOSIS — N400 Enlarged prostate without lower urinary tract symptoms: Secondary | ICD-10-CM

## 2024-02-09 DIAGNOSIS — Z720 Tobacco use: Secondary | ICD-10-CM | POA: Diagnosis present

## 2024-02-09 DIAGNOSIS — J432 Centrilobular emphysema: Secondary | ICD-10-CM

## 2024-02-09 DIAGNOSIS — J439 Emphysema, unspecified: Secondary | ICD-10-CM | POA: Diagnosis present

## 2024-02-09 LAB — RESP PANEL BY RT-PCR (RSV, FLU A&B, COVID)  RVPGX2
Influenza A by PCR: NEGATIVE
Influenza B by PCR: NEGATIVE
Resp Syncytial Virus by PCR: NEGATIVE
SARS Coronavirus 2 by RT PCR: POSITIVE — AB

## 2024-02-09 LAB — COMPREHENSIVE METABOLIC PANEL
ALT: 23 U/L (ref 0–44)
AST: 29 U/L (ref 15–41)
Albumin: 3.9 g/dL (ref 3.5–5.0)
Alkaline Phosphatase: 67 U/L (ref 38–126)
Anion gap: 9 (ref 5–15)
BUN: 17 mg/dL (ref 8–23)
CO2: 19 mmol/L — ABNORMAL LOW (ref 22–32)
Calcium: 8.7 mg/dL — ABNORMAL LOW (ref 8.9–10.3)
Chloride: 97 mmol/L — ABNORMAL LOW (ref 98–111)
Creatinine, Ser: 2.08 mg/dL — ABNORMAL HIGH (ref 0.61–1.24)
GFR, Estimated: 33 mL/min — ABNORMAL LOW (ref >60)
Glucose, Bld: 113 mg/dL — ABNORMAL HIGH (ref 70–99)
Potassium: 4.4 mmol/L (ref 3.5–5.1)
Sodium: 125 mmol/L — ABNORMAL LOW (ref 135–145)
Total Bilirubin: 0.9 mg/dL (ref 0.0–1.2)
Total Protein: 6.5 g/dL (ref 6.5–8.1)

## 2024-02-09 LAB — CBC
HCT: 39 % (ref 39.0–52.0)
Hemoglobin: 13.2 g/dL (ref 13.0–17.0)
MCH: 32 pg (ref 26.0–34.0)
MCHC: 33.8 g/dL (ref 30.0–36.0)
MCV: 94.4 fL (ref 80.0–100.0)
Platelets: 145 10*3/uL — ABNORMAL LOW (ref 150–400)
RBC: 4.13 MIL/uL — ABNORMAL LOW (ref 4.22–5.81)
RDW: 11.5 % (ref 11.5–15.5)
WBC: 6.5 10*3/uL (ref 4.0–10.5)
nRBC: 0 % (ref 0.0–0.2)

## 2024-02-09 MED ORDER — AMLODIPINE BESYLATE 10 MG PO TABS: 10.0000 mg | ORAL_TABLET | Freq: Every day | ORAL | 2 refills | Status: DC

## 2024-02-09 MED ORDER — SODIUM CHLORIDE 0.9 % IV BOLUS
1000.0000 mL | Freq: Once | INTRAVENOUS | Status: AC
  Administered 2024-02-09: 1000 mL via INTRAVENOUS

## 2024-02-09 MED ORDER — SODIUM CHLORIDE 0.9 % IV BOLUS: 500.0000 mL | Freq: Once | INTRAVENOUS | Status: DC

## 2024-02-09 MED ORDER — TERAZOSIN HCL 2 MG PO CAPS: 2.0000 mg | ORAL_CAPSULE | Freq: Every day | ORAL | 2 refills | Status: DC

## 2024-02-09 MED ORDER — ACETAMINOPHEN 325 MG PO TABS: 650.0000 mg | ORAL_TABLET | ORAL | 2 refills | Status: AC | PRN

## 2024-02-09 MED ORDER — ONDANSETRON HCL 4 MG/2ML IJ SOLN
4.0000 mg | Freq: Once | INTRAMUSCULAR | Status: AC
  Administered 2024-02-09: 4 mg via INTRAVENOUS
  Filled 2024-02-09: qty 2

## 2024-02-09 MED ORDER — LOPERAMIDE HCL 2 MG PO CAPS
2.0000 mg | ORAL_CAPSULE | Freq: Once | ORAL | Status: AC
  Administered 2024-02-09: 2 mg via ORAL
  Filled 2024-02-09: qty 1

## 2024-02-09 MED ORDER — ALBUTEROL SULFATE HFA 108 (90 BASE) MCG/ACT IN AERS: 2.0000 | INHALATION_SPRAY | RESPIRATORY_TRACT | 2 refills | Status: AC | PRN

## 2024-02-09 MED ORDER — ONDANSETRON HCL 4 MG PO TABS: 4.0000 mg | ORAL_TABLET | Freq: Every day | ORAL | 1 refills | Status: DC | PRN

## 2024-02-09 MED ORDER — SODIUM CHLORIDE 0.9 % IV SOLN: INTRAVENOUS | Status: DC

## 2024-02-09 MED ORDER — LOPERAMIDE HCL 2 MG PO CAPS: 2.0000 mg | ORAL_CAPSULE | Freq: Three times a day (TID) | ORAL | 0 refills | Status: DC | PRN

## 2024-02-09 MED ORDER — ATORVASTATIN CALCIUM 40 MG PO TABS: 40.0000 mg | ORAL_TABLET | Freq: Every day | ORAL | 2 refills | Status: DC

## 2024-02-09 MED ORDER — ONDANSETRON HCL 4 MG/2ML IJ SOLN: 4.0000 mg | Freq: Once | INTRAMUSCULAR | Status: DC

## 2024-02-09 MED ORDER — GABAPENTIN 300 MG PO CAPS: 300.0000 mg | ORAL_CAPSULE | Freq: Every day | ORAL | 2 refills | Status: DC

## 2024-02-09 NOTE — ED Provider Notes (Signed)
 [place] EMERGENCY DEPARTMENT AT [facility] Provider Note   CSN: [id number] Arrival date & time: 02/09/24  9518     History  Chief Complaint  Patient presents with   Weakness   Cough    Eddie [name] is a 78 y.o. male.  Patient is a 78 year old male who presents emergency department with his wife with a chief complaint of cough, generalized malaise and fatigue, chills, diarrhea which has been ongoing since yesterday.  Wife notes that she was sick with similar symptoms though her symptoms have resolved.  He denies any associated vomiting but has had some associated nausea.  He does admit to lack of appetite.  He has had no associated headaches, pain to neck or back.  He denies any abnormal rashes.   Weakness Associated symptoms: cough   Cough      Home Medications Prior to Admission medications   Medication Sig Start Date End Date Taking? Authorizing Provider  albuterol (VENTOLIN HFA) 108 (90 Base) MCG/ACT inhaler Inhale 2 puffs into the lungs every 6 (six) hours as needed for wheezing or shortness of breath. 03/03/23   Anabel Halon, MD  amLODipine (NORVASC) 10 MG tablet TAKE 1 TABLET BY MOUTH ONCE  DAILY 12/13/23   Anabel Halon, MD  atorvastatin (LIPITOR) 40 MG tablet TAKE 1 TABLET BY MOUTH ONCE  DAILY 12/13/23   Anabel Halon, MD  azelastine (ASTELIN) 0.1 % nasal spray PLACE 2 SPRAYS INTO BOTH NOSTRILS 2 TIMES A DAY 01/15/24   Anabel Halon, MD  gabapentin (NEURONTIN) 300 MG capsule TAKE 2 CAPSULES BY MOUTH AT  BEDTIME 12/13/23   Anabel Halon, MD  losartan (COZAAR) 25 MG tablet TAKE (1) TABLET BY MOUTH ONCE DAILY. 08/31/23   Anabel Halon, MD  terazosin (HYTRIN) 2 MG capsule TAKE 1 CAPSULE BY MOUTH AT  BEDTIME 12/13/23   Anabel Halon, MD  vitamin B-12 (CYANOCOBALAMIN) 500 MCG tablet Take 500 mcg by mouth in the morning.    [provider]      Allergies    Motrin [ibuprofen]    Review of Systems   Review of Systems  Respiratory:  Positive for  cough.   Neurological:  Positive for weakness.  All other systems reviewed and are negative.   Physical Exam Updated Vital Signs BP 138/70 (BP Location: Right Arm)   Pulse 88   Temp 99.2 F (37.3 C) (Oral)   Resp 18   Ht 5\' 7"  (1.702 m)   Wt 73 kg   SpO2 97%   BMI 25.22 kg/m  Physical Exam Vitals and nursing note reviewed.  Constitutional:      Appearance: Normal appearance.  HENT:     Head: Normocephalic and atraumatic.     Nose: Nose normal.     Mouth/Throat:     Mouth: Mucous membranes are moist.  Eyes:     Extraocular Movements: Extraocular movements intact.     Conjunctiva/sclera: Conjunctivae normal.     Pupils: Pupils are equal, round, and reactive to light.  Cardiovascular:     Rate and Rhythm: Normal rate and regular rhythm.     Pulses: Normal pulses.     Heart sounds: Normal heart sounds.  Pulmonary:     Effort: Pulmonary effort is normal. No respiratory distress.     Breath sounds: Normal breath sounds. No stridor. No wheezing, rhonchi or rales.  Abdominal:     General: Abdomen is flat. Bowel sounds are normal. There is no distension.  Palpations: Abdomen is soft.     Tenderness: There is no abdominal tenderness. There is no guarding.  Musculoskeletal:        General: Normal range of motion.     Cervical back: Normal range of motion and neck supple.     Right lower leg: No edema.     Left lower leg: No edema.  Skin:    General: Skin is warm and dry.     Findings: No rash.  Neurological:     General: No focal deficit present.     Mental Status: He is alert and oriented to person, place, and time. Mental status is at baseline.     Cranial Nerves: No cranial nerve deficit.     Sensory: No sensory deficit.     Motor: No weakness.     Coordination: Coordination normal.     Gait: Gait normal.  Psychiatric:        Mood and Affect: Mood normal.        Behavior: Behavior normal.        Thought Content: Thought content normal.        Judgment: Judgment  normal.     ED Results / Procedures / Treatments   Labs (all labs ordered are listed, but only abnormal results are displayed) Labs Reviewed  RESP PANEL BY RT-PCR (RSV, FLU A&B, COVID)  [id number] - Abnormal; Notable for the following components:      Result Value   SARS Coronavirus 2 by RT PCR POSITIVE (*)    All other components within normal limits  CBC - Abnormal; Notable for the following components:   RBC 4.13 (*)    Platelets 145 (*)    All other components within normal limits  COMPREHENSIVE METABOLIC PANEL - Abnormal; Notable for the following components:   Sodium 125 (*)    Chloride 97 (*)    CO2 19 (*)    Glucose, Bld 113 (*)    Creatinine, Ser 2.08 (*)    Calcium 8.7 (*)    GFR, Estimated 33 (*)    All other components within normal limits    EKG None  Radiology No results found.  Procedures Procedures    Medications Ordered in ED Medications  sodium chloride 0.9 % bolus 500 mL (has no administration in time range)  0.9 %  sodium chloride infusion (has no administration in time range)    ED Course/ Medical Decision Making/ A&P                                 Medical Decision Making Amount and/or Complexity of Data Reviewed Labs: ordered. Radiology: ordered.  Risk Prescription drug management.   This patient presents to the ED for concern of weakness, cough, congestion, diarrhea differential diagnosis includes acute viral syndrome, diverticulitis, colitis, pneumonia, sepsis    Additional history obtained:  Additional history obtained from spouse External records from outside source obtained and reviewed including none   Lab Tests:  I Ordered, and personally interpreted labs.  The pertinent results include: COVID-19 positive, acute on chronic kidney injury, hyponatremia   Imaging Studies ordered:  I ordered imaging studies including chest x-ray  I independently visualized and interpreted imaging which showed no acute cardiopulmonary  process I agree with the radiologist interpretation   Medicines ordered and prescription drug management:  I ordered medication including IV fluids for hyponatremia Reevaluation of the patient after these medicines showed that the  patient improved I have reviewed the patients home medicines and have made adjustments as needed   Problem List / ED Course:  Patient is doing well at this time.  Patient case was initially discussed with Dr. Mariea Clonts with the hospitalist service.  He stated he does not feel the patient needs to be admitted at this time as he has no associated hypoxia.  Hospitalist did order additional IV fluids and did write the patient to be discharged home on continued medications and symptomatic treatment.  Patient had a chest x-ray which demonstrated no signs of acute consolidation.  Abdominal exam was benign with no focal tenderness throughout.  Do not suspect an acute intra-abdominal surgical pathology at this time.  Patient was discharged home by hospitalist service.   Social Determinants of Health:  None           Final Clinical Impression(s) / ED Diagnoses Final diagnoses:  None    Rx / DC Orders ED Discharge Orders     None         Kathlen Mody 02/09/24 1454    Bethann Berkshire, MD 02/11/24 1158

## 2024-02-09 NOTE — Consult Note (Signed)
 Initial Consultation Note   Patient: Eddie [name]:[id number] DOB: 1946-03-19 PCP: Eddie Halon, MD DOA: 02/09/2024 DOS: the patient was seen and examined on 02/09/2024 Primary service: Eddie Berkshire, MD  Referring physician: Estell [name] Reason for consult: Covid 19 infection  Assessment/Plan: Assessment and Plan: 1)Covid 19 Infection--- renal function (creatinine clearance is only 28) precludes use of Paxlovid at this time -Patient without Pneumonia on chest x-ray, no hypoxia -Hold off on steroids -Patient presented with cough x 2 days, generalized weakness, poor appetite and diarrhea as well as nasal congestion----Suspect these are COVID-related symptoms -Symptomatic and supportive treatment advised  2)Tobacco Abuse--smoking cessation advised may use OTC nicotine patch  3)Alcohol Abuse--patient drinks up to 6 beers per day moderation and eventual cessation of excessive beer intake advised  4)Acute Thrombocytopenia--- platelet count 145 k, suspect related to viral illness and compounded by ongoing alcohol abuse  5)Acute on chronic Hyponatremia--suspect some component of beer potomania, compounded by dehydration in the setting of poor oral intake -Sodium is down to 125 it was 129 on 11/06/2023 -Review of record reveals consistently low sodiums for the last 11 months -Patient received IV fluids in the ED -Zofran given and increase oral intake advised -Abstinence from [facility] also advised  6)CKD stage -3B    - creatinine is 2.0,  creatinine was 1.91 on 07/04/2023 -Creatinine is currently not far from baseline -Patient received IV fluids in the ED,  -encouraged to drink more fluids  renally adjust medications, avoid nephrotoxic agents / dehydration  / hypotension   Discharge Instructions---- 1)Avoid ibuprofen/Advil/Aleve/Motrin/Goody Powders/Naproxen/BC powders/Meloxicam/Diclofenac/Indomethacin and other Nonsteroidal anti-inflammatory medications as these will make you more likely to  bleed and can cause stomach ulcers, can also cause Kidney problems.   2)Drink plenty fluids and avoid dehydration  3)Complete Abstinence from Beer/alcohol advised  4)Smoking cessation advised--- okay to use over-the-counter nicotine patch to help you quit smoking  5)Repeat CBC and  BMP blood test with primary care physician in about 10 days advised  6)You are strongly advised to isolate/quarantine for at least 10 days from the date of your diagnosis with COVID-19 infection--please always wear a mask if you have to go outside the house  7)Please take medications as prescribed--- please note that there were some changes to your medications  Discharge Instructions     Call MD for:  difficulty breathing, headache or visual disturbances   Complete by: As directed    Call MD for:  persistant dizziness or light-headedness   Complete by: As directed    Call MD for:  persistant nausea and vomiting   Complete by: As directed    Call MD for:  temperature >100.4   Complete by: As directed    Diet general   Complete by: As directed    Discharge instructions   Complete by: As directed    1)Avoid ibuprofen/Advil/Aleve/Motrin/Goody Powders/Naproxen/BC powders/Meloxicam/Diclofenac/Indomethacin and other Nonsteroidal anti-inflammatory medications as these will make you more likely to bleed and can cause stomach ulcers, can also cause Kidney problems.   2)Drink plenty fluids and avoid dehydration  3)Complete Abstinence from Beer/alcohol advised  4)Smoking cessation advised--- okay to use over-the-counter nicotine patch to help you quit smoking  5)Repeat CBC and  BMP blood test with primary care physician in about 10 days advised  6)You are strongly advised to isolate/quarantine for at least 10 days from the date of your diagnosis with COVID-19 infection--please always wear a mask if you have to go outside the house  7)Please take medications as prescribed--- please  note that there were some  changes to your medications   Increase activity slowly   Complete by: As directed        Allergies as of 02/09/2024       Reactions   Motrin [ibuprofen] Swelling        Medication List     STOP taking these medications    NYQUIL COLD & FLU PO       TAKE these medications    acetaminophen 325 MG tablet Commonly known as: Tylenol Take 2 tablets (650 mg total) by mouth every 4 (four) hours as needed. What changed:  medication strength how much to take when to take this reasons to take this   albuterol 108 (90 Base) MCG/ACT inhaler Commonly known as: VENTOLIN HFA Inhale 2 puffs into the lungs every 4 (four) hours as needed for wheezing or shortness of breath. What changed: when to take this   amLODipine 10 MG tablet Commonly known as: NORVASC Take 1 tablet (10 mg total) by mouth daily.   atorvastatin 40 MG tablet Commonly known as: LIPITOR Take 1 tablet (40 mg total) by mouth daily.   azelastine 0.1 % nasal spray Commonly known as: ASTELIN PLACE 2 SPRAYS INTO BOTH NOSTRILS 2 TIMES A DAY What changed: See the new instructions.   cyanocobalamin 500 MCG tablet Commonly known as: VITAMIN B12 Take 500 mcg by mouth in the morning.   gabapentin 300 MG capsule Commonly known as: NEURONTIN Take 1 capsule (300 mg total) by mouth at bedtime. What changed: how much to take   loperamide 2 MG capsule Commonly known as: IMODIUM Take 1 capsule (2 mg total) by mouth every 8 (eight) hours as needed for diarrhea or loose stools (Diarrhea).   losartan 25 MG tablet Commonly known as: COZAAR TAKE (1) TABLET BY MOUTH ONCE DAILY.   ondansetron 4 MG tablet Commonly known as: Zofran Take 1 tablet (4 mg total) by mouth daily as needed for nausea or vomiting.   terazosin 2 MG capsule Commonly known as: HYTRIN Take 1 capsule (2 mg total) by mouth at bedtime.        TRH will sign off at present, please call us again when needed.  HPI: Eddie [name] is a 78 y.o. male  with past medical history of alcohol abuse, GERD, tobacco abuse chronic hyponatremia partly related to beer potomania, history of esophageal malignancy -Pt reports weakness and a cough for 2 days. PT reported cold chills lasting about 1 hour. Spouse states she was feeling the same for a couple of days but since has passed. Pt reports diarrhea and a runny nose for the last couple days -No fevers and no significant dyspnea -No vomiting -Admits to ongoing tobacco and alcohol use especially Beer - In ED--- COVID-positive, flu and RSV negative - chest x-ray without acute findings -Platelets down to 145, hemoglobin 13.3 with WBC of 6.5 -Sodium is down to 125 it was 129 on 11/06/2023 -Potassium is 4.4 chloride 97 bicarb 19 it was 18 on 11/06/2023, glucose is 113 - creatinine is 2.0 creatinine was 1.91 on 07/04/2023 -LFTs are not elevated  Review of Systems: As mentioned in the history of present illness. All other systems reviewed and are negative. Past Medical History:  Diagnosis Date   Chronic back pain    Duodenal ulcer 09/2012   GERD (gastroesophageal reflux disease)    Helicobacter pylori (H. pylori) infection 09/2012   Hypertension    Severe malnutrition (HCC) 10/26/2012   Squamous cell carcinoma  Oct 2014   Esophogus   Squamous cell carcinoma of esophagus (HCC) 09/25/2012   Past Surgical History:  Procedure Laterality Date   COLONOSCOPY N/A 12/26/2013   Procedure: COLONOSCOPY;  Surgeon: Malissa Hippo, MD;  Location: [facility];  Service: Endoscopy;  Laterality: N/A;  830   COLONOSCOPY N/A 03/25/2021   Procedure: COLONOSCOPY;  Surgeon: Malissa Hippo, MD;  Location: [facility];  Service: Endoscopy;  Laterality: N/A;  AM   ESOPHAGOGASTRODUODENOSCOPY  09/21/2012   Procedure: ESOPHAGOGASTRODUODENOSCOPY (EGD);  Surgeon: Malissa Hippo, MD;  Location: [facility];  Service: Endoscopy;  Laterality: N/A;  345   PEG PLACEMENT  09/21/2012   Procedure: PERCUTANEOUS ENDOSCOPIC  GASTROSTOMY (PEG) PLACEMENT;  Surgeon: Malissa Hippo, MD;  Location: [facility];  Service: Endoscopy;  Laterality: N/A;   PEG TUBE PLACEMENT  09/21/2012   PORTACATH PLACEMENT     REMOVAL OF GASTROSTOMY TUBE     august 2014   Social History:  reports that he has been smoking cigarettes. He has a 50 pack-year smoking history. He has been exposed to tobacco smoke. He has never used smokeless tobacco. He reports current alcohol use of about 6.0 standard drinks of alcohol per week. He reports that he does not use drugs.  Allergies  Allergen Reactions   Motrin [Ibuprofen] Swelling    Family History  Problem Relation Age of Onset   Stroke Mother    Hypertension Father    Cancer Brother        Throat cancer   Kidney disease Daughter     Prior to Admission medications   Medication Sig Start Date End Date Taking? Authorizing Provider  acetaminophen (TYLENOL) 325 MG tablet Take 2 tablets (650 mg total) by mouth every 4 (four) hours as needed. 02/09/24 02/08/25 Yes Kenita Bines, MD  azelastine (ASTELIN) 0.1 % nasal spray PLACE 2 SPRAYS INTO BOTH NOSTRILS 2 TIMES A DAY Patient taking differently: Place 2 sprays into both nostrils 2 (two) times daily as needed for allergies or rhinitis. 01/15/24  Yes Eddie Halon, MD  loperamide (IMODIUM) 2 MG capsule Take 1 capsule (2 mg total) by mouth every 8 (eight) hours as needed for diarrhea or loose stools (Diarrhea). 02/09/24  Yes Nickolus Wadding, MD  losartan (COZAAR) 25 MG tablet TAKE (1) TABLET BY MOUTH ONCE DAILY. 08/31/23  Yes Eddie Halon, MD  ondansetron (ZOFRAN) 4 MG tablet Take 1 tablet (4 mg total) by mouth daily as needed for nausea or vomiting. 02/09/24 02/08/25 Yes Jearldean Gutt, MD  vitamin B-12 (CYANOCOBALAMIN) 500 MCG tablet Take 500 mcg by mouth in the morning.   Yes [provider]  albuterol (VENTOLIN HFA) 108 (90 Base) MCG/ACT inhaler Inhale 2 puffs into the lungs every 4 (four) hours as needed for wheezing or shortness  of breath. 02/09/24   Shon Hale, MD  amLODipine (NORVASC) 10 MG tablet Take 1 tablet (10 mg total) by mouth daily. 02/09/24   Shon Hale, MD  atorvastatin (LIPITOR) 40 MG tablet Take 1 tablet (40 mg total) by mouth daily. 02/09/24   Shon Hale, MD  gabapentin (NEURONTIN) 300 MG capsule Take 1 capsule (300 mg total) by mouth at bedtime. 02/09/24   Shon Hale, MD  terazosin (HYTRIN) 2 MG capsule Take 1 capsule (2 mg total) by mouth at bedtime. 02/09/24   Shon Hale, MD    Physical Exam: Vitals:   02/09/24 0903 02/09/24 0906 02/09/24 1230  BP:  138/70 (!) 140/76  Pulse:  88  79  Resp:  18 16  Temp:  99.2 F (37.3 C)   TempSrc:  Oral   SpO2:  97% 95%  Weight: 73 kg    Height: 5\' 7"  (1.702 m)      Physical Exam  Gen:- Awake Alert, in no acute distress  HEENT:- [place].AT, No sclera icterus Neck-Supple Neck,No JVD,.  Lungs-  CTAB , fair air movement bilaterally  CV- S1, S2 normal, RRR Abd-  +ve B.Sounds, Abd Soft, No tenderness,    Extremity/Skin:- No  edema,   good pedal pulses  Psych-affect is appropriate, oriented x3 Neuro-no new focal deficits, no tremors  Data Reviewed:  In ED--- COVID-positive, flu and RSV negative - chest x-ray without acute findings -Platelets down to 145, hemoglobin 13.3 with WBC of 6.5 -Sodium is down to 125 it was 129 on 11/06/2023 -Potassium is 4.4 chloride 97 bicarb 19 it was 18 on 11/06/2023, glucose is 113 - creatinine is 2.0 creatinine was 1.91 on 07/04/2023 -LFTs are not elevated  Family Communication: wife primary contact Primary team communication: ED provider notified Thank you very much for involving [place] in the care of your patient.  Author: Shon Hale, MD 02/09/2024 1:55 PM  For on call review [email or web address].

## 2024-02-09 NOTE — ED Triage Notes (Signed)
 Pt reports weakness and a cough for 2 days. PT reported cold chills lasting about 1 hour. Spouse states she was feeling the same for a couple of days but since has passed. Pt reports diarrhea and a runny nose.

## 2024-02-09 NOTE — ED Notes (Signed)
 Patient discharged. Provider spoke to patient. Paperwork given to patient and reviewed. Pt verbalized understanding. VSS. A+Ox4. Patient ambulated out of the ER with steady, independent gait. IV removed intact without complications.

## 2024-02-09 NOTE — ED Notes (Signed)
 X-ray at bedside

## 2024-02-13 ENCOUNTER — Telehealth: Payer: Self-pay

## 2024-02-13 NOTE — Transitions of Care (Post Inpatient/ED Visit) (Signed)
 02/13/2024  Name: Eddie [name] MRN: [id number] DOB: 1946-03-08  Today's TOC FU Call Status: Today's TOC FU Call Status:: Successful TOC FU Call Completed TOC FU Call Complete Date: 02/13/24 Patient's Name and Date of Birth confirmed.  Transition Care Management Follow-up Telephone Call Date of Discharge: 02/09/24 [facility] Type of Discharge: Emergency Department Reason for ED Visit: Other: (COVID) How have you been since you were released from the hospital?: Better Any questions or concerns?: No  Items Reviewed: Did you receive and understand the discharge instructions provided?: Yes Medications obtained,verified, and reconciled?: Yes (Medications Reviewed) Any new allergies since your discharge?: No Dietary orders reviewed?: Yes Do you have support at home?: Yes People in Home: spouse  Medications Reviewed Today: Medications Reviewed Today     Reviewed by Karena Addison, LPN (Licensed Practical Nurse) on 02/13/24 at 1441  Med List Status: <None>   Medication Order Taking? Sig Documenting Provider Last Dose Status Informant  acetaminophen (TYLENOL) 325 MG tablet [id number]  Take 2 tablets (650 mg total) by mouth every 4 (four) hours as needed. Shon Hale, MD  Active   albuterol (VENTOLIN HFA) 108 (90 Base) MCG/ACT inhaler [id number]  Inhale 2 puffs into the lungs every 4 (four) hours as needed for wheezing or shortness of breath. Shon Hale, MD  Active   amLODipine (NORVASC) 10 MG tablet [id number]  Take 1 tablet (10 mg total) by mouth daily. Shon Hale, MD  Active   atorvastatin (LIPITOR) 40 MG tablet [id number]  Take 1 tablet (40 mg total) by mouth daily. Shon Hale, MD  Active   azelastine (ASTELIN) 0.1 % nasal spray [id number] No PLACE 2 SPRAYS INTO BOTH NOSTRILS 2 TIMES A DAY  Patient taking differently: Place 2 sprays into both nostrils 2 (two) times daily as needed for allergies or rhinitis.   Anabel Halon, MD 02/08/2024  Active Self, Pharmacy Records  gabapentin (NEURONTIN) 300 MG capsule [id number]  Take 1 capsule (300 mg total) by mouth at bedtime. Shon Hale, MD  Active   loperamide (IMODIUM) 2 MG capsule [id number]  Take 1 capsule (2 mg total) by mouth every 8 (eight) hours as needed for diarrhea or loose stools (Diarrhea). Shon Hale, MD  Active   losartan (COZAAR) 25 MG tablet [id number] No TAKE (1) TABLET BY MOUTH ONCE DAILY. Anabel Halon, MD 02/09/2024 Active Self, Pharmacy Records  ondansetron [facility]) 4 MG tablet [id number]  Take 1 tablet (4 mg total) by mouth daily as needed for nausea or vomiting. Shon Hale, MD  Active   terazosin (HYTRIN) 2 MG capsule [id number]  Take 1 capsule (2 mg total) by mouth at bedtime. Shon Hale, MD  Active   vitamin B-12 (CYANOCOBALAMIN) 500 MCG tablet [id number] No Take 500 mcg by mouth in the morning. [provider] 02/08/2024 Active Self, Pharmacy Records            Home Care and Equipment/Supplies: Were Home Health Services Ordered?: NA Any new equipment or medical supplies ordered?: NA  Functional Questionnaire: Do you need assistance with bathing/showering or dressing?: No Do you need assistance with meal preparation?: No Do you need assistance with eating?: No Do you have difficulty maintaining continence: No Do you need assistance with getting out of bed/getting out of a chair/moving?: No Do you have difficulty managing or taking your medications?: No  Follow up appointments reviewed: PCP Follow-up appointment confirmed?: No (declined appt) MD Provider Line [phone] Given: No Specialist Hospital Follow-up appointment confirmed?: NA Do you need  transportation to your follow-up appointment?: No Do you understand care options if your condition(s) worsen?: Yes-patient verbalized understanding    SIGNATURE Karena Addison, LPN [facility] Nurse Health Advisor Direct Dial [phone]

## 2024-02-18 ENCOUNTER — Other Ambulatory Visit: Payer: Self-pay | Admitting: Internal Medicine

## 2024-02-18 DIAGNOSIS — I10 Essential (primary) hypertension: Secondary | ICD-10-CM

## 2024-03-06 ENCOUNTER — Ambulatory Visit: Payer: Medicare Other | Admitting: Internal Medicine

## 2024-04-02 ENCOUNTER — Ambulatory Visit (INDEPENDENT_AMBULATORY_CARE_PROVIDER_SITE_OTHER): Payer: Medicare Other | Admitting: Gastroenterology

## 2024-04-21 ENCOUNTER — Other Ambulatory Visit: Payer: Self-pay | Admitting: Internal Medicine

## 2024-04-21 DIAGNOSIS — J302 Other seasonal allergic rhinitis: Secondary | ICD-10-CM

## 2024-05-15 ENCOUNTER — Encounter: Payer: Self-pay | Admitting: Internal Medicine

## 2024-05-15 ENCOUNTER — Ambulatory Visit (INDEPENDENT_AMBULATORY_CARE_PROVIDER_SITE_OTHER): Admitting: Internal Medicine

## 2024-05-15 VITALS — BP 138/72 | HR 76 | Ht 67.0 in | Wt 157.8 lb

## 2024-05-15 DIAGNOSIS — J432 Centrilobular emphysema: Secondary | ICD-10-CM | POA: Diagnosis not present

## 2024-05-15 DIAGNOSIS — N401 Enlarged prostate with lower urinary tract symptoms: Secondary | ICD-10-CM

## 2024-05-15 DIAGNOSIS — E782 Mixed hyperlipidemia: Secondary | ICD-10-CM

## 2024-05-15 DIAGNOSIS — R351 Nocturia: Secondary | ICD-10-CM

## 2024-05-15 DIAGNOSIS — Z720 Tobacco use: Secondary | ICD-10-CM | POA: Diagnosis not present

## 2024-05-15 DIAGNOSIS — N1832 Chronic kidney disease, stage 3b: Secondary | ICD-10-CM

## 2024-05-15 DIAGNOSIS — B0229 Other postherpetic nervous system involvement: Secondary | ICD-10-CM

## 2024-05-15 DIAGNOSIS — I700 Atherosclerosis of aorta: Secondary | ICD-10-CM

## 2024-05-15 DIAGNOSIS — I10 Essential (primary) hypertension: Secondary | ICD-10-CM

## 2024-05-15 MED ORDER — AMLODIPINE BESYLATE 10 MG PO TABS: 10.0000 mg | ORAL_TABLET | Freq: Every day | ORAL | 2 refills | Status: AC

## 2024-05-15 MED ORDER — TERAZOSIN HCL 2 MG PO CAPS: 2.0000 mg | ORAL_CAPSULE | Freq: Every day | ORAL | 2 refills | Status: AC

## 2024-05-15 MED ORDER — GABAPENTIN 300 MG PO CAPS: 300.0000 mg | ORAL_CAPSULE | Freq: Every day | ORAL | 2 refills | Status: AC

## 2024-05-15 MED ORDER — ATORVASTATIN CALCIUM 40 MG PO TABS: 40.0000 mg | ORAL_TABLET | Freq: Every day | ORAL | 2 refills | Status: AC

## 2024-05-15 NOTE — Progress Notes (Signed)
 Established Patient Office Visit  Subjective:  Patient ID: Eddie [name], male    DOB: [birth year]  Age: 78 y.o. MRN: [id number]  CC:  Chief Complaint  Patient presents with   Medical Management of Chronic Issues    4 month f/u    HPI Eddie [name] is a 78 y.o. male with past medical history of HTN, esophageal ca. s/p chemoradiation, COPD, postherpetic neuralgia, CKD and colonic polyps who presents for f/u of his chronic medical conditions.  BP is wnl today. Takes medications regularly. Patient denies headache, dizziness, chest pain, or palpitations.  He has CKD stage 3a.  His GFR had worsened compared to prior during the ER visit in 03/25, but he had diarrhea due to viral infection at that time. Of note, he was recently placed on losartan , which had caused decline in GFR initially as well.  Denies any dysuria, hematuria, urinary resistance or hesitance. Does report nocturia.  He had proteinuria on last urine test.  He has cut down smoking to half pack per day now. He was not able to tolerate Chantix  due to nightmares.  He complains of mild dyspnea upon exertion, but has felt better with albuterol  inhaler as needed, has not required it very frequently.  Denies any fever, chills or wheezing currently.  He uses azelastine  nasal spray for allergic rhinitis.   Past Medical History:  Diagnosis Date   Chronic back pain    Duodenal ulcer 09/2012   GERD (gastroesophageal reflux disease)    Helicobacter pylori (H. pylori) infection 09/2012   Hypertension    Severe malnutrition (HCC) 10/26/2012   Squamous cell carcinoma Oct 2014   Esophogus   Squamous cell carcinoma of esophagus (HCC) 09/25/2012    Past Surgical History:  Procedure Laterality Date   COLONOSCOPY N/A 12/26/2013   Procedure: COLONOSCOPY;  Surgeon: Ruby Corporal, MD;  Location: [facility];  Service: Endoscopy;  Laterality: N/A;  830   COLONOSCOPY N/A 03/25/2021   Procedure: COLONOSCOPY;  Surgeon: Ruby Corporal, MD;   Location: [facility];  Service: Endoscopy;  Laterality: N/A;  AM   ESOPHAGOGASTRODUODENOSCOPY  09/21/2012   Procedure: ESOPHAGOGASTRODUODENOSCOPY (EGD);  Surgeon: Ruby Corporal, MD;  Location: [facility];  Service: Endoscopy;  Laterality: N/A;  345   PEG PLACEMENT  09/21/2012   Procedure: PERCUTANEOUS ENDOSCOPIC GASTROSTOMY (PEG) PLACEMENT;  Surgeon: Ruby Corporal, MD;  Location: [facility];  Service: Endoscopy;  Laterality: N/A;   PEG TUBE PLACEMENT  09/21/2012   PORTACATH PLACEMENT     REMOVAL OF GASTROSTOMY TUBE     august 2014    Family History  Problem Relation Age of Onset   Stroke Mother    Hypertension Father    Cancer Brother        Throat cancer   Kidney disease Daughter     Social History   Socioeconomic History   Marital status: Widowed    Spouse name: Not on file   Number of children: 4   Years of education: 10   Highest education level: Not on file  Occupational History   Not on file  Tobacco Use   Smoking status: Every Day    Current packs/day: 1.00    Average packs/day: 1 pack/day for 50.0 years (50.0 ttl pk-yrs)    Types: Cigarettes    Passive exposure: Current   Smokeless tobacco: Never   Tobacco comments:    1/2 to 1 pack x50 yrs.  Substance and Sexual Activity  Alcohol use: Yes    Alcohol/week: 6.0 standard drinks of alcohol    Types: 6 Cans of beer per week    Comment: Patient states he drinks a couple of beers almost daily in the afternoon,   Drug use: No   Sexual activity: Not on file  Other Topics Concern   Not on file  Social History Narrative   Patient lives with friend has 4 children   Social Drivers of Corporate investment banker Strain: Low Risk  (11/15/2022)   Overall Financial Resource Strain (CARDIA)    Difficulty of Paying Living Expenses: Not hard at all  Food Insecurity: No Food Insecurity (11/15/2022)   Hunger Vital Sign    Worried About Running Out of Food in the Last Year: Never true    Ran Out of Food in  the Last Year: Never true  Transportation Needs: No Transportation Needs (11/15/2022)   PRAPARE - Administrator, Civil Service (Medical): No    Lack of Transportation (Non-Medical): No  Physical Activity: Sufficiently Active (11/15/2022)   Exercise Vital Sign    Days of Exercise per Week: 7 days    Minutes of Exercise per Session: 50 min  Stress: No Stress Concern Present (11/15/2022)   [employer] of Occupational Health - Occupational Stress Questionnaire    Feeling of Stress : Not at all  Social Connections: Socially Isolated (11/15/2022)   Social Connection and Isolation Panel [NHANES]    Frequency of Communication with Friends and Family: Three times a week    Frequency of Social Gatherings with Friends and Family: Three times a week    Attends Religious Services: Never    Active Member of Clubs or Organizations: No    Attends Banker Meetings: Never    Marital Status: Widowed  Intimate Partner Violence: Not At Risk (11/15/2022)   Humiliation, Afraid, Rape, and Kick questionnaire    Fear of Current or Ex-Partner: No    Emotionally Abused: No    Physically Abused: No    Sexually Abused: No    Outpatient Medications Prior to Visit  Medication Sig Dispense Refill   acetaminophen  (TYLENOL ) 325 MG tablet Take 2 tablets (650 mg total) by mouth every 4 (four) hours as needed. 100 tablet 2   albuterol  (VENTOLIN  HFA) 108 (90 Base) MCG/ACT inhaler Inhale 2 puffs into the lungs every 4 (four) hours as needed for wheezing or shortness of breath. 18 g 2   azelastine  (ASTELIN ) 0.1 % nasal spray PLACE 2 SPRAYS INTO BOTH NOSTRILS 2 TIMES A DAY 30 mL 2   losartan  (COZAAR ) 25 MG tablet TAKE (1) TABLET BY MOUTH ONCE DAILY. 90 tablet 1   vitamin B-12 (CYANOCOBALAMIN) 500 MCG tablet Take 500 mcg by mouth in the morning.     amLODipine  (NORVASC ) 10 MG tablet Take 1 tablet (10 mg total) by mouth daily. 90 tablet 2   atorvastatin  (LIPITOR) 40 MG tablet Take 1 tablet (40  mg total) by mouth daily. 90 tablet 2   gabapentin  (NEURONTIN ) 300 MG capsule Take 1 capsule (300 mg total) by mouth at bedtime. 90 capsule 2   loperamide  (IMODIUM ) 2 MG capsule Take 1 capsule (2 mg total) by mouth every 8 (eight) hours as needed for diarrhea or loose stools (Diarrhea). 15 capsule 0   ondansetron  (ZOFRAN ) 4 MG tablet Take 1 tablet (4 mg total) by mouth daily as needed for nausea or vomiting. 15 tablet 1   terazosin  (HYTRIN ) 2 MG capsule Take 1  capsule (2 mg total) by mouth at bedtime. 90 capsule 2   No facility-administered medications prior to visit.    Allergies  Allergen Reactions   Motrin [Ibuprofen] Swelling    ROS Review of Systems  Constitutional:  Negative for chills and fever.  HENT:  Positive for congestion. Negative for postnasal drip and sore throat.   Eyes:  Negative for pain and discharge.  Respiratory:  Positive for shortness of breath (Intermittent). Negative for cough.   Cardiovascular:  Negative for chest pain and palpitations.  Gastrointestinal:  Negative for diarrhea, nausea and vomiting.  Endocrine: Negative for polydipsia and polyuria.  Genitourinary:  Negative for dysuria and hematuria.  Musculoskeletal:  Negative for neck pain and neck stiffness.  Skin:  Negative for rash.  Neurological:  Negative for dizziness, weakness, numbness and headaches.  Psychiatric/Behavioral:  Negative for agitation and behavioral problems.       Objective:    Physical Exam Vitals reviewed.  Constitutional:      General: He is not in acute distress.    Appearance: He is not diaphoretic.  HENT:     Head: Normocephalic and atraumatic.     Nose: Congestion present.     Mouth/Throat:     Mouth: Mucous membranes are moist.  Eyes:     General: No scleral icterus.    Extraocular Movements: Extraocular movements intact.  Cardiovascular:     Rate and Rhythm: Normal rate and regular rhythm.     Heart sounds: Normal heart sounds. No murmur heard. Pulmonary:      Breath sounds: Normal breath sounds. No wheezing or rales.  Musculoskeletal:     Cervical back: Neck supple. No tenderness.     Right lower leg: No edema.     Left lower leg: No edema.  Skin:    General: Skin is warm.     Findings: No rash.  Neurological:     General: No focal deficit present.     Mental Status: He is alert and oriented to person, place, and time.     Sensory: No sensory deficit.     Motor: No weakness.  Psychiatric:        Mood and Affect: Mood normal.        Behavior: Behavior normal.     BP 138/72 (BP Location: Left Arm)   Pulse 76   Ht 5' 7 (1.702 m)   Wt 157 lb 12.8 oz (71.6 kg)   SpO2 98%   BMI 24.71 kg/m  Wt Readings from Last 3 Encounters:  05/15/24 157 lb 12.8 oz (71.6 kg)  02/09/24 161 lb (73 kg)  11/06/23 160 lb 12.8 oz (72.9 kg)    Lab Results  Component Value Date   TSH 3.780 11/06/2023   Lab Results  Component Value Date   WBC 6.5 02/09/2024   HGB 13.2 02/09/2024   HCT 39.0 02/09/2024   MCV 94.4 02/09/2024   PLT 145 (L) 02/09/2024   Lab Results  Component Value Date   NA 125 (L) 02/09/2024   K 4.4 02/09/2024   CO2 19 (L) 02/09/2024   GLUCOSE 113 (H) 02/09/2024   BUN 17 02/09/2024   CREATININE 2.08 (H) 02/09/2024   BILITOT 0.9 02/09/2024   ALKPHOS 67 02/09/2024   AST 29 02/09/2024   ALT 23 02/09/2024   PROT 6.5 02/09/2024   ALBUMIN 3.9 02/09/2024   CALCIUM  8.7 (L) 02/09/2024   ANIONGAP 9 02/09/2024   EGFR 42 (L) 11/06/2023   Lab Results  Component Value Date  CHOL 151 11/06/2023   Lab Results  Component Value Date   HDL 84 11/06/2023   Lab Results  Component Value Date   LDLCALC 55 11/06/2023   Lab Results  Component Value Date   TRIG 57 11/06/2023   Lab Results  Component Value Date   CHOLHDL 1.8 11/06/2023   Lab Results  Component Value Date   HGBA1C 4.5 (L) 11/06/2023      Assessment & Plan:   Problem List Items Addressed This Visit       Cardiovascular and Mediastinum   Essential  hypertension - Primary   BP Readings from Last 1 Encounters:  05/15/24 138/72   Well-controlled with Amlodipine  10 mg QD and losartan  25 mg QD now On losartan  for proteinuria Counseled for compliance with the medications Advised DASH diet and moderate exercise/walking, at least 150 mins/week      Relevant Medications   atorvastatin  (LIPITOR) 40 MG tablet   amLODipine  (NORVASC ) 10 MG tablet   terazosin  (HYTRIN ) 2 MG capsule   Aortic atherosclerosis (HCC)   Noted noted on CT chest On statin      Relevant Medications   atorvastatin  (LIPITOR) 40 MG tablet   amLODipine  (NORVASC ) 10 MG tablet   terazosin  (HYTRIN ) 2 MG capsule     Respiratory   Pulmonary emphysema (HCC)   Has mild dyspnea upon exertion Has albuterol  inhaler as needed for dyspnea and wheezing Needs to quit smoking If frequent need of albuterol  inhaler, plan to add maintenance inhaler        Nervous and Auditory   Post herpetic neuralgia   Well-controlled On Gabapentin  300 mg qHS      Relevant Medications   gabapentin  (NEURONTIN ) 300 MG capsule     Genitourinary   AKI on CKD (chronic kidney disease) stage 3b   Last CMP showed worsening of GFR during ER visit, but had diarrhea at the time Check BMP and urine microalbumin/creatinine ratio Avoid nephrotoxic agents Has proteinuria - on losartan  now Needs to improve hydration      Relevant Orders   Basic Metabolic Panel (BMET)   CBC   Urine Microalbumin w/creat. ratio   Benign prostatic hyperplasia with lower urinary tract symptoms   Well controlled On Terazosin       Relevant Medications   terazosin  (HYTRIN ) 2 MG capsule     Other   Tobacco abuse   Smokes about 0.5 pack/day now, cut down from 1 pack/day  Asked about quitting: confirms that he currently smokes cigarettes Advise to quit smoking: Educated about QUITTING to reduce the risk of cancer, cardio and cerebrovascular disease. Assess willingness: Unwilling to quit at this time, but is  working on cutting back. Assist with counseling and pharmacotherapy: Counseled for 5 minutes and literature provided. Did not tolerate Chantix . Arrange for follow up: Follow up in 3 months and continue to offer help.      Mixed hyperlipidemia   On Lipitor Check lipid profile      Relevant Medications   atorvastatin  (LIPITOR) 40 MG tablet   amLODipine  (NORVASC ) 10 MG tablet   terazosin  (HYTRIN ) 2 MG capsule      Meds ordered this encounter  Medications   atorvastatin  (LIPITOR) 40 MG tablet    Sig: Take 1 tablet (40 mg total) by mouth daily.    Dispense:  100 tablet    Refill:  2   amLODipine  (NORVASC ) 10 MG tablet    Sig: Take 1 tablet (10 mg total) by mouth daily.    Dispense:  100 tablet    Refill:  2   gabapentin  (NEURONTIN ) 300 MG capsule    Sig: Take 1 capsule (300 mg total) by mouth at bedtime.    Dispense:  100 capsule    Refill:  2   terazosin  (HYTRIN ) 2 MG capsule    Sig: Take 1 capsule (2 mg total) by mouth at bedtime.    Dispense:  100 capsule    Refill:  2    Follow-up: Return in about 6 months (around 11/14/2024) for Annual physical.    Meldon Sport, MD

## 2024-05-15 NOTE — Assessment & Plan Note (Signed)
 BP Readings from Last 1 Encounters:  05/15/24 138/72   Well-controlled with Amlodipine  10 mg QD and losartan  25 mg QD now On losartan  for proteinuria Counseled for compliance with the medications Advised DASH diet and moderate exercise/walking, at least 150 mins/week

## 2024-05-15 NOTE — Assessment & Plan Note (Signed)
 Has mild dyspnea upon exertion Has albuterol inhaler as needed for dyspnea and wheezing Needs to quit smoking If frequent need of albuterol inhaler, plan to add maintenance inhaler

## 2024-05-15 NOTE — Assessment & Plan Note (Signed)
Noted noted on CT chest On statin

## 2024-05-15 NOTE — Assessment & Plan Note (Addendum)
 Last CMP showed worsening of GFR during ER visit, but had diarrhea at the time Check BMP and urine microalbumin/creatinine ratio Avoid nephrotoxic agents Has proteinuria - on losartan  now Needs to improve hydration

## 2024-05-15 NOTE — Patient Instructions (Signed)
 Please continue to take medications as prescribed.  Please continue to follow low salt diet and perform moderate exercise/walking at least 150 mins/week.  Please maintain at least 64 ounces of fluid intake in a day.

## 2024-05-15 NOTE — Assessment & Plan Note (Signed)
 Well-controlled On Gabapentin  300 mg qHS

## 2024-05-15 NOTE — Assessment & Plan Note (Signed)
Smokes about 0.5 pack/day now, cut down from 1 pack/day  Asked about quitting: confirms that he currently smokes cigarettes Advise to quit smoking: Educated about QUITTING to reduce the risk of cancer, cardio and cerebrovascular disease. Assess willingness: Unwilling to quit at this time, but is working on cutting back. Assist with counseling and pharmacotherapy: Counseled for 5 minutes and literature provided. Did not tolerate Chantix. Arrange for follow up: Follow up in 3 months and continue to offer help.

## 2024-05-15 NOTE — Assessment & Plan Note (Signed)
Well controlled On Terazosin

## 2024-05-15 NOTE — Assessment & Plan Note (Signed)
On Lipitor Check lipid profile 

## 2024-05-16 ENCOUNTER — Ambulatory Visit: Payer: Self-pay | Admitting: Internal Medicine

## 2024-05-16 LAB — BASIC METABOLIC PANEL WITH GFR
BUN/Creatinine Ratio: 8 — ABNORMAL LOW (ref 10–24)
BUN: 13 mg/dL (ref 8–27)
CO2: 18 mmol/L — ABNORMAL LOW (ref 20–29)
Calcium: 9.5 mg/dL (ref 8.6–10.2)
Chloride: 98 mmol/L (ref 96–106)
Creatinine, Ser: 1.72 mg/dL — ABNORMAL HIGH (ref 0.76–1.27)
Glucose: 99 mg/dL (ref 70–99)
Potassium: 4.6 mmol/L (ref 3.5–5.2)
Sodium: 132 mmol/L — ABNORMAL LOW (ref 134–144)
eGFR: 41 mL/min/{1.73_m2} — ABNORMAL LOW (ref >59)

## 2024-05-16 LAB — CBC
Hematocrit: 44.2 % (ref 37.5–51.0)
Hemoglobin: 14.2 g/dL (ref 13.0–17.7)
MCH: 31.4 pg (ref 26.6–33.0)
MCHC: 32.1 g/dL (ref 31.5–35.7)
MCV: 98 fL — ABNORMAL HIGH (ref 79–97)
Platelets: 258 10*3/uL (ref 150–450)
RBC: 4.52 x10E6/uL (ref 4.14–5.80)
RDW: 11.7 % (ref 11.6–15.4)
WBC: 12.7 10*3/uL — ABNORMAL HIGH (ref 3.4–10.8)

## 2024-05-17 ENCOUNTER — Other Ambulatory Visit: Payer: Self-pay | Admitting: Internal Medicine

## 2024-05-17 DIAGNOSIS — N1832 Chronic kidney disease, stage 3b: Secondary | ICD-10-CM

## 2024-05-17 LAB — MICROALBUMIN / CREATININE URINE RATIO
Creatinine, Urine: 51.3 mg/dL
Microalb/Creat Ratio: 288 mg/g{creat} — ABNORMAL HIGH (ref 0–29)
Microalbumin, Urine: 147.5 ug/mL

## 2024-05-24 ENCOUNTER — Other Ambulatory Visit: Payer: Self-pay | Admitting: Internal Medicine

## 2024-05-24 DIAGNOSIS — I10 Essential (primary) hypertension: Secondary | ICD-10-CM

## 2024-07-24 ENCOUNTER — Ambulatory Visit

## 2024-07-24 ENCOUNTER — Other Ambulatory Visit: Payer: Self-pay

## 2024-07-24 VITALS — BP 136/86 | Ht 67.0 in | Wt 160.0 lb

## 2024-07-24 DIAGNOSIS — Z0001 Encounter for general adult medical examination with abnormal findings: Secondary | ICD-10-CM | POA: Diagnosis not present

## 2024-07-24 DIAGNOSIS — Z122 Encounter for screening for malignant neoplasm of respiratory organs: Secondary | ICD-10-CM

## 2024-07-24 DIAGNOSIS — F17210 Nicotine dependence, cigarettes, uncomplicated: Secondary | ICD-10-CM

## 2024-07-24 DIAGNOSIS — Z87891 Personal history of nicotine dependence: Secondary | ICD-10-CM

## 2024-07-24 DIAGNOSIS — Z0000 Encounter for general adult medical examination without abnormal findings: Secondary | ICD-10-CM

## 2024-07-24 NOTE — Progress Notes (Signed)
 Subjective:   Eddie [name] is a 78 y.o. who presents for a Medicare Wellness preventive visit.  As a reminder, Annual Wellness Visits don't include a physical exam, and some assessments may be limited, especially if this visit is performed virtually. We may recommend an in-person follow-up visit with your provider if needed.  Visit Complete: Virtual I connected with  Eddie [name] on 07/24/24 by a audio enabled telemedicine application and verified that I am speaking with the correct person using two identifiers.  Patient Location: Home  Provider Location: Office/Clinic  I discussed the limitations of evaluation and management by telemedicine. The patient expressed understanding and agreed to proceed.  Vital Signs: Because this visit was a virtual/telehealth visit, some criteria may be missing or patient reported. Any vitals not documented were not able to be obtained and vitals that have been documented are patient reported.  VideoDeclined- This patient declined Librarian, academic. Therefore the visit was completed with audio only.  Persons Participating in Visit: Patient.  AWV Questionnaire: No: Patient Medicare AWV questionnaire was not completed prior to this visit.  Cardiac Risk Factors include: advanced age (>37men, >53 women);dyslipidemia;hypertension;male gender;smoking/ tobacco exposure     Objective:    Today's Vitals   07/24/24 0912  BP: 136/86  Weight: 160 lb (72.6 kg)  Height: 5' 7 (1.702 m)   Body mass index is 25.06 kg/m.     07/24/2024    9:14 AM 02/09/2024    9:05 AM 11/15/2022    3:42 PM 10/22/2021   10:56 AM 03/25/2021    7:44 AM 10/20/2020    3:00 PM 10/22/2019    3:15 PM  Advanced Directives  Does Patient Have a Medical Advance Directive? No No No No No No No  Would patient like information on creating a medical advance directive? Yes (MAU/Ambulatory/Procedural Areas - Information given) No - Patient declined No - Patient  declined Yes (MAU/Ambulatory/Procedural Areas - Information given) No - Patient declined Yes (MAU/Ambulatory/Procedural Areas - Information given) No - Patient declined    Current Medications (verified) Outpatient Encounter Medications as of 07/24/2024  Medication Sig   acetaminophen  (TYLENOL ) 325 MG tablet Take 2 tablets (650 mg total) by mouth every 4 (four) hours as needed.   albuterol  (VENTOLIN  HFA) 108 (90 Base) MCG/ACT inhaler Inhale 2 puffs into the lungs every 4 (four) hours as needed for wheezing or shortness of breath.   amLODipine  (NORVASC ) 10 MG tablet Take 1 tablet (10 mg total) by mouth daily.   atorvastatin  (LIPITOR) 40 MG tablet Take 1 tablet (40 mg total) by mouth daily.   azelastine  (ASTELIN ) 0.1 % nasal spray PLACE 2 SPRAYS INTO BOTH NOSTRILS 2 TIMES A DAY   gabapentin  (NEURONTIN ) 300 MG capsule Take 1 capsule (300 mg total) by mouth at bedtime.   losartan  (COZAAR ) 25 MG tablet Take 1 tablet (25 mg total) by mouth daily.   terazosin  (HYTRIN ) 2 MG capsule Take 1 capsule (2 mg total) by mouth at bedtime.   vitamin B-12 (CYANOCOBALAMIN) 500 MCG tablet Take 500 mcg by mouth in the morning.   No facility-administered encounter medications on file as of 07/24/2024.    Allergies (verified) Motrin [ibuprofen]   History: Past Medical History:  Diagnosis Date   Chronic back pain    Duodenal ulcer 09/2012   GERD (gastroesophageal reflux disease)    Helicobacter pylori (H. pylori) infection 09/2012   Hypertension    Severe malnutrition (HCC) 10/26/2012   Squamous cell carcinoma Oct 2014  Esophogus   Squamous cell carcinoma of esophagus (HCC) 09/25/2012   Past Surgical History:  Procedure Laterality Date   COLONOSCOPY N/A 12/26/2013   Procedure: COLONOSCOPY;  Surgeon: Claudis RAYMOND Rivet, MD;  Location: [facility];  Service: Endoscopy;  Laterality: N/A;  830   COLONOSCOPY N/A 03/25/2021   Procedure: COLONOSCOPY;  Surgeon: Rivet Claudis RAYMOND, MD;  Location: [facility];   Service: Endoscopy;  Laterality: N/A;  AM   ESOPHAGOGASTRODUODENOSCOPY  09/21/2012   Procedure: ESOPHAGOGASTRODUODENOSCOPY (EGD);  Surgeon: Claudis RAYMOND Rivet, MD;  Location: [facility];  Service: Endoscopy;  Laterality: N/A;  345   PEG PLACEMENT  09/21/2012   Procedure: PERCUTANEOUS ENDOSCOPIC GASTROSTOMY (PEG) PLACEMENT;  Surgeon: Claudis RAYMOND Rivet, MD;  Location: [facility];  Service: Endoscopy;  Laterality: N/A;   PEG TUBE PLACEMENT  09/21/2012   PORTACATH PLACEMENT     REMOVAL OF GASTROSTOMY TUBE     august 2014   Family History  Problem Relation Age of Onset   Stroke Mother    Hypertension Father    Cancer Brother        Throat cancer   Kidney disease Daughter    Social History   Socioeconomic History   Marital status: Widowed    Spouse name: Not on file   Number of children: 4   Years of education: 10   Highest education level: Not on file  Occupational History   Not on file  Tobacco Use   Smoking status: Every Day    Current packs/day: 1.00    Average packs/day: 1 pack/day for 61.6 years (61.6 ttl pk-yrs)    Types: Cigarettes    Start date: 1964    Passive exposure: Current   Smokeless tobacco: Never   Tobacco comments:    1/2 to 1 pack x50 yrs.  Substance and Sexual Activity   Alcohol use: Yes    Alcohol/week: 6.0 standard drinks of alcohol    Types: 6 Cans of beer per week    Comment: Patient states he drinks a couple of beers almost daily in the afternoon,   Drug use: No   Sexual activity: Not on file  Other Topics Concern   Not on file  Social History Narrative   Patient lives with friend has 4 children   Social Drivers of Corporate investment banker Strain: Low Risk  (07/24/2024)   Overall Financial Resource Strain (CARDIA)    Difficulty of Paying Living Expenses: Not hard at all  Food Insecurity: No Food Insecurity (07/24/2024)   Hunger Vital Sign    Worried About Running Out of Food in the Last Year: Never true    Ran Out of Food in the Last  Year: Never true  Transportation Needs: No Transportation Needs (07/24/2024)   PRAPARE - Administrator, Civil Service (Medical): No    Lack of Transportation (Non-Medical): No  Physical Activity: Sufficiently Active (07/24/2024)   Exercise Vital Sign    Days of Exercise per Week: 7 days    Minutes of Exercise per Session: 30 min  Stress: No Stress Concern Present (07/24/2024)   [employer] of Occupational Health - Occupational Stress Questionnaire    Feeling of Stress: Not at all  Social Connections: Socially Isolated (07/24/2024)   Social Connection and Isolation Panel    Frequency of Communication with Friends and Family: Twice a week    Frequency of Social Gatherings with Friends and Family: Never    Attends Religious Services: Never  Active Member of Clubs or Organizations: No    Attends Banker Meetings: Never    Marital Status: Married    Tobacco Counseling Ready to quit: Yes Counseling given: Yes Tobacco comments: 1/2 to 1 pack x50 yrs.    Clinical Intake:  Pre-visit preparation completed: Yes  Pain : No/denies pain     BMI - recorded: 25.06 Nutritional Status: BMI 25 -29 Overweight Nutritional Risks: None Diabetes: No  Lab Results  Component Value Date   HGBA1C 4.5 (L) 11/06/2023   HGBA1C 4.6 (L) 10/31/2022   HGBA1C 4.4 (L) 10/22/2021     How often do you need to have someone help you when you read instructions, pamphlets, or other written materials from your doctor or pharmacy?: 1 - Never  Interpreter Needed?: No  Information entered by :: Amdrew Oboyle W CMA (AAMA)   Activities of Daily Living     07/24/2024    9:15 AM  In your present state of health, do you have any difficulty performing the following activities:  Hearing? 0  Vision? 0  Difficulty concentrating or making decisions? 0  Walking or climbing stairs? 0  Dressing or bathing? 0  Doing errands, shopping? 0  Preparing Food and eating ? N  Using the Toilet? N   In the past six months, have you accidently leaked urine? N  Do you have problems with loss of bowel control? N  Managing your Medications? N  Managing your Finances? N  Housekeeping or managing your Housekeeping? N    Patient Care Team: Tobie Suzzane POUR, MD as PCP - General (Internal Medicine) Nicholaus Sherlean CROME, [facility] (Inactive) as Pharmacist (Pharmacist) Hope Almarie ORN, NP as Nurse Practitioner (Pulmonary Disease) Mariette Mitzie CROME, NP as Nurse Practitioner (Gastroenterology) Matilda Senior, MD as Consulting Physician (Urology)  I have updated your Care Teams any recent Medical Services you may have received from other providers in the past year.     Assessment:   This is a routine wellness examination for Eddie [name].  Hearing/Vision screen Hearing Screening - Comments:: Patient denies any hearing difficulties.   Vision Screening - Comments:: Wears glasses and is not up to date on yearly eye exams. Resources provided so that he can establish with an eye care provider.    Goals Addressed               This Visit's Progress     Stay Active and Independent (pt-stated)        I want to remain active, healthy, and as independent as I can.        Depression Screen     07/24/2024    9:22 AM 05/15/2024    3:45 PM 11/06/2023    1:03 PM 07/04/2023    9:36 AM 03/03/2023   10:54 AM 11/15/2022    3:42 PM 11/15/2022    3:41 PM  PHQ 2/9 Scores  PHQ - 2 Score 0 0 0 0 0 0 0  PHQ- 9 Score  0 0        Fall Risk     07/24/2024    9:20 AM 05/15/2024    3:45 PM 11/06/2023    1:03 PM 07/04/2023    9:36 AM 03/03/2023   10:54 AM  Fall Risk   Falls in the past year? 0 0 0 0 0  Number falls in past yr: 0 0 0 0 0  Injury with Fall? 0 0 0 0 0  Risk for fall due to : No Fall Risks  No Fall Risks No Fall Risks    Follow up Falls evaluation completed;Education provided;Falls prevention discussed Falls evaluation completed Falls evaluation completed      MEDICARE RISK AT HOME:  Medicare  Risk at Home Any stairs in or around the home?: Yes If so, are there any without handrails?: No Home free of loose throw rugs in walkways, pet beds, electrical cords, etc?: Yes Adequate lighting in your home to reduce risk of falls?: No Life alert?: No Use of a cane, walker or w/c?: No Grab bars in the bathroom?: Yes Shower chair or bench in shower?: Yes Elevated toilet seat or a handicapped toilet?: Yes  TIMED UP AND GO:  Was the test performed?  No  Cognitive Function: 6CIT completed    10/22/2021   10:58 AM  MMSE - Mini Mental State Exam  Not completed: Unable to complete        07/24/2024    9:21 AM 11/15/2022    3:42 PM 10/22/2021   10:58 AM  6CIT Screen  What Year? 0 points 0 points 0 points  What month? 0 points 0 points 0 points  What time? 0 points 0 points 0 points  Count back from 20 0 points 0 points 0 points  Months in reverse 0 points 2 points 2 points  Repeat phrase 0 points 0 points 0 points  Total Score 0 points 2 points 2 points    Immunizations Immunization History  Administered Date(s) Administered   Fluad Quad(high Dose 65+) 10/20/2020, 10/27/2021, 10/31/2022   Fluad Trivalent(High Dose 65+) 11/06/2023   Influenza, High Dose Seasonal PF 09/19/2016, 09/26/2018, 09/11/2019   Influenza,inj,Quad PF,6+ Mos 11/12/2013, 09/09/2014, 11/18/2015, 09/07/2017   Influenza-Unspecified 08/12/2019   Moderna SARS-COV2 Booster Vaccination 01/25/2021   Moderna Sars-Covid-2 Vaccination 04/07/2020, 05/15/2020   Pneumococcal Conjugate-13 11/12/2013   Pneumococcal Polysaccharide-23 10/26/2012, 10/20/2020   Zoster Recombinant(Shingrix ) 01/14/2020, 03/16/2020    Screening Tests Health Maintenance  Topic Date Due   DTaP/Tdap/Td (1 - Tdap) Never done   COVID-19 Vaccine (3 - Moderna risk series) 02/22/2021   Lung Cancer Screening  12/03/2022   INFLUENZA VACCINE  07/05/2024   Medicare Annual Wellness (AWV)  07/24/2025   Colonoscopy  03/25/2028   Pneumococcal  Vaccine: 50+ Years  Completed   Hepatitis C Screening  Completed   Zoster Vaccines- Shingrix   Completed   HPV VACCINES  Aged Out   Meningococcal B Vaccine  Aged Out    Health Maintenance  Health Maintenance Due  Topic Date Due   DTaP/Tdap/Td (1 - Tdap) Never done   COVID-19 Vaccine (3 - Moderna risk series) 02/22/2021   Lung Cancer Screening  12/03/2022   INFLUENZA VACCINE  07/05/2024   Health Maintenance Items Addressed: Referral sent to [facility] [place] [facility] (smoker/hx smoking)  Additional Screening:  Vision Screening: Recommended annual ophthalmology exams for early detection of glaucoma and other disorders of the eye. Resources provided   Dental Screening: Recommended annual dental exams for proper oral hygiene  Community Resource Referral / Chronic Care Management: CRR required this visit?  No   CCM required this visit?  No   Plan:    I have personally reviewed and noted the following in the patient's chart:   Medical and social history Use of alcohol, tobacco or illicit drugs  Current medications and supplements including opioid prescriptions. Patient is not currently taking opioid prescriptions. Functional ability and status Nutritional status Physical activity Advanced directives List of other physicians Hospitalizations, surgeries, and ER visits in previous 12 months Vitals Screenings  to include cognitive, depression, and falls Referrals and appointments  In addition, I have reviewed and discussed with patient certain preventive protocols, quality metrics, and best practice recommendations. A written personalized care plan for preventive services as well as general preventive health recommendations were provided to patient.   Trina Asch, CMA   07/24/2024   After Visit Summary: (Mail) Due to this being a telephonic visit, the after visit summary with patients personalized plan was offered to patient via mail   Notes: Nothing significant to report at  this time.

## 2024-07-24 NOTE — Patient Instructions (Signed)
 Mr. Eppinger , Thank you for taking time out of your busy schedule to complete your Annual Wellness Visit with me. I enjoyed our conversation and look forward to speaking with you again next year. I, as well as your care team,  appreciate your ongoing commitment to your health goals. Please review the following plan we discussed and let me know if I can assist you in the future.  Your Game plan/ To Do List  Your goal for this year is:   Remain active, healthy and as independent as possible  Referrals: If you haven't heard from the office you've been referred to, please reach out to them at the phone provided.  Lung Cancer Screening-[place] Office [address]-First Floor Medical Building directly across from [facility] Phone Number:[phone]  Follow up Visits: We will see or speak with you next year for your Next Medicare AWV with our clinical staff July 28, 2025 at 2:30 pm telephone visit.   Clinician Recommendations:  Aim for 30 minutes of exercise or brisk walking, 6-8 glasses of water , and 5 servings of fruits and vegetables each day.    Wishing you many blessings and good health during the next year until our next visit.  -[place]   This is a list of the screenings recommended for you:  Health Maintenance  Topic Date Due   DTaP/Tdap/Td vaccine (1 - Tdap) Never done   COVID-19 Vaccine (3 - Moderna risk series) 02/22/2021   Screening for Lung Cancer  12/03/2022   Flu Shot  07/05/2024   Medicare Annual Wellness Visit  07/24/2025   Colon Cancer Screening  03/25/2028   Pneumococcal Vaccine for age over 31  Completed   Hepatitis C Screening  Completed   Zoster (Shingles) Vaccine  Completed   HPV Vaccine  Aged Out   Meningitis B Vaccine  Aged Out    Advanced directives: (Provided) Advance directive discussed with you today. I have provided a copy for you to complete at home and have notarized. Once this is complete, please bring a copy in to our office so we can scan it into  your chart.  Advance Care Planning is important because it:  [x]  Makes sure you receive the medical care that is consistent with your values, goals, and preferences  [x]  It provides guidance to your family and loved ones and reduces their decisional burden about whether or not they are making the right decisions based on your wishes.  Follow the link provided in your after visit summary or read over the paperwork we have mailed to you to help you started getting your Advance Directives in place. If you need assistance in completing these, please reach out to us  so that we can help you!  See attachments for Preventive Care and Fall Prevention Tips.

## 2024-08-07 DIAGNOSIS — N1832 Chronic kidney disease, stage 3b: Secondary | ICD-10-CM | POA: Diagnosis not present

## 2024-08-07 DIAGNOSIS — I129 Hypertensive chronic kidney disease with stage 1 through stage 4 chronic kidney disease, or unspecified chronic kidney disease: Secondary | ICD-10-CM | POA: Diagnosis not present

## 2024-08-07 DIAGNOSIS — R809 Proteinuria, unspecified: Secondary | ICD-10-CM | POA: Diagnosis not present

## 2024-08-07 DIAGNOSIS — E559 Vitamin D deficiency, unspecified: Secondary | ICD-10-CM | POA: Diagnosis not present

## 2024-08-08 ENCOUNTER — Other Ambulatory Visit: Payer: Self-pay | Admitting: Internal Medicine

## 2024-08-08 DIAGNOSIS — J302 Other seasonal allergic rhinitis: Secondary | ICD-10-CM

## 2024-11-18 ENCOUNTER — Encounter: Admitting: Internal Medicine

## 2024-11-22 ENCOUNTER — Other Ambulatory Visit: Payer: Self-pay | Admitting: Internal Medicine

## 2024-11-22 DIAGNOSIS — J302 Other seasonal allergic rhinitis: Secondary | ICD-10-CM

## 2024-12-31 ENCOUNTER — Encounter: Payer: Self-pay | Admitting: Internal Medicine

## 2025-01-29 ENCOUNTER — Encounter: Payer: Self-pay | Admitting: Internal Medicine

## 2025-07-28 ENCOUNTER — Ambulatory Visit

## 2025-11-18 ENCOUNTER — Ambulatory Visit
